# Patient Record
Sex: Female | Born: 1955 | Race: Black or African American | Hispanic: No | Marital: Married | Smoking: Former smoker
Health system: Southern US, Community
[De-identification: ages and names within clinical notes are randomized; demographics above are authoritative.]

## PROBLEM LIST (undated history)

## (undated) DIAGNOSIS — L309 Dermatitis, unspecified: Secondary | ICD-10-CM

## (undated) DIAGNOSIS — I4510 Unspecified right bundle-branch block: Secondary | ICD-10-CM

## (undated) DIAGNOSIS — Z973 Presence of spectacles and contact lenses: Secondary | ICD-10-CM

## (undated) DIAGNOSIS — E119 Type 2 diabetes mellitus without complications: Secondary | ICD-10-CM

## (undated) DIAGNOSIS — N3642 Intrinsic sphincter deficiency (ISD): Secondary | ICD-10-CM

## (undated) DIAGNOSIS — N393 Stress incontinence (female) (male): Secondary | ICD-10-CM

## (undated) DIAGNOSIS — C801 Malignant (primary) neoplasm, unspecified: Secondary | ICD-10-CM

## (undated) DIAGNOSIS — E785 Hyperlipidemia, unspecified: Secondary | ICD-10-CM

## (undated) DIAGNOSIS — Z972 Presence of dental prosthetic device (complete) (partial): Secondary | ICD-10-CM

## (undated) DIAGNOSIS — H269 Unspecified cataract: Secondary | ICD-10-CM

## (undated) DIAGNOSIS — I10 Essential (primary) hypertension: Secondary | ICD-10-CM

## (undated) HISTORY — DX: Hyperlipidemia, unspecified: E78.5

## (undated) HISTORY — PX: TUBAL LIGATION: SHX77

## (undated) HISTORY — PX: COLONOSCOPY: SHX174

## (undated) HISTORY — DX: Type 2 diabetes mellitus without complications: E11.9

## (undated) HISTORY — DX: Unspecified cataract: H26.9

## (undated) HISTORY — DX: Malignant (primary) neoplasm, unspecified: C80.1

## (undated) HISTORY — DX: Essential (primary) hypertension: I10

---

## 1996-11-14 HISTORY — PX: ABDOMINAL HYSTERECTOMY: SHX81

## 1997-11-14 HISTORY — PX: ABDOMINOPLASTY: SUR9

## 1998-08-27 ENCOUNTER — Other Ambulatory Visit: Admission: RE | Admit: 1998-08-27 | Discharge: 1998-08-27 | Payer: Self-pay | Admitting: Obstetrics

## 1998-09-21 ENCOUNTER — Encounter: Payer: Self-pay | Admitting: Obstetrics

## 1998-09-21 ENCOUNTER — Ambulatory Visit (HOSPITAL_COMMUNITY): Admission: RE | Admit: 1998-09-21 | Discharge: 1998-09-21 | Payer: Self-pay | Admitting: Obstetrics

## 1998-10-30 ENCOUNTER — Encounter: Payer: Self-pay | Admitting: Specialist

## 1998-10-30 ENCOUNTER — Ambulatory Visit (HOSPITAL_COMMUNITY): Admission: RE | Admit: 1998-10-30 | Discharge: 1998-10-31 | Payer: Self-pay | Admitting: Specialist

## 1999-10-14 ENCOUNTER — Ambulatory Visit (HOSPITAL_COMMUNITY): Admission: RE | Admit: 1999-10-14 | Discharge: 1999-10-14 | Payer: Self-pay | Admitting: Obstetrics

## 1999-10-14 ENCOUNTER — Encounter: Payer: Self-pay | Admitting: Obstetrics

## 1999-11-03 ENCOUNTER — Other Ambulatory Visit: Admission: RE | Admit: 1999-11-03 | Discharge: 1999-11-03 | Payer: Self-pay | Admitting: Obstetrics

## 1999-12-03 ENCOUNTER — Ambulatory Visit (HOSPITAL_COMMUNITY): Admission: RE | Admit: 1999-12-03 | Discharge: 1999-12-03 | Payer: Self-pay | Admitting: Obstetrics

## 1999-12-03 ENCOUNTER — Encounter: Payer: Self-pay | Admitting: Obstetrics

## 2000-01-05 ENCOUNTER — Ambulatory Visit (HOSPITAL_BASED_OUTPATIENT_CLINIC_OR_DEPARTMENT_OTHER): Admission: RE | Admit: 2000-01-05 | Discharge: 2000-01-05 | Payer: Self-pay | Admitting: Specialist

## 2000-01-05 ENCOUNTER — Encounter (INDEPENDENT_AMBULATORY_CARE_PROVIDER_SITE_OTHER): Payer: Self-pay | Admitting: Specialist

## 2000-01-05 HISTORY — PX: OTHER SURGICAL HISTORY: SHX169

## 2003-08-27 ENCOUNTER — Emergency Department (HOSPITAL_COMMUNITY): Admission: EM | Admit: 2003-08-27 | Discharge: 2003-08-27 | Payer: Self-pay | Admitting: Emergency Medicine

## 2003-12-04 ENCOUNTER — Ambulatory Visit (HOSPITAL_COMMUNITY): Admission: RE | Admit: 2003-12-04 | Discharge: 2003-12-04 | Payer: Self-pay | Admitting: Obstetrics

## 2006-01-14 ENCOUNTER — Emergency Department (HOSPITAL_COMMUNITY): Admission: EM | Admit: 2006-01-14 | Discharge: 2006-01-14 | Payer: Self-pay | Admitting: Emergency Medicine

## 2006-01-15 ENCOUNTER — Emergency Department (HOSPITAL_COMMUNITY): Admission: EM | Admit: 2006-01-15 | Discharge: 2006-01-15 | Payer: Self-pay | Admitting: Emergency Medicine

## 2006-04-09 ENCOUNTER — Inpatient Hospital Stay (HOSPITAL_COMMUNITY): Admission: EM | Admit: 2006-04-09 | Discharge: 2006-04-12 | Payer: Self-pay | Admitting: Emergency Medicine

## 2006-05-18 ENCOUNTER — Encounter: Admission: RE | Admit: 2006-05-18 | Discharge: 2006-08-16 | Payer: Self-pay | Admitting: Cardiology

## 2008-03-27 ENCOUNTER — Observation Stay (HOSPITAL_COMMUNITY): Admission: EM | Admit: 2008-03-27 | Discharge: 2008-03-28 | Payer: Self-pay | Admitting: Emergency Medicine

## 2008-03-27 HISTORY — PX: CARDIOVASCULAR STRESS TEST: SHX262

## 2008-09-11 ENCOUNTER — Ambulatory Visit (HOSPITAL_COMMUNITY): Admission: RE | Admit: 2008-09-11 | Discharge: 2008-09-11 | Payer: Self-pay | Admitting: Obstetrics

## 2008-11-14 DIAGNOSIS — C801 Malignant (primary) neoplasm, unspecified: Secondary | ICD-10-CM

## 2008-11-14 HISTORY — PX: COLON SURGERY: SHX602

## 2008-11-14 HISTORY — DX: Malignant (primary) neoplasm, unspecified: C80.1

## 2008-12-22 ENCOUNTER — Emergency Department (HOSPITAL_COMMUNITY): Admission: EM | Admit: 2008-12-22 | Discharge: 2008-12-22 | Payer: Self-pay | Admitting: Emergency Medicine

## 2008-12-24 ENCOUNTER — Ambulatory Visit (HOSPITAL_BASED_OUTPATIENT_CLINIC_OR_DEPARTMENT_OTHER): Admission: RE | Admit: 2008-12-24 | Discharge: 2008-12-25 | Payer: Self-pay | Admitting: Orthopedic Surgery

## 2008-12-24 HISTORY — PX: OTHER SURGICAL HISTORY: SHX169

## 2009-08-04 ENCOUNTER — Observation Stay (HOSPITAL_COMMUNITY): Admission: EM | Admit: 2009-08-04 | Discharge: 2009-08-06 | Payer: Self-pay | Admitting: Emergency Medicine

## 2009-08-05 ENCOUNTER — Encounter (INDEPENDENT_AMBULATORY_CARE_PROVIDER_SITE_OTHER): Payer: Self-pay | Admitting: Internal Medicine

## 2009-08-05 HISTORY — PX: TRANSTHORACIC ECHOCARDIOGRAM: SHX275

## 2010-03-12 ENCOUNTER — Ambulatory Visit (HOSPITAL_BASED_OUTPATIENT_CLINIC_OR_DEPARTMENT_OTHER): Admission: RE | Admit: 2010-03-12 | Discharge: 2010-03-12 | Payer: Self-pay | Admitting: Orthopedic Surgery

## 2010-03-12 HISTORY — PX: OTHER SURGICAL HISTORY: SHX169

## 2010-10-05 ENCOUNTER — Ambulatory Visit (HOSPITAL_COMMUNITY): Admission: RE | Admit: 2010-10-05 | Discharge: 2010-10-05 | Payer: Self-pay | Admitting: Obstetrics

## 2011-02-01 LAB — BASIC METABOLIC PANEL
BUN: 11 mg/dL (ref 6–23)
Calcium: 9.6 mg/dL (ref 8.4–10.5)
GFR calc non Af Amer: >60 mL/min (ref >60)
Glucose, Bld: 165 mg/dL — ABNORMAL HIGH (ref 70–99)
Potassium: 4.2 mEq/L (ref 3.5–5.1)
Sodium: 137 mEq/L (ref 135–145)

## 2011-02-01 LAB — GLUCOSE, CAPILLARY: Glucose-Capillary: 173 mg/dL — ABNORMAL HIGH (ref 70–99)

## 2011-02-18 LAB — RAPID URINE DRUG SCREEN, HOSP PERFORMED
Barbiturates: NOT DETECTED
Benzodiazepines: NOT DETECTED
Cocaine: NOT DETECTED
Opiates: NOT DETECTED

## 2011-02-18 LAB — POCT I-STAT, CHEM 8
BUN: 11 mg/dL (ref 6–23)
Creatinine, Ser: 0.4 mg/dL (ref 0.4–1.2)
Glucose, Bld: 215 mg/dL — ABNORMAL HIGH (ref 70–99)
Potassium: 4.2 mEq/L (ref 3.5–5.1)
Sodium: 140 mEq/L (ref 135–145)

## 2011-02-18 LAB — CBC
MCHC: 33.4 g/dL (ref 30.0–36.0)
MCV: 90.9 fL (ref 78.0–100.0)
Platelets: 235 10*3/uL (ref 150–400)
WBC: 8.5 10*3/uL (ref 4.0–10.5)

## 2011-02-18 LAB — GLUCOSE, CAPILLARY
Glucose-Capillary: 136 mg/dL — ABNORMAL HIGH (ref 70–99)
Glucose-Capillary: 164 mg/dL — ABNORMAL HIGH (ref 70–99)
Glucose-Capillary: 167 mg/dL — ABNORMAL HIGH (ref 70–99)

## 2011-02-18 LAB — CK TOTAL AND CKMB (NOT AT ARMC)
CK, MB: 1 ng/mL (ref 0.3–4.0)
Relative Index: 0.9 (ref 0.0–2.5)

## 2011-02-18 LAB — BASIC METABOLIC PANEL
CO2: 27 mEq/L (ref 19–32)
GFR calc non Af Amer: >60 mL/min (ref >60)
Glucose, Bld: 152 mg/dL — ABNORMAL HIGH (ref 70–99)
Potassium: 4.4 mEq/L (ref 3.5–5.1)
Sodium: 140 mEq/L (ref 135–145)

## 2011-02-18 LAB — COMPREHENSIVE METABOLIC PANEL
AST: 19 U/L (ref 0–37)
Albumin: 3.6 g/dL (ref 3.5–5.2)
Calcium: 9.3 mg/dL (ref 8.4–10.5)
Creatinine, Ser: 0.76 mg/dL (ref 0.4–1.2)
GFR calc Af Amer: >60 mL/min (ref >60)
Total Protein: 7.4 g/dL (ref 6.0–8.3)

## 2011-02-18 LAB — LIPID PANEL
HDL: 35 mg/dL — ABNORMAL LOW (ref >39)
Triglycerides: 127 mg/dL (ref <150)
VLDL: 25 mg/dL (ref 0–40)

## 2011-02-18 LAB — HEMOGLOBIN A1C
Hgb A1c MFr Bld: 7.6 % — ABNORMAL HIGH (ref 4.6–6.1)
Mean Plasma Glucose: 171 mg/dL

## 2011-02-18 LAB — TSH: TSH: 0.725 u[IU]/mL (ref 0.350–4.500)

## 2011-02-18 LAB — CARDIAC PANEL(CRET KIN+CKTOT+MB+TROPI)
CK, MB: 0.7 ng/mL (ref 0.3–4.0)
Relative Index: 0.8 (ref 0.0–2.5)

## 2011-02-18 LAB — TROPONIN I: Troponin I: 0.04 ng/mL (ref 0.00–0.06)

## 2011-03-01 LAB — POCT I-STAT, CHEM 8
Calcium, Ion: 1.21 mmol/L (ref 1.12–1.32)
Chloride: 102 mEq/L (ref 96–112)
HCT: 39 % (ref 36.0–46.0)
Hemoglobin: 13.3 g/dL (ref 12.0–15.0)

## 2011-03-01 LAB — GLUCOSE, CAPILLARY
Glucose-Capillary: 120 mg/dL — ABNORMAL HIGH (ref 70–99)
Glucose-Capillary: 168 mg/dL — ABNORMAL HIGH (ref 70–99)

## 2011-03-29 NOTE — Discharge Summary (Signed)
Gabrielle [name], [name]              ACCOUNT NO.:  [id number]   MEDICAL RECORD NO.:  [id number]          PATIENT TYPE:  INP   LOCATION:  4741                         FACILITY:  [facility]   PHYSICIAN:  Mohan N. Sharyn Lull, M.D. DATE OF BIRTH:  1956/07/11   DATE OF ADMISSION:  03/27/2008  DATE OF DISCHARGE:  03/28/2008                               DISCHARGE SUMMARY   ADMITTING DIAGNOSES:  1. Chest pain rule out myocardial infarction.  2. Hypertension.  3. Insulin-requiring diabetes mellitus.  4. Hypercholesteremia.  5. History of tobacco abuse.   FINAL DIAGNOSES:  1. Status post chest pain, myocardial infarction ruled out, negative      Persantine Myoview.  2. Rule out gastroesophageal reflux disease.  3. Hypertension.  4. Insulin-requiring diabetes mellitus.  5. Hypercholesteremia.  6. History of tobacco abuse.   DISCHARGE HOME MEDICATIONS:  1. Enteric-coated aspirin 81 mg 1 tablet daily.  2. Cozaar 100 mg 1 tablet daily.  3. Metformin 500 mg 1 tablet twice daily with food.  4. Lantus insulin 15 units at night as before.  5. Simcor 500/20 one tablet twice daily.  6. Nitrostat 0.4 mg sublingual use as directed.  7. Prilosec 20 mg 1 daily.   DIET:  Low salt, low-cholesterol, 1800 calories ADA diet.   ACTIVITY:  As tolerated.   FOLLOWUP:  Follow up With Dr. Shana Chute in 1 week.   CONDITION ON DISCHARGE:  Stable.   BRIEF HISTORY AND HOSPITAL COURSE:  [name] is 55 year old black  female with past medical history significant for hypertension, insulin-  requiring diabetes mellitus, hypercholesteremia, history of tobacco  abuse.  She came to the ER from the primary care complaining of  retrosternal chest pressure grade 9/10 associated with nausea and mild  shortness of breath.  Chest pain was localized while at work, received 3  sublingual nitro at primary care with partial relief.  EKG showed normal  sinus rhythm with right bundle branch block pattern and was transferred  here  for further evaluation.  The patient denies any history of such  pain in the past.  Denies exertional chest pain but complains of  exertional dyspnea.  Denies any palpitation, lightheadedness, or  syncope.  Denies PND, orthopnea, leg swelling.  Denies relation of chest  pain to food, breathing, or movement.   PAST MEDICAL HISTORY:  As above.   PAST SURGICAL HISTORY:  She had hysterectomy in the past, had  liposuction in the past.   ALLERGIES:  NO KNOWN DRUG ALLERGIES.   MEDICATION:  At home, she is on Cozaar 100 mg p.o. daily, Vytorin 10/20  p.o. daily, metformin 500 mg p.o. b.i.d., Lantus insulin 15 units daily  at night.   SOCIAL HISTORY:  She is married, has 1 child.  Smoked 1 pack per day for  30+ years, quit last year.  No history of alcohol or drug abuse.  Works  at [employer], is in Architect.   FAMILY HISTORY:  Father is alive, he is 43; mother is alive, she is  diabetic in her 18s.   PHYSICAL EXAMINATION:  GENERAL:  On  examination, she is alert, awake,  oriented x3 in no acute distress.  VITAL SIGNS:  Blood pressure was 139/79, pulse was 71 regular.  HEENT:  Conjunctivae was pink.  NECK:  Supple.  No JVD.  No bruit.  LUNGS:  Clear to auscultation without rhonchi or rales.  CARDIOVASCULAR:  S1 and S2 was normal.  There was soft systolic murmur.  There was no S3 gallop.  ABDOMEN:  Soft.  Bowel sounds are present, nontender.  EXTREMITIES:  There is no clubbing, cyanosis, or edema.   LABORATORY DATA:  EKG showed normal sinus rhythm with right bundle  branch block pattern and nonspecific ST-T wave changes.  Her 2 sets of  cardiac enzymes were negative.  Hemoglobin was 11.9, hematocrit 35.7,  white count 10.3.  Cholesterol was 136, triglyceride 299, HDL was low at  23, LDL 53, potassium this morning was 3.5, BUN 12, creatinine 0.90,  glucose 135, hemoglobin A1c was 6.8.   BRIEF HOSPITAL COURSE:  The patient was admitted to telemetry  unit.  She  was started on IV heparin and nitrates and aspirin and low-dose beta-  blockers.  The patient's MI was ruled out by serial enzymes and EKG.  The patient subsequently underwent Persantine Myoview, which showed no  evidence of reversible ischemia with normal EF.  The patient has been  ambulating in the hallway without any problems.  Her EKG remained  stable.  The patient will be discharged home on above medications and  will be followed up by Dr. Shana Chute in 1 week.  We will get GI evaluation  if she continues to have recurrent chest pain.      Eduardo Osier. Sharyn Lull, M.D.  Electronically Signed     MNH/MEDQ  D:  03/28/2008  T:  03/29/2008  Job:  [id number]   cc:   Osvaldo Shipper. Spruill, M.D.

## 2011-03-29 NOTE — Op Note (Signed)
NAME:  Gabrielle [name], Gabrielle [name]              ACCOUNT NO.:  [id number]   MEDICAL RECORD NO.:  [id number]          PATIENT TYPE:  AMB   LOCATION:  DSC                          FACILITY:  [facility]   PHYSICIAN:  Harvie Junior, M.D.   DATE OF BIRTH:  1956-09-02   DATE OF PROCEDURE:  12/24/2008  DATE OF DISCHARGE:                               OPERATIVE REPORT   PREOPERATIVE DIAGNOSIS:  Ankle fracture dislocation, right, status post  closed reduction.   POSTOPERATIVE DIAGNOSIS:  Ankle fracture dislocation, right, status post  closed reduction.   PRINCIPAL PROCEDURE:  Open reduction and internal fixation of  bimalleolar ankle fracture with fixation of the lateral malleolus.   SURGEON:  Harvie Junior, M.D.   ASSISTANT:  Marshia Ly, P.A.   ANESTHESIA:  General.   BRIEF HISTORY:  Ms. Line is a 55 -year-old female with history of  having slip and fall.  She suffered a fracture dislocation of the ankle.  She was seen in the [facility] Emergency Room where they did a closed  reduction, which gave near anatomic reduction of the ankle.  The patient  was evaluated in our office and felt to have an obvious fracture  dislocation, unstable ankle, and because of this she was felt to need  open reduction and internal fixation.  She was brought to the operating  room for this procedure.   PROCEDURE:  The patient was brought to the operating room.  After  adequate anesthesia obtained with general anesthetic, the patient was  placed supine on the operating table.  The right leg was then prepped  and draped in usual sterile fashion.  The initial thing we did was  really look at length for medial malleolar issues.  All her injury films  and her postreduction films, we really could not see anything in the  medial malleolus, but we took the fluoro and really spent some time  evaluating the medial malleolus and it did not look to be fractured.  At  that point, we exsanguinated the leg and inflated the blood  pressure  tourniquet to 350 mmHg.  Following this, a lateral incision was made.  Subcutaneous tissue was taken down to the level of the fibula.  The  fibula was identified.  The fracture line was identified, cleansed of  all the initial healing elements.  Anatomic reduction was obtained, held  with the clamp.  We did an interfragmentary screw and then a lateral  five-hole plate and got anatomic fixation of lateral malleolus.  Then  again took fluoro images.  The screw lengths were fine.  The fibula was  reduced nicely.  Posterior malleolus reduced anatomically and again we  looked at length on the medial side and did not see any evidence of  medial malleolar fracture.  At this point, the wound  was copiously and  thoroughly irrigated and suctioned dry.  The skin was closed with 3-0  Vicryl and 4-0 nylon interrupted sutures.  Sterile compressive dressing  was applied as well as a U and a posterior splint.  The patient was  taken to the recovery room  and was noted to be in the satisfactory  condition.  Estimated blood loss throughout the procedure was none.   Of note, Marshia Ly, was present throughout the case as assistance in  manipulation of the ankle and holding of the alignment, so the screws  could be placed was invaluable.  He also closed the case to minimize the  operative time.      Harvie Junior, M.D.  Electronically Signed     JLG/MEDQ  D:  12/24/2008  T:  12/24/2008  Job:  [id number]

## 2011-04-01 NOTE — Op Note (Signed)
[place]  Patient:    Gabrielle [name], Gabrielle [name]                     MRN: [id number] Proc. Date: 01/05/00 Adm. Date:  [id number] Attending:  Gustavus Messing CC:         Yaakov Guthrie. Shon Hough, M.D. x 2                           Operative Report  PREOPERATIVE DIAGNOSIS:  POSTOPERATIVE DIAGNOSIS:  PROCEDURE:  Excision of scar cicatrix of the lower abdomen and then advancement  closure.  SURGEON:  Yaakov Guthrie. Shon Hough, M.D.  ANESTHESIA:  General.  INDICATIONS:  This is a 54 year old lady who is status post abdominoplasty several months ago.  Developed postoperative dehiscence of the lower part of the abdomen with ulceration secondary to cigarette smoking.  The patient went through a long drawn out period of dressing changes and allowed the wound to heal in secondarily. As a result, the wound shows now increased scar cicatrix, indentation, and deformity of the abdomen secondary to the previous secondary healing.  It has also caused increased discomfort, especially when she wears her clothing against the  area and when she sits.  DESCRIPTION OF PROCEDURE:  The patient underwent general anesthesia and intubated orally.  Prep was done to the abdominal area and groin using Betadine soap and solution, and walled off with sterile towels and drapes so as to make a sterile  field.  A marking pen was used to outline the scar cicatrix which measured approximately 6-8 cm in width and more than maybe 12 cm to 14 cm in length.  A ide excision was made down to underlying fascia.  Hemostasis was maintained with the Bovie unit on coagulation.  After proper hemostasis, the anterior flaps and superior flaps were freed up approximately 8 cm and the lower flap was also freed up to allow closure without increased tension with 2-0 Monocryl x 2 layers, and then a running subcuticular  stitch of 3-0 Monocryl.  Half inch Steri-Strips and silicone gel patch  were applied, Xeroform, 4 x 4s, ABDs, Hyperfix tape, and abdominal support.  She withstood the procedures very well and was taken to recovery in good condition. DD:  01/05/00 TD:  01/06/00 Job: [id number]

## 2011-04-01 NOTE — Discharge Summary (Signed)
NAMEDANELY, BAYLISS              ACCOUNT NO.:  [id number]   MEDICAL RECORD NO.:  [id number]          PATIENT TYPE:  INP   LOCATION:  1426                         FACILITY:  [facility]   PHYSICIAN:  Osvaldo Shipper. Spruill, M.D.DATE OF BIRTH:  1955/12/25   DATE OF ADMISSION:  04/09/2006  DATE OF DISCHARGE:  04/12/2006                                 DISCHARGE SUMMARY   AGE:  55.   ADMISSION DIAGNOSES:  1. Diabetic coma type 2 new onset.  2. Dehydration.  3. Hypertension.  4. Tobacco abuse.   DISCHARGE DIAGNOSES:  1. Uncontrolled type 2 diabetes.  2. Dehydration.  3. Hypertension.  4. Tobacco disorder.  5. Obesity.   BRIEF HISTORY:  This 55 year old black woman who has not been seen by me for  several years apparently was at home and she became weak, went to the  bathroom and passed out on the floor. The patient also reported that she had  been drinking water for the past 2 weeks prior to admission. She denied any  chest pain or abdominal pain. She has a past medical history of diabetes  diagnosed some 10-15 years ago but was not treated. She has also a history  of hypertension and a positive history of smoking. She came to the [facility] Emergency Room. She was brought into observation by Dr. Orpah Cobb in  my absence and subsequently admitted to the hospital for treatment new onset  diabetes. It was discovered in the emergency room that the patient's blood  sugar was greater than 710. She will be admitted to the hospital for further  evaluation and treatment.   LABORATORY DATA:  Laboratory studies revealed the following:  On initial  presentation urinalysis color was yellow hazy. Specific gravity of the urine  1.038, pH is 5.5, glucose greater than 1000, moderate amount of hemoglobin.  Ketones was positive, many epithelial cells also noted. The patient had a  urine culture that only revealed 50,000 colonies. In addition to that, lipid  profile obtained on Apr 12, 2006 revealed  a total cholesterol of 226,  triglycerides of 344, the low density lipoprotein cholesterol was 130.  Cardiac enzymes were obtained on May 26 and revealed a CK of 79, CK-MB of  0.9. Troponin was 0.07. The patient had the routine chemistry performed and  revealed an AST of 14, ALT of 17, ALP of 126, total bilirubin of 1.3. White  blood cell count on initial presentation was 10,700, it decreased on Apr 10, 2006 to 8400. Hemoglobin 15.4, hematocrit 12.7 at discharge. Serum sodium on  admission was 134, 135 at discharge. Potassium was 4.7 on admission, 3.4 at  discharge. Chloride 98 on admission, 103 at discharge. Glucose again was 710  on admission, near 298 at discharge. BUN and creatinine 13 and 1.2  respectively. The patient's radiologic studies included an unremarkable  chest x-ray and an EKG also revealed normal sinus rhythm with a right bundle  branch block and there had been no change since the previous EKG of 1999.   HOSPITAL COURSE:  The patient was admitted to the hospital by Dr.  Orpah Cobb in my absence. After admission to the hospital, baseline laboratory  studies were obtained and included markedly elevated blood sugar on initial  presentation. The patient was placed on sliding scale insulin coverage with  moderate insulin coverage. No nighttime coverage was requested. She was  given IV normal saline for hydration purposes. In addition to this, she was  started on Glucotrol XL 10 mg once daily, given Protonix 40 mg daily,  Tekturna 150 mg 1 daily, Glucophage 500 mg p.o. twice a day. Diabetic  teaching was also requested.   The patient had some minor difficulty with insomnia and was prescribed  Rozerem and also started on Lantus insulin 10 units subcu h.s.   Throughout her hospital course, her blood sugars decreased. She became  reacquainted with using insulin and her diet. It was felt the patient had  reached maximum hospital benefit. The patient will be discharged home  and  will be followed back in my office in approximately 2 weeks for control of  her diabetes.   In brief summary, this 55 year old black woman who lives alone who has a  history of diabetes untreated presented to the hospital in a coma with  ketoacidosis. She was treated effectively, put back on insulin and oral  agents and released home in good condition.   DISCHARGE MEDICATIONS:  1. Vytorin 10/20 mg p.o. daily.  2. Lantus insulin 15 units subcu q.p.m.  3. Glucotrol XL 10 mg p.o. q.a.m.  4. Cozaar 100 mg p.o. daily.  5. Glucophage 500 mg p.o. b.i.d.  6. Protonix 40 mg p.o. q.a.m.  7. Cipro 500 mg p.o. b.i.d.   She will be in the office in 2 weeks. She will be also on the moderate  carbohydrate restricted diet. She will have followup with diabetes  outpatient clinic.      Osvaldo Shipper. Spruill, M.D.  Electronically Signed     JOS/MEDQ  D:  07/18/2006  T:  07/19/2006  Job:  [id number]

## 2011-08-10 LAB — CARDIAC PANEL(CRET KIN+CKTOT+MB+TROPI)
CK, MB: 0.9
Relative Index: 0.8

## 2011-08-10 LAB — LIPID PANEL
Cholesterol: 136
HDL: 23 — ABNORMAL LOW
LDL Cholesterol: 53
Triglycerides: 299 — ABNORMAL HIGH

## 2011-08-10 LAB — BASIC METABOLIC PANEL
BUN: 12
CO2: 27
Calcium: 9.5
Creatinine, Ser: 0.9
GFR calc Af Amer: >60
Glucose, Bld: 135 — ABNORMAL HIGH

## 2011-08-10 LAB — CBC
HCT: 35.3 — ABNORMAL LOW
Hemoglobin: 11.9 — ABNORMAL LOW
MCHC: 33.6
MCV: 88.7
RBC: 3.98
WBC: 10.3

## 2011-11-14 ENCOUNTER — Ambulatory Visit (INDEPENDENT_AMBULATORY_CARE_PROVIDER_SITE_OTHER): Payer: BC Managed Care – PPO

## 2011-11-14 DIAGNOSIS — I10 Essential (primary) hypertension: Secondary | ICD-10-CM

## 2011-11-14 DIAGNOSIS — E782 Mixed hyperlipidemia: Secondary | ICD-10-CM

## 2011-11-14 DIAGNOSIS — E119 Type 2 diabetes mellitus without complications: Secondary | ICD-10-CM

## 2012-02-09 ENCOUNTER — Telehealth: Payer: Self-pay

## 2012-02-09 NOTE — Telephone Encounter (Signed)
Pt is requesting glucose strips be called into [facility] - states we sent it there - they have NO record of this at their site Please call patient and advise how to get her strips  [phone]

## 2012-02-14 NOTE — Telephone Encounter (Signed)
Checked with pt to verify what she needs and called in Rxs for BS glucose meter and testing strips of pt's choice - testing strips #100 plus two add'l RFs to [facility]

## 2012-02-14 NOTE — Telephone Encounter (Signed)
THIS IS THE SECOND CALL FROM PT. IS REQUESTING THE GLUCOSE STRIPS ALONG WITH THE MACHINE. HASN'T HEARD FROM ANYONE. PLEASE CALL [phone]   [facility] ON [address]

## 2012-02-28 ENCOUNTER — Ambulatory Visit (INDEPENDENT_AMBULATORY_CARE_PROVIDER_SITE_OTHER): Payer: BC Managed Care – PPO | Admitting: Family Medicine

## 2012-02-28 DIAGNOSIS — J301 Allergic rhinitis due to pollen: Secondary | ICD-10-CM

## 2012-02-28 DIAGNOSIS — E119 Type 2 diabetes mellitus without complications: Secondary | ICD-10-CM | POA: Insufficient documentation

## 2012-02-28 DIAGNOSIS — I10 Essential (primary) hypertension: Secondary | ICD-10-CM | POA: Insufficient documentation

## 2012-02-28 DIAGNOSIS — E7800 Pure hypercholesterolemia, unspecified: Secondary | ICD-10-CM | POA: Insufficient documentation

## 2012-02-28 LAB — POCT GLYCOSYLATED HEMOGLOBIN (HGB A1C): Hemoglobin A1C: 5.9

## 2012-02-28 MED ORDER — OLOPATADINE HCL 0.1 % OP SOLN: 1.0000 [drp] | Freq: Two times a day (BID) | OPHTHALMIC | Status: AC

## 2012-02-28 MED ORDER — FLUTICASONE PROPIONATE 50 MCG/ACT NA SUSP: 2.0000 | Freq: Every day | NASAL | Status: DC

## 2012-02-28 NOTE — Progress Notes (Signed)
  Patient Name: Gabrielle [name] Date of Birth: 05-22-1956 Medical Record Number: [id number] Gender: female Date of Encounter: 02/28/2012  History of Present Illness:  Gabrielle [name] is a 56 y.o. very pleasant female patient who presents with the following:  Here today with allergy symptoms- eyes running, sneezing, coughing.  No fever or aches, mild chills.  Runny and stuffy nose, itchy nose and throat.  No GI symptoms or other sign of illness.    So far she has not tried any medications for allergies.    Patient Active Problem List  Diagnoses  . Diabetes mellitus  . High cholesterol  . Hypertension   Past Medical History  Diagnosis Date  . Lactose intolerance    Past Surgical History  Procedure Date  . Abdominal hysterectomy 1998    fibroids  . Tummy tuck 1999  . Ankle repari 2010   History  Substance Use Topics  . Smoking status: Former Smoker    Quit date: 11/14/2006  . Smokeless tobacco: Never Used  . Alcohol Use: No   Family History  Problem Relation Age of Onset  . Diabetes Mother   . Diabetes Sister   . Diabetes Brother    No Known Allergies  Medication list has been reviewed and updated.  Review of Systems: As per HPI- otherwise negative.   Physical Examination: Filed Vitals:   02/28/12 1136  BP: 119/74  Pulse: 82  Temp: 98.4 F (36.9 C)  TempSrc: Oral  Resp: 18  Weight: 135 lb 9.6 oz (61.508 kg)    There is no height on file to calculate BMI.  GEN: WDWN, NAD, Non-toxic, A & O x 3 HEENT: Atraumatic, Normocephalic. Neck supple. No masses, No LAD.  Swelling around eyes/ allergic shiners.  TM, oropharynx wnl Ears and Nose: No external deformity. CV: RRR, No M/G/R. No JVD. No thrill. No extra heart sounds. PULM: CTA B, no wheezes, crackles, rhonchi. No retractions. No resp. distress. No accessory muscle use. ABD: S, NT, ND, +BS. No rebound. No HSM. EXTR: No c/c/e NEURO Normal gait.  PSYCH: Normally interactive. Conversant. Not depressed or  anxious appearing.  Calm demeanor.   Results for orders placed in visit on 02/28/12  POCT GLYCOSYLATED HEMOGLOBIN (HGB A1C)      Component Value Range   Hemoglobin A1C 5.9       Assessment and Plan: 1. Diabetes mellitus  POCT glycosylated hemoglobin (Hb A1C)  2. High cholesterol    3. Hypertension    4. Allergic rhinitis  olopatadine (PATANOL) 0.1 % ophthalmic solution, fluticasone (FLONASE) 50 MCG/ACT nasal spray   Treat AR as above- DM looks great.  Plan recheck here in about 2 months for fasting labs

## 2012-04-14 ENCOUNTER — Ambulatory Visit (INDEPENDENT_AMBULATORY_CARE_PROVIDER_SITE_OTHER): Payer: BC Managed Care – PPO | Admitting: Family Medicine

## 2012-04-14 VITALS — BP 116/76 | HR 84 | Temp 98.5°F | Resp 16 | Ht <= 58 in | Wt 138.2 lb

## 2012-04-14 DIAGNOSIS — R300 Dysuria: Secondary | ICD-10-CM

## 2012-04-14 DIAGNOSIS — L241 Irritant contact dermatitis due to oils and greases: Secondary | ICD-10-CM

## 2012-04-14 DIAGNOSIS — L309 Dermatitis, unspecified: Secondary | ICD-10-CM

## 2012-04-14 DIAGNOSIS — N390 Urinary tract infection, site not specified: Secondary | ICD-10-CM

## 2012-04-14 LAB — POCT UA - MICROSCOPIC ONLY
Casts, Ur, LPF, POC: NEGATIVE
Crystals, Ur, HPF, POC: NEGATIVE
Yeast, UA: NEGATIVE

## 2012-04-14 LAB — POCT URINALYSIS DIPSTICK
Glucose, UA: NEGATIVE
Nitrite, UA: NEGATIVE
Urobilinogen, UA: 1
pH, UA: 6

## 2012-04-14 MED ORDER — CLOBETASOL PROPIONATE 0.05 % EX OINT: TOPICAL_OINTMENT | Freq: Two times a day (BID) | CUTANEOUS | Status: DC

## 2012-04-14 MED ORDER — FLUCONAZOLE 150 MG PO TABS: 150.0000 mg | ORAL_TABLET | Freq: Once | ORAL | Status: DC

## 2012-04-14 MED ORDER — CIPROFLOXACIN HCL 250 MG PO TABS: 250.0000 mg | ORAL_TABLET | Freq: Two times a day (BID) | ORAL | Status: AC

## 2012-04-14 MED ORDER — CLOBETASOL PROP EMOLLIENT BASE 0.05 % EX CREA: TOPICAL_CREAM | CUTANEOUS | Status: DC

## 2012-04-14 NOTE — Progress Notes (Signed)
Patient Name: Gabrielle [name] Date of Birth: [birth year] Medical Record Number: [id number] Gender: female Date of Encounter: 04/14/2012  History of Present Illness:  Gabrielle [name] is a 56 y.o. very pleasant female patient who presents with the following:  Here today to evaluate a rash and dysuria. She has noted a "skin infection" on her fingers- she has a history of eczema and her dermatologist has retired.  She used clobetazole ointment AND cream in the past which helped. Her rash is currently flared up as she has been working in water a lot  She is also concerned about dysuria, urgency,and frequency.  These symptoms have been going on for a couple of days.  No fever, nausea, vomiting or back pain, no vaginal symptoms.  hysterectomy  Patient Active Problem List  Diagnoses  . Diabetes mellitus  . High cholesterol  . Hypertension   Past Medical History  Diagnosis Date  . Lactose intolerance    Past Surgical History  Procedure Date  . Abdominal hysterectomy 1998    fibroids  . Tummy tuck 1999  . Ankle repari 2010   History  Substance Use Topics  . Smoking status: Former Smoker    Quit date: 11/14/2006  . Smokeless tobacco: Never Used  . Alcohol Use: No   Family History  Problem Relation Age of Onset  . Diabetes Mother   . Diabetes Sister   . Diabetes Brother    No Known Allergies  Medication list has been reviewed and updated.  Prior to Admission medications   Medication Sig Start Date End Date Taking? Authorizing Provider  amLODipine-benazepril (LOTREL) 10-20 MG per capsule Take 1 capsule by mouth daily.   Yes Historical Provider, MD  atorvastatin (LIPITOR) 10 MG tablet Take 10 mg by mouth daily.   Yes Historical Provider, MD  insulin glargine (LANTUS) 100 UNIT/ML injection Inject 15 Units into the skin at bedtime.   Yes Historical Provider, MD  lisinopril (PRINIVIL,ZESTRIL) 10 MG tablet Take 10 mg by mouth daily.   Yes Historical Provider, MD  metFORMIN  (GLUCOPHAGE) 1000 MG tablet Take 1,000 mg by mouth 2 (two) times daily with a meal.   Yes Historical Provider, MD  fluticasone (FLONASE) 50 MCG/ACT nasal spray Place 2 sprays into the nose daily. 02/28/12 02/27/13  Gwenlyn Found Amorita Vanrossum, MD  olopatadine (PATANOL) 0.1 % ophthalmic solution Place 1 drop into both eyes 2 (two) times daily. 02/28/12 02/27/13  Pearline Cables, MD    Review of Systems:  As per HPI- otherwise negative.   Physical Examination: Filed Vitals:   04/14/12 1214  BP: 116/76  Pulse: 84  Temp: 98.5 F (36.9 C)  Resp: 16   Filed Vitals:   04/14/12 1214  Height: 4\' 10"  (1.473 m)  Weight: 138 lb 3.2 oz (62.687 kg)   Body mass index is 28.88 kg/(m^2).  GEN: WDWN, NAD, Non-toxic, A & O x 3 HEENT: Atraumatic, Normocephalic. Neck supple. No masses, No LAD. Ears and Nose: No external deformity. CV: RRR, No M/G/R. No JVD. No thrill. No extra heart sounds. PULM: CTA B, no wheezes, crackles, rhonchi. No retractions. No resp. distress. No accessory muscle use. ABD: S, NT, ND, +BS. No rebound. No HSM.  No CVA tenderness EXTR: No c/c/e.  Hands with patches of severe dryness and splitting typical of eczema.   NEURO Normal gait.  PSYCH: Normally interactive. Conversant. Not depressed or anxious appearing.  Calm demeanor.   Results for orders placed in visit on 04/14/12  POCT URINALYSIS DIPSTICK  Component Value Range   Color, UA yellow     Clarity, UA cloudy     Glucose, UA neg     Bilirubin, UA neg     Ketones, UA trace     Spec Grav, UA 1.025     Blood, UA mod     pH, UA 6.0     Protein, UA trace     Urobilinogen, UA 1.0     Nitrite, UA neg     Leukocytes, UA large (3+)    POCT UA - MICROSCOPIC ONLY      Component Value Range   WBC, Ur, HPF, POC 15-25     RBC, urine, microscopic 10-15     Bacteria, U Microscopic small     Mucus, UA neg     Epithelial cells, urine per micros 3-6     Crystals, Ur, HPF, POC neg     Casts, Ur, LPF, POC neg     Yeast, UA neg      Assessment and Plan: 1. Dysuria  POCT urinalysis dipstick, POCT UA - Microscopic Only, ciprofloxacin (CIPRO) 250 MG tablet, fluconazole (DIFLUCAN) 150 MG tablet, Urine culture  2. Eczema  clobetasol ointment (TEMOVATE) 0.05 %, Clobetasol Prop Emollient Base (CLOBETASOL PROPIONATE E) 0.05 % emollient cream   Treat as above.  Let me know if UTI symptoms not better in a couple of days- Sooner if worse.   Will plan further follow- up pending labs.   Abbe Amsterdam, MD 04/14/2012 12:46 PM

## 2012-04-16 ENCOUNTER — Encounter: Payer: Self-pay | Admitting: Family Medicine

## 2012-04-16 LAB — URINE CULTURE: Colony Count: 75000

## 2012-04-17 ENCOUNTER — Telehealth: Payer: Self-pay

## 2012-04-17 NOTE — Telephone Encounter (Signed)
PT STATES SHE HAD SEEN DR COPLAND AND HAD A CULTURE DONE, WOULD LIKE TO KNOW IF RESULTS ARE BACK PLEASE CALL [phone]

## 2012-04-18 NOTE — Telephone Encounter (Signed)
Called and let her know

## 2012-10-26 ENCOUNTER — Ambulatory Visit (INDEPENDENT_AMBULATORY_CARE_PROVIDER_SITE_OTHER): Payer: BC Managed Care – PPO | Admitting: Family Medicine

## 2012-10-26 VITALS — BP 115/71 | HR 83 | Temp 98.5°F | Resp 16 | Ht <= 58 in | Wt 138.8 lb

## 2012-10-26 DIAGNOSIS — R300 Dysuria: Secondary | ICD-10-CM

## 2012-10-26 DIAGNOSIS — IMO0001 Reserved for inherently not codable concepts without codable children: Secondary | ICD-10-CM

## 2012-10-26 DIAGNOSIS — I10 Essential (primary) hypertension: Secondary | ICD-10-CM

## 2012-10-26 DIAGNOSIS — E119 Type 2 diabetes mellitus without complications: Secondary | ICD-10-CM

## 2012-10-26 DIAGNOSIS — N318 Other neuromuscular dysfunction of bladder: Secondary | ICD-10-CM

## 2012-10-26 DIAGNOSIS — E785 Hyperlipidemia, unspecified: Secondary | ICD-10-CM

## 2012-10-26 DIAGNOSIS — R350 Frequency of micturition: Secondary | ICD-10-CM

## 2012-10-26 DIAGNOSIS — N3281 Overactive bladder: Secondary | ICD-10-CM

## 2012-10-26 LAB — POCT UA - MICROSCOPIC ONLY
Bacteria, U Microscopic: NEGATIVE
Casts, Ur, LPF, POC: NEGATIVE
Crystals, Ur, HPF, POC: NEGATIVE
Mucus, UA: NEGATIVE
Yeast, UA: NEGATIVE

## 2012-10-26 LAB — COMPREHENSIVE METABOLIC PANEL WITH GFR
Albumin: 4.5 g/dL (ref 3.5–5.2)
CO2: 26 meq/L (ref 19–32)
Glucose, Bld: 260 mg/dL — ABNORMAL HIGH (ref 70–99)
Potassium: 4.2 meq/L (ref 3.5–5.3)
Sodium: 137 meq/L (ref 135–145)
Total Protein: 8.4 g/dL — ABNORMAL HIGH (ref 6.0–8.3)

## 2012-10-26 LAB — COMPREHENSIVE METABOLIC PANEL
ALT: 22 U/L (ref 0–35)
AST: 26 U/L (ref 0–37)
Alkaline Phosphatase: 112 U/L (ref 39–117)
BUN: 15 mg/dL (ref 6–23)
Calcium: 10.5 mg/dL (ref 8.4–10.5)
Chloride: 99 mEq/L (ref 96–112)
Creat: 0.73 mg/dL (ref 0.50–1.10)
Total Bilirubin: 0.3 mg/dL (ref 0.3–1.2)

## 2012-10-26 LAB — POCT URINALYSIS DIPSTICK
Bilirubin, UA: NEGATIVE
Glucose, UA: 500
Ketones, UA: NEGATIVE
Leukocytes, UA: NEGATIVE
Nitrite, UA: NEGATIVE
Spec Grav, UA: 1.025
Urobilinogen, UA: 0.2
pH, UA: 6.5

## 2012-10-26 LAB — TSH: TSH: 1.01 u[IU]/mL (ref 0.350–4.500)

## 2012-10-26 LAB — LDL CHOLESTEROL, DIRECT: Direct LDL: 144 mg/dL — ABNORMAL HIGH

## 2012-10-26 LAB — POCT GLYCOSYLATED HEMOGLOBIN (HGB A1C): Hemoglobin A1C: 6.6

## 2012-10-26 MED ORDER — FLUCONAZOLE 150 MG PO TABS: 150.0000 mg | ORAL_TABLET | Freq: Once | ORAL | Status: DC

## 2012-10-26 MED ORDER — ATORVASTATIN CALCIUM 10 MG PO TABS: 10.0000 mg | ORAL_TABLET | Freq: Every day | ORAL | Status: DC

## 2012-10-26 MED ORDER — LISINOPRIL 10 MG PO TABS: 10.0000 mg | ORAL_TABLET | Freq: Every day | ORAL | Status: DC

## 2012-10-26 MED ORDER — AMLODIPINE BESYLATE 10 MG PO TABS: 10.0000 mg | ORAL_TABLET | Freq: Every day | ORAL | Status: DC

## 2012-10-26 MED ORDER — CIPROFLOXACIN HCL 250 MG PO TABS: 250.0000 mg | ORAL_TABLET | Freq: Two times a day (BID) | ORAL | Status: DC

## 2012-10-26 MED ORDER — METFORMIN HCL 1000 MG PO TABS: 1000.0000 mg | ORAL_TABLET | Freq: Two times a day (BID) | ORAL | Status: DC

## 2012-10-26 MED ORDER — INSULIN GLARGINE 100 UNIT/ML ~~LOC~~ SOLN: SUBCUTANEOUS | Status: DC

## 2012-10-26 NOTE — Progress Notes (Addendum)
[facility]:  Office Visit  Chief Complaint:  Chief Complaint  Patient presents with  . Urinary Tract Infection    frequent urination, with pain  . Medication Refill    HPI: Gabrielle [name] is a 56 y.o. female who complains of:  1. Increase frequency and dysuria. Does not empty bladder all the time. She does not have the finances to get Detrol. Deneis fevers, chills, nausea, vomiting, abd pain, pelvic pain. Wears a pantyliner. Has h/o overactive bladder and DM.   2. Diabetes-well controlled last HbA1c was 5.9 in April. She checks sugars at night usually between 90-140. Has had possible hypoglycemia episode, feels jittery when her sugar goes down to 90. Ocassional numbness and tingling. Eyes were checked in 2007.  Checks feets daily.  Has had Diabetes  since 2007.  3. HTN-doing well. No SEs from medications. She does not take her BP at home.   4. Hyperlipidemia-last lipid test we have was from 2010. She is compliant and not having SEs  Past Medical History  Diagnosis Date  . Lactose intolerance   . Diabetes mellitus without complication   . Hypertension   . Cataract   . Hyperlipidemia    Past Surgical History  Procedure Date  . Abdominal hysterectomy 1998    fibroids  . Tummy tuck 1999  . Ankle repari 2010  . Cosmetic surgery    History   Social History  . Marital Status: Married    Spouse Name: N/A    Number of Children: N/A  . Years of Education: N/A   Social History Main Topics  . Smoking status: Former Smoker    Quit date: 11/14/2006  . Smokeless tobacco: Never Used  . Alcohol Use: No  . Drug Use: No  . Sexually Active: None   Other Topics Concern  . None   Social History Narrative  . None   Family History  Problem Relation Age of Onset  . Diabetes Mother   . Diabetes Sister   . Diabetes Brother   . Lupus Daughter    No Known Allergies Prior to Admission medications   Medication Sig Start Date End Date Taking? Authorizing  Provider  amLODipine-benazepril (LOTREL) 10-20 MG per capsule Take 1 capsule by mouth daily.   Yes Historical Provider, MD  atorvastatin (LIPITOR) 10 MG tablet Take 10 mg by mouth daily.   Yes Historical Provider, MD  clobetasol ointment (TEMOVATE) 0.05 % Apply topically 2 (two) times daily. 04/14/12 04/14/13 Yes Gwenlyn Found Copland, MD  Clobetasol Prop Emollient Base (CLOBETASOL PROPIONATE E) 0.05 % emollient cream Apply twice a day as needed 04/14/12  Yes Jessica C Copland, MD  insulin glargine (LANTUS) 100 UNIT/ML injection Inject 15 Units into the skin at bedtime.   Yes Historical Provider, MD  lisinopril (PRINIVIL,ZESTRIL) 10 MG tablet Take 10 mg by mouth daily.   Yes Historical Provider, MD  metFORMIN (GLUCOPHAGE) 1000 MG tablet Take 1,000 mg by mouth 2 (two) times daily with a meal.   Yes Historical Provider, MD  fluticasone (FLONASE) 50 MCG/ACT nasal spray Place 2 sprays into the nose daily. 02/28/12 02/27/13  Gwenlyn Found Copland, MD  olopatadine (PATANOL) 0.1 % ophthalmic solution Place 1 drop into both eyes 2 (two) times daily. 02/28/12 02/27/13  Gwenlyn Found Copland, MD     ROS: The patient denies fevers, chills, night sweats, unintentional weight loss, chest pain, palpitations, wheezing, dyspnea on exertion, nausea, vomiting, abdominal pain,  hematuria, melena,  weakness.   All other  systems have been reviewed and were otherwise negative with the exception of those mentioned in the HPI and as above.    PHYSICAL EXAM: Filed Vitals:   10/26/12 1643  BP: 115/71  Pulse: 83  Temp: 98.5 F (36.9 C)  Resp: 16   Filed Vitals:   10/26/12 1643  Height: 4\' 10"  (1.473 m)  Weight: 138 lb 12.8 oz (62.959 kg)   Body mass index is 29.01 kg/(m^2).  General: Alert, no acute distress HEENT:  Normocephalic, atraumatic, oropharynx patent. EOMI, PERRLA, fundoscopic exam nl. Cardiovascular:  Regular rate and rhythm, no rubs murmurs or gallops.  No Carotid bruits, radial pulse intact. No pedal edema.   Respiratory: Clear to auscultation bilaterally.  No wheezes, rales, or rhonchi.  No cyanosis, no use of accessory musculature GI: No organomegaly, abdomen is soft and non-tender, positive bowel sounds.  No masses. Skin: No rashes. + onychomycosis on feet Neurologic: Facial musculature symmetric. Microfilament exam BL nl.  Psychiatric: Patient is appropriate throughout our interaction. Lymphatic: No cervical lymphadenopathy Musculoskeletal: Gait intact.   LABS: Results for orders placed in visit on 10/26/12  POCT UA - MICROSCOPIC ONLY      Component Value Range   WBC, Ur, HPF, POC 2-6     RBC, urine, microscopic 2-4     Bacteria, U Microscopic neg     Mucus, UA neg     Epithelial cells, urine per micros 0-4     Crystals, Ur, HPF, POC neg     Casts, Ur, LPF, POC neg     Yeast, UA neg    POCT URINALYSIS DIPSTICK      Component Value Range   Color, UA yellow     Clarity, UA clear     Glucose, UA 500     Bilirubin, UA neg     Ketones, UA neg     Spec Grav, UA 1.025     Blood, UA moderate     pH, UA 6.5     Protein, UA ng     Urobilinogen, UA 0.2     Nitrite, UA neg     Leukocytes, UA Negative    POCT GLYCOSYLATED HEMOGLOBIN (HGB A1C)      Component Value Range   Hemoglobin A1C 6.6       EKG/XRAY:   Primary read interpreted by Dr. Conley Rolls at [facility].   ASSESSMENT/PLAN: Encounter Diagnoses  Name Primary?  . Frequency Yes  . Diabetes mellitus   . Hyperlipidemia   . HTN (hypertension)   . Overactive bladder   . Dysuria    1. Will presumptively treat for UTI like sxs with Cipro 250 mg BID x 3 days, urine cx pending. She is diabetic and also has overactive bladder issues so will cover with short course abx 2. HTN-well controlled, compliant 3. T2DM-well controlled,  At goal of < 7. Advise to check feet daily, get annual eye exam, keep BP < 130/80, exercise and follow ADA diet. She does all of it well except her eye exam is overdue.  4. Hyperlipidemia-will check LDL since  already ate 5. Overactive bladder-gave 2 written rx for Detrol and Vesicare, patietn will price out rx and see if she can afford them, she knows to take one or the other not both of the emdication  Follow-up in 3 months or sooner prn Labs pending: LDL, CMP, TSH Patient declined flu vaccine   Chesnee Floren PHUONG, DO 10/27/2012 9:46 AM

## 2012-10-28 LAB — URINE CULTURE: Colony Count: 75000

## 2012-10-29 ENCOUNTER — Telehealth: Payer: Self-pay | Admitting: Radiology

## 2012-10-29 NOTE — Telephone Encounter (Signed)
Message copied by Marinus Maw on Mon Oct 29, 2012 11:29 AM ------      Message from: Lenell Antu      Created: Sat Oct 27, 2012  9:48 AM       Everything looks good kidneys, liver, thyroid. Her LDL ( bad cholesterol) slightly high. Continue with same meds. F/u in 3 months. Remind her to get eyes checked, she is way overdue.      Send copy of results if she wants it.             Thx

## 2012-10-31 NOTE — Telephone Encounter (Signed)
Patient advised.

## 2012-10-31 NOTE — Telephone Encounter (Signed)
Left message for her to call me back. 

## 2012-11-01 ENCOUNTER — Telehealth: Payer: Self-pay | Admitting: Radiology

## 2012-11-01 NOTE — Telephone Encounter (Signed)
Message copied by Caffie Damme on Thu Nov 01, 2012  9:47 AM ------      Message from: LE, [place] P      Created: Wed Oct 31, 2012  2:53 PM       Urine cx was negative.

## 2012-11-01 NOTE — Telephone Encounter (Signed)
Thanks, I called patient to advise, yesterday.

## 2012-11-24 ENCOUNTER — Telehealth: Payer: Self-pay

## 2012-11-24 NOTE — Telephone Encounter (Signed)
PT WANTS A MED REVIEW - PLEASE CALL [phone] OR [phone]

## 2012-11-26 MED ORDER — INSULIN PEN NEEDLE 32G X 4 MM MISC: Status: DC

## 2012-11-26 MED ORDER — GLUCOSE BLOOD VI STRP: ORAL_STRIP | Status: DC

## 2012-11-26 NOTE — Telephone Encounter (Signed)
Called patient. She needed pen needles and test strips, these are sent in for her.

## 2012-12-05 ENCOUNTER — Other Ambulatory Visit: Payer: Self-pay | Admitting: *Deleted

## 2012-12-05 MED ORDER — GLUCOSE BLOOD VI STRP: ORAL_STRIP | Status: DC

## 2013-03-06 ENCOUNTER — Ambulatory Visit (INDEPENDENT_AMBULATORY_CARE_PROVIDER_SITE_OTHER): Payer: BC Managed Care – PPO | Admitting: Family Medicine

## 2013-03-06 VITALS — BP 113/74 | HR 75 | Temp 98.5°F | Resp 18 | Wt 135.0 lb

## 2013-03-06 DIAGNOSIS — E119 Type 2 diabetes mellitus without complications: Secondary | ICD-10-CM

## 2013-03-06 MED ORDER — BUTALBITAL-APAP-CAFFEINE 50-325-40 MG PO TABS: 1.0000 | ORAL_TABLET | Freq: Four times a day (QID) | ORAL | Status: DC | PRN

## 2013-03-06 MED ORDER — FLUTICASONE PROPIONATE 50 MCG/ACT NA SUSP: 2.0000 | Freq: Every day | NASAL | Status: DC

## 2013-03-06 NOTE — Progress Notes (Signed)
[facility] [address], [place] [address] [phone]  Date:  03/06/2013   Name:  Gabrielle [name]   DOB:  06-24-1956   MRN:  [id number]  PCP:  Abbe Amsterdam, MD    Chief Complaint: Headache   History of Present Illness:  Gabrielle [name] is a 57 y.o. very pleasant female patient who presents with the following:  She is here to evaluate headaches. She has noted a HA for about 3 days.  She saw her eye doctor today because she got a new glasses rx about two weeks ago and thought this might be causing her HA. Her opthalmologist was concerned about a possible sinus HA/ sinus infection so he sent her for evaluation.  Her IOP is ok, and there was no eye problem noted  She notes pain in there right side of her face/ head.  She feels pressure and pain.  She has a runny nose and runny eyes.  She does not note any nasal congestion.  She is sneezing. No cough She has not noted a fever, chills or aches No GI sympotms.  Last A1c here in December was 6.6%. She is checking her glucose- this am she was running 99.    She has tried ibuprofen, but this has not helped.  She has mild photophobia, no phonophobia. No visual disturbance.    Patient Active Problem List  Diagnosis  . Diabetes mellitus  . High cholesterol  . Hypertension    Past Medical History  Diagnosis Date  . Lactose intolerance   . Diabetes mellitus without complication   . Hypertension   . Cataract   . Hyperlipidemia     Past Surgical History  Procedure Laterality Date  . Abdominal hysterectomy  1998    fibroids  . Tummy tuck  1999  . Ankle repari  2010  . Cosmetic surgery      History  Substance Use Topics  . Smoking status: Former Smoker    Quit date: 11/14/2006  . Smokeless tobacco: Never Used  . Alcohol Use: No    Family History  Problem Relation Age of Onset  . Diabetes Mother   . Diabetes Sister   . Diabetes Brother   . Lupus Daughter     No Known  Allergies  Medication list has been reviewed and updated.  Current Outpatient Prescriptions on File Prior to Visit  Medication Sig Dispense Refill  . amLODipine (NORVASC) 10 MG tablet Take 1 tablet (10 mg total) by mouth daily.  90 tablet  3  . atorvastatin (LIPITOR) 10 MG tablet Take 1 tablet (10 mg total) by mouth daily.  90 tablet  3  . Clobetasol Prop Emollient Base (CLOBETASOL PROPIONATE E) 0.05 % emollient cream Apply twice a day as needed  30 g  2  . glucose blood (ACCU-CHEK AVIVA) test strip Use to check glucose three times a day. Dx code 250.00  100 each  3  . insulin glargine (LANTUS SOLOSTAR) 100 UNIT/ML injection 15 units Tool nightly. Please dispense 5 pens.  5 pen  5  . Insulin Pen Needle (INSUPEN PEN NEEDLES) 32G X 4 MM MISC To use with lantus  100 each  2  . lisinopril (PRINIVIL,ZESTRIL) 10 MG tablet Take 1 tablet (10 mg total) by mouth daily.  90 tablet  3  . metFORMIN (GLUCOPHAGE) 1000 MG tablet Take 1 tablet (1,000 mg total) by mouth 2 (two) times daily with a meal.  180 tablet  3  No current facility-administered medications on file prior to visit.    Review of Systems:  As per HPI- otherwise negative.   Physical Examination: Filed Vitals:   03/06/13 1202  BP: 113/74  Pulse: 75  Temp: 98.5 F (36.9 C)  Resp: 18   Filed Vitals:   03/06/13 1202  Weight: 135 lb (61.236 kg)   Body mass index is 28.22 kg/(m^2). Ideal Body Weight:    GEN: WDWN, NAD, Non-toxic, A & O x 3 HEENT: Atraumatic, Normocephalic. Neck supple. No masses, No LAD. Ears and Nose: No external deformity. CV: RRR, No M/G/R. No JVD. No thrill. No extra heart sounds. PULM: CTA B, no wheezes, crackles, rhonchi. No retractions. No resp. distress. No accessory muscle use. ABD: S, NT, ND, +BS. No rebound. No HSM. EXTR: No c/c/e NEURO Normal gait.  PSYCH: Normally interactive. Conversant. Not depressed or anxious appearing.  Calm demeanor.   Results for orders placed in visit on 03/06/13  POCT  GLYCOSYLATED HEMOGLOBIN (HGB A1C)      Result Value Range   Hemoglobin A1C 7.1       Assessment and Plan: Sinus headache - Plan: butalbital-acetaminophen-caffeine (FIORICET, ESGIC) 50-325-40 MG per tablet, fluticasone (FLONASE) 50 MCG/ACT nasal spray  Type II or unspecified type diabetes mellitus without mention of complication, not stated as uncontrolled - Plan: POCT glycosylated hemoglobin (Hb A1C)  Gabrielle [name] stated she had a "worst HA of life."  Counseled her that I would recommend a CT or MRI scan to ensure she does not have a bleed or any other serious patholgoy.  She declined imaging for now- "It's not that serious."  She prefers to manage conservatively. will try fioricet and flonase.  Aware of possible sedation with fioricet.  Follow- up if not better in 24 hours   Meds ordered this encounter  Medications  . butalbital-acetaminophen-caffeine (FIORICET, ESGIC) 50-325-40 MG per tablet    Sig: Take 1 tablet by mouth every 6 (six) hours as needed for headache.    Dispense:  30 tablet    Refill:  0  . fluticasone (FLONASE) 50 MCG/ACT nasal spray    Sig: Place 2 sprays into the nose daily.    Dispense:  16 g    Refill:  6      Signed Abbe Amsterdam, MD

## 2013-03-06 NOTE — Patient Instructions (Addendum)
Try to stick with a regualar diet to avoid changes in your blood sugar  Use the fioricet as needed for headache- if you are not better within 24 hours please call or come back in as a CT scan may be needed.  If you get worse overnight go to the ER.

## 2013-05-22 ENCOUNTER — Telehealth: Payer: Self-pay

## 2013-05-22 DIAGNOSIS — L309 Dermatitis, unspecified: Secondary | ICD-10-CM

## 2013-05-22 MED ORDER — CLOBETASOL PROP EMOLLIENT BASE 0.05 % EX CREA: TOPICAL_CREAM | CUTANEOUS | Status: DC

## 2013-05-22 NOTE — Telephone Encounter (Signed)
Sent to pharmacy 

## 2013-05-22 NOTE — Telephone Encounter (Signed)
Pt states that she would like a refill on Clobetasol Prop Emollient Base (CLOBETASOL PROPIONATE E) 0.05 % emollient cream. Best# [phone]

## 2013-05-24 NOTE — Telephone Encounter (Signed)
Pt states that she would also like an ointment called in when asked the name of the ointment she stated Clobetasol ointment. Best# [phone]

## 2013-05-28 MED ORDER — CLOBETASOL PROP EMOLLIENT BASE 0.05 % EX CREA: TOPICAL_CREAM | CUTANEOUS | Status: DC

## 2013-05-28 MED ORDER — CLOBETASOL PROPIONATE 0.05 % EX OINT: TOPICAL_OINTMENT | Freq: Two times a day (BID) | CUTANEOUS | Status: DC

## 2013-05-28 NOTE — Telephone Encounter (Signed)
Pt CB and stated that only the cream was called in and she uses both the cream AND the ointment. She also stated that she had to pay $12 for a little 30 gram tube of the cream and she normally gets 60 grams of both the cream and the ointment. Can we send in a Rx for the ointment and a new Rx for RFs of cream in the 60 gram size? I have pended both.

## 2013-05-28 NOTE — Telephone Encounter (Signed)
Sent!

## 2013-05-28 NOTE — Telephone Encounter (Signed)
Notified pt. 

## 2013-06-10 ENCOUNTER — Telehealth: Payer: Self-pay

## 2013-06-10 ENCOUNTER — Other Ambulatory Visit: Payer: Self-pay | Admitting: Radiology

## 2013-06-10 MED ORDER — GLUCOSE BLOOD VI STRP: ORAL_STRIP | Status: DC

## 2013-06-10 NOTE — Telephone Encounter (Signed)
Sent in

## 2013-06-10 NOTE — Telephone Encounter (Signed)
Pt would like to know if she could have some testing strips called into the [facility] on [address]. She would like accu check nano smartview strips 100ct.  [phone]

## 2013-06-11 ENCOUNTER — Other Ambulatory Visit: Payer: Self-pay

## 2013-06-11 MED ORDER — BLOOD GLUCOSE MONITOR KIT: PACK | Status: DC

## 2013-09-17 ENCOUNTER — Other Ambulatory Visit: Payer: Self-pay | Admitting: Physician Assistant

## 2013-11-13 ENCOUNTER — Ambulatory Visit (INDEPENDENT_AMBULATORY_CARE_PROVIDER_SITE_OTHER): Payer: BC Managed Care – PPO | Admitting: Family Medicine

## 2013-11-13 VITALS — BP 112/62 | HR 96 | Temp 99.3°F | Resp 16 | Ht <= 58 in | Wt 136.0 lb

## 2013-11-13 DIAGNOSIS — E119 Type 2 diabetes mellitus without complications: Secondary | ICD-10-CM

## 2013-11-13 DIAGNOSIS — N318 Other neuromuscular dysfunction of bladder: Secondary | ICD-10-CM

## 2013-11-13 DIAGNOSIS — R32 Unspecified urinary incontinence: Secondary | ICD-10-CM

## 2013-11-13 DIAGNOSIS — I4510 Unspecified right bundle-branch block: Secondary | ICD-10-CM

## 2013-11-13 DIAGNOSIS — N3281 Overactive bladder: Secondary | ICD-10-CM

## 2013-11-13 DIAGNOSIS — R05 Cough: Secondary | ICD-10-CM

## 2013-11-13 DIAGNOSIS — I10 Essential (primary) hypertension: Secondary | ICD-10-CM

## 2013-11-13 DIAGNOSIS — R059 Cough, unspecified: Secondary | ICD-10-CM

## 2013-11-13 DIAGNOSIS — E785 Hyperlipidemia, unspecified: Secondary | ICD-10-CM

## 2013-11-13 LAB — MICROALBUMIN, URINE: Microalb, Ur: 0.79 mg/dL (ref 0.00–1.89)

## 2013-11-13 LAB — POCT CBC
Granulocyte percent: 55.6 %G (ref 37–80)
HCT, POC: 41.2 % (ref 37.7–47.9)
Hemoglobin: 12.4 g/dL (ref 12.2–16.2)
MCH, POC: 28.3 pg (ref 27–31.2)
MCV: 94.1 fL (ref 80–97)
MID (cbc): 0.5 (ref 0–0.9)
Platelet Count, POC: 294 10*3/uL (ref 142–424)
RBC: 4.38 M/uL (ref 4.04–5.48)
WBC: 6.8 10*3/uL (ref 4.6–10.2)

## 2013-11-13 LAB — POCT UA - MICROSCOPIC ONLY
Bacteria, U Microscopic: NEGATIVE
Mucus, UA: NEGATIVE
WBC, Ur, HPF, POC: NEGATIVE

## 2013-11-13 LAB — LIPID PANEL
LDL Cholesterol: 103 mg/dL — ABNORMAL HIGH (ref 0–99)
Triglycerides: 163 mg/dL — ABNORMAL HIGH (ref <150)
VLDL: 33 mg/dL (ref 0–40)

## 2013-11-13 LAB — POCT GLYCOSYLATED HEMOGLOBIN (HGB A1C): Hemoglobin A1C: 6.9

## 2013-11-13 LAB — COMPREHENSIVE METABOLIC PANEL
AST: 28 U/L (ref 0–37)
Albumin: 4.4 g/dL (ref 3.5–5.2)
BUN: 9 mg/dL (ref 6–23)
Calcium: 9.9 mg/dL (ref 8.4–10.5)
Chloride: 99 mEq/L (ref 96–112)
Glucose, Bld: 135 mg/dL — ABNORMAL HIGH (ref 70–99)
Potassium: 4.4 mEq/L (ref 3.5–5.3)
Sodium: 136 mEq/L (ref 135–145)
Total Protein: 8.1 g/dL (ref 6.0–8.3)

## 2013-11-13 LAB — POCT URINALYSIS DIPSTICK
Bilirubin, UA: NEGATIVE
Glucose, UA: NEGATIVE
Leukocytes, UA: NEGATIVE
Nitrite, UA: NEGATIVE
Protein, UA: NEGATIVE
Spec Grav, UA: 1.02

## 2013-11-13 MED ORDER — INSULIN GLARGINE 100 UNIT/ML ~~LOC~~ SOLN: SUBCUTANEOUS | Status: DC

## 2013-11-13 MED ORDER — METFORMIN HCL 1000 MG PO TABS: 1000.0000 mg | ORAL_TABLET | Freq: Two times a day (BID) | ORAL | Status: DC

## 2013-11-13 MED ORDER — LISINOPRIL 10 MG PO TABS: 10.0000 mg | ORAL_TABLET | Freq: Every day | ORAL | Status: DC

## 2013-11-13 MED ORDER — AMLODIPINE BESYLATE 10 MG PO TABS: 10.0000 mg | ORAL_TABLET | Freq: Every day | ORAL | Status: DC

## 2013-11-13 MED ORDER — GUAIFENESIN-CODEINE 100-10 MG/5ML PO SOLN: 5.0000 mL | Freq: Four times a day (QID) | ORAL | Status: DC | PRN

## 2013-11-13 MED ORDER — ATORVASTATIN CALCIUM 10 MG PO TABS: 10.0000 mg | ORAL_TABLET | Freq: Every day | ORAL | Status: DC

## 2013-11-13 NOTE — Progress Notes (Deleted)
   Subjective:    Patient ID: Gabrielle [name], female    DOB: 1956-05-24, 57 y.o.   MRN: [id number]  HPI    Review of Systems     Objective:   Physical Exam        Assessment & Plan:

## 2013-11-13 NOTE — Patient Instructions (Signed)
Continue same regular medications  Scheduled self for mammogram sometime soon  A referral is being made to the urologist. If you do not hear from anyone on that in the next week or 10 days call and check to see what is happening to the referral.  Plan to return in about April for a recheck. At that time we will consider referring you to an ophthalmologist for your annual eye exam and evaluation of the cataract.  Return sooner if problems

## 2013-11-13 NOTE — Progress Notes (Signed)
Physical examination:  History: Patient is here for a physical examination. She has no major acute complaints except for a negative cough which continues to hang on for recent upper respiratory infection. She is out of her codeine cough syrup and relax more for that. She saw an optometrist  earlier this year. She gets sugars in the 160s at home. She's not getting any exercise. She has diabetes, hypertension, hyperlipidemia.  Past medical history: Surgeries: Hysterectomy, tummy tuck Medical illnesses: Hypertension Diabetes Hyperlipidemia Allergies: None Regular medications:  see chart  Family,social history in chart  Review of systems: Constitutional: Unremarkable HEENT: Unremarkable except she does know that she has a cataract on her left eye and was told not to worry about it yet. She does have some problem with nighttime driving Cardiovascular: Unremarkable Respiratory: Unremarkable GI: Unremarkable GU: Has fairly constant urinary leakage Muscular skeletal: Unremarkable Dermatologic unremarkable Neurologic unremarkable Endocrine: Diabetes  Physical examination: Well-developed well-nourished lady in no major distress. TMs normal. Has a left cataract. Fundi appear benign. Throat clear. Neck supple without nodes or thyromegaly. No carotid bruits. Chest is clear to auscultation. Heart regular without murmurs gallops or arrhythmias. Abdomen soft without mass or tenderness. Breasts are symmetrical. No dimpling or nodules. No axillary nodes are noted. No breast masses. Abdomen soft without mass or tenderness. Extremities without edema. Pedal pulses present.  Assessment: Annual physical exam Diabetes Hypertension Hyperlipidemia Left cataract Urinary incontinence History of hysterectomy   Plan: Mammogram EKG Labs Urinalysis Refer to urology  EKG shows right bundle branch block but no acute abnormalities  Results for orders placed in visit on 11/13/13  POCT CBC      Result  Value Range   WBC 6.8  4.6 - 10.2 K/uL   Lymph, poc 2.5  0.6 - 3.4   POC LYMPH PERCENT 36.4  10 - 50 %L   MID (cbc) 0.5  0 - 0.9   POC MID % 8.0  0 - 12 %M   POC Granulocyte 3.8  2 - 6.9   Granulocyte percent 55.6  37 - 80 %G   RBC 4.38  4.04 - 5.48 M/uL   Hemoglobin 12.4  12.2 - 16.2 g/dL   HCT, POC 16.1  09.6 - 47.9 %   MCV 94.1  80 - 97 fL   MCH, POC 28.3  27 - 31.2 pg   MCHC 30.1 (*) 31.8 - 35.4 g/dL   RDW, POC 04.5     Platelet Count, POC 294  142 - 424 K/uL   MPV 8.6  0 - 99.8 fL  POCT GLYCOSYLATED HEMOGLOBIN (HGB A1C)      Result Value Range   Hemoglobin A1C 6.9    POCT UA - MICROSCOPIC ONLY      Result Value Range   WBC, Ur, HPF, POC negative     RBC, urine, microscopic 1-3     Bacteria, U Microscopic negative     Mucus, UA negative     Epithelial cells, urine per micros 0-1     Crystals, Ur, HPF, POC negative     Casts, Ur, LPF, POC negative     Yeast, UA negative    POCT URINALYSIS DIPSTICK      Result Value Range   Color, UA yellow     Clarity, UA clear     Glucose, UA negative     Bilirubin, UA negative     Ketones, UA negative     Spec Grav, UA 1.020     Blood, UA moderate  pH, UA 5.5     Protein, UA negative     Urobilinogen, UA 0.2     Nitrite, UA negative     Leukocytes, UA Negative     Same medications Will give cough syrup for when necessary use  At next visit we'll probably refer her to an ophthalmologist for her annual eye exam. She has a fairly dense cataract developing on the left eye, has difficulty with night vision. Her optometrist told her she did not need anything done yet, but I think she should see an ophthalmologist next year.

## 2013-11-14 ENCOUNTER — Other Ambulatory Visit: Payer: Self-pay | Admitting: Family Medicine

## 2013-11-30 ENCOUNTER — Other Ambulatory Visit: Payer: Self-pay | Admitting: Family Medicine

## 2014-01-02 ENCOUNTER — Other Ambulatory Visit: Payer: Self-pay | Admitting: Urology

## 2014-03-04 ENCOUNTER — Encounter (HOSPITAL_BASED_OUTPATIENT_CLINIC_OR_DEPARTMENT_OTHER): Payer: Self-pay | Admitting: *Deleted

## 2014-03-04 NOTE — Progress Notes (Signed)
NPO AFTER MN. ARRIVE AT 0745. NEEDS ISTAT. CURRENT EKG IN CHART AND EPIC. WILL TAKE NORVASC AND LIPITOR AM DOS W/ SIPS OF WATER.

## 2014-03-05 NOTE — H&P (Signed)
eason For Visit Seen today for a pre-op H&P.   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 Pre-op evaluation (V72.84)   Assessed By: Jimmey Ralph (Urology); Last Assessed: 27 Feb 2014 3. Urge and stress incontinence (788.33)   Assessed By: Jimmey Ralph (Urology); Last Assessed: 27 Feb 2014  History of Present Illness 58 YO female patient of Dr Ralene Muskrat seen today for a pre-op exam.     GU HX:    Feb 2015 urodynamics. She has mixed incontinence and has failed multiple anticholinergics. On exam she has SUI with no hypermobility. The study today showed a 386cc capacity bladder with instability and UUI at that volume. She has a low VLPP of 40cm at 200cc and 42cm at 300cc. She has some cough and valsalva triggered urgency at higher volumes. She empties well but has a low flow and voiding pressure suggesting a weak detrusor.     Interval HX:   Today states she has been doing well. No f/c, n/v, or cough congestion.   Past Medical History Problems  1. History of diabetes mellitus (V12.29) 2. History of esophageal reflux (V12.79) 3. History of hypertension (V12.59)  Surgical History Problems  1. History of Abdominoplasty 2. History of Hysterectomy 3. History of Tubal Ligation  Current Meds 1. AmLODIPine Besylate 10 MG Oral Tablet;  Therapy: (Recorded:19Jan2015) to Recorded 2. Aspirin 81 MG Oral Tablet;  Therapy: (Recorded:19Jan2015) to Recorded 3. Atorvastatin Calcium 10 MG Oral Tablet;  Therapy: (Recorded:19Jan2015) to Recorded 4. Lantus SOLN;  Therapy: (Recorded:19Jan2015) to Recorded 5. Lisinopril 10 MG Oral Tablet;  Therapy: (Recorded:19Jan2015) to Recorded 6. MetFORMIN HCl - 1000 MG Oral Tablet;  Therapy: (Recorded:19Jan2015) to Recorded  Allergies Medication  1. No Known Drug Allergies  Family History Problems  1. Family history of diabetes mellitus (V18.0) :  Mother, Sister, Brother  Social History Problems  1. Denied: History of Alcohol use 2. Former smoker (V15.82) 3. Married 4. Number of children   one daughter 5. Occupation   Astronomer  Review of Systems Genitourinary, constitutional, skin, eye, otolaryngeal, hematologic/lymphatic, cardiovascular, pulmonary, endocrine, musculoskeletal, gastrointestinal, neurological and psychiatric system(s) were reviewed and pertinent findings if present are noted.    Vitals Vital Signs [Data Includes: Last 1 Day]  Recorded: 16Apr2015 09:15AM  Blood Pressure: 128 / 79 Temperature: 98.6 F Heart Rate: 81  Physical Exam Constitutional: Well nourished and well developed . No acute distress. The patient appears well hydrated.  ENT:. The ears and nose are normal in appearance.  Neck: The appearance of the neck is normal.  Pulmonary: No respiratory distress.  Cardiovascular: Heart rate and rhythm are normal.  Abdomen: The abdomen is mildly obese. The abdomen is soft and nontender.  Skin: Normal skin turgor and normal skin color and pigmentation.  Neuro/Psych:. Mood and affect are appropriate.    Results/Data Urine [Data Includes: Last 1 Day]   16Apr2015  COLOR YELLOW   APPEARANCE CLEAR   SPECIFIC GRAVITY 1.020   pH 5.5   GLUCOSE 500 mg/dL  BILIRUBIN NEG   KETONE NEG mg/dL  BLOOD MOD   PROTEIN NEG mg/dL  UROBILINOGEN 0.2 mg/dL  NITRITE NEG   LEUKOCYTE ESTERASE NEG   SQUAMOUS EPITHELIAL/HPF RARE   WBC NONE SEEN WBC/hpf  RBC 3-6 RBC/hpf  BACTERIA NONE SEEN   CRYSTALS NONE SEEN   CASTS NONE SEEN    The following clinical lab reports were reviewed:  UA- glycosuria.  Assessment Assessed  1. Pre-op evaluation (V72.84) 2. 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 Urge and stress incontinence (788.33)  Plan Health Maintenance  1. UA With REFLEX; [Do Not Release]; Status:Resulted - Requires Verification;   Done:  [id number] 09:07AM  Proceed with [facility] sling placement  03/06/14.   Discussion/Summary CC: Dr. Ruben Reason.

## 2014-03-06 ENCOUNTER — Encounter (HOSPITAL_BASED_OUTPATIENT_CLINIC_OR_DEPARTMENT_OTHER)
Admission: RE | Disposition: A | Discharge status: Home / Self Care | Payer: Self-pay | Source: Ambulatory Visit | Attending: Urology

## 2014-03-06 ENCOUNTER — Encounter (HOSPITAL_BASED_OUTPATIENT_CLINIC_OR_DEPARTMENT_OTHER): Payer: Self-pay

## 2014-03-06 ENCOUNTER — Ambulatory Visit (HOSPITAL_BASED_OUTPATIENT_CLINIC_OR_DEPARTMENT_OTHER)
Admission: RE | Admit: 2014-03-06 | Discharge: 2014-03-06 | Disposition: A | Discharge status: Home / Self Care | Payer: BC Managed Care – PPO | Source: Ambulatory Visit | Attending: Urology | Admitting: Urology

## 2014-03-06 ENCOUNTER — Ambulatory Visit (HOSPITAL_BASED_OUTPATIENT_CLINIC_OR_DEPARTMENT_OTHER): Payer: BC Managed Care – PPO | Admitting: Anesthesiology

## 2014-03-06 ENCOUNTER — Encounter (HOSPITAL_BASED_OUTPATIENT_CLINIC_OR_DEPARTMENT_OTHER): Payer: BC Managed Care – PPO | Admitting: Anesthesiology

## 2014-03-06 DIAGNOSIS — Z79899 Other long term (current) drug therapy: Secondary | ICD-10-CM | POA: Insufficient documentation

## 2014-03-06 DIAGNOSIS — N393 Stress incontinence (female) (male): Secondary | ICD-10-CM | POA: Diagnosis present

## 2014-03-06 DIAGNOSIS — K219 Gastro-esophageal reflux disease without esophagitis: Secondary | ICD-10-CM | POA: Insufficient documentation

## 2014-03-06 DIAGNOSIS — Z794 Long term (current) use of insulin: Secondary | ICD-10-CM | POA: Insufficient documentation

## 2014-03-06 DIAGNOSIS — N3946 Mixed incontinence: Secondary | ICD-10-CM | POA: Insufficient documentation

## 2014-03-06 DIAGNOSIS — Z7982 Long term (current) use of aspirin: Secondary | ICD-10-CM | POA: Insufficient documentation

## 2014-03-06 DIAGNOSIS — Z87891 Personal history of nicotine dependence: Secondary | ICD-10-CM | POA: Insufficient documentation

## 2014-03-06 DIAGNOSIS — N3642 Intrinsic sphincter deficiency (ISD): Secondary | ICD-10-CM | POA: Diagnosis present

## 2014-03-06 DIAGNOSIS — Z90710 Acquired absence of both cervix and uterus: Secondary | ICD-10-CM | POA: Insufficient documentation

## 2014-03-06 DIAGNOSIS — E119 Type 2 diabetes mellitus without complications: Secondary | ICD-10-CM | POA: Insufficient documentation

## 2014-03-06 DIAGNOSIS — I10 Essential (primary) hypertension: Secondary | ICD-10-CM | POA: Insufficient documentation

## 2014-03-06 HISTORY — DX: Unspecified right bundle-branch block: I45.10

## 2014-03-06 HISTORY — DX: Intrinsic sphincter deficiency (ISD): N36.42

## 2014-03-06 HISTORY — DX: Type 2 diabetes mellitus without complications: E11.9

## 2014-03-06 HISTORY — DX: Presence of spectacles and contact lenses: Z97.3

## 2014-03-06 HISTORY — DX: Dermatitis, unspecified: L30.9

## 2014-03-06 HISTORY — DX: Stress incontinence (female) (male): N39.3

## 2014-03-06 HISTORY — PX: BLADDER SUSPENSION: SHX72

## 2014-03-06 HISTORY — DX: Presence of dental prosthetic device (complete) (partial): Z97.2

## 2014-03-06 LAB — GLUCOSE, CAPILLARY: GLUCOSE-CAPILLARY: 170 mg/dL — AB (ref 70–99)

## 2014-03-06 LAB — POCT I-STAT 4, (NA,K, GLUC, HGB,HCT)
GLUCOSE: 148 mg/dL — AB (ref 70–99)
HCT: 43 % (ref 36.0–46.0)
Hemoglobin: 14.6 g/dL (ref 12.0–15.0)
Potassium: 4 mEq/L (ref 3.7–5.3)
Sodium: 139 mEq/L (ref 137–147)

## 2014-03-06 SURGERY — CREATION, URETHRAL SLING, RETROPUBIC APPROACH, USING POLYPROPYLENE TAPE
Anesthesia: General | Site: Vagina

## 2014-03-06 MED ORDER — LIDOCAINE-EPINEPHRINE (PF) 1 %-1:200000 IJ SOLN
INTRAMUSCULAR | Status: DC | PRN
Start: 2014-03-06 — End: 2014-03-06
  Administered 2014-03-06: 7 mL

## 2014-03-06 MED ORDER — SODIUM CHLORIDE 0.9 % IJ SOLN
3.0000 mL | Freq: Two times a day (BID) | INTRAMUSCULAR | Status: DC
  Filled 2014-03-06: qty 3

## 2014-03-06 MED ORDER — PROMETHAZINE HCL 50 MG PO TABS: 25.0000 mg | ORAL_TABLET | Freq: Four times a day (QID) | ORAL | Status: DC | PRN

## 2014-03-06 MED ORDER — OXYCODONE HCL 5 MG PO TABS
ORAL_TABLET | ORAL | Status: AC
  Filled 2014-03-06: qty 1

## 2014-03-06 MED ORDER — HYDROCODONE-ACETAMINOPHEN 5-325 MG PO TABS: 1.0000 | ORAL_TABLET | Freq: Four times a day (QID) | ORAL | Status: DC | PRN

## 2014-03-06 MED ORDER — LACTATED RINGERS IV SOLN
INTRAVENOUS | Status: DC
  Administered 2014-03-06: 08:00:00 via INTRAVENOUS
  Filled 2014-03-06: qty 1000

## 2014-03-06 MED ORDER — LIDOCAINE HCL (CARDIAC) 20 MG/ML IV SOLN
INTRAVENOUS | Status: DC | PRN
  Administered 2014-03-06: 50 mg via INTRAVENOUS

## 2014-03-06 MED ORDER — MIDAZOLAM HCL 5 MG/5ML IJ SOLN
INTRAMUSCULAR | Status: DC | PRN
  Administered 2014-03-06: 2 mg via INTRAVENOUS

## 2014-03-06 MED ORDER — OXYCODONE HCL 5 MG PO TABS
5.0000 mg | ORAL_TABLET | ORAL | Status: DC | PRN
  Administered 2014-03-06: 5 mg via ORAL
  Filled 2014-03-06: qty 2

## 2014-03-06 MED ORDER — DOCUSATE SODIUM 100 MG PO CAPS: 100.0000 mg | ORAL_CAPSULE | Freq: Two times a day (BID) | ORAL | Status: DC

## 2014-03-06 MED ORDER — SODIUM CHLORIDE 0.9 % IJ SOLN
3.0000 mL | INTRAMUSCULAR | Status: DC | PRN
  Filled 2014-03-06: qty 3

## 2014-03-06 MED ORDER — FENTANYL CITRATE 0.05 MG/ML IJ SOLN
INTRAMUSCULAR | Status: AC
  Filled 2014-03-06: qty 4

## 2014-03-06 MED ORDER — MIDAZOLAM HCL 2 MG/2ML IJ SOLN
INTRAMUSCULAR | Status: AC
  Filled 2014-03-06: qty 2

## 2014-03-06 MED ORDER — ACETAMINOPHEN 10 MG/ML IV SOLN
INTRAVENOUS | Status: DC | PRN
  Administered 2014-03-06: 1000 mg via INTRAVENOUS

## 2014-03-06 MED ORDER — FENTANYL CITRATE 0.05 MG/ML IJ SOLN
25.0000 ug | INTRAMUSCULAR | Status: DC | PRN
  Filled 2014-03-06: qty 1

## 2014-03-06 MED ORDER — SODIUM CHLORIDE 0.9 % IR SOLN
Status: DC | PRN
  Administered 2014-03-06: 09:00:00

## 2014-03-06 MED ORDER — HYDROMORPHONE HCL PF 1 MG/ML IJ SOLN
0.2500 mg | INTRAMUSCULAR | Status: DC | PRN
Start: 2014-03-06 — End: 2014-03-06
  Administered 2014-03-06 (×3): 0.25 mg via INTRAVENOUS
  Filled 2014-03-06: qty 1

## 2014-03-06 MED ORDER — CIPROFLOXACIN IN D5W 400 MG/200ML IV SOLN
400.0000 mg | INTRAVENOUS | Status: AC
  Administered 2014-03-06: 400 mg via INTRAVENOUS
  Filled 2014-03-06: qty 200

## 2014-03-06 MED ORDER — MEPERIDINE HCL 25 MG/ML IJ SOLN
6.2500 mg | INTRAMUSCULAR | Status: DC | PRN
  Filled 2014-03-06: qty 1

## 2014-03-06 MED ORDER — STERILE WATER FOR IRRIGATION IR SOLN
Status: DC | PRN
  Administered 2014-03-06: 3000 mL

## 2014-03-06 MED ORDER — FENTANYL CITRATE 0.05 MG/ML IJ SOLN
INTRAMUSCULAR | Status: DC | PRN
  Administered 2014-03-06 (×2): 50 ug via INTRAVENOUS

## 2014-03-06 MED ORDER — ACETAMINOPHEN 325 MG PO TABS
650.0000 mg | ORAL_TABLET | ORAL | Status: DC | PRN
  Filled 2014-03-06: qty 2

## 2014-03-06 MED ORDER — HYDROMORPHONE HCL PF 1 MG/ML IJ SOLN
INTRAMUSCULAR | Status: AC
  Filled 2014-03-06: qty 1

## 2014-03-06 MED ORDER — PROMETHAZINE HCL 25 MG/ML IJ SOLN
6.2500 mg | INTRAMUSCULAR | Status: DC | PRN
  Filled 2014-03-06: qty 1

## 2014-03-06 MED ORDER — SODIUM CHLORIDE 0.9 % IV SOLN
250.0000 mL | INTRAVENOUS | Status: DC | PRN
  Filled 2014-03-06: qty 250

## 2014-03-06 MED ORDER — ACETAMINOPHEN 650 MG RE SUPP
650.0000 mg | RECTAL | Status: DC | PRN
  Filled 2014-03-06: qty 1

## 2014-03-06 MED ORDER — PROPOFOL 10 MG/ML IV BOLUS
INTRAVENOUS | Status: DC | PRN
  Administered 2014-03-06: 150 mg via INTRAVENOUS

## 2014-03-06 MED ORDER — OXYCODONE HCL 5 MG/5ML PO SOLN
5.0000 mg | Freq: Once | ORAL | Status: DC | PRN
  Filled 2014-03-06: qty 5

## 2014-03-06 MED ORDER — CIPROFLOXACIN HCL 500 MG PO TABS: 500.0000 mg | ORAL_TABLET | Freq: Two times a day (BID) | ORAL | Status: DC

## 2014-03-06 MED ORDER — OXYCODONE HCL 5 MG PO TABS
5.0000 mg | ORAL_TABLET | Freq: Once | ORAL | Status: DC | PRN
Start: 2014-03-06 — End: 2014-03-06
  Filled 2014-03-06: qty 1

## 2014-03-06 MED ORDER — ONDANSETRON HCL 4 MG/2ML IJ SOLN
INTRAMUSCULAR | Status: DC | PRN
Start: 2014-03-06 — End: 2014-03-06
  Administered 2014-03-06: 4 mg via INTRAVENOUS

## 2014-03-06 SURGICAL SUPPLY — 34 items
BAG URINE DRAINAGE (UROLOGICAL SUPPLIES) ×3 IMPLANT
BLADE SURG 15 STRL LF DISP TIS (BLADE) ×1 IMPLANT
BLADE SURG 15 STRL SS (BLADE) ×2
BLADE SURG ROTATE 9660 (MISCELLANEOUS) ×3 IMPLANT
CANISTER SUCTION 1200CC (MISCELLANEOUS) ×3 IMPLANT
CATH FOLEY 2WAY SLVR  5CC 16FR (CATHETERS) ×2
CATH FOLEY 2WAY SLVR 5CC 16FR (CATHETERS) ×1 IMPLANT
CLEANER CAUTERY TIP 5X5 PAD (MISCELLANEOUS) IMPLANT
CLOTH BEACON ORANGE TIMEOUT ST (SAFETY) ×3 IMPLANT
COVER MAYO STAND STRL (DRAPES) ×3 IMPLANT
DERMABOND ADVANCED (GAUZE/BANDAGES/DRESSINGS) ×2
DERMABOND ADVANCED .7 DNX12 (GAUZE/BANDAGES/DRESSINGS) ×1 IMPLANT
DRAPE LG THREE QUARTER DISP (DRAPES) ×3 IMPLANT
DRAPE UNDERBUTTOCKS STRL (DRAPE) ×3 IMPLANT
ELECT REM PT RETURN 9FT ADLT (ELECTROSURGICAL) ×3
ELECTRODE REM PT RTRN 9FT ADLT (ELECTROSURGICAL) ×1 IMPLANT
GAUZE PACKING IODOFORM 2 (PACKING) ×3 IMPLANT
GLOVE SURG SS PI 8.0 STRL IVOR (GLOVE) ×3 IMPLANT
GOWN PREVENTION PLUS LG XLONG (DISPOSABLE) ×3 IMPLANT
GOWN STRL REIN XL XLG (GOWN DISPOSABLE) ×6 IMPLANT
HOLDER FOLEY CATH W/STRAP (MISCELLANEOUS) ×3 IMPLANT
NEEDLE HYPO 22GX1.5 SAFETY (NEEDLE) ×3 IMPLANT
PACK BASIN DAY SURGERY FS (CUSTOM PROCEDURE TRAY) ×3 IMPLANT
PACK CYSTOSCOPY (CUSTOM PROCEDURE TRAY) ×3 IMPLANT
PAD CLEANER CAUTERY TIP 5X5 (MISCELLANEOUS)
PENCIL BUTTON HOLSTER BLD 10FT (ELECTRODE) ×3 IMPLANT
PLUG CATH AND CAP STER (CATHETERS) ×3 IMPLANT
SLING SYSTEM SPARC (Sling) ×3 IMPLANT
SUT VIC AB 2-0 UR6 27 (SUTURE) ×3 IMPLANT
SYR BULB IRRIGATION 50ML (SYRINGE) ×3 IMPLANT
SYRINGE 10CC LL (SYRINGE) ×3 IMPLANT
TUBE CONNECTING 12'X1/4 (SUCTIONS) ×1
TUBE CONNECTING 12X1/4 (SUCTIONS) ×2 IMPLANT
YANKAUER SUCT BULB TIP NO VENT (SUCTIONS) ×3 IMPLANT

## 2014-03-06 NOTE — Interval H&P Note (Signed)
History and Physical Interval Note:  03/06/2014 8:21 AM  Gabrielle [name]  has presented today for surgery, with the diagnosis of STRESS URINARY INCONTINENCE INTRINSIC SPHINCTER DEFICIENCY   The various methods of treatment have been discussed with the patient and family. After consideration of risks, benefits and other options for treatment, the patient has consented to  Procedure(s): SPARC SLING  (N/A) as a surgical intervention .  The patient's history has been reviewed, patient examined, no change in status, stable for surgery.  I have reviewed the patient's chart and labs.  Questions were answered to the patient's satisfaction.     Irine Seal

## 2014-03-06 NOTE — Transfer of Care (Signed)
Immediate Anesthesia Transfer of Care Note  Patient: Gabrielle [name]  Procedure(s) Performed: Procedure(s): SPARC SLING  (N/A)  Patient Location: PACU  Anesthesia Type:General  Level of Consciousness: sedated and responds to stimulation  Airway & Oxygen Therapy: Patient Spontanous Breathing and Patient connected to nasal cannula oxygen  Post-op Assessment: Report given to PACU RN  Post vital signs: Reviewed and stable  Complications: No apparent anesthesia complications

## 2014-03-06 NOTE — Anesthesia Preprocedure Evaluation (Addendum)
Anesthesia Evaluation  Patient identified by MRN, date of birth, ID band Patient awake    Reviewed: Allergy & Precautions, H&P , NPO status , Patient's Chart, lab work & pertinent test results  Airway Mallampati: II TM Distance: >3 FB  Positive for:  Tracheal deviation   Dental  (+) Dental Advisory Given   Pulmonary neg pulmonary ROS, former smoker,  breath sounds clear to auscultation        Cardiovascular hypertension, Pt. on medications + dysrhythmias Rhythm:Regular Rate:Normal  Echo 07/2009 Left ventricle: The cavity size was normal. There was mild  concentric hypertrophy. Systolic function was normal. The estimated ejection fraction was in the range of 60% to 65%. Wall motion was normal; there were no regional wall motion abnormalities. There was an increased relative contribution of atrial contraction to  ventricular filling.    Neuro/Psych negative neurological ROS  negative psych ROS   GI/Hepatic negative GI ROS, Neg liver ROS,   Endo/Other  negative endocrine ROSdiabetes, Type 2, Oral Hypoglycemic Agents, Insulin Dependent  Renal/GU negative Renal ROS     Musculoskeletal negative musculoskeletal ROS (+)   Abdominal   Peds  Hematology negative hematology ROS (+)   Anesthesia Other Findings   Reproductive/Obstetrics negative OB ROS                          Anesthesia Physical Anesthesia Plan  ASA: II  Anesthesia Plan: General   Post-op Pain Management:    Induction: Intravenous  Airway Management Planned: LMA  Additional Equipment:   Intra-op Plan:   Post-operative Plan: Extubation in OR  Informed Consent: I have reviewed the patients History and Physical, chart, labs and discussed the procedure including the risks, benefits and alternatives for the proposed anesthesia with the patient or authorized representative who has indicated his/her understanding and acceptance.    Dental advisory given  Plan Discussed with: CRNA  Anesthesia Plan Comments:         Anesthesia Quick Evaluation

## 2014-03-06 NOTE — Anesthesia Postprocedure Evaluation (Signed)
Anesthesia Post Note  Patient: Gabrielle [name]  Procedure(s) Performed: Procedure(s) (LRB): [place] SLING  (N/A)  Anesthesia type: General  Patient location: PACU  Post pain: Pain level controlled  Post assessment: Post-op Vital signs reviewed  Last Vitals: BP 99/75  Pulse 73  Temp(Src) 36 C (Oral)  Resp 14  Ht 4\' 9"  (1.448 m)  Wt 138 lb (62.596 kg)  BMI 29.85 kg/m2  SpO2 91%  Post vital signs: Reviewed  Level of consciousness: sedated  Complications: No apparent anesthesia complications

## 2014-03-06 NOTE — Brief Op Note (Signed)
03/06/2014  9:10 AM  PATIENT:  Gabrielle [name]  58 y.o. female  PRE-OPERATIVE DIAGNOSIS:  STRESS URINARY INCONTINENCE INTRINSIC SPHINCTER DEFICIENCY   POST-OPERATIVE DIAGNOSIS:  STRESS URINARY INCONTINENCE INTRINSIC SPHINCTER DEFICIENCY  PROCEDURE:  Procedure(s): SPARC SLING  (N/A)  SURGEON:  Surgeon(s) and Role:    * Irine Seal, MD - Primary  PHYSICIAN ASSISTANT:   ASSISTANTS: none   ANESTHESIA:   general  EBL:  Total I/O In: 200 [I.V.:200] Out: -   BLOOD ADMINISTERED:none  DRAINS: Urinary Catheter (Foley) and vaginal packing   LOCAL MEDICATIONS USED:  LIDOCAINE  and Amount: 10 ml  SPECIMEN:  No Specimen  DISPOSITION OF SPECIMEN:  N/A  COUNTS:  YES  TOURNIQUET:  * No tourniquets in log *  DICTATION: .Other Dictation: Dictation Number [id number]  PLAN OF CARE: Discharge to home after PACU  PATIENT DISPOSITION:  PACU - hemodynamically stable.   Delay start of Pharmacological VTE agent (>24hrs) due to surgical blood loss or risk of bleeding: not applicable

## 2014-03-06 NOTE — Anesthesia Procedure Notes (Signed)
Procedure Name: LMA Insertion Date/Time: 03/06/2014 8:37 AM Performed by: Bethena Roys T Pre-anesthesia Checklist: Patient identified, Emergency Drugs available, Suction available and Patient being monitored Patient Re-evaluated:Patient Re-evaluated prior to inductionOxygen Delivery Method: Circle System Utilized Preoxygenation: Pre-oxygenation with 100% oxygen Intubation Type: IV induction Ventilation: Mask ventilation without difficulty LMA: LMA with gastric port inserted LMA Size: 4.0 Number of attempts: 1 Placement Confirmation: positive ETCO2 Tube secured with: Tape Dental Injury: Teeth and Oropharynx as per pre-operative assessment

## 2014-03-06 NOTE — Discharge Instructions (Addendum)
Tension Free Vaginal Tape System, Care After Please read the instructions below. Refer to these instructions for the next few weeks. These instructions provide you with general information on caring for yourself after your operation. Your caregiver may also give you specific instructions. While your treatment has been planned according to the most current medical practices available, unavoidable problems sometimes occur. Discomfort from the operation area is normal for a couple of weeks. If you have any questions or problems after discharge, please call your caregiver. HOME CARE INSTRUCTIONS   Take your prescribed medications as directed by your caregiver.  You may take over-the-counter medication for minor pain with your caregiver's recommendation.  You may resume your usual diet.  Do not take aspirin. It can cause bleeding.  It is helpful to have a responsible person with you for a few days or a week after the surgery.  Shower or bath as directed.  Do not lift anything over 5 pounds.  Change your dressing as directed.  Do not drive until your caregiver says it is OK.  Do not have sexual intercourse until your caregiver says it is OK.  Do not use tampons.  You may take a laxative with your caregiver's recommendation.  You may take sitz baths 2 to 3 times a day with your caregiver's advice.  Make and keep your postoperative appointments. SEEK MEDICAL CARE IF:   There is increasing pain in the wound area.  There is swelling or redness in the wound area.  You develop abnormal vaginal discharge.  You develop a rash.  You develop nausea, vomiting, constipation or diarrhea.  You think the stitches in the wound are breaking.  You lose urine when you cough.  You have problems with your medications. SEEK IMMEDIATE MEDICAL CARE IF:   You develop a temperature of 102 F (38.9 C) or higher.  You have bleeding from the wound area above the pubic bone or from the  vagina.  You see pus coming from the incisions.  You cannot urinate.  You develop bloody or painful urination.  You pass out.  You develop leg or chest pain.  You develop abdominal pain.  You develop shortness of breath. Document Released: 01/27/2009 Document Revised: 08/21/2013 Document Reviewed: 01/27/2009 [facility] Patient Information 2014 [place], [place].  Post Anesthesia Home Care Instructions  Activity: Get plenty of rest for the remainder of the day. A responsible adult should stay with you for 24 hours following the procedure.  For the next 24 hours, DO NOT: -Drive a car -Paediatric nurse -Drink alcoholic beverages -Take any medication unless instructed by your physician -Make any legal decisions or sign important papers.  Meals: Start with liquid foods such as gelatin or soup. Progress to regular foods as tolerated. Avoid greasy, spicy, heavy foods. If nausea and/or vomiting occur, drink only clear liquids until the nausea and/or vomiting subsides. Call your physician if vomiting continues.  Special Instructions/Symptoms: Your throat may feel dry or sore from the anesthesia or the breathing tube placed in your throat during surgery. If this causes discomfort, gargle with warm salt water. The discomfort should disappear within 24 hours.

## 2014-03-07 ENCOUNTER — Encounter (HOSPITAL_BASED_OUTPATIENT_CLINIC_OR_DEPARTMENT_OTHER): Payer: Self-pay | Admitting: Urology

## 2014-03-07 NOTE — Op Note (Signed)
Gabrielle [name], Gabrielle [name] NO.:  [id number]  MEDICAL RECORD NO.:  [id number]  LOCATION:                                 FACILITY:  PHYSICIAN:  Marshall Cork. Jeffie Pollock, M.D.    DATE OF BIRTH:  01-06-1956  DATE OF PROCEDURE: DATE OF DISCHARGE:                              OPERATIVE REPORT   PROCEDURE:  SPARC sling.  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  SURGEON:  Marshall Cork. Jeffie Pollock, M.D.  ANESTHESIA:  General.  SPECIMEN:  None.  DRAINS:  A 16-French Foley catheter and vaginal pack.  BLOOD LOSS:  50 mL.  COMPLICATIONS:  None.  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  FINDINGS OF PROCEDURE:  She was given Cipro.  She was taken to the operating room where general anesthetic was induced.  She was placed in lithotomy position and fitted with PAS hose.  Her mons was clipped.  Her lower abdomen and genitalia was prepped with Betadine solution and she was draped in the usual sterile fashion.  A 16-French Foley catheter was inserted and the bladder was drained.  A weighted vaginal retractor was then placed.  The anterior vaginal wall over the mid urethra was infiltrated with approximately 10 mL of 1% lidocaine with epinephrine.  Two small incisions were made at the upper border of the pubis, approximately 2 cm on either side of the midline. The fat was spread to the fascia with a hemostat.  A midline anterior vaginal wall incision was made over the mid urethral area.  Strully scissors were then used to pierce the pubourethral fascia sufficiently to allow tip of the finger into the space.  This was done bilaterally.  The [facility] trocars were then passed first on the right.  The  tip of the trocar was brought down to the superior edge of the pubis, walked along to the back of the pubis until a finger in the right side of the vaginal incision could palpate the tip.  The finger was used to protect the urethra.  The trocar was then passed into the vaginal incision.  This was then repeated identically on the left side.  Cystoscopy was then performed with a 22-French scope with a 70 degree lens.  Inspection revealed no trocar injury to the bladder or urethra.  The [facility] mesh which had been soaking in antibiotic solution was then secured to the trocars and pulled into position.  Cystoscopy was then repeated once again without evidence of bladder wall or urethral injury.  At this point, the Gabrielle [name] mesh was appropriately positioned and the sheath was removed after further irrigation with antibiotic solution.  Once the sling had been appropriately tensioned, the vaginal incision was closed using a running locked 2-0 Vicryl suture.  The Foley had been replaced prior to the tensioning procedure and the bladder had been drained.  The abdominal incisions were cleaned, dried, and closed with Dermabond. A 2 inch iodoform vaginal pack was then placed.  The  Foley catheter was placed to straight drainage.  The patient was taken down from lithotomy position.  Her anesthetic was reversed.  She was moved to recovery room in stable condition.  There were no complications.     Marshall Cork. Jeffie Pollock, M.D.     JJW/MEDQ  D:  03/06/2014  T:  03/06/2014  Job:  [id number]

## 2014-10-10 ENCOUNTER — Other Ambulatory Visit: Payer: Self-pay | Admitting: Family Medicine

## 2014-11-05 ENCOUNTER — Ambulatory Visit (INDEPENDENT_AMBULATORY_CARE_PROVIDER_SITE_OTHER): Payer: BC Managed Care – PPO | Admitting: Family Medicine

## 2014-11-05 VITALS — BP 112/64 | HR 77 | Temp 98.3°F | Resp 16 | Ht 58.75 in | Wt 137.6 lb

## 2014-11-05 DIAGNOSIS — E119 Type 2 diabetes mellitus without complications: Secondary | ICD-10-CM

## 2014-11-05 DIAGNOSIS — E785 Hyperlipidemia, unspecified: Secondary | ICD-10-CM

## 2014-11-05 DIAGNOSIS — L309 Dermatitis, unspecified: Secondary | ICD-10-CM

## 2014-11-05 DIAGNOSIS — Z23 Encounter for immunization: Secondary | ICD-10-CM

## 2014-11-05 DIAGNOSIS — I10 Essential (primary) hypertension: Secondary | ICD-10-CM

## 2014-11-05 LAB — COMPREHENSIVE METABOLIC PANEL
ALT: 34 U/L (ref 0–35)
AST: 53 U/L — ABNORMAL HIGH (ref 0–37)
Albumin: 4.6 g/dL (ref 3.5–5.2)
Alkaline Phosphatase: 111 U/L (ref 39–117)
BILIRUBIN TOTAL: 0.5 mg/dL (ref 0.2–1.2)
BUN: 8 mg/dL (ref 6–23)
CO2: 24 meq/L (ref 19–32)
Calcium: 10.1 mg/dL (ref 8.4–10.5)
Chloride: 100 mEq/L (ref 96–112)
Creat: 0.64 mg/dL (ref 0.50–1.10)
Glucose, Bld: 106 mg/dL — ABNORMAL HIGH (ref 70–99)
POTASSIUM: 4.3 meq/L (ref 3.5–5.3)
SODIUM: 137 meq/L (ref 135–145)
TOTAL PROTEIN: 8.5 g/dL — AB (ref 6.0–8.3)

## 2014-11-05 LAB — MICROALBUMIN, URINE: Microalb, Ur: 1.2 mg/dL (ref <2.0)

## 2014-11-05 LAB — LIPID PANEL
Cholesterol: 198 mg/dL (ref 0–200)
HDL: 32 mg/dL — ABNORMAL LOW (ref >39)
LDL Cholesterol: 136 mg/dL — ABNORMAL HIGH (ref 0–99)
Total CHOL/HDL Ratio: 6.2 Ratio
Triglycerides: 151 mg/dL — ABNORMAL HIGH (ref <150)
VLDL: 30 mg/dL (ref 0–40)

## 2014-11-05 LAB — POCT GLYCOSYLATED HEMOGLOBIN (HGB A1C): Hemoglobin A1C: 7.7

## 2014-11-05 MED ORDER — GLUCOSE BLOOD VI STRP: ORAL_STRIP | Status: DC

## 2014-11-05 MED ORDER — CLOBETASOL PROPIONATE 0.05 % EX OINT: 1.0000 "application " | TOPICAL_OINTMENT | CUTANEOUS | Status: DC | PRN

## 2014-11-05 MED ORDER — CLOBETASOL PROPIONATE 0.05 % EX CREA: 1.0000 "application " | TOPICAL_CREAM | CUTANEOUS | Status: DC | PRN

## 2014-11-05 MED ORDER — ATORVASTATIN CALCIUM 10 MG PO TABS: 10.0000 mg | ORAL_TABLET | Freq: Every morning | ORAL | Status: DC

## 2014-11-05 MED ORDER — AMLODIPINE BESYLATE 10 MG PO TABS: 10.0000 mg | ORAL_TABLET | Freq: Every day | ORAL | Status: DC

## 2014-11-05 MED ORDER — INSULIN GLARGINE 100 UNIT/ML ~~LOC~~ SOLN: SUBCUTANEOUS | Status: DC

## 2014-11-05 MED ORDER — LISINOPRIL 10 MG PO TABS: 10.0000 mg | ORAL_TABLET | Freq: Every morning | ORAL | Status: DC

## 2014-11-05 MED ORDER — METFORMIN HCL 1000 MG PO TABS: 1000.0000 mg | ORAL_TABLET | Freq: Two times a day (BID) | ORAL | Status: DC

## 2014-11-05 NOTE — Progress Notes (Signed)
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 She has generally been doing well. She takes her medicines faithfully. She did not come in during the past year since her physical year ago. She says she is not due a full physical yet. She goes to an eye doctor at [facility] on a about a yearly basis. She is gone for the last couple of years she says. She wears glasses. She has not had any peripheral neuropathy symptoms. She checks her sugars before bedtime and it usually runs in the 130s. She gets a lot of exercise walking on campus.  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 She's been using clobetasol ointment on this at bedtime along with some other hand cream. She questioned whether she could get some [place] I explained to her that she would need to discuss this with a dermatologist, that I do not prescribe that. However I did explain that it is a medication that has risks to it, so needs to be carefully considered before taking. I would advise her using her clobetasol twice daily for a while and see if that will help her. She used to have a dermatologist to is no longer in practice.  Objective: Pleasant alert lady in no major distress. Her neck was supple without nodes or thyromegaly. Chest is clear to auscultation. Heart regular without murmurs gallops arrhythmias. Feet have good pulses. Sensory using the filament test is normal.  Assessment: Type 2 diabetes mellitus Hand eczema  Plan: Use the clobetasol twice daily Urged her to get a flu shot. She feels like they will make her sick and she declines despite my urging. Urged her to get pneumonia vaccine She should continue getting annual visual care to rule out any diabetic involvement Continue current medications Dermatology referral

## 2014-11-05 NOTE — Patient Instructions (Signed)
Consider seriously getting the flu shot  Recommend Pneumovax today  Continue current medications  Use the clobetasol twice daily  Referral will be made for a new dermatologist for you since this is a long-term problem  It is strongly recommended that a diabetic be rechecked at least 2-4 times a year. Last year you did not come back in his directed. Please plan to return in about April for a recheck.

## 2014-11-10 ENCOUNTER — Encounter: Payer: Self-pay | Admitting: Family Medicine

## 2015-02-02 ENCOUNTER — Encounter (HOSPITAL_COMMUNITY): Payer: Self-pay | Admitting: Emergency Medicine

## 2015-02-02 ENCOUNTER — Inpatient Hospital Stay (HOSPITAL_COMMUNITY): Payer: BC Managed Care – PPO

## 2015-02-02 ENCOUNTER — Emergency Department (HOSPITAL_COMMUNITY): Payer: BC Managed Care – PPO

## 2015-02-02 ENCOUNTER — Inpatient Hospital Stay (HOSPITAL_COMMUNITY)
Admission: EM | Admit: 2015-02-02 | Discharge: 2015-02-05 | DRG: 872 | Disposition: A | Discharge status: Home / Self Care | Payer: BC Managed Care – PPO | Attending: Internal Medicine | Admitting: Internal Medicine

## 2015-02-02 DIAGNOSIS — I745 Embolism and thrombosis of iliac artery: Secondary | ICD-10-CM | POA: Diagnosis present

## 2015-02-02 DIAGNOSIS — I10 Essential (primary) hypertension: Secondary | ICD-10-CM | POA: Diagnosis present

## 2015-02-02 DIAGNOSIS — A419 Sepsis, unspecified organism: Principal | ICD-10-CM | POA: Diagnosis present

## 2015-02-02 DIAGNOSIS — E119 Type 2 diabetes mellitus without complications: Secondary | ICD-10-CM

## 2015-02-02 DIAGNOSIS — N393 Stress incontinence (female) (male): Secondary | ICD-10-CM | POA: Diagnosis present

## 2015-02-02 DIAGNOSIS — Z90710 Acquired absence of both cervix and uterus: Secondary | ICD-10-CM | POA: Diagnosis not present

## 2015-02-02 DIAGNOSIS — N10 Acute tubulo-interstitial nephritis: Secondary | ICD-10-CM | POA: Diagnosis present

## 2015-02-02 DIAGNOSIS — R109 Unspecified abdominal pain: Secondary | ICD-10-CM | POA: Diagnosis present

## 2015-02-02 DIAGNOSIS — N390 Urinary tract infection, site not specified: Secondary | ICD-10-CM

## 2015-02-02 DIAGNOSIS — R55 Syncope and collapse: Secondary | ICD-10-CM | POA: Diagnosis present

## 2015-02-02 DIAGNOSIS — E7800 Pure hypercholesterolemia, unspecified: Secondary | ICD-10-CM | POA: Diagnosis present

## 2015-02-02 DIAGNOSIS — Z794 Long term (current) use of insulin: Secondary | ICD-10-CM

## 2015-02-02 DIAGNOSIS — E785 Hyperlipidemia, unspecified: Secondary | ICD-10-CM | POA: Diagnosis present

## 2015-02-02 DIAGNOSIS — Z87891 Personal history of nicotine dependence: Secondary | ICD-10-CM | POA: Diagnosis not present

## 2015-02-02 DIAGNOSIS — D72829 Elevated white blood cell count, unspecified: Secondary | ICD-10-CM

## 2015-02-02 DIAGNOSIS — I70219 Atherosclerosis of native arteries of extremities with intermittent claudication, unspecified extremity: Secondary | ICD-10-CM | POA: Diagnosis present

## 2015-02-02 DIAGNOSIS — E739 Lactose intolerance, unspecified: Secondary | ICD-10-CM | POA: Diagnosis present

## 2015-02-02 LAB — CBC WITH DIFFERENTIAL/PLATELET
Basophils Absolute: 0 10*3/uL (ref 0.0–0.1)
Basophils Relative: 0 % (ref 0–1)
EOS ABS: 0.1 10*3/uL (ref 0.0–0.7)
Eosinophils Relative: 0 % (ref 0–5)
HEMATOCRIT: 36.2 % (ref 36.0–46.0)
Hemoglobin: 11.6 g/dL — ABNORMAL LOW (ref 12.0–15.0)
LYMPHS ABS: 2.3 10*3/uL (ref 0.7–4.0)
Lymphocytes Relative: 12 % (ref 12–46)
MCH: 27.9 pg (ref 26.0–34.0)
MCHC: 32 g/dL (ref 30.0–36.0)
MCV: 87 fL (ref 78.0–100.0)
MONOS PCT: 4 % (ref 3–12)
Monocytes Absolute: 0.8 10*3/uL (ref 0.1–1.0)
NEUTROS PCT: 84 % — AB (ref 43–77)
Neutro Abs: 16.3 10*3/uL — ABNORMAL HIGH (ref 1.7–7.7)
Platelets: 361 10*3/uL (ref 150–400)
RBC: 4.16 MIL/uL (ref 3.87–5.11)
RDW: 12.7 % (ref 11.5–15.5)
WBC: 19.4 10*3/uL — AB (ref 4.0–10.5)

## 2015-02-02 LAB — URINE MICROSCOPIC-ADD ON

## 2015-02-02 LAB — COMPREHENSIVE METABOLIC PANEL
ALK PHOS: 123 U/L — AB (ref 39–117)
ALT: 28 U/L (ref 0–35)
ANION GAP: 16 — AB (ref 5–15)
AST: 49 U/L — ABNORMAL HIGH (ref 0–37)
Albumin: 4 g/dL (ref 3.5–5.2)
BILIRUBIN TOTAL: 0.5 mg/dL (ref 0.3–1.2)
BUN: 10 mg/dL (ref 6–23)
CHLORIDE: 102 mmol/L (ref 96–112)
CO2: 20 mmol/L (ref 19–32)
Calcium: 9.6 mg/dL (ref 8.4–10.5)
Creatinine, Ser: 0.83 mg/dL (ref 0.50–1.10)
GFR calc non Af Amer: 76 mL/min — ABNORMAL LOW (ref >90)
GFR, EST AFRICAN AMERICAN: 88 mL/min — AB (ref >90)
GLUCOSE: 169 mg/dL — AB (ref 70–99)
POTASSIUM: 3.8 mmol/L (ref 3.5–5.1)
SODIUM: 138 mmol/L (ref 135–145)
TOTAL PROTEIN: 8.2 g/dL (ref 6.0–8.3)

## 2015-02-02 LAB — WET PREP, GENITAL
Trich, Wet Prep: NONE SEEN
Yeast Wet Prep HPF POC: NONE SEEN

## 2015-02-02 LAB — LIPASE, BLOOD: Lipase: 26 U/L (ref 11–59)

## 2015-02-02 LAB — URINALYSIS, ROUTINE W REFLEX MICROSCOPIC
GLUCOSE, UA: NEGATIVE mg/dL
Ketones, ur: 40 mg/dL — AB
Nitrite: POSITIVE — AB
PH: 5 (ref 5.0–8.0)
Protein, ur: >300 mg/dL — AB
Specific Gravity, Urine: 1.017 (ref 1.005–1.030)
Urobilinogen, UA: 1 mg/dL (ref 0.0–1.0)

## 2015-02-02 LAB — LACTIC ACID, PLASMA: Lactic Acid, Venous: 3 mmol/L (ref 0.5–2.0)

## 2015-02-02 LAB — I-STAT CG4 LACTIC ACID, ED
LACTIC ACID, VENOUS: 2.45 mmol/L — AB (ref 0.5–2.0)
Lactic Acid, Venous: 3.22 mmol/L (ref 0.5–2.0)

## 2015-02-02 LAB — PROCALCITONIN: Procalcitonin: <0.1 ng/mL

## 2015-02-02 LAB — GLUCOSE, CAPILLARY: Glucose-Capillary: 135 mg/dL — ABNORMAL HIGH (ref 70–99)

## 2015-02-02 MED ORDER — IOHEXOL 300 MG/ML  SOLN
100.0000 mL | Freq: Once | INTRAMUSCULAR | Status: AC | PRN
  Administered 2015-02-02: 100 mL via INTRAVENOUS

## 2015-02-02 MED ORDER — KETOROLAC TROMETHAMINE 15 MG/ML IJ SOLN
15.0000 mg | Freq: Once | INTRAMUSCULAR | Status: AC
  Administered 2015-02-02: 15 mg via INTRAVENOUS
  Filled 2015-02-02: qty 1

## 2015-02-02 MED ORDER — SODIUM CHLORIDE 0.9 % IV BOLUS (SEPSIS)
1000.0000 mL | Freq: Once | INTRAVENOUS | Status: AC
  Administered 2015-02-02: 1000 mL via INTRAVENOUS

## 2015-02-02 MED ORDER — HYDROMORPHONE HCL 1 MG/ML IJ SOLN
0.5000 mg | Freq: Once | INTRAMUSCULAR | Status: AC
  Administered 2015-02-02: 0.5 mg via INTRAVENOUS
  Filled 2015-02-02: qty 1

## 2015-02-02 MED ORDER — ACETAMINOPHEN 650 MG RE SUPP: 650.0000 mg | Freq: Four times a day (QID) | RECTAL | Status: DC | PRN

## 2015-02-02 MED ORDER — SODIUM CHLORIDE 0.9 % IV SOLN
INTRAVENOUS | Status: DC
  Administered 2015-02-02 – 2015-02-04 (×4): via INTRAVENOUS

## 2015-02-02 MED ORDER — ENOXAPARIN SODIUM 40 MG/0.4ML ~~LOC~~ SOLN
40.0000 mg | SUBCUTANEOUS | Status: DC
  Administered 2015-02-02 – 2015-02-04 (×3): 40 mg via SUBCUTANEOUS
  Filled 2015-02-02 (×4): qty 0.4

## 2015-02-02 MED ORDER — ONDANSETRON HCL 4 MG/2ML IJ SOLN
4.0000 mg | Freq: Once | INTRAMUSCULAR | Status: AC
  Administered 2015-02-02: 4 mg via INTRAVENOUS
  Filled 2015-02-02: qty 2

## 2015-02-02 MED ORDER — ONDANSETRON HCL 4 MG/2ML IJ SOLN
4.0000 mg | Freq: Four times a day (QID) | INTRAMUSCULAR | Status: DC | PRN
  Administered 2015-02-02 – 2015-02-03 (×3): 4 mg via INTRAVENOUS
  Filled 2015-02-02 (×3): qty 2

## 2015-02-02 MED ORDER — INSULIN GLARGINE 100 UNIT/ML ~~LOC~~ SOLN
15.0000 [IU] | Freq: Every day | SUBCUTANEOUS | Status: DC
  Administered 2015-02-02 – 2015-02-04 (×3): 15 [IU] via SUBCUTANEOUS
  Filled 2015-02-02 (×3): qty 0.15

## 2015-02-02 MED ORDER — HYDROMORPHONE HCL 1 MG/ML IJ SOLN
1.0000 mg | INTRAMUSCULAR | Status: DC | PRN
  Administered 2015-02-02 – 2015-02-03 (×4): 1 mg via INTRAVENOUS
  Filled 2015-02-02 (×4): qty 1

## 2015-02-02 MED ORDER — CEFTRIAXONE SODIUM IN DEXTROSE 20 MG/ML IV SOLN: 1.0000 g | INTRAVENOUS | Status: DC

## 2015-02-02 MED ORDER — IOHEXOL 300 MG/ML  SOLN
50.0000 mL | Freq: Once | INTRAMUSCULAR | Status: AC | PRN
  Administered 2015-02-02: 50 mL via ORAL

## 2015-02-02 MED ORDER — SODIUM CHLORIDE 0.9 % IV BOLUS (SEPSIS)
1000.0000 mL | INTRAVENOUS | Status: AC
  Administered 2015-02-02: 1000 mL via INTRAVENOUS

## 2015-02-02 MED ORDER — LISINOPRIL 10 MG PO TABS
10.0000 mg | ORAL_TABLET | Freq: Every morning | ORAL | Status: DC
  Administered 2015-02-03 – 2015-02-05 (×3): 10 mg via ORAL
  Filled 2015-02-02 (×3): qty 1

## 2015-02-02 MED ORDER — DEXTROSE 5 % IV SOLN
1.0000 g | Freq: Every day | INTRAVENOUS | Status: DC
  Administered 2015-02-02 – 2015-02-04 (×3): 1 g via INTRAVENOUS
  Filled 2015-02-02 (×4): qty 10

## 2015-02-02 MED ORDER — PHENAZOPYRIDINE HCL 200 MG PO TABS
200.0000 mg | ORAL_TABLET | Freq: Three times a day (TID) | ORAL | Status: DC
  Administered 2015-02-02 – 2015-02-05 (×8): 200 mg via ORAL
  Filled 2015-02-02 (×11): qty 1

## 2015-02-02 MED ORDER — ATORVASTATIN CALCIUM 10 MG PO TABS
10.0000 mg | ORAL_TABLET | Freq: Every morning | ORAL | Status: DC
Start: 2015-02-03 — End: 2015-02-05
  Administered 2015-02-03 – 2015-02-05 (×3): 10 mg via ORAL
  Filled 2015-02-02 (×3): qty 1

## 2015-02-02 MED ORDER — INSULIN ASPART 100 UNIT/ML ~~LOC~~ SOLN
0.0000 [IU] | Freq: Three times a day (TID) | SUBCUTANEOUS | Status: DC
  Administered 2015-02-03: 2 [IU] via SUBCUTANEOUS
  Administered 2015-02-03: 3 [IU] via SUBCUTANEOUS
  Administered 2015-02-03: 2 [IU] via SUBCUTANEOUS
  Administered 2015-02-04: 3 [IU] via SUBCUTANEOUS
  Administered 2015-02-05: 2 [IU] via SUBCUTANEOUS

## 2015-02-02 MED ORDER — ACETAMINOPHEN 325 MG PO TABS
650.0000 mg | ORAL_TABLET | Freq: Four times a day (QID) | ORAL | Status: DC | PRN
  Administered 2015-02-05: 650 mg via ORAL
  Filled 2015-02-02: qty 2

## 2015-02-02 MED ORDER — HYDROCODONE-ACETAMINOPHEN 5-325 MG PO TABS
1.0000 | ORAL_TABLET | ORAL | Status: DC | PRN
  Administered 2015-02-04 (×3): 1 via ORAL
  Filled 2015-02-02: qty 1
  Filled 2015-02-02: qty 2
  Filled 2015-02-02: qty 1

## 2015-02-02 MED ORDER — AMLODIPINE BESYLATE 10 MG PO TABS
10.0000 mg | ORAL_TABLET | Freq: Every day | ORAL | Status: DC
Start: 2015-02-03 — End: 2015-02-05
  Administered 2015-02-03 – 2015-02-05 (×3): 10 mg via ORAL
  Filled 2015-02-02 (×3): qty 1

## 2015-02-02 MED ORDER — INSULIN ASPART 100 UNIT/ML ~~LOC~~ SOLN: 0.0000 [IU] | Freq: Every day | SUBCUTANEOUS | Status: DC

## 2015-02-02 MED ORDER — ONDANSETRON HCL 4 MG PO TABS: 4.0000 mg | ORAL_TABLET | Freq: Four times a day (QID) | ORAL | Status: DC | PRN

## 2015-02-02 NOTE — Progress Notes (Signed)
ANTIBIOTIC CONSULT NOTE - INITIAL  Pharmacy Consult for Ceftriaxone Indication: Urosepsis  No Known Allergies  Patient Measurements: Weight: 131 lb (59.421 kg) Height: 4' 10.75"  Vital Signs: Temp: 98.6 F (37 C) (03/21 1543) Temp Source: Oral (03/21 1543) BP: 97/58 mmHg (03/21 1543) Pulse Rate: 87 (03/21 1543) Intake/Output from previous day:   Intake/Output from this shift:    Labs:  Recent Labs  02/02/15 1201  WBC 19.4*  HGB 11.6*  PLT 361  CREATININE 0.83   Estimated Creatinine Clearance: 57.5 mL/min (by C-G formula based on Cr of 0.83). No results for input(s): VANCOTROUGH, VANCOPEAK, VANCORANDOM, GENTTROUGH, GENTPEAK, GENTRANDOM, TOBRATROUGH, TOBRAPEAK, TOBRARND, AMIKACINPEAK, AMIKACINTROU, AMIKACIN in the last 72 hours.   Microbiology: Recent Results (from the past 720 hour(s))  Wet prep, genital     Status: Abnormal   Collection Time: 02/02/15  1:08 PM  Result Value Ref Range Status   Yeast Wet Prep HPF POC NONE SEEN NONE SEEN Final   Trich, Wet Prep NONE SEEN NONE SEEN Final   Clue Cells Wet Prep HPF POC FEW (A) NONE SEEN Final   WBC, Wet Prep HPF POC FEW (A) NONE SEEN Final    Medical History: Past Medical History  Diagnosis Date  . Lactose intolerance   . Hypertension   . Hyperlipidemia   . Type 2 diabetes mellitus   . RBBB   . SUI (stress urinary incontinence, female)   . Intrinsic (urethral) sphincter deficiency (ISD)   . Wears glasses   . Wears dentures     UPPER  . Eczema     Assessment: 65 y/oF with PMH of DM Type II, HTN, stress incontinence, UTI who presents to [facility] ED with acute onset severe suprapubic pain since this AM, increased urinary frequency, and sensation of incomplete bladder emptying. Patient found to have low normal BP, significant leukocytosis, and elevated lactic acid in ED. UA suggestive of UTI. Pharmacy consulted to dose Ceftriaxone for urosepsis.  3/21 >> Ceftriaxone >>  Tmax: 98.35F WBCs: elevated, 19.4K Renal: SCr  0.83, CrCl ~ 58 mL/min CG  3/21 blood x 2: sent 3/21 urine: sent  3/21 wet prep, genital: few clue cells, WBC  Goal of Therapy:  Appropriate antibiotic dosing for renal function and indication Eradication of infection  Plan:   Ceftriaxone 1g IV q24h.  No further dose adjustments needed for renal function. Pharmacy will follow culture results and clinical course peripherally.  Thank you for the consult.   Lindell Spar, PharmD, BCPS Pager: [phone] 02/02/2015 6:42 PM

## 2015-02-02 NOTE — ED Notes (Signed)
Per ems pt from work, co abdominal /pelvic pain, also co urinary frequency, dysuria. No blood no odor in urine. Hx bladder meche 1 year ago. Hx UTI. Diabetica and hx HTN>  Pt alert and oriented.

## 2015-02-02 NOTE — ED Provider Notes (Signed)
CSN: [id number]     Arrival date & time 02/02/15  1116 History   First MD Initiated Contact with Patient 02/02/15 1136     Chief Complaint  Patient presents with  . Abdominal Pain     (Consider location/radiation/quality/duration/timing/severity/associated sxs/prior Treatment) HPI  Gabrielle [name] is a 59 y.o. female  brought in by EMS for severe, acute onset of bilateral lower abdominal pain this a.m. associated with urinary frequency. Pain is 10 out of 10 and exacerbated with urination. Patient denies dysuria (note this contradicts triage note). Denies change in bowel habits, she is passing flatus normally. She denies fever, chills, similar prior episodes, abnormal vaginal discharge. Patient has had perineal mesh placement for treatment of urge incontinence 1 year ago.  Past Medical History  Diagnosis Date  . Lactose intolerance   . Hypertension   . Hyperlipidemia   . Type 2 diabetes mellitus   . RBBB   . SUI (stress urinary incontinence, female)   . Intrinsic (urethral) sphincter deficiency (ISD)   . Wears glasses   . Wears dentures     UPPER  . Eczema    Past Surgical History  Procedure Laterality Date  . Abdominoplasty  1999  . Abdominal hysterectomy  1998    fibroids  . Excision lower abdominal scar post abdominoplasty  01-05-2000  . Orif right ankle fx  12-24-2008  . Right ankle arthroscopy w/ debridement and open removal hardware  03-12-2010  . Tubal ligation    . Cardiovascular stress test  03-27-2008    NORMAL PERFUSION STUDY/  EF 54%  . Transthoracic echocardiogram  08-05-2009    MILD LVH/  EF 60-65%  . Bladder suspension N/A 03/06/2014    Procedure: [facility] SLING ;  Surgeon: Irine Seal, MD;  Location: [facility];  Service: Urology;  Laterality: N/A;   Family History  Problem Relation Age of Onset  . Diabetes Mother   . Diabetes Sister   . Diabetes Brother   . Lupus Daughter    History  Substance Use Topics  . Smoking status: Former Smoker  -- 20 years    Types: Cigarettes    Quit date: 11/14/2006  . Smokeless tobacco: Never Used  . Alcohol Use: No   OB History    No data available     Review of Systems  10 systems reviewed and found to be negative, except as noted in the HPI.   Allergies  Review of patient's allergies indicates no known allergies.  Home Medications   Prior to Admission medications   Medication Sig Start Date End Date Taking? Authorizing Provider  amLODipine (NORVASC) 10 MG tablet Take 1 tablet (10 mg total) by mouth daily. 11/05/14  Yes Posey Boyer, MD  atorvastatin (LIPITOR) 10 MG tablet Take 1 tablet (10 mg total) by mouth every morning. 11/05/14  Yes Posey Boyer, MD  clobetasol cream (TEMOVATE) 8.56 % Apply 1 application topically as needed. 11/05/14  Yes Posey Boyer, MD  clobetasol ointment (TEMOVATE) 3.14 % Apply 1 application topically as needed. 11/05/14  Yes Posey Boyer, MD  glucose blood (ACCU-CHEK SMARTVIEW) test strip PATIENT NEEDS OFFICE VISIT FOR ADDITIONAL REFILLS - very overdue for DM check up. 10/10/14  Yes Gay Filler Copland, MD  glucose blood (ACCU-CHEK SMARTVIEW) test strip USE ONE STRIP TO CHECK GLUCOSE THREE TIMES DAILY 11/05/14  Yes Posey Boyer, MD  insulin glargine (LANTUS) 100 UNIT/ML injection 15 units [place] nightly. Please dispense 5 pens. 11/05/14  Yes Shanon Brow  Burt Ek, MD  lisinopril (PRINIVIL,ZESTRIL) 10 MG tablet Take 1 tablet (10 mg total) by mouth every morning. 11/05/14  Yes Posey Boyer, MD  metFORMIN (GLUCOPHAGE) 1000 MG tablet Take 1 tablet (1,000 mg total) by mouth 2 (two) times daily with a meal. 11/05/14  Yes Posey Boyer, MD  triamcinolone cream (KENALOG) 0.5 % Apply 1 application topically as needed.   Yes Historical Provider, MD  docusate sodium (COLACE) 100 MG capsule Take 1 capsule (100 mg total) by mouth 2 (two) times daily. Patient not taking: Reported on 11/05/2014 03/06/14   Irine Seal, MD  HYDROcodone-acetaminophen [facility]) 5-325 MG per  tablet Take 1 tablet by mouth every 6 (six) hours as needed for moderate pain. Patient not taking: Reported on 11/05/2014 03/06/14   Irine Seal, MD  promethazine (PHENERGAN) 50 MG tablet Take 0.5 tablets (25 mg total) by mouth every 6 (six) hours as needed for nausea or vomiting. Patient not taking: Reported on 11/05/2014 03/06/14   Irine Seal, MD   BP 97/58 mmHg  Pulse 87  Temp(Src) 98.6 F (37 C) (Oral)  Resp 18  SpO2 95% Physical Exam  Constitutional: She is oriented to person, place, and time. She appears well-developed and well-nourished. No distress.  HENT:  Head: Normocephalic and atraumatic.  Mouth/Throat: Oropharynx is clear and moist.  Eyes: Conjunctivae and EOM are normal.  Cardiovascular: Normal rate.   Pulmonary/Chest: Effort normal. No stridor.  Abdominal: Soft. Bowel sounds are normal. She exhibits no mass. There is tenderness. There is guarding. There is no rebound.  Remote surgical scars from abdominoplasty.  Normoactive bowel sounds, patient is diffusely tender to palpation especially in the bilateral lower quadrants and suprapubic area, there is positive voluntary guarding throughout.  Genitourinary:  No CVA tenderness bilaterally.  Pelvic exam is chaperoned by technician: No rashes or lesions, no abnormal vaginal discharge, no cervical motion or adnexal tenderness.  Musculoskeletal: Normal range of motion.  Neurological: She is alert and oriented to person, place, and time.  Psychiatric: She has a normal mood and affect.  Nursing note and vitals reviewed.   ED Course  Procedures (including critical care time) Labs Review Labs Reviewed  WET PREP, GENITAL - Abnormal; Notable for the following:    Clue Cells Wet Prep HPF POC FEW (*)    WBC, Wet Prep HPF POC FEW (*)    All other components within normal limits  CBC WITH DIFFERENTIAL/PLATELET - Abnormal; Notable for the following:    WBC 19.4 (*)    Hemoglobin 11.6 (*)    Neutrophils Relative % 84 (*)     Neutro Abs 16.3 (*)    All other components within normal limits  COMPREHENSIVE METABOLIC PANEL - Abnormal; Notable for the following:    Glucose, Bld 169 (*)    AST 49 (*)    Alkaline Phosphatase 123 (*)    GFR calc non Af Amer 76 (*)    GFR calc Af Amer 88 (*)    Anion gap 16 (*)    All other components within normal limits  URINALYSIS, ROUTINE W REFLEX MICROSCOPIC - Abnormal; Notable for the following:    Color, Urine RED (*)    APPearance TURBID (*)    Hgb urine dipstick LARGE (*)    Bilirubin Urine LARGE (*)    Ketones, ur 40 (*)    Protein, ur >300 (*)    Nitrite POSITIVE (*)    Leukocytes, UA LARGE (*)    All other components within normal limits  URINE MICROSCOPIC-ADD ON - Abnormal; Notable for the following:    Bacteria, UA FEW (*)    All other components within normal limits  I-STAT CG4 LACTIC ACID, ED - Abnormal; Notable for the following:    Lactic Acid, Venous 3.22 (*)    All other components within normal limits  URINE CULTURE  CULTURE, BLOOD (ROUTINE X 2)  CULTURE, BLOOD (ROUTINE X 2)  LIPASE, BLOOD  GC/CHLAMYDIA PROBE AMP ([place])    Imaging Review Ct Abdomen Pelvis W Contrast  02/02/2015   CLINICAL DATA:  Abdominal and pelvic pain. Urinary frequency and dysuria.  EXAM: CT ABDOMEN AND PELVIS WITH CONTRAST  TECHNIQUE: Multidetector CT imaging of the abdomen and pelvis was performed using the standard protocol following bolus administration of intravenous contrast.  CONTRAST:  79mL OMNIPAQUE IOHEXOL 300 MG/ML SOLN, 125mL OMNIPAQUE IOHEXOL 300 MG/ML SOLN  COMPARISON:  CT abdomen pelvis - 12/01/2024  FINDINGS: Normal hepatic contour. There is diffuse decreased attenuation of the hepatic parenchyma on this postcontrast examination. Subcentimeter hypo attenuating lesions within the dome of the right lobe of the liver (images 13 and 15, series 2) are too small to accurately characterize, though unchanged and again favored to represent hepatic cysts. Normal appearance  of the gallbladder given degree distention. No radiopaque gallstones. No intra or extrahepatic biliary duct dilatation. No ascites.  There is symmetric enhancement and excretion of the bilateral kidneys. Bilateral subcentimeter hypo attenuating renal lesions are too small to adequately characterize of favored to represent renal cysts. Scattered vascular calcifications about the bilateral renal hila. No definite renal stones this postcontrast examination. No urinary obstruction or perinephric stranding. Normal appearance of the bilateral adrenal glands, pancreas and spleen. Ingested enteric contrast extends to the level of the mid small bowel. Moderate colonic stool burden without evidence of enteric obstruction. The bowel is normal in course and caliber without discrete area of wall thickening. Normal appearance of the appendix. No pneumoperitoneum, pneumatosis or portal venous gas.  Large amount of eccentric mixed calcified and noncalcified atherosclerotic plaque throughout the abdominal aorta including the regular intraluminal mixed calcified and noncalcified atherosclerotic plaque within the mid aspect of the infrarenal abdominal aorta (representative images 34 through 36, series 2), not definitely resulting in a hemodynamically significant stenosis. There are suspected tandem areas of hemodynamically significant stenosis involving the bilateral common and external iliac arteries. The right superficial femoral artery is occluded throughout its imaged course.  No retroperitoneal, mesenteric, pelvic or inguinal lymphadenopathy.  There is hyperemia involving in the inner surface of the urinary bladder, possibly accentuated due to phase of enhancement though cystitis could have a similar appearance. Post hysterectomy. No discrete adnexal lesion. No free fluid in the pelvic cul-de-sac.  Limited visualization of lower thorax demonstrates minimal dependent subpleural ground-glass atelectasis, right greater than left.  There is minimal subsegmental atelectasis within the imaged caudal aspect the right middle lobe. No discrete focal airspace opacities. No pleural effusion.  Normal heart size.  Coronary artery calcifications.  No acute or aggressive osseous abnormalities.  There is a punctate (approximately 0.8 cm) calcification within the subcutaneous tissues about the right lateral abdominal wall (image 41, series 2), unchanged. Regional soft tissues appear normal.  IMPRESSION: 1. Apparent hyperemia involving the wall of the urinary bladder, potentially accentuated due to underdistention and phase of enhancement though cystitis could have a similar appearance. Coordination with urinalysis is recommended. No evidence of urinary obstruction. 2. Moderate to large amount of age advanced atherosclerotic plaque throughout the normal caliber abdominal aorta. 3.  Suspected tandem areas of hemodynamically significant narrowings involving the bilateral common and external iliac arteries with occlusion of the right superficial femoral artery throughout its imaged course, similar to prior abdominal CT performed 11/2014. Correlation for symptoms of PAD is recommended. Further evaluation could be performed with dedicated nonemergent runoff CTA as clinically indicated. 4. Suspected hepatic steatosis. Correlation with LFTs is recommended.   Electronically Signed   By: Sandi Mariscal M.D.   On: 02/02/2015 14:30     EKG Interpretation None      MDM   Final diagnoses:  UTI (lower urinary tract infection)  Leukocytosis  Sepsis    Filed Vitals:   02/02/15 1122 02/02/15 1543  BP: 114/80 97/58  Pulse: 92 87  Temp: 98.5 F (36.9 C) 98.6 F (37 C)  TempSrc: Oral Oral  Resp: 18 18  SpO2: 100% 95%    Medications  phenazopyridine (PYRIDIUM) tablet 200 mg (200 mg Oral Given 02/02/15 1634)  sodium chloride 0.9 % bolus 1,000 mL (1,000 mLs Intravenous New Bag/Given 02/02/15 1729)  sodium chloride 0.9 % bolus 1,000 mL (not administered)   iohexol (OMNIPAQUE) 300 MG/ML solution 50 mL (50 mLs Oral Contrast Given 02/02/15 1402)  HYDROmorphone (DILAUDID) injection 0.5 mg (0.5 mg Intravenous Given 02/02/15 1312)  ondansetron (ZOFRAN) injection 4 mg (4 mg Intravenous Given 02/02/15 1312)  iohexol (OMNIPAQUE) 300 MG/ML solution 100 mL (100 mLs Intravenous Contrast Given 02/02/15 1402)  HYDROmorphone (DILAUDID) injection 0.5 mg (0.5 mg Intravenous Given 02/02/15 1504)  ketorolac (TORADOL) 15 MG/ML injection 15 mg (15 mg Intravenous Given 02/02/15 1634)    Gabrielle [name] is a pleasant 59 y.o. female presenting with severe lower abdominal pain with acute onset of urinary frequency. Abdominal exam is nonsurgical, however, she does have voluntary guarding, or tenderness seems to be worse in the bilateral lower quadrants. No CVA tenderness palpation, this would be a very atypical presentation of a urinary tract infection based on the severity of her pain in the acute onset.  Urinalysis is consistent with infection, prior urine culture shows Escherichia coli susceptible to Rocephin, urine culture is ordered today and she will be given 1 g Rocephin IV. Patient has a significant leukocytosis of 19.4. There is a left shift.  CT shows hyperemia around the wall of the bladder consistent with cystitis. Radiologist note several areas of atherosclerosis throughout the abdomen and pelvis. Patient has no claudication symptoms. Lactic acid is ordered to evaluate   Vascular consult from Dr. Bridgett Larsson appreciated: We have discussed the findings on the CAT scan. He doubts that the abdominal pain would be related to the findings here. States that the recent delirium would be much more likely which is clinically not consistent with this patient's clinical picture. He does recommend that this patient follow with vascular surgery as an outpatient.  Take that this patient needs admission to the hospital based on the severity of her pain, the multiple doses of Dilaudid that  is needed to control her pain, her white count of 19 and a neutrophilic predominance, and her elevated lactic acid of 3.2.  Pt will be admitted to Dr. Clementeen Graham to a med surg floor. Sepsis protocol with cultures initiated.     Monico Blitz, PA-C 02/02/15 1811  Ernestina Patches, MD 02/05/15 [phone]

## 2015-02-02 NOTE — H&P (Signed)
[facility] History and Physical  Gabrielle [name] ZDG:[id number] DOB: 12/27/1955 DOA: 02/02/2015  Referring physician: ED PCP: Lamar Blinks, MD   Chief Complaint:  Abdominal pain with increased frequency of urination since one day  HPI:  59 year old female with history of type 2 diabetes mellitus, hypertension, stress incontinence who presented to the ED with acute onset of severe suprapubic pain since this morning while she was at work. She also reports associated increased frequency of urination of almost 10 episodes with sensation of incomplete bladder emptying . The pain was excruciating 10/10 in severity, nonradiating and without any aggravating or relieving factors. She denies any hematuria or passing of kidney stones. Has history of UTI in the past but denies symptoms to be remotely similar to this. She denies any fever or chills, nausea or vomiting, chest pain, palpitations, diarrhea . Denies any sick contacts. Denies any change in her weight or appetite.   Course in the ED Patient's blood pressure was low normal of 97/58 mmHg. CBC showed leukocytosis with WBC of 19.4, who returned of 11.6 and normal platelets. Chemistry was normal except for anion gap of 16. Lactic acid was elevated to 3.2. Blood glucose was 169. Patient given IV Toradol and IV Dilaudid with minimal improvement in symptoms. UA was suggestive of UTI. Urine culture was sent and patient given IV Rocephin. Given persistent pain a CT scan of the abdomen and pelvis with contrast was done which showed hyperemia involving all of the urinary bladder suggestive of cystitis. Also showed incidental finding of his advanced atherosclerotic plaque throughout the abdominal aorta and significant narrowing of bilateral  common and external iliac arteries with occlusion of the right superficial femoral artery similar to prior abdominal CT from January 2016. The findings were discussed with vascular surgery Dr. Bridgett Larsson by ED physician  who recommended this is unlikely contributing to her pain symptoms and recommended outpatient follow-up with him.  Given low normal blood pressure, significant leukocytosis and elevated lactic acid patient met criteria for sepsis. Sepsis pathway was initiated and hospitalists admission requested to medical floor.  Review of Systems:  Constitutional: Denies fever, chills, diaphoresis, appetite change and fatigue.  HEENT: Denies visual or hearing symptoms, congestion, difficulty swallowing, neck pain  Respiratory: Denies SOB, DOE, cough, chest tightness,  and wheezing.   Cardiovascular: Denies chest pain, palpitations and leg swelling.  Gastrointestinal: Abdominal pain+++,  Denies nausea, vomiting,diarrhea, constipation, blood in stool and abdominal distention.  Genitourinary:urgency++, frequency ++, incomplete bladder emptying, Denies dysuria, hematuria, flank pain. Endocrine: Denies: hot or cold intolerance, , polyuria, polydipsia. Musculoskeletal: Denies myalgias, back pain, joint a non-or swelling Skin: Denies rash and wound.  Neurological: Denies dizziness,  syncope, weakness, light-headedness, numbness and headaches.  Hematological: Denies adenopathy.  Psychiatric/Behavioral: Denies confusion  Past Medical History  Diagnosis Date  . Lactose intolerance   . Hypertension   . Hyperlipidemia   . Type 2 diabetes mellitus   . RBBB   . SUI (stress urinary incontinence, female)   . Intrinsic (urethral) sphincter deficiency (ISD)   . Wears glasses   . Wears dentures     UPPER  . Eczema    Past Surgical History  Procedure Laterality Date  . Abdominoplasty  1999  . Abdominal hysterectomy  1998    fibroids  . Excision lower abdominal scar post abdominoplasty  01-05-2000  . Orif right ankle fx  12-24-2008  . Right ankle arthroscopy w/ debridement and open removal hardware  03-12-2010  . Tubal ligation    . Cardiovascular stress  test  03-27-2008    NORMAL PERFUSION STUDY/  EF 54%  .  Transthoracic echocardiogram  08-05-2009    MILD LVH/  EF 60-65%  . Bladder suspension N/A 03/06/2014    Procedure: [facility] SLING ;  Surgeon: Irine Seal, MD;  Location: [facility];  Service: Urology;  Laterality: N/A;   Social History:  reports that she quit smoking about 8 years ago. Her smoking use included Cigarettes. She quit after 20 years of use. She has never used smokeless tobacco. She reports that she does not drink alcohol or use illicit drugs.  No Known Allergies  Family History  Problem Relation Age of Onset  . Diabetes Mother   . Diabetes Sister   . Diabetes Brother   . Lupus Daughter     Prior to Admission medications   Medication Sig Start Date End Date Taking? Authorizing Provider  amLODipine (NORVASC) 10 MG tablet Take 1 tablet (10 mg total) by mouth daily. 11/05/14  Yes Posey Boyer, MD  atorvastatin (LIPITOR) 10 MG tablet Take 1 tablet (10 mg total) by mouth every morning. 11/05/14  Yes Posey Boyer, MD  clobetasol cream (TEMOVATE) 8.84 % Apply 1 application topically as needed. 11/05/14  Yes Posey Boyer, MD  clobetasol ointment (TEMOVATE) 1.66 % Apply 1 application topically as needed. 11/05/14  Yes Posey Boyer, MD  glucose blood (ACCU-CHEK SMARTVIEW) test strip PATIENT NEEDS OFFICE VISIT FOR ADDITIONAL REFILLS - very overdue for DM check up. 10/10/14  Yes Gay Filler Copland, MD  glucose blood (ACCU-CHEK SMARTVIEW) test strip USE ONE STRIP TO CHECK GLUCOSE THREE TIMES DAILY 11/05/14  Yes Posey Boyer, MD  insulin glargine (LANTUS) 100 UNIT/ML injection 15 units [place] nightly. Please dispense 5 pens. 11/05/14  Yes Posey Boyer, MD  lisinopril (PRINIVIL,ZESTRIL) 10 MG tablet Take 1 tablet (10 mg total) by mouth every morning. 11/05/14  Yes Posey Boyer, MD  metFORMIN (GLUCOPHAGE) 1000 MG tablet Take 1 tablet (1,000 mg total) by mouth 2 (two) times daily with a meal. 11/05/14  Yes Posey Boyer, MD  triamcinolone cream (KENALOG) 0.5 % Apply 1  application topically as needed.   Yes Historical Provider, MD  docusate sodium (COLACE) 100 MG capsule Take 1 capsule (100 mg total) by mouth 2 (two) times daily. Patient not taking: Reported on 11/05/2014 03/06/14   Irine Seal, MD  HYDROcodone-acetaminophen [facility]) 5-325 MG per tablet Take 1 tablet by mouth every 6 (six) hours as needed for moderate pain. Patient not taking: Reported on 11/05/2014 03/06/14   Irine Seal, MD  promethazine (PHENERGAN) 50 MG tablet Take 0.5 tablets (25 mg total) by mouth every 6 (six) hours as needed for nausea or vomiting. Patient not taking: Reported on 11/05/2014 03/06/14   Irine Seal, MD     Physical Exam:  Filed Vitals:   02/02/15 1122 02/02/15 1543 02/02/15 1819  BP: 114/80 97/58   Pulse: 92 87   Temp: 98.5 F (36.9 C) 98.6 F (37 C)   TempSrc: Oral Oral   Resp: 18 18   Weight:   59.421 kg (131 lb)  SpO2: 100% 95%     Constitutional: Vital signs reviewed. Middle aged female lying in bed in no distress HEENT: no pallor, no icterus, moist oral mucosa, no cervical lymphadenopathy Cardiovascular: RRR, S1 normal, S2 normal, no MRG Chest: CTAB, no wheezes, rales, or rhonchi Abdominal: Soft.  non-distended, bowel sounds are normal, no guarding or rigidity, infraumbilical surgical scar from prior tummy tuck,  tender to palpation over bilateral lower quadrant and suprapubic area, right CVA tenderness  Ext: warm, no edema Neurological: Alert and oriented  Labs on Admission:  Basic Metabolic Panel:  Recent Labs Lab 02/02/15 1201  NA 138  K 3.8  CL 102  CO2 20  GLUCOSE 169*  BUN 10  CREATININE 0.83  CALCIUM 9.6   Liver Function Tests:  Recent Labs Lab 02/02/15 1201  AST 49*  ALT 28  ALKPHOS 123*  BILITOT 0.5  PROT 8.2  ALBUMIN 4.0    Recent Labs Lab 02/02/15 1201  LIPASE 26   No results for input(s): AMMONIA in the last 168 hours. CBC:  Recent Labs Lab 02/02/15 1201  WBC 19.4*  NEUTROABS 16.3*  HGB 11.6*  HCT 36.2  MCV  87.0  PLT 361   Cardiac Enzymes: No results for input(s): CKTOTAL, CKMB, CKMBINDEX, TROPONINI in the last 168 hours. BNP: Invalid input(s): POCBNP CBG: No results for input(s): GLUCAP in the last 168 hours.  Radiological Exams on Admission: Ct Abdomen Pelvis W Contrast  02/02/2015   CLINICAL DATA:  Abdominal and pelvic pain. Urinary frequency and dysuria.  EXAM: CT ABDOMEN AND PELVIS WITH CONTRAST  TECHNIQUE: Multidetector CT imaging of the abdomen and pelvis was performed using the standard protocol following bolus administration of intravenous contrast.  CONTRAST:  84m OMNIPAQUE IOHEXOL 300 MG/ML SOLN, 1036mOMNIPAQUE IOHEXOL 300 MG/ML SOLN  COMPARISON:  CT abdomen pelvis - 12/01/2024  FINDINGS: Normal hepatic contour. There is diffuse decreased attenuation of the hepatic parenchyma on this postcontrast examination. Subcentimeter hypo attenuating lesions within the dome of the right lobe of the liver (images 13 and 15, series 2) are too small to accurately characterize, though unchanged and again favored to represent hepatic cysts. Normal appearance of the gallbladder given degree distention. No radiopaque gallstones. No intra or extrahepatic biliary duct dilatation. No ascites.  There is symmetric enhancement and excretion of the bilateral kidneys. Bilateral subcentimeter hypo attenuating renal lesions are too small to adequately characterize of favored to represent renal cysts. Scattered vascular calcifications about the bilateral renal hila. No definite renal stones this postcontrast examination. No urinary obstruction or perinephric stranding. Normal appearance of the bilateral adrenal glands, pancreas and spleen. Ingested enteric contrast extends to the level of the mid small bowel. Moderate colonic stool burden without evidence of enteric obstruction. The bowel is normal in course and caliber without discrete area of wall thickening. Normal appearance of the appendix. No pneumoperitoneum,  pneumatosis or portal venous gas.  Large amount of eccentric mixed calcified and noncalcified atherosclerotic plaque throughout the abdominal aorta including the regular intraluminal mixed calcified and noncalcified atherosclerotic plaque within the mid aspect of the infrarenal abdominal aorta (representative images 34 through 36, series 2), not definitely resulting in a hemodynamically significant stenosis. There are suspected tandem areas of hemodynamically significant stenosis involving the bilateral common and external iliac arteries. The right superficial femoral artery is occluded throughout its imaged course.  No retroperitoneal, mesenteric, pelvic or inguinal lymphadenopathy.  There is hyperemia involving in the inner surface of the urinary bladder, possibly accentuated due to phase of enhancement though cystitis could have a similar appearance. Post hysterectomy. No discrete adnexal lesion. No free fluid in the pelvic cul-de-sac.  Limited visualization of lower thorax demonstrates minimal dependent subpleural ground-glass atelectasis, right greater than left. There is minimal subsegmental atelectasis within the imaged caudal aspect the right middle lobe. No discrete focal airspace opacities. No pleural effusion.  Normal heart size.  Coronary artery calcifications.  No acute or aggressive osseous abnormalities.  There is a punctate (approximately 0.8 cm) calcification within the subcutaneous tissues about the right lateral abdominal wall (image 41, series 2), unchanged. Regional soft tissues appear normal.  IMPRESSION: 1. Apparent hyperemia involving the wall of the urinary bladder, potentially accentuated due to underdistention and phase of enhancement though cystitis could have a similar appearance. Coordination with urinalysis is recommended. No evidence of urinary obstruction. 2. Moderate to large amount of age advanced atherosclerotic plaque throughout the normal caliber abdominal aorta. 3. 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 Correlation for symptoms of PAD is recommended. Further evaluation could be performed with dedicated nonemergent runoff CTA as clinically indicated. 4. Suspected hepatic steatosis. Correlation with LFTs is recommended.   Electronically Signed   By: Sandi Mariscal M.D.   On: 02/02/2015 14:30      Assessment/Plan  Principal Problem:   Sepsis due to urinary tract infection/ acute pyelonephritis Admit to MedSurg. Sepsis pathway initiated. Order 2 units liters IV normal saline bolus and repeat lactic acid level. CT scan results reviewed. Follow urine culture. Continue empiric IV Rocephin. Supportive care with IV hydration and pain control with when necessary Vicodin and IV Dilaudid for severe symptoms. 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 Bridgett Larsson who recommended that her symptoms are unlikely related to bilateral iliac artery narrowing and recommended outpatient follow-up with him.  Essential hypertension Continue amlodipine  Diabetes mellitus Hold metformin. Continue bedtime Lantus and sliding scale insulin    Diet diabeticprophylaxis: sq lovenox   Code Status: full code Family Communication: discussed with husband and brother at bedside Disposition Plan: Admit to [place]. Home likely in next 48 -72 hours if clinically improved  Katriona Schmierer, [place] [facility] Pager [phone]  Total time spent on admission :60 minutes  If 7PM-7AM, please contact night-coverage [email or web address] Password [facility] 02/02/2015, 6:28 PM

## 2015-02-02 NOTE — ED Notes (Signed)
Bed: VV74 Expected date:  Expected time:  Means of arrival:  Comments: Ems- abd pain

## 2015-02-03 ENCOUNTER — Encounter (HOSPITAL_COMMUNITY): Payer: Self-pay | Admitting: *Deleted

## 2015-02-03 LAB — GC/CHLAMYDIA PROBE AMP (~~LOC~~) NOT AT ARMC
Chlamydia: NEGATIVE
NEISSERIA GONORRHEA: NEGATIVE

## 2015-02-03 LAB — CBC WITH DIFFERENTIAL/PLATELET
Basophils Absolute: 0 10*3/uL (ref 0.0–0.1)
Basophils Relative: 0 % (ref 0–1)
EOS ABS: 0.1 10*3/uL (ref 0.0–0.7)
EOS PCT: 1 % (ref 0–5)
HCT: 33.6 % — ABNORMAL LOW (ref 36.0–46.0)
Hemoglobin: 10.5 g/dL — ABNORMAL LOW (ref 12.0–15.0)
Lymphocytes Relative: 27 % (ref 12–46)
Lymphs Abs: 2.8 10*3/uL (ref 0.7–4.0)
MCH: 27.8 pg (ref 26.0–34.0)
MCHC: 31.3 g/dL (ref 30.0–36.0)
MCV: 88.9 fL (ref 78.0–100.0)
Monocytes Absolute: 0.4 10*3/uL (ref 0.1–1.0)
Monocytes Relative: 3 % (ref 3–12)
NEUTROS PCT: 69 % (ref 43–77)
Neutro Abs: 7.3 10*3/uL (ref 1.7–7.7)
PLATELETS: 312 10*3/uL (ref 150–400)
RBC: 3.78 MIL/uL — ABNORMAL LOW (ref 3.87–5.11)
RDW: 13 % (ref 11.5–15.5)
WBC: 10.5 10*3/uL (ref 4.0–10.5)

## 2015-02-03 LAB — BASIC METABOLIC PANEL
ANION GAP: 9 (ref 5–15)
BUN: 8 mg/dL (ref 6–23)
CHLORIDE: 106 mmol/L (ref 96–112)
CO2: 23 mmol/L (ref 19–32)
Calcium: 8.4 mg/dL (ref 8.4–10.5)
Creatinine, Ser: 0.67 mg/dL (ref 0.50–1.10)
GLUCOSE: 123 mg/dL — AB (ref 70–99)
POTASSIUM: 3.6 mmol/L (ref 3.5–5.1)
Sodium: 138 mmol/L (ref 135–145)

## 2015-02-03 LAB — GLUCOSE, CAPILLARY
Glucose-Capillary: 125 mg/dL — ABNORMAL HIGH (ref 70–99)
Glucose-Capillary: 126 mg/dL — ABNORMAL HIGH (ref 70–99)
Glucose-Capillary: 168 mg/dL — ABNORMAL HIGH (ref 70–99)
Glucose-Capillary: 136 mg/dL — ABNORMAL HIGH (ref 70–99)

## 2015-02-03 LAB — CBC
HEMATOCRIT: 33.2 % — AB (ref 36.0–46.0)
Hemoglobin: 10.3 g/dL — ABNORMAL LOW (ref 12.0–15.0)
MCH: 27.2 pg (ref 26.0–34.0)
MCHC: 31 g/dL (ref 30.0–36.0)
MCV: 87.8 fL (ref 78.0–100.0)
PLATELETS: 343 10*3/uL (ref 150–400)
RBC: 3.78 MIL/uL — ABNORMAL LOW (ref 3.87–5.11)
RDW: 12.9 % (ref 11.5–15.5)
WBC: 8.1 10*3/uL (ref 4.0–10.5)

## 2015-02-03 LAB — PROCALCITONIN

## 2015-02-03 NOTE — Progress Notes (Signed)
Gabrielle [name] SWH:[id number] DOB: 06-24-1956 DOA: 02/02/2015 PCP: Lamar Blinks, MD  Brief narrative:  59 y/o ? DM ty 2-insulin dependant, Htn, Urinary Incontinence s/p [facility] sling 03/06/14, neg Stress Myoview, Tobacco abuse, prior syncope 08/18/09, prior DKA  admit 02/02/15 c Abd pain + dysuria  Past medical history-As per Problem list Chart reviewed as below-   Consultants:    Procedures:    Antibiotics:  Ceftriaxone 3/21   Subjective   Feels about a third the way better close to her normal self. Tolerating some diet today and feels a little brighter and better No nausea no vomiting No chest pain   Objective    Interim History: None  Telemetry: Telemetry benign-discontinued 3/22   Objective: Filed Vitals:   02/02/15 1819 02/02/15 1856 02/02/15 1937 02/03/15 0521  BP:  107/58 113/56 124/56  Pulse:  81 73 71  Temp:  98.7 F (37.1 C) 98.1 F (36.7 C) 97.6 F (36.4 C)  TempSrc:  Oral Oral Oral  Resp:  18 20 20   Weight: 59.421 kg (131 lb)     SpO2:  95% 98% 94%    Intake/Output Summary (Last 24 hours) at 02/03/15 1116 Last data filed at 02/03/15 1029  Gross per 24 hour  Intake 4148.33 ml  Output      3 ml  Net 4145.33 ml    Exam:  General: EOMI NCAT Cardiovascular: S1-S2 no murmur rub or gallop Respiratory: Clinically clear Abdomen: Soft nontender nondistended mild CVA tenderness bilaterally Skin no lower extremity edema Neuro intact  Data Reviewed: Basic Metabolic Panel:  Recent Labs Lab 02/02/15 1201 02/03/15 0527  NA 138 138  K 3.8 3.6  CL 102 106  CO2 20 23  GLUCOSE 169* 123*  BUN 10 8  CREATININE 0.83 0.67  CALCIUM 9.6 8.4   Liver Function Tests:  Recent Labs Lab 02/02/15 1201  AST 49*  ALT 28  ALKPHOS 123*  BILITOT 0.5  PROT 8.2  ALBUMIN 4.0    Recent Labs Lab 02/02/15 1201  LIPASE 26   No results for input(s): AMMONIA in the last 168 hours. CBC:  Recent Labs Lab 02/02/15 1201 02/03/15 0527  WBC  19.4* 10.5  NEUTROABS 16.3* 7.3  HGB 11.6* 10.5*  HCT 36.2 33.6*  MCV 87.0 88.9  PLT 361 312   Cardiac Enzymes: No results for input(s): CKTOTAL, CKMB, CKMBINDEX, TROPONINI in the last 168 hours. BNP: Invalid input(s): POCBNP CBG:  Recent Labs Lab 02/02/15 2124 02/03/15 0731  GLUCAP 135* 125*    Recent Results (from the past 240 hour(s))  Wet prep, genital     Status: Abnormal   Collection Time: 02/02/15  1:08 PM  Result Value Ref Range Status   Yeast Wet Prep HPF POC NONE SEEN NONE SEEN Final   Trich, Wet Prep NONE SEEN NONE SEEN Final   Clue Cells Wet Prep HPF POC FEW (A) NONE SEEN Final   WBC, Wet Prep HPF POC FEW (A) NONE SEEN Final     Studies:              All Imaging reviewed and is as per above notation   Scheduled Meds: . amLODipine  10 mg Oral Daily  . atorvastatin  10 mg Oral q morning - 10a  . cefTRIAXone (ROCEPHIN)  IV  1 g Intravenous Daily  . enoxaparin (LOVENOX) injection  40 mg Subcutaneous Q24H  . insulin aspart  0-15 Units Subcutaneous TID WC  . insulin aspart  0-5 Units Subcutaneous QHS  .  insulin glargine  15 Units Subcutaneous QHS  . lisinopril  10 mg Oral q morning - 10a  . phenazopyridine  200 mg Oral TID WC   Continuous Infusions: . sodium chloride 100 mL/hr at 02/03/15 0000     Assessment/Plan:  1. Moderate sepsis secondary to pyelonephritis-keep on IV saline 100 cc per hour, trend lactic acid-continue empiric Rocephin. Follow cultures, repeat CBC plus differential a.m. 3/23-Pro calcitonin less than 0.10 therefore may be able to narrow antibiotics as early as in am 2. Bilateral iliac artery narrowing-outpatient follow-up with vascular surgeon Dr. Bridgett Larsson; 3. HTN-initially was slightly hypotensive currently blood pressures are a little better, reasonable to continue amlodipine 10 daily 4. Diabetes mellitus type 2, insulin-dependent-blood sugars 120-168. Well controlled. 5. Urinary incontinence status post [facility] sling 02/2014 outpatient  follow-up with urology 6. Prior syncope, negative stress Myoview in the past-outpatient follow-up 7. Prior tobacco abuse  Code Status: Full Family Communication: Discussed with husband at bedside Disposition Plan: Inpatient   Verneita Griffes, MD  [facility] Pager [phone] 02/03/2015, 11:16 AM    LOS: 1 day

## 2015-02-03 NOTE — Progress Notes (Signed)
UR complete 

## 2015-02-04 LAB — GLUCOSE, CAPILLARY
GLUCOSE-CAPILLARY: 156 mg/dL — AB (ref 70–99)
GLUCOSE-CAPILLARY: 97 mg/dL (ref 70–99)
Glucose-Capillary: 118 mg/dL — ABNORMAL HIGH (ref 70–99)
Glucose-Capillary: 182 mg/dL — ABNORMAL HIGH (ref 70–99)

## 2015-02-04 NOTE — Progress Notes (Signed)
PROGRESS NOTE  Gabrielle [name] IOM:[id number] DOB: 1955/12/28 DOA: 02/02/2015 PCP: Lamar Blinks, MD  HPI: 59 yo F admitted with sepsis due to pyelonephritis.  Subjective / 24 H Interval events - less abdominal pain today, but still with burning  Assessment/Plan: Principal Problem:   Sepsis due to urinary tract infection Active Problems:   Diabetes mellitus   High cholesterol   Hypertension   Stress incontinence in female   Essential hypertension   Bilateral iliac artery occlusion   Sepsis   Moderate sepsis secondary to pyelonephritis - keep on IV saline 100 cc per hour, trend lactic acid - continue empiric Rocephin.  - microbiology with E coli, final cultures pending  Bilateral iliac artery narrowing - outpatient follow-up with vascular surgeon Dr. Bridgett Larsson  HTN - initially was slightly hypotensive currently blood pressures are a little better, reasonable to continue amlodipine 10 daily  Diabetes mellitus type 2, insulin-dependent - blood sugars controlled.  Urinary incontinence status post [facility] sling 02/2014  - outpatient follow-up with urology  Prior syncope, negative stress Myoview in the past - outpatient follow-up  Prior tobacco abuse   Diet: Diet Carb Modified Fluids: NS 50 cc/h DVT Prophylaxis: Lovenox  Code Status: Full Code Family Communication: none bedside  Disposition Plan: home when ready   Consultants:  None   Procedures:  None    Antibiotics Ceftriaxone 3/21 >>   Studies  Ct Abdomen Pelvis W Contrast  02/02/2015   CLINICAL DATA:  Abdominal and pelvic pain. Urinary frequency and dysuria.  EXAM: CT ABDOMEN AND PELVIS WITH CONTRAST  TECHNIQUE: Multidetector CT imaging of the abdomen and pelvis was performed using the standard protocol following bolus administration of intravenous contrast.  CONTRAST:  55mL OMNIPAQUE IOHEXOL 300 MG/ML SOLN, 143mL OMNIPAQUE IOHEXOL 300 MG/ML SOLN  COMPARISON:  CT abdomen pelvis - 12/01/2024  FINDINGS:  Normal hepatic contour. There is diffuse decreased attenuation of the hepatic parenchyma on this postcontrast examination. Subcentimeter hypo attenuating lesions within the dome of the right lobe of the liver (images 13 and 15, series 2) are too small to accurately characterize, though unchanged and again favored to represent hepatic cysts. Normal appearance of the gallbladder given degree distention. No radiopaque gallstones. No intra or extrahepatic biliary duct dilatation. No ascites.  There is symmetric enhancement and excretion of the bilateral kidneys. Bilateral subcentimeter hypo attenuating renal lesions are too small to adequately characterize of favored to represent renal cysts. Scattered vascular calcifications about the bilateral renal hila. No definite renal stones this postcontrast examination. No urinary obstruction or perinephric stranding. Normal appearance of the bilateral adrenal glands, pancreas and spleen. Ingested enteric contrast extends to the level of the mid small bowel. Moderate colonic stool burden without evidence of enteric obstruction. The bowel is normal in course and caliber without discrete area of wall thickening. Normal appearance of the appendix. No pneumoperitoneum, pneumatosis or portal venous gas.  Large amount of eccentric mixed calcified and noncalcified atherosclerotic plaque throughout the abdominal aorta including the regular intraluminal mixed calcified and noncalcified atherosclerotic plaque within the mid aspect of the infrarenal abdominal aorta (representative images 34 through 36, series 2), not definitely resulting in a hemodynamically significant stenosis. There are suspected tandem areas of hemodynamically significant stenosis involving the bilateral common and external iliac arteries. The right superficial femoral artery is occluded throughout its imaged course.  No retroperitoneal, mesenteric, pelvic or inguinal lymphadenopathy.  There is hyperemia involving in  the inner surface of the urinary bladder, possibly accentuated due to phase of enhancement  though cystitis could have a similar appearance. Post hysterectomy. No discrete adnexal lesion. No free fluid in the pelvic cul-de-sac.  Limited visualization of lower thorax demonstrates minimal dependent subpleural ground-glass atelectasis, right greater than left. There is minimal subsegmental atelectasis within the imaged caudal aspect the right middle lobe. No discrete focal airspace opacities. No pleural effusion.  Normal heart size.  Coronary artery calcifications.  No acute or aggressive osseous abnormalities.  There is a punctate (approximately 0.8 cm) calcification within the subcutaneous tissues about the right lateral abdominal wall (image 41, series 2), unchanged. Regional soft tissues appear normal.  IMPRESSION: 1. Apparent hyperemia involving the wall of the urinary bladder, potentially accentuated due to underdistention and phase of enhancement though cystitis could have a similar appearance. Coordination with urinalysis is recommended. No evidence of urinary obstruction. 2. Moderate to large amount of age advanced atherosclerotic plaque throughout the normal caliber abdominal aorta. 3. Suspected tandem areas of hemodynamically significant narrowings involving the bilateral common and external iliac arteries with occlusion of the right superficial femoral artery throughout its imaged course, similar to prior abdominal CT performed 11/2014. Correlation for symptoms of PAD is recommended. Further evaluation could be performed with dedicated nonemergent runoff CTA as clinically indicated. 4. Suspected hepatic steatosis. Correlation with LFTs is recommended.   Electronically Signed   By: Sandi Mariscal M.D.   On: 02/02/2015 14:30   Dg Chest Port 1 View  02/02/2015   CLINICAL DATA:  Abdominal pain an urinary frequency.  EXAM: PORTABLE CHEST - 1 VIEW  COMPARISON:  03/27/2008  FINDINGS: The heart size and mediastinal  contours are within normal limits. Both lungs are clear. The visualized skeletal structures are unremarkable.  IMPRESSION: No acute cardiopulmonary findings.   Electronically Signed   By: Marijo Sanes M.D.   On: 02/02/2015 19:04   Objective  Filed Vitals:   02/03/15 0521 02/03/15 1308 02/03/15 2148 02/04/15 0557  BP: 124/56 124/68 106/64 112/79  Pulse: 71 72 72 74  Temp: 97.6 F (36.4 C) 98 F (36.7 C) 98.6 F (37 C) 97.8 F (36.6 C)  TempSrc: Oral Oral Oral Oral  Resp: 20 18 20 18   Weight:      SpO2: 94% 97% 98% 94%    Intake/Output Summary (Last 24 hours) at 02/04/15 1234 Last data filed at 02/04/15 0600  Gross per 24 hour  Intake   2740 ml  Output      1 ml  Net   2739 ml   Filed Weights   02/02/15 1819  Weight: 59.421 kg (131 lb)    Exam:  General:  NAD  HEENT: no scleral icterus, PERRL  Cardiovascular: RRR without MRG, 2+ peripheral pulses, no edema  Respiratory: CTA biL, good air movement, no wheezing, no crackles, no rales  Abdomen: soft, mild suprapubic tenderness  MSK/Extremities: no clubbing/cyanosis, no joint swelling  Skin: no rashes  Neuro: non focal, strength 5/5 in all 4   Data Reviewed: Basic Metabolic Panel:  Recent Labs Lab 02/02/15 1201 02/03/15 0527  NA 138 138  K 3.8 3.6  CL 102 106  CO2 20 23  GLUCOSE 169* 123*  BUN 10 8  CREATININE 0.83 0.67  CALCIUM 9.6 8.4   Liver Function Tests:  Recent Labs Lab 02/02/15 1201  AST 49*  ALT 28  ALKPHOS 123*  BILITOT 0.5  PROT 8.2  ALBUMIN 4.0    Recent Labs Lab 02/02/15 1201  LIPASE 26   CBC:  Recent Labs Lab 02/02/15 1201 02/03/15 0527 02/03/15  1805  WBC 19.4* 10.5 8.1  NEUTROABS 16.3* 7.3  --   HGB 11.6* 10.5* 10.3*  HCT 36.2 33.6* 33.2*  MCV 87.0 88.9 87.8  PLT 361 312 343   CBG:  Recent Labs Lab 02/03/15 1136 02/03/15 1618 02/03/15 2151 02/04/15 0724 02/04/15 1132  GLUCAP 126* 168* 136* 118* 156*    Recent Results (from the past 240 hour(s))    Wet prep, genital     Status: Abnormal   Collection Time: 02/02/15  1:08 PM  Result Value Ref Range Status   Yeast Wet Prep HPF POC NONE SEEN NONE SEEN Final   Trich, Wet Prep NONE SEEN NONE SEEN Final   Clue Cells Wet Prep HPF POC FEW (A) NONE SEEN Final   WBC, Wet Prep HPF POC FEW (A) NONE SEEN Final  Urine culture     Status: None (Preliminary result)   Collection Time: 02/02/15  1:50 PM  Result Value Ref Range Status   Specimen Description URINE, CATHETERIZED  Final   Special Requests NONE  Final   Colony Count   Final    >=100,000 COLONIES/ML Performed at [facility]    Culture   Final    ESCHERICHIA COLI Performed at [facility]    Report Status PENDING  Incomplete  Blood Culture (routine x 2)     Status: None (Preliminary result)   Collection Time: 02/02/15  6:19 PM  Result Value Ref Range Status   Specimen Description BLOOD R HAND  Final   Special Requests BOTTLES DRAWN AEROBIC AND ANAEROBIC 4ML  Final   Culture   Final           BLOOD CULTURE RECEIVED NO GROWTH TO DATE CULTURE WILL BE HELD FOR 5 DAYS BEFORE ISSUING A FINAL NEGATIVE REPORT Performed at [facility]    Report Status PENDING  Incomplete  Blood Culture (routine x 2)     Status: None (Preliminary result)   Collection Time: 02/02/15  6:19 PM  Result Value Ref Range Status   Specimen Description BLOOD L HAND  Final   Special Requests BOTTLES DRAWN AEROBIC AND ANAEROBIC 4ML  Final   Culture   Final           BLOOD CULTURE RECEIVED NO GROWTH TO DATE CULTURE WILL BE HELD FOR 5 DAYS BEFORE ISSUING A FINAL NEGATIVE REPORT Performed at [facility]    Report Status PENDING  Incomplete     Scheduled Meds: . amLODipine  10 mg Oral Daily  . atorvastatin  10 mg Oral q morning - 10a  . cefTRIAXone (ROCEPHIN)  IV  1 g Intravenous Daily  . enoxaparin (LOVENOX) injection  40 mg Subcutaneous Q24H  . insulin aspart  0-15 Units Subcutaneous TID WC  . insulin aspart  0-5 Units  Subcutaneous QHS  . insulin glargine  15 Units Subcutaneous QHS  . lisinopril  10 mg Oral q morning - 10a  . phenazopyridine  200 mg Oral TID WC   Continuous Infusions: . sodium chloride 100 mL/hr at 02/04/15 4944    Marzetta Board, MD [facility] Pager [phone]. If 7 PM - 7 AM, please contact night-coverage at [email or web address], password [facility] 02/04/2015, 12:34 PM  LOS: 2 days

## 2015-02-05 LAB — URINE CULTURE

## 2015-02-05 LAB — PROCALCITONIN: Procalcitonin: <0.1 ng/mL

## 2015-02-05 LAB — GLUCOSE, CAPILLARY: Glucose-Capillary: 140 mg/dL — ABNORMAL HIGH (ref 70–99)

## 2015-02-05 MED ORDER — METRONIDAZOLE 0.75 % VA GEL: 1.0000 | Freq: Two times a day (BID) | VAGINAL | Status: DC

## 2015-02-05 MED ORDER — CIPROFLOXACIN HCL 500 MG PO TABS: 500.0000 mg | ORAL_TABLET | Freq: Two times a day (BID) | ORAL | Status: DC

## 2015-02-05 NOTE — Progress Notes (Signed)
Patient discharged home with instructions given on medications,and follow up visits,patient verbalized understanding.Prescriptions sent with patient. No c/o pain or discomfort noted. Accompanied by staff to an awaiting vehicle.

## 2015-02-05 NOTE — Discharge Summary (Signed)
Physician Discharge Summary  Gabrielle [name] LXB:[id number] DOB: 09-25-1956 DOA: 02/02/2015  PCP: Lamar Blinks, MD  Admit date: 02/02/2015 Discharge date: 02/05/2015  Time spent: 35 minutes  Recommendations for Outpatient Follow-up:  1. Follow up with Dr. Lorelei Pont in 2-4 weeks 2. Follow up with Dr. Bridgett Larsson in 1 month 3. Continue Ciprofloxacin for 7 additional days    Discharge Diagnoses:  Principal Problem:   Sepsis due to urinary tract infection Active Problems:   Diabetes mellitus   High cholesterol   Hypertension   Stress incontinence in female   Essential hypertension   Bilateral iliac artery occlusion   Sepsis  Discharge Condition: stable  Diet recommendation: regular  Filed Weights   02/02/15 1819  Weight: 59.421 kg (131 lb)   History of present illness:  59 year old female with history of type 2 diabetes mellitus, hypertension, stress incontinence who presented to the ED with acute onset of severe suprapubic pain since this morning while she was at work. She also reports associated increased frequency of urination of almost 10 episodes with sensation of incomplete bladder emptying . The pain was excruciating 10/10 in severity, nonradiating and without any aggravating or relieving factors. She denies any hematuria or passing of kidney stones. Has history of UTI in the past but denies symptoms to be remotely similar to this. She denies any fever or chills, nausea or vomiting, chest pain, palpitations, diarrhea . Denies any sick contacts. Denies any change in her weight or appetite.   Hospital Course:  Sepsis secondary to pyelonephritis - patient was started on IV Ceftriaxone with improvement in her clinical condition, her leukocytosis improved and her abdominal pain as well. Urine cultures were sent and speciated E coli sensitive to Ciprofloxacin, she was discharged home in stable condition with 7 additional days of Cipro to complete a 10 day course.  Bilateral iliac  artery narrowing - outpatient follow-up with vascular surgeon Dr. Bridgett Larsson scheduled in a month HTN - outpatient follow up Diabetes mellitus type 2, insulin-dependent - blood sugars controlled. Urinary incontinence status post [facility] sling 02/2014 - outpatient follow-up with urology Prior syncope, negative stress Myoview in the past - outpatient follow-up Prior tobacco abuse  Procedures:  None    Consultations:  None   Discharge Exam: Filed Vitals:   02/04/15 2127 02/05/15 0535 02/05/15 0754 02/05/15 0953  BP: 117/56 124/57 136/61 120/60  Pulse: 70 88 107 86  Temp: 97.6 F (36.4 C) 99.5 F (37.5 C) 99.2 F (37.3 C) 99.9 F (37.7 C)  TempSrc: Oral Oral Oral Oral  Resp: 20 16 16 16   Height:      Weight:      SpO2: 94% 92% 95% 97%    General: NAD Cardiovascular: RRR Respiratory: CTA biL  Discharge Instructions     Medication List    TAKE these medications        amLODipine 10 MG tablet  Commonly known as:  NORVASC  Take 1 tablet (10 mg total) by mouth daily.     atorvastatin 10 MG tablet  Commonly known as:  LIPITOR  Take 1 tablet (10 mg total) by mouth every morning.     ciprofloxacin 500 MG tablet  Commonly known as:  CIPRO  Take 1 tablet (500 mg total) by mouth 2 (two) times daily.     clobetasol ointment 0.05 %  Commonly known as:  TEMOVATE  Apply 1 application topically as needed.     clobetasol cream 0.05 %  Commonly known as:  TEMOVATE  Apply 1  application topically as needed.     docusate sodium 100 MG capsule  Commonly known as:  COLACE  Take 1 capsule (100 mg total) by mouth 2 (two) times daily.     glucose blood test strip  Commonly known as:  ACCU-CHEK SMARTVIEW  PATIENT NEEDS OFFICE VISIT FOR ADDITIONAL REFILLS - very overdue for DM check up.     glucose blood test strip  Commonly known as:  ACCU-CHEK SMARTVIEW  USE ONE STRIP TO CHECK GLUCOSE THREE TIMES DAILY     HYDROcodone-acetaminophen 5-325 MG per tablet  Commonly known as:  NORCO   Take 1 tablet by mouth every 6 (six) hours as needed for moderate pain.     insulin glargine 100 UNIT/ML injection  Commonly known as:  LANTUS  15 units [place] nightly. Please dispense 5 pens.     lisinopril 10 MG tablet  Commonly known as:  PRINIVIL,ZESTRIL  Take 1 tablet (10 mg total) by mouth every morning.     metFORMIN 1000 MG tablet  Commonly known as:  GLUCOPHAGE  Take 1 tablet (1,000 mg total) by mouth 2 (two) times daily with a meal.     metroNIDAZOLE 0.75 % vaginal gel  Commonly known as:  METROGEL  Place 1 Applicatorful vaginally 2 (two) times daily. For 5 days     promethazine 50 MG tablet  Commonly known as:  PHENERGAN  Take 0.5 tablets (25 mg total) by mouth every 6 (six) hours as needed for nausea or vomiting.     triamcinolone cream 0.5 %  Commonly known as:  KENALOG  Apply 1 application topically as needed.           Follow-up Information    Follow up with Hinda Lenis, MD. Schedule an appointment as soon as possible for a visit on 03/06/2015.   Specialty:  Vascular Surgery   Why:  Please follow up with Dr. Bridgett Larsson on April 22nd at [facility] information:   [place] [address] [phone]       Follow up with Lamar Blinks, MD. Schedule an appointment as soon as possible for a visit on 02/16/2015.   Specialty:  Family Medicine   Why:  Please follow up with Dr. Lorelei Pont on April 4th at [facility] information:   [place] [id number] [phone]       The results of significant diagnostics from this hospitalization (including imaging, microbiology, ancillary and laboratory) are listed below for reference.    Significant Diagnostic Studies: Ct Abdomen Pelvis W Contrast  02/02/2015   CLINICAL DATA:  Abdominal and pelvic pain. Urinary frequency and dysuria.  EXAM: CT ABDOMEN AND PELVIS WITH CONTRAST  TECHNIQUE: Multidetector CT imaging of the abdomen and pelvis was performed using the standard protocol  following bolus administration of intravenous contrast.  CONTRAST:  71mL OMNIPAQUE IOHEXOL 300 MG/ML SOLN, 173mL OMNIPAQUE IOHEXOL 300 MG/ML SOLN  COMPARISON:  CT abdomen pelvis - 12/01/2024  FINDINGS: Normal hepatic contour. There is diffuse decreased attenuation of the hepatic parenchyma on this postcontrast examination. Subcentimeter hypo attenuating lesions within the dome of the right lobe of the liver (images 13 and 15, series 2) are too small to accurately characterize, though unchanged and again favored to represent hepatic cysts. Normal appearance of the gallbladder given degree distention. No radiopaque gallstones. No intra or extrahepatic biliary duct dilatation. No ascites.  There is symmetric enhancement and excretion of the bilateral kidneys. Bilateral subcentimeter hypo attenuating renal lesions are too small  to adequately characterize of favored to represent renal cysts. Scattered vascular calcifications about the bilateral renal hila. No definite renal stones this postcontrast examination. No urinary obstruction or perinephric stranding. Normal appearance of the bilateral adrenal glands, pancreas and spleen. Ingested enteric contrast extends to the level of the mid small bowel. Moderate colonic stool burden without evidence of enteric obstruction. The bowel is normal in course and caliber without discrete area of wall thickening. Normal appearance of the appendix. No pneumoperitoneum, pneumatosis or portal venous gas.  Large amount of eccentric mixed calcified and noncalcified atherosclerotic plaque throughout the abdominal aorta including the regular intraluminal mixed calcified and noncalcified atherosclerotic plaque within the mid aspect of the infrarenal abdominal aorta (representative images 34 through 36, series 2), not definitely resulting in a hemodynamically significant stenosis. There are suspected tandem areas of hemodynamically significant stenosis involving the bilateral common and  external iliac arteries. The right superficial femoral artery is occluded throughout its imaged course.  No retroperitoneal, mesenteric, pelvic or inguinal lymphadenopathy.  There is hyperemia involving in the inner surface of the urinary bladder, possibly accentuated due to phase of enhancement though cystitis could have a similar appearance. Post hysterectomy. No discrete adnexal lesion. No free fluid in the pelvic cul-de-sac.  Limited visualization of lower thorax demonstrates minimal dependent subpleural ground-glass atelectasis, right greater than left. There is minimal subsegmental atelectasis within the imaged caudal aspect the right middle lobe. No discrete focal airspace opacities. No pleural effusion.  Normal heart size.  Coronary artery calcifications.  No acute or aggressive osseous abnormalities.  There is a punctate (approximately 0.8 cm) calcification within the subcutaneous tissues about the right lateral abdominal wall (image 41, series 2), unchanged. Regional soft tissues appear normal.  IMPRESSION: 1. Apparent hyperemia involving the wall of the urinary bladder, potentially accentuated due to underdistention and phase of enhancement though cystitis could have a similar appearance. Coordination with urinalysis is recommended. No evidence of urinary obstruction. 2. Moderate to large amount of age advanced atherosclerotic plaque throughout the normal caliber abdominal aorta. 3. Suspected tandem areas of hemodynamically significant narrowings involving the bilateral common and external iliac arteries with occlusion of the right superficial femoral artery throughout its imaged course, similar to prior abdominal CT performed 11/2014. Correlation for symptoms of PAD is recommended. Further evaluation could be performed with dedicated nonemergent runoff CTA as clinically indicated. 4. Suspected hepatic steatosis. Correlation with LFTs is recommended.   Electronically Signed   By: Sandi Mariscal M.D.   On:  02/02/2015 14:30   Dg Chest Port 1 View  02/02/2015   CLINICAL DATA:  Abdominal pain an urinary frequency.  EXAM: PORTABLE CHEST - 1 VIEW  COMPARISON:  03/27/2008  FINDINGS: The heart size and mediastinal contours are within normal limits. Both lungs are clear. The visualized skeletal structures are unremarkable.  IMPRESSION: No acute cardiopulmonary findings.   Electronically Signed   By: Marijo Sanes M.D.   On: 02/02/2015 19:04    Microbiology: Recent Results (from the past 240 hour(s))  Wet prep, genital     Status: Abnormal   Collection Time: 02/02/15  1:08 PM  Result Value Ref Range Status   Yeast Wet Prep HPF POC NONE SEEN NONE SEEN Final   Trich, Wet Prep NONE SEEN NONE SEEN Final   Clue Cells Wet Prep HPF POC FEW (A) NONE SEEN Final   WBC, Wet Prep HPF POC FEW (A) NONE SEEN Final  Urine culture     Status: None   Collection Time: 02/02/15  1:50 PM  Result Value Ref Range Status   Specimen Description URINE, CATHETERIZED  Final   Special Requests NONE  Final   Colony Count   Final    >=100,000 COLONIES/ML Performed at [facility]    Culture   Final    ESCHERICHIA COLI Performed at [facility]    Report Status 02/05/2015 FINAL  Final   Organism ID, Bacteria ESCHERICHIA COLI  Final      Susceptibility   Escherichia coli - MIC*    AMPICILLIN >=32 RESISTANT Resistant     CEFAZOLIN <=4 SENSITIVE Sensitive     CEFTRIAXONE <=1 SENSITIVE Sensitive     CIPROFLOXACIN <=0.25 SENSITIVE Sensitive     GENTAMICIN <=1 SENSITIVE Sensitive     LEVOFLOXACIN <=0.12 SENSITIVE Sensitive     NITROFURANTOIN <=16 SENSITIVE Sensitive     TOBRAMYCIN <=1 SENSITIVE Sensitive     TRIMETH/SULFA <=20 SENSITIVE Sensitive     PIP/TAZO <=4 SENSITIVE Sensitive     * ESCHERICHIA COLI  Blood Culture (routine x 2)     Status: None (Preliminary result)   Collection Time: 02/02/15  6:19 PM  Result Value Ref Range Status   Specimen Description BLOOD R HAND  Final   Special Requests  BOTTLES DRAWN AEROBIC AND ANAEROBIC 4ML  Final   Culture   Final           BLOOD CULTURE RECEIVED NO GROWTH TO DATE CULTURE WILL BE HELD FOR 5 DAYS BEFORE ISSUING A FINAL NEGATIVE REPORT Performed at [facility]    Report Status PENDING  Incomplete  Blood Culture (routine x 2)     Status: None (Preliminary result)   Collection Time: 02/02/15  6:19 PM  Result Value Ref Range Status   Specimen Description BLOOD L HAND  Final   Special Requests BOTTLES DRAWN AEROBIC AND ANAEROBIC 4ML  Final   Culture   Final           BLOOD CULTURE RECEIVED NO GROWTH TO DATE CULTURE WILL BE HELD FOR 5 DAYS BEFORE ISSUING A FINAL NEGATIVE REPORT Performed at [facility]    Report Status PENDING  Incomplete     Labs: Basic Metabolic Panel:  Recent Labs Lab 02/02/15 1201 02/03/15 0527  NA 138 138  K 3.8 3.6  CL 102 106  CO2 20 23  GLUCOSE 169* 123*  BUN 10 8  CREATININE 0.83 0.67  CALCIUM 9.6 8.4   Liver Function Tests:  Recent Labs Lab 02/02/15 1201  AST 49*  ALT 28  ALKPHOS 123*  BILITOT 0.5  PROT 8.2  ALBUMIN 4.0    Recent Labs Lab 02/02/15 1201  LIPASE 26   CBC:  Recent Labs Lab 02/02/15 1201 02/03/15 0527 02/03/15 1805  WBC 19.4* 10.5 8.1  NEUTROABS 16.3* 7.3  --   HGB 11.6* 10.5* 10.3*  HCT 36.2 33.6* 33.2*  MCV 87.0 88.9 87.8  PLT 361 312 343   CBG:  Recent Labs Lab 02/04/15 0724 02/04/15 1132 02/04/15 1624 02/04/15 2130 02/05/15 0736  GLUCAP 118* 156* 97 182* 140*       Signed:  Nakeia Calvi  [facility] 02/05/2015, 11:59 AM

## 2015-02-06 LAB — HM DIABETES EYE EXAM

## 2015-02-09 LAB — CULTURE, BLOOD (ROUTINE X 2)
CULTURE: NO GROWTH
Culture: NO GROWTH

## 2015-02-16 ENCOUNTER — Encounter: Payer: Self-pay | Admitting: Family Medicine

## 2015-02-16 ENCOUNTER — Ambulatory Visit (INDEPENDENT_AMBULATORY_CARE_PROVIDER_SITE_OTHER): Payer: BC Managed Care – PPO | Admitting: Family Medicine

## 2015-02-16 VITALS — BP 105/68 | HR 91 | Temp 98.5°F | Resp 16 | Ht <= 58 in | Wt 132.6 lb

## 2015-02-16 DIAGNOSIS — IMO0001 Reserved for inherently not codable concepts without codable children: Secondary | ICD-10-CM

## 2015-02-16 DIAGNOSIS — N3000 Acute cystitis without hematuria: Secondary | ICD-10-CM

## 2015-02-16 DIAGNOSIS — Z794 Long term (current) use of insulin: Secondary | ICD-10-CM

## 2015-02-16 DIAGNOSIS — E119 Type 2 diabetes mellitus without complications: Secondary | ICD-10-CM | POA: Diagnosis not present

## 2015-02-16 DIAGNOSIS — Z87440 Personal history of urinary (tract) infections: Secondary | ICD-10-CM

## 2015-02-16 LAB — CBC
HCT: 37.4 % (ref 36.0–46.0)
Hemoglobin: 12.1 g/dL (ref 12.0–15.0)
MCH: 27.3 pg (ref 26.0–34.0)
MCHC: 32.4 g/dL (ref 30.0–36.0)
MCV: 84.4 fL (ref 78.0–100.0)
MPV: 10 fL (ref 8.6–12.4)
PLATELETS: 458 10*3/uL — AB (ref 150–400)
RBC: 4.43 MIL/uL (ref 3.87–5.11)
RDW: 14 % (ref 11.5–15.5)
WBC: 7.2 10*3/uL (ref 4.0–10.5)

## 2015-02-16 LAB — HEMOGLOBIN A1C
HEMOGLOBIN A1C: 7.6 % — AB (ref <5.7)
Mean Plasma Glucose: 171 mg/dL — ABNORMAL HIGH (ref <117)

## 2015-02-16 NOTE — Patient Instructions (Signed)
I will be in touch with your labs- I am so glad that you are feeling better!  Assuming that your A1c looks good we can plan to meet again in 4-6 months Please do reschedule your colonoscopy asap

## 2015-02-16 NOTE — Progress Notes (Addendum)
[facility] [address] [place] [address] [phone]  Date:  02/16/2015   Name:  Gabrielle [name]   DOB:  1956/02/19   MRN:  [id number]  PCP:  Lamar Blinks, MD    Chief Complaint: Follow-up and Urinary Tract Infection   History of Present Illness:  Gabrielle [name] is a 59 y.o. very pleasant female patient who presents with the following:  She was recently admitted from 3/21 to 3/24 with UTI sepsis.  She finished her cipro po. She is feeling overall better, but her appetite is not as good as usual. Her glucose is running 130- 140 in the evening.  She is not checking fasting BG No pain with urination, her back pain is resolved She is not eating as much as she usually does- she does not have much appetite.   She had a bladder sling a few years back by Dr. Jeffie Pollock- she will see him in December of this year, she follows up with him yearly.   Lab Results  Component Value Date   HGBA1C 7.7 11/05/2014   She had to cancer her colonoscopy which was schedueld when she was in the hospital- she plans to do this soon.  She is using 25 units of lantus.  She is now back on her metformin which had to be stopped while she was sign  Patient Active Problem List   Diagnosis Date Noted  . Sepsis due to urinary tract infection 02/02/2015  . Essential hypertension 02/02/2015  . Bilateral iliac artery occlusion 02/02/2015  . Sepsis 02/02/2015  . Stress incontinence in female 03/06/2014  . 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 Diabetes mellitus 02/28/2012  . High cholesterol 02/28/2012  . Hypertension 02/28/2012    Past Medical History  Diagnosis Date  . Lactose intolerance   . Hypertension   . Hyperlipidemia   . Type 2 diabetes mellitus   . RBBB   . SUI (stress urinary incontinence, female)   . Intrinsic (urethral) sphincter deficiency (ISD)   . Wears glasses   . Wears dentures     UPPER  . Eczema     Past Surgical History  Procedure Laterality  Date  . Abdominoplasty  1999  . Abdominal hysterectomy  1998    fibroids  . Excision lower abdominal scar post abdominoplasty  01-05-2000  . Orif right ankle fx  12-24-2008  . Right ankle arthroscopy w/ debridement and open removal hardware  03-12-2010  . Tubal ligation    . Cardiovascular stress test  03-27-2008    NORMAL PERFUSION STUDY/  EF 54%  . Transthoracic echocardiogram  08-05-2009    MILD LVH/  EF 60-65%  . Bladder suspension N/A 03/06/2014    Procedure: [facility] SLING ;  Surgeon: Irine Seal, MD;  Location: [facility];  Service: Urology;  Laterality: N/A;    History  Substance Use Topics  . Smoking status: Former Smoker -- 20 years    Types: Cigarettes    Quit date: 11/14/2006  . Smokeless tobacco: Never Used  . Alcohol Use: No    Family History  Problem Relation Age of Onset  . Diabetes Mother   . Diabetes Sister   . Diabetes Brother   . Lupus Daughter     No Known Allergies  Medication list has been reviewed and updated.  Current Outpatient Prescriptions on File Prior to Visit  Medication Sig Dispense Refill  . amLODipine (NORVASC) 10 MG tablet Take 1 tablet (10 mg total) by  mouth daily. 90 tablet 3  . atorvastatin (LIPITOR) 10 MG tablet Take 1 tablet (10 mg total) by mouth every morning. 90 tablet 3  . clobetasol cream (TEMOVATE) 3.79 % Apply 1 application topically as needed. 30 g 5  . clobetasol ointment (TEMOVATE) 0.24 % Apply 1 application topically as needed. 60 g 5  . glucose blood (ACCU-CHEK SMARTVIEW) test strip PATIENT NEEDS OFFICE VISIT FOR ADDITIONAL REFILLS - very overdue for DM check up. 100 each 0  . glucose blood (ACCU-CHEK SMARTVIEW) test strip USE ONE STRIP TO CHECK GLUCOSE THREE TIMES DAILY 100 each 3  . insulin glargine (LANTUS) 100 UNIT/ML injection 15 units [place] nightly. Please dispense 5 pens. 5 vial 3  . lisinopril (PRINIVIL,ZESTRIL) 10 MG tablet Take 1 tablet (10 mg total) by mouth every morning. 90 tablet 3  . metFORMIN  (GLUCOPHAGE) 1000 MG tablet Take 1 tablet (1,000 mg total) by mouth 2 (two) times daily with a meal. 180 tablet 3  . triamcinolone cream (KENALOG) 0.5 % Apply 1 application topically as needed.    . docusate sodium (COLACE) 100 MG capsule Take 1 capsule (100 mg total) by mouth 2 (two) times daily. (Patient not taking: Reported on 11/05/2014) 60 capsule 2  . HYDROcodone-acetaminophen (NORCO) 5-325 MG per tablet Take 1 tablet by mouth every 6 (six) hours as needed for moderate pain. (Patient not taking: Reported on 11/05/2014) 30 tablet 0  . metroNIDAZOLE (METROGEL) 0.75 % vaginal gel Place 1 Applicatorful vaginally 2 (two) times daily. For 5 days (Patient not taking: Reported on 02/16/2015) 70 g 0  . promethazine (PHENERGAN) 50 MG tablet Take 0.5 tablets (25 mg total) by mouth every 6 (six) hours as needed for nausea or vomiting. (Patient not taking: Reported on 11/05/2014) 20 tablet 0   No current facility-administered medications on file prior to visit.    Review of Systems:  As per HPI- otherwise negative.   Physical Examination: Filed Vitals:   02/16/15 1120  BP: 105/68  Pulse: 91  Temp: 98.5 F (36.9 C)  Resp: 16   Filed Vitals:   02/16/15 1120  Height: 4' 8.75" (1.441 m)  Weight: 132 lb 9.6 oz (60.147 kg)   Body mass index is 28.97 kg/(m^2). Ideal Body Weight: Weight in (lb) to have BMI = 25: 114.3  GEN: WDWN, NAD, Non-toxic, A & O x 3, looks well, overweight HEENT: Atraumatic, Normocephalic. Neck supple. No masses, No LAD. Ears and Nose: No external deformity. CV: RRR, No M/G/R. No JVD. No thrill. No extra heart sounds. PULM: CTA B, no wheezes, crackles, rhonchi. No retractions. No resp. distress. No accessory muscle use. ABD:  No CVA tenderness EXTR: No c/c/e NEURO Normal gait.  PSYCH: Normally interactive. Conversant. Not depressed or anxious appearing.  Calm demeanor.    Assessment and Plan: Type 2 diabetes mellitus without complication - Plan: HM Diabetes Foot  Exam  IDDM (insulin dependent diabetes mellitus) - Plan: CBC, Hemoglobin A1c  History of UTI - Plan: Urine culture  Recent UTI- will check urine culture to ensure all cleared  Check her A1c but she generally is well controlled Otherwise her DM is UTD Will plan further follow- up pending labs. Remind re mammo with labs   Signed Lamar Blinks, MD  Called 4/6 with prelim culture- positive for e coli again.  Will retreat with cipro for one week Await final  Meds ordered this encounter  Medications  . ciprofloxacin (CIPRO) 500 MG tablet    Sig: Take 1 tablet (500 mg  total) by mouth 2 (two) times daily.    Dispense:  14 tablet    Refill:  0   4/7:  Called her again- her bacteria is resistant to cipro.  Will DC cipro and start keflex 500 TID for one week.  She will let me know if not better in a day or two- Sooner if worse.

## 2015-02-18 ENCOUNTER — Other Ambulatory Visit: Payer: Self-pay

## 2015-02-18 MED ORDER — CIPROFLOXACIN HCL 500 MG PO TABS: 500.0000 mg | ORAL_TABLET | Freq: Two times a day (BID) | ORAL | Status: DC

## 2015-02-18 NOTE — Addendum Note (Signed)
Addended by: Lamar Blinks C on: 02/18/2015 03:28 PM   Modules accepted: Orders

## 2015-02-19 ENCOUNTER — Telehealth: Payer: Self-pay

## 2015-02-19 ENCOUNTER — Encounter: Payer: Self-pay | Admitting: Family Medicine

## 2015-02-19 LAB — URINE CULTURE: Colony Count: >=100000

## 2015-02-19 MED ORDER — CEPHALEXIN 500 MG PO CAPS
500.0000 mg | ORAL_CAPSULE | Freq: Three times a day (TID) | ORAL | Status: DC
Start: 2015-02-19 — End: 2015-02-24

## 2015-02-19 NOTE — Telephone Encounter (Signed)
Dr copland had already called pt and answered her questions.

## 2015-02-19 NOTE — Telephone Encounter (Signed)
Patient called and asked to speak to Dr. Lorelei Pont. Patient states that she was a prescribed medication and wants to know if she could get azo to take with it. Please call! [phone]

## 2015-02-19 NOTE — Addendum Note (Signed)
Addended by: Lamar Blinks C on: 02/19/2015 02:52 PM   Modules accepted: Orders, Medications

## 2015-02-20 ENCOUNTER — Encounter (HOSPITAL_COMMUNITY): Payer: Self-pay | Admitting: Emergency Medicine

## 2015-02-20 ENCOUNTER — Emergency Department (HOSPITAL_COMMUNITY): Payer: BC Managed Care – PPO

## 2015-02-20 ENCOUNTER — Emergency Department (HOSPITAL_COMMUNITY)
Admission: EM | Admit: 2015-02-20 | Discharge: 2015-02-20 | Disposition: A | Discharge status: Home / Self Care | Payer: BC Managed Care – PPO | Attending: Emergency Medicine | Admitting: Emergency Medicine

## 2015-02-20 DIAGNOSIS — Z87891 Personal history of nicotine dependence: Secondary | ICD-10-CM | POA: Insufficient documentation

## 2015-02-20 DIAGNOSIS — Z872 Personal history of diseases of the skin and subcutaneous tissue: Secondary | ICD-10-CM | POA: Insufficient documentation

## 2015-02-20 DIAGNOSIS — I10 Essential (primary) hypertension: Secondary | ICD-10-CM | POA: Diagnosis not present

## 2015-02-20 DIAGNOSIS — Z79899 Other long term (current) drug therapy: Secondary | ICD-10-CM | POA: Diagnosis not present

## 2015-02-20 DIAGNOSIS — Y92410 Unspecified street and highway as the place of occurrence of the external cause: Secondary | ICD-10-CM | POA: Insufficient documentation

## 2015-02-20 DIAGNOSIS — S29012A Strain of muscle and tendon of back wall of thorax, initial encounter: Secondary | ICD-10-CM | POA: Diagnosis not present

## 2015-02-20 DIAGNOSIS — T148XXA Other injury of unspecified body region, initial encounter: Secondary | ICD-10-CM

## 2015-02-20 DIAGNOSIS — Z8742 Personal history of other diseases of the female genital tract: Secondary | ICD-10-CM | POA: Insufficient documentation

## 2015-02-20 DIAGNOSIS — M546 Pain in thoracic spine: Secondary | ICD-10-CM

## 2015-02-20 DIAGNOSIS — Y9389 Activity, other specified: Secondary | ICD-10-CM | POA: Diagnosis not present

## 2015-02-20 DIAGNOSIS — Z794 Long term (current) use of insulin: Secondary | ICD-10-CM | POA: Diagnosis not present

## 2015-02-20 DIAGNOSIS — Y998 Other external cause status: Secondary | ICD-10-CM | POA: Insufficient documentation

## 2015-02-20 DIAGNOSIS — E119 Type 2 diabetes mellitus without complications: Secondary | ICD-10-CM | POA: Diagnosis not present

## 2015-02-20 DIAGNOSIS — S299XXA Unspecified injury of thorax, initial encounter: Secondary | ICD-10-CM | POA: Diagnosis present

## 2015-02-20 MED ORDER — METHOCARBAMOL 500 MG PO TABS: 500.0000 mg | ORAL_TABLET | Freq: Two times a day (BID) | ORAL | Status: DC

## 2015-02-20 MED ORDER — NAPROXEN 500 MG PO TABS
500.0000 mg | ORAL_TABLET | Freq: Once | ORAL | Status: AC
  Administered 2015-02-20: 500 mg via ORAL
  Filled 2015-02-20: qty 1

## 2015-02-20 MED ORDER — TRAMADOL HCL 50 MG PO TABS: 50.0000 mg | ORAL_TABLET | Freq: Four times a day (QID) | ORAL | Status: DC | PRN

## 2015-02-20 MED ORDER — METHOCARBAMOL 500 MG PO TABS
500.0000 mg | ORAL_TABLET | Freq: Once | ORAL | Status: AC
  Administered 2015-02-20: 500 mg via ORAL
  Filled 2015-02-20: qty 1

## 2015-02-20 MED ORDER — NAPROXEN 500 MG PO TABS: 500.0000 mg | ORAL_TABLET | Freq: Two times a day (BID) | ORAL | Status: DC
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
## 2015-02-24 ENCOUNTER — Encounter: Payer: Self-pay | Admitting: Family Medicine

## 2015-02-24 ENCOUNTER — Ambulatory Visit (INDEPENDENT_AMBULATORY_CARE_PROVIDER_SITE_OTHER): Payer: BC Managed Care – PPO

## 2015-02-24 ENCOUNTER — Ambulatory Visit (INDEPENDENT_AMBULATORY_CARE_PROVIDER_SITE_OTHER): Payer: BC Managed Care – PPO | Admitting: Family Medicine

## 2015-02-24 DIAGNOSIS — M542 Cervicalgia: Secondary | ICD-10-CM | POA: Diagnosis not present

## 2015-02-24 DIAGNOSIS — M545 Low back pain, unspecified: Secondary | ICD-10-CM

## 2015-02-24 MED ORDER — HYDROCODONE-ACETAMINOPHEN 5-325 MG PO TABS: 1.0000 | ORAL_TABLET | Freq: Three times a day (TID) | ORAL | Status: DC | PRN

## 2015-02-24 NOTE — Patient Instructions (Signed)
You have a neck strain Use the neck collar as needed for support You can use the hydrocodone as needed for more severe pain, but remember this will make you sleepy Do not take this at the same time as the tramadol, and I would avoid mixing it with methocarbamol unless absolutely necessary  Let us know if you do not feel better soon!
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
## 2015-03-05 ENCOUNTER — Encounter: Payer: Self-pay | Admitting: Vascular Surgery

## 2015-03-06 ENCOUNTER — Ambulatory Visit (INDEPENDENT_AMBULATORY_CARE_PROVIDER_SITE_OTHER): Payer: BC Managed Care – PPO | Admitting: Vascular Surgery

## 2015-03-06 ENCOUNTER — Encounter: Payer: Self-pay | Admitting: Vascular Surgery

## 2015-03-06 VITALS — BP 119/78 | HR 88 | Ht <= 58 in | Wt 133.9 lb

## 2015-03-06 DIAGNOSIS — I70219 Atherosclerosis of native arteries of extremities with intermittent claudication, unspecified extremity: Secondary | ICD-10-CM
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
## 2015-03-09 NOTE — Addendum Note (Signed)
Addended by: Mena Goes on: 03/09/2015 11:02 AM   Modules accepted: Orders

## 2015-03-26 ENCOUNTER — Other Ambulatory Visit: Payer: Self-pay | Admitting: Vascular Surgery

## 2015-03-26 LAB — BUN: BUN: 9 mg/dL (ref 6–23)

## 2015-03-26 LAB — CREATININE, SERUM: Creat: 0.7 mg/dL (ref 0.50–1.10)

## 2015-03-31 ENCOUNTER — Ambulatory Visit
Admission: RE | Admit: 2015-03-31 | Discharge: 2015-03-31 | Disposition: A | Discharge status: Home / Self Care | Payer: BC Managed Care – PPO | Source: Ambulatory Visit | Attending: Vascular Surgery | Admitting: Vascular Surgery

## 2015-03-31 DIAGNOSIS — I70219 Atherosclerosis of native arteries of extremities with intermittent claudication, unspecified extremity: Secondary | ICD-10-CM

## 2015-03-31 MED ORDER — IOPAMIDOL (ISOVUE-370) INJECTION 76%
165.0000 mL | Freq: Once | INTRAVENOUS | Status: AC | PRN
  Administered 2015-03-31: 165 mL via INTRAVENOUS

## 2015-04-08 ENCOUNTER — Encounter: Payer: Self-pay | Admitting: Vascular Surgery

## 2015-04-09 ENCOUNTER — Other Ambulatory Visit: Payer: Self-pay | Admitting: *Deleted

## 2015-04-09 DIAGNOSIS — I70219 Atherosclerosis of native arteries of extremities with intermittent claudication, unspecified extremity: Secondary | ICD-10-CM

## 2015-04-10 ENCOUNTER — Encounter: Payer: Self-pay | Admitting: Vascular Surgery

## 2015-04-10 ENCOUNTER — Ambulatory Visit (INDEPENDENT_AMBULATORY_CARE_PROVIDER_SITE_OTHER): Payer: BC Managed Care – PPO | Admitting: Vascular Surgery

## 2015-04-10 ENCOUNTER — Ambulatory Visit (HOSPITAL_COMMUNITY)
Admission: RE | Admit: 2015-04-10 | Discharge: 2015-04-10 | Disposition: A | Discharge status: Home / Self Care | Payer: BC Managed Care – PPO | Source: Ambulatory Visit | Attending: Vascular Surgery | Admitting: Vascular Surgery

## 2015-04-10 VITALS — BP 116/78 | HR 85 | Ht <= 58 in | Wt 138.7 lb

## 2015-04-10 DIAGNOSIS — I70219 Atherosclerosis of native arteries of extremities with intermittent claudication, unspecified extremity: Secondary | ICD-10-CM | POA: Diagnosis not present

## 2015-04-10 NOTE — Progress Notes (Addendum)
Established Intermittent Claudication  History of Present Illness  Gabrielle [name] is a 59 y.o. (April 18, 1956) female who presents with chief complaint: intermittent claudication.  The patient's symptoms have not progressed.  The patient's symptoms are: intermittent claudication with ~100 yard distance.  The patient's treatment regimen currently included: maximal medical management and stationary bike.  The patient returns today with CTA results and BLE ABI.  On her prior CT abd/pelvis there was concern for possible aortic dissection.  The patient's PMH, PSH, SH, FamHx, Med, and Allergies are unchanged from 03/06/15.  On ROS today: intermittent claudication, no rest pain  Physical Examination  Filed Vitals:   04/10/15 0858  BP: 116/78  Pulse: 85  Height: 4\' 10"  (1.473 m)  Weight: 138 lb 11.2 oz (62.914 kg)  SpO2: 96%   Body mass index is 29 kg/(m^2).  General: A&O x 3, WDWN  Pulmonary: Sym exp, good air movt, CTAB, no rales, rhonchi, & wheezing  Cardiac: RRR, Nl S1, S2, no Murmurs, rubs or gallops  Vascular: Vessel Right Left  Radial Palpable Palpable  Brachial Palpable Palpable  Carotid Palpable, without bruit Palpable, without bruit  Aorta Not palpable N/A  Femoral Palpable Palpable  Popliteal Not palpable Not palpable  PT Faintly Palpable Palpable  DP Not Palpable Faintly Palpable   Gastrointestinal: soft, NTND, -G/R, - HSM, - masses, - CVAT B  Musculoskeletal: M/S 5/5 throughout , Extremities without ischemic changes   Neurologic: Pain and light touch intact in extremities , Motor exam as listed above  Non-Invasive Vascular Imaging ABI (Date: 04/10/2015)  R: 0.83, DP: mono, PT: biphasic, TBI: nd  L: 0.94, DP: bi, PT: bi, TBI: nd  CTA Abd/pelvis (03/31/15) There is irregular plaque in the infrarenal aorta causing estimated 60% true luminal narrowing. Pressure gradient measurements are recommended for correlation.  There is 50% narrowing at the origin of  the right common iliac artery.  There is occlusion of the superficial femoral artery from its origin. The right popliteal artery reconstitutes.  Left superficial femoral artery is diffusely diseased but without significant focal stenosis. Three vessel runoff to the left ankle.  Based on my review of this CTA, patient has diffuse infrarenal aortic atherosclerosis.  There is segment with irregular >50% stenosis, but this may also be artifact given its short segment appearance.  There does not appear to be frank thrombus present.  The right iliac system is diseased but patent with likely >50% stenosis in mid external iliac artery segment.  I don't see a R SFA occlusion rather possibly a L SFA occlusion.  Both SFA appear patent but diseased (L>R).  The previous aortic dissection is not present in this image c/w with my suspicion that it was computer reconstruction error.   Medical Decision Making  Gabrielle [name] is a 59 y.o. female who presents with:  bilateral leg intermittent claudication without evidence of critical limb ischemia.   At this point, maximal medical mgmt is the most I would offer.  The patient does not have lifestyle limiting claudication which affect her ADL.   The magnitude of the waveforms suggest she well compensated for her disease burden.   The emphasis should be on risk factor modification for now.  We discussed what to look for in case of development of CLI.  Based on the patient's vascular studies and examination, I have offered the patient: q6 ABI.  I discussed in depth with the patient the nature of atherosclerosis, and emphasized the importance of maximal medical management  including strict control of blood pressure, blood glucose, and lipid levels, antiplatelet agents, obtaining regular exercise, and cessation of smoking.    The patient is aware that without maximal medical management the underlying atherosclerotic disease process will progress, limiting the  benefit of any interventions. The patient is currently on a statin: Lipitor. The patient is currently on an anti-platelet: ASA.  Thank you for allowing [place] to participate in this patient's care.  Adele Barthel, MD [facility] [place] Office: [phone] Pager: [phone]  04/10/2015, 9:26 AM

## 2015-04-21 ENCOUNTER — Encounter: Payer: Self-pay | Admitting: *Deleted

## 2015-06-05 ENCOUNTER — Telehealth: Payer: Self-pay

## 2015-06-05 NOTE — Telephone Encounter (Signed)
Gabrielle [name] called in to Disabilities voicemail on Wednesday 06/03/15 at 10:14am inquiring about medical records for this patient, he stated that he had sent payment in on 05/15/15 and still had not received any faxes from [place], I checked the release for this patient and seen that it has already been processed so I reopened it and sent them again, I will call Gabrielle [name] back and see if he received these files.  His call back number is [phone].

## 2015-08-18 ENCOUNTER — Telehealth: Payer: Self-pay | Admitting: Family Medicine

## 2015-08-18 DIAGNOSIS — Z1231 Encounter for screening mammogram for malignant neoplasm of breast: Secondary | ICD-10-CM

## 2015-08-18 NOTE — Telephone Encounter (Signed)
Spoke with patient about mammogram and she is going to call [facility] and schedule the mammogram and then have them fax [place] a copy of the report.

## 2015-11-11 ENCOUNTER — Encounter: Payer: Self-pay | Admitting: Physician Assistant

## 2015-11-11 ENCOUNTER — Ambulatory Visit (INDEPENDENT_AMBULATORY_CARE_PROVIDER_SITE_OTHER): Payer: BC Managed Care – PPO | Admitting: Physician Assistant

## 2015-11-11 VITALS — BP 118/66 | HR 88 | Temp 98.8°F | Resp 16 | Ht <= 58 in | Wt 133.4 lb

## 2015-11-11 DIAGNOSIS — Z0000 Encounter for general adult medical examination without abnormal findings: Secondary | ICD-10-CM

## 2015-11-11 DIAGNOSIS — E785 Hyperlipidemia, unspecified: Secondary | ICD-10-CM

## 2015-11-11 DIAGNOSIS — IMO0001 Reserved for inherently not codable concepts without codable children: Secondary | ICD-10-CM

## 2015-11-11 DIAGNOSIS — Z1239 Encounter for other screening for malignant neoplasm of breast: Secondary | ICD-10-CM

## 2015-11-11 DIAGNOSIS — I10 Essential (primary) hypertension: Secondary | ICD-10-CM | POA: Diagnosis not present

## 2015-11-11 DIAGNOSIS — Z1329 Encounter for screening for other suspected endocrine disorder: Secondary | ICD-10-CM | POA: Diagnosis not present

## 2015-11-11 DIAGNOSIS — Z130 Encounter for screening for diseases of the blood and blood-forming organs and certain disorders involving the immune mechanism: Secondary | ICD-10-CM | POA: Diagnosis not present

## 2015-11-11 DIAGNOSIS — Z794 Long term (current) use of insulin: Secondary | ICD-10-CM

## 2015-11-11 DIAGNOSIS — L309 Dermatitis, unspecified: Secondary | ICD-10-CM | POA: Diagnosis not present

## 2015-11-11 DIAGNOSIS — E119 Type 2 diabetes mellitus without complications: Secondary | ICD-10-CM

## 2015-11-11 LAB — POCT GLYCOSYLATED HEMOGLOBIN (HGB A1C): HEMOGLOBIN A1C: 7.8

## 2015-11-11 MED ORDER — GLUCOSE BLOOD VI STRP: ORAL_STRIP | Status: DC

## 2015-11-11 MED ORDER — INSULIN DEGLUDEC 100 UNIT/ML ~~LOC~~ SOPN: 15.0000 [IU] | PEN_INJECTOR | Freq: Every day | SUBCUTANEOUS | Status: DC

## 2015-11-11 MED ORDER — AMLODIPINE BESYLATE 10 MG PO TABS: 10.0000 mg | ORAL_TABLET | Freq: Every day | ORAL | Status: DC

## 2015-11-11 MED ORDER — CLOBETASOL PROPIONATE 0.05 % EX OINT: 1.0000 "application " | TOPICAL_OINTMENT | CUTANEOUS | Status: DC | PRN

## 2015-11-11 MED ORDER — ATORVASTATIN CALCIUM 10 MG PO TABS: 10.0000 mg | ORAL_TABLET | Freq: Every morning | ORAL | Status: DC

## 2015-11-11 MED ORDER — LISINOPRIL 10 MG PO TABS: 10.0000 mg | ORAL_TABLET | Freq: Every morning | ORAL | Status: DC

## 2015-11-11 MED ORDER — METFORMIN HCL 1000 MG PO TABS: 1000.0000 mg | ORAL_TABLET | Freq: Two times a day (BID) | ORAL | Status: DC

## 2015-11-11 NOTE — Progress Notes (Signed)
[facility] [address], [place] [address] [phone]  Date:  11/11/2015   Name:  Gabrielle [name]   DOB:  1956-06-15   MRN:  [id number]  PCP:  Lamar Blinks, MD    History of Present Illness:  Gabrielle [name] is a 59 y.o. female patient who presents to [facility] for annual physical exam, refill of medications, and to fill out form.   Diet: small portions.  Attempts to avoid desserts.  Drinks ice tea--sweet.  Water intake is only with thirst.  BM: she has bowel movements daily.  No blood in stool.  No diarrhea, constipation.  Urination: Normal, bladder sling this year which helps with incontinence.  No hematuria.  No frequency.  She has not followed by urologist last year.    Sleep: sleep is fine.  Lantus makes her sleepy.    Social activity: she does not do much. Married--goes to park.   She exercises "every now and then" EtOH: none Tobacco use: quit 2008, high school 1 pk per day Illicit drug use: None  Opthalmologist 2 years ago.  Patient reports she is taking the Lantus 15 units at night.  Insurance is not covering and advised to switch to treshiba.  She states glucose is between 80-130 at night.  She is not taking morning glucose.  She has no tremulousness, nausea, diarrhea, polydipsia, or polyuria.    Patient Active Problem List   Diagnosis Date Noted  . Sepsis due to urinary tract infection ([place]) 02/02/2015  . Essential hypertension 02/02/2015  . Atherosclerosis of native arteries of extremity with intermittent claudication ([place]) 02/02/2015  . Sepsis ([place]) 02/02/2015  . Stress incontinence in female 03/06/2014  . 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 Diabetes mellitus ([place]) 02/28/2012  . High cholesterol 02/28/2012  . Hypertension 02/28/2012    Past Medical History  Diagnosis Date  . Lactose intolerance   . Hypertension   . Hyperlipidemia   . Type 2 diabetes mellitus ([place])   . RBBB   . SUI (stress urinary incontinence,  female)   . Intrinsic (urethral) sphincter deficiency (ISD)   . Wears glasses   . Wears dentures     UPPER  . Eczema   . Cataract     Past Surgical History  Procedure Laterality Date  . Abdominoplasty  1999  . Abdominal hysterectomy  1998    fibroids  . Excision lower abdominal scar post abdominoplasty  01-05-2000  . Orif right ankle fx  12-24-2008  . Right ankle arthroscopy w/ debridement and open removal hardware  03-12-2010  . Tubal ligation    . Cardiovascular stress test  03-27-2008    NORMAL PERFUSION STUDY/  EF 54%  . Transthoracic echocardiogram  08-05-2009    MILD LVH/  EF 60-65%  . Bladder suspension N/A 03/06/2014    Procedure: [facility] SLING ;  Surgeon: Irine Seal, MD;  Location: [facility];  Service: Urology;  Laterality: N/A;  . Cosmetic surgery      Social History  Substance Use Topics  . Smoking status: Former Smoker -- 20 years    Types: Cigarettes    Quit date: 11/14/2006  . Smokeless tobacco: Never Used  . Alcohol Use: No    Family History  Problem Relation Age of Onset  . Diabetes Mother   . Hypertension Mother   . Diabetes Sister   . Hypertension Sister   . Diabetes Brother   . Hypertension Brother   . Lupus Daughter  No Known Allergies  Medication list has been reviewed and updated.  Current Outpatient Prescriptions on File Prior to Visit  Medication Sig Dispense Refill  . amLODipine (NORVASC) 10 MG tablet Take 1 tablet (10 mg total) by mouth daily. 90 tablet 3  . aspirin 81 MG tablet Take 81 mg by mouth daily.    Marland Kitchen atorvastatin (LIPITOR) 10 MG tablet Take 1 tablet (10 mg total) by mouth every morning. 90 tablet 3  . betamethasone dipropionate (DIPROLENE) 0.05 % cream Apply topically 2 (two) times daily.    . clobetasol cream (TEMOVATE) [id number] % Apply 1 application topically as needed. 30 g 5  . insulin glargine (LANTUS) 100 UNIT/ML injection 15 units [facility] nightly. Please dispense 5 pens. 5 vial 3  . lisinopril  (PRINIVIL,ZESTRIL) 10 MG tablet Take 1 tablet (10 mg total) by mouth every morning. 90 tablet 3  . metFORMIN (GLUCOPHAGE) 1000 MG tablet Take 1 tablet (1,000 mg total) by mouth 2 (two) times daily with a meal. (Patient taking differently: Take 1,000 mg by mouth 3 (three) times daily. ) 180 tablet 3  . clobetasol ointment (TEMOVATE) [id number] % Apply 1 application topically as needed. (Patient not taking: Reported on 03/06/2015) 60 g 5  . glucose blood (ACCU-CHEK SMARTVIEW) test strip PATIENT NEEDS OFFICE VISIT FOR ADDITIONAL REFILLS - very overdue for DM check up. (Patient not taking: Reported on 03/06/2015) 100 each 0  . HYDROcodone-acetaminophen (NORCO/VICODIN) 5-325 MG per tablet Take 1 tablet by mouth every 8 (eight) hours as needed. (Patient not taking: Reported on 03/06/2015) 10 tablet 0  . methocarbamol (ROBAXIN) 500 MG tablet Take 1 tablet (500 mg total) by mouth 2 (two) times daily. (Patient not taking: Reported on 02/24/2015) 20 tablet 0  . naproxen (NAPROSYN) 500 MG tablet Take 1 tablet (500 mg total) by mouth 2 (two) times daily. (Patient not taking: Reported on 02/24/2015) 30 tablet 0  . traMADol (ULTRAM) 50 MG tablet Take 1 tablet (50 mg total) by mouth every 6 (six) hours as needed for severe pain. (Patient not taking: Reported on 03/06/2015) 15 tablet 0   No current facility-administered medications on file prior to visit.    Review of Systems  Constitutional: Negative for fever and chills.  HENT: Negative for ear discharge, ear pain and sore throat.   Eyes: Negative for blurred vision and double vision.  Respiratory: Negative for cough, shortness of breath and wheezing.   Cardiovascular: Negative for chest pain, palpitations and leg swelling.  Gastrointestinal: Negative for nausea, vomiting and diarrhea.  Genitourinary: Negative for dysuria, frequency and hematuria.  Skin: Negative for itching and rash.  Neurological: Negative for dizziness and headaches.     Physical Examination: BP  118/66 mmHg  Pulse 88  Temp(Src) 98.8 F (37.1 C) (Oral)  Resp 16  Ht 4' 9.5" (1.461 m)  Wt 133 lb 6.4 oz (60.51 kg)  BMI 28.35 kg/m2  SpO2 98% Ideal Body Weight: Weight in (lb) to have BMI = 25: 117.3  Physical Exam  Constitutional: She is oriented to person, place, and time. She appears well-developed and well-nourished. No distress.  HENT:  Head: Normocephalic and atraumatic.  Right Ear: Tympanic membrane, external ear and ear canal normal.  Left Ear: Tympanic membrane, external ear and ear canal normal.  Nose: Right sinus exhibits no maxillary sinus tenderness and no frontal sinus tenderness. Left sinus exhibits no maxillary sinus tenderness and no frontal sinus tenderness.  Mouth/Throat: Oropharynx is clear and moist. No uvula swelling. No oropharyngeal exudate, posterior oropharyngeal  edema or posterior oropharyngeal erythema.  Eyes: Conjunctivae and EOM are normal. Pupils are equal, round, and reactive to light.  Neck: Normal range of motion. Neck supple. No thyromegaly present.  Cardiovascular: Normal rate, regular rhythm, normal heart sounds and intact distal pulses.  Exam reveals no gallop, no distant heart sounds and no friction rub.   No murmur heard. Pulses:      Radial pulses are 2+ on the right side, and 2+ on the left side.       Dorsalis pedis pulses are 1+ on the right side, and 1+ on the left side.  Pulmonary/Chest: Effort normal and breath sounds normal. No respiratory distress. She has no decreased breath sounds. She has no wheezes. She has no rhonchi.  Abdominal: Soft. Bowel sounds are normal. She exhibits no distension and no mass. There is no tenderness.  Genitourinary: Vagina normal. Pelvic exam was performed with patient supine. There is no rash on the right labia. There is no rash on the left labia.  Musculoskeletal: Normal range of motion. She exhibits no edema or tenderness.  Feet:  Right Foot:  Protective Sensation: 5 sites tested.5 sites sensed. Skin  Integrity: Negative for blister, skin breakdown or warmth.  Left Foot:  Protective Sensation: 5 sites tested. 5 sites sensed. Skin Integrity: Negative for blister, skin breakdown or warmth.  Lymphadenopathy:       Head (right side): No submandibular, no tonsillar, no preauricular and no posterior auricular adenopathy present.       Head (left side): No submandibular, no tonsillar, no preauricular and no posterior auricular adenopathy present.    She has no cervical adenopathy.  Neurological: She is alert and oriented to person, place, and time. No cranial nerve deficit. She exhibits normal muscle tone. Coordination normal.  Skin: Skin is warm and dry. She is not diaphoretic.  Rust color palms and on dorsum of foot.  Psychiatric: She has a normal mood and affect. Her behavior is normal.     Assessment and Plan: Gabrielle [name] is a 59 y.o. female who is here today for annual physical exam, medication refill, and handicap placard. -declines tdap today -mammogram ordered today -patient declines colonoscopy referral today, states that she will schedule herself. -patient will follow up with Opthalmologist. Refill of medication given today.  Advised to discontinue use of steroid cream and only use for 2 weeks.  General moisturizing advised.  -advised better nutrition.  She does not wish to follow up with endocrinologist at this time.   Given food instruction on handout and advised to hydrate more with water.  Discontinue sweet tea beverage. IDDM (insulin dependent diabetes mellitus) ([place]) - Plan: POCT glycosylated hemoglobin (Hb A1C), metFORMIN (GLUCOPHAGE) 1000 MG tablet, glucose blood (ACCU-CHEK SMARTVIEW) test strip, Insulin Degludec (TRESIBA FLEXTOUCH) 100 UNIT/ML SOPN, COMPLETE METABOLIC PANEL WITH GFR  Essential hypertension - Plan: amLODipine (NORVASC) 10 MG tablet, lisinopril (PRINIVIL,ZESTRIL) 10 MG tablet  Acute eczema of hand - Plan: clobetasol ointment (TEMOVATE) 0.05  %  Hyperlipemia - Plan: atorvastatin (LIPITOR) 10 MG tablet, Lipid panel  Annual physical exam - Plan: COMPLETE METABOLIC PANEL WITH GFR, Lipid panel, TSH, CBC  Screening for thyroid disorder - Plan: TSH  Screening for deficiency anemia - Plan: CBC    Ivar Drape, PA-C [facility] 11/11/2015 2:46 PM

## 2015-11-11 NOTE — Patient Instructions (Signed)
Please schedule visit with ophthalmologist and colonoscopy. I will have your results within the next 10 days.   Diabetes Mellitus and Food It is important for you to manage your blood sugar (glucose) level. Your blood glucose level can be greatly affected by what you eat. Eating healthier foods in the appropriate amounts throughout the day at about the same time each day will help you control your blood glucose level. It can also help slow or prevent worsening of your diabetes mellitus. Healthy eating may even help you improve the level of your blood pressure and reach or maintain a healthy weight.  General recommendations for healthful eating and cooking habits include:  Eating meals and snacks regularly. Avoid going long periods of time without eating to lose weight.  Eating a diet that consists mainly of plant-based foods, such as fruits, vegetables, nuts, legumes, and whole grains.  Using low-heat cooking methods, such as baking, instead of high-heat cooking methods, such as deep frying. Work with your dietitian to make sure you understand how to use the Nutrition Facts information on food labels. HOW CAN FOOD AFFECT ME? Carbohydrates Carbohydrates affect your blood glucose level more than any other type of food. Your dietitian will help you determine how many carbohydrates to eat at each meal and teach you how to count carbohydrates. Counting carbohydrates is important to keep your blood glucose at a healthy level, especially if you are using insulin or taking certain medicines for diabetes mellitus. Alcohol Alcohol can cause sudden decreases in blood glucose (hypoglycemia), especially if you use insulin or take certain medicines for diabetes mellitus. Hypoglycemia can be a life-threatening condition. Symptoms of hypoglycemia (sleepiness, dizziness, and disorientation) are similar to symptoms of having too much alcohol.  If your health care provider has given you approval to drink alcohol, do  so in moderation and use the following guidelines:  Women should not have more than one drink per day, and men should not have more than two drinks per day. One drink is equal to:  12 oz of beer.  5 oz of wine.  1 oz of hard liquor.  Do not drink on an empty stomach.  Keep yourself hydrated. Have water, diet soda, or unsweetened iced tea.  Regular soda, juice, and other mixers might contain a lot of carbohydrates and should be counted. WHAT FOODS ARE NOT RECOMMENDED? As you make food choices, it is important to remember that all foods are not the same. Some foods have fewer nutrients per serving than other foods, even though they might have the same number of calories or carbohydrates. It is difficult to get your body what it needs when you eat foods with fewer nutrients. Examples of foods that you should avoid that are high in calories and carbohydrates but low in nutrients include:  Trans fats (most processed foods list trans fats on the Nutrition Facts label).  Regular soda.  Juice.  Candy.  Sweets, such as cake, pie, doughnuts, and cookies.  Fried foods. WHAT FOODS CAN I EAT? Eat nutrient-rich foods, which will nourish your body and keep you healthy. The food you should eat also will depend on several factors, including:  The calories you need.  The medicines you take.  Your weight.  Your blood glucose level.  Your blood pressure level.  Your cholesterol level. You should eat a variety of foods, including:  Protein.  Lean cuts of meat.  Proteins low in saturated fats, such as fish, egg whites, and beans. Avoid processed meats.  Fruits and vegetables.  Fruits and vegetables that may help control blood glucose levels, such as apples, mangoes, and yams.  Dairy products.  Choose fat-free or low-fat dairy products, such as milk, yogurt, and cheese.  Grains, bread, pasta, and rice.  Choose whole grain products, such as multigrain bread, whole oats, and  brown rice. These foods may help control blood pressure.  Fats.  Foods containing healthful fats, such as nuts, avocado, olive oil, canola oil, and fish. DOES EVERYONE WITH DIABETES MELLITUS HAVE THE SAME MEAL PLAN? Because every person with diabetes mellitus is different, there is not one meal plan that works for everyone. It is very important that you meet with a dietitian who will help you create a meal plan that is just right for you.   This information is not intended to replace advice given to you by your health care provider. Make sure you discuss any questions you have with your health care provider.   Document Released: 07/28/2005 Document Revised: 11/21/2014 Document Reviewed: 09/27/2013 Elsevier Interactive Patient Education [employer].

## 2015-11-12 LAB — CBC
HCT: 39.4 % (ref 36.0–46.0)
Hemoglobin: 13 g/dL (ref 12.0–15.0)
MCH: 28.6 pg (ref 26.0–34.0)
MCHC: 33 g/dL (ref 30.0–36.0)
MCV: 86.8 fL (ref 78.0–100.0)
MPV: 10.1 fL (ref 8.6–12.4)
PLATELETS: 316 10*3/uL (ref 150–400)
RBC: 4.54 MIL/uL (ref 3.87–5.11)
RDW: 14.2 % (ref 11.5–15.5)
WBC: 6.3 10*3/uL (ref 4.0–10.5)

## 2015-11-12 LAB — COMPLETE METABOLIC PANEL WITH GFR
ALT: 18 U/L (ref 6–29)
AST: 24 U/L (ref 10–35)
Albumin: 4.3 g/dL (ref 3.6–5.1)
Alkaline Phosphatase: 103 U/L (ref 33–130)
BUN: 9 mg/dL (ref 7–25)
CHLORIDE: 101 mmol/L (ref 98–110)
CO2: 25 mmol/L (ref 20–31)
Calcium: 9.9 mg/dL (ref 8.6–10.4)
Creat: 0.74 mg/dL (ref 0.50–1.05)
Glucose, Bld: 117 mg/dL — ABNORMAL HIGH (ref 65–99)
Potassium: 4.2 mmol/L (ref 3.5–5.3)
SODIUM: 137 mmol/L (ref 135–146)
TOTAL PROTEIN: 7.9 g/dL (ref 6.1–8.1)
Total Bilirubin: 0.4 mg/dL (ref 0.2–1.2)

## 2015-11-12 LAB — LIPID PANEL
CHOL/HDL RATIO: 5.3 ratio — AB (ref <=5.0)
Cholesterol: 195 mg/dL (ref 125–200)
HDL: 37 mg/dL — AB (ref >=46)
LDL CALC: 133 mg/dL — AB (ref <130)
TRIGLYCERIDES: 125 mg/dL (ref <150)
VLDL: 25 mg/dL (ref <30)

## 2015-11-12 LAB — TSH: TSH: 1.121 u[IU]/mL (ref 0.350–4.500)

## 2015-11-13 ENCOUNTER — Encounter (HOSPITAL_COMMUNITY): Payer: BC Managed Care – PPO

## 2015-11-13 ENCOUNTER — Ambulatory Visit: Payer: BC Managed Care – PPO | Admitting: Family

## 2015-12-04 ENCOUNTER — Encounter: Payer: Self-pay | Admitting: Family Medicine

## 2015-12-09 ENCOUNTER — Encounter: Payer: Self-pay | Admitting: Family Medicine

## 2016-05-16 IMAGING — DX DG CHEST 1V PORT
1 series · 1 of 1 positions shown · non-contrast
Comparison: 03/27/2008

CLINICAL DATA: Abdominal pain an urinary frequency.

EXAM:
PORTABLE CHEST - 1 VIEW

[chest ap]
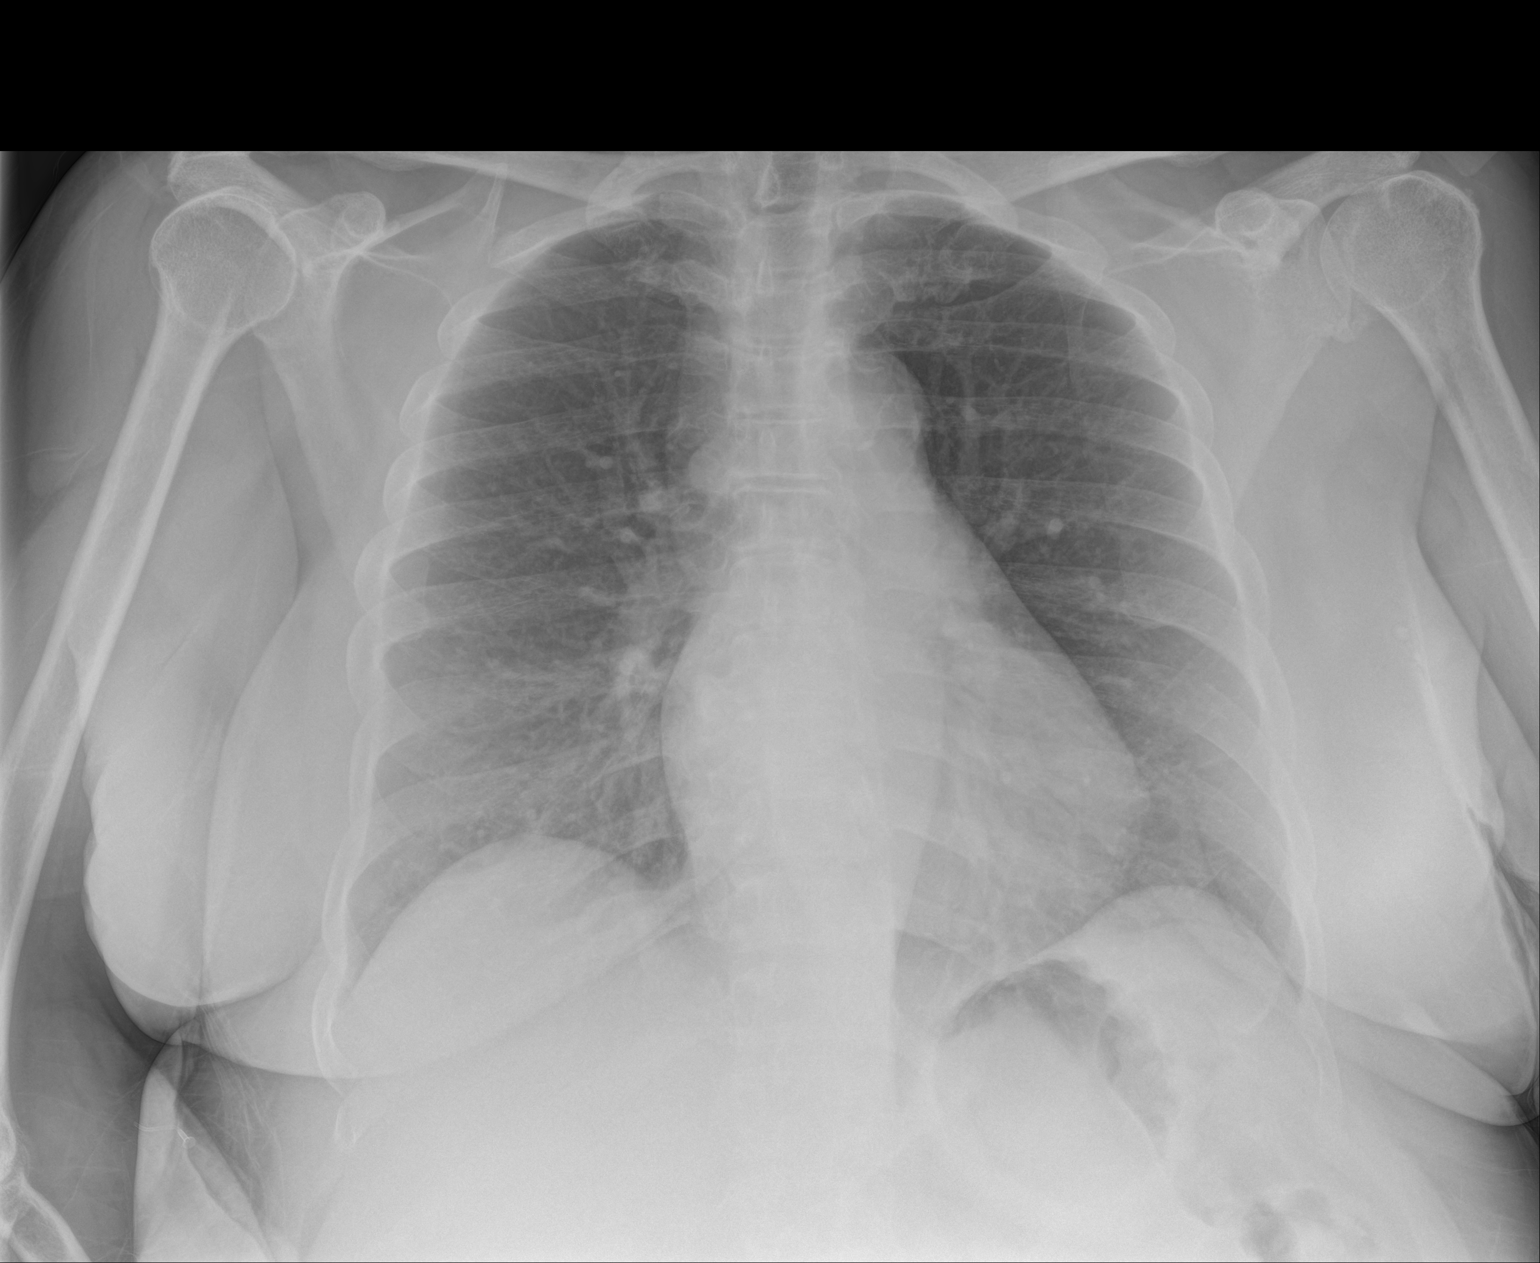

[1 of 1 positions shown; findings below may reference images not displayed]

FINDINGS: The heart size and mediastinal contours are within normal limits.
Both lungs are clear. The visualized skeletal structures are
unremarkable.
IMPRESSION: No acute cardiopulmonary findings.

## 2016-07-12 IMAGING — CT CT ANGIO AOBIFEM WO/W CM
1 of 9 series · 10 of 33 positions shown · IV contrast ([ID] [id number] [id number])
Comparison: 02/02/2015

CLINICAL DATA: Claudication

EXAM:
CT ANGIOGRAPHY OF ABDOMINAL AORTA WITH ILIOFEMORAL RUNOFF
TECHNIQUE: Multidetector CT imaging of the abdomen, pelvis and lower
extremities was performed using the standard protocol during bolus
administration of intravenous contrast. Multiplanar CT image
reconstructions and MIPs were obtained to evaluate the vascular
anatomy.
CONTRAST:  165 cc Isovue 370

[Series 5: runoff · axial · 0.76mm/px · z∈[-1018,-75]mm · 10 of 422 slices shown]
[im 39/422  soft-tissue]
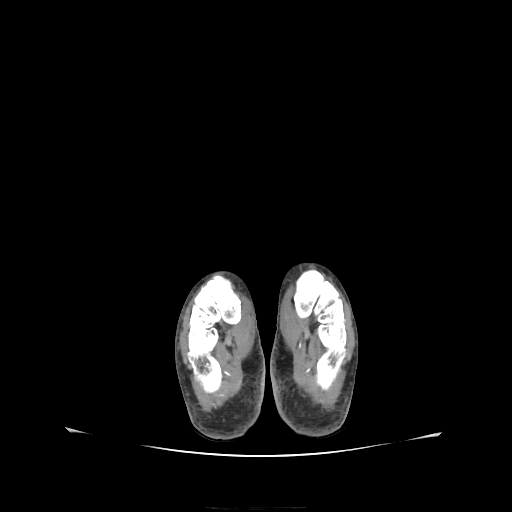
[im 77/422  bone]
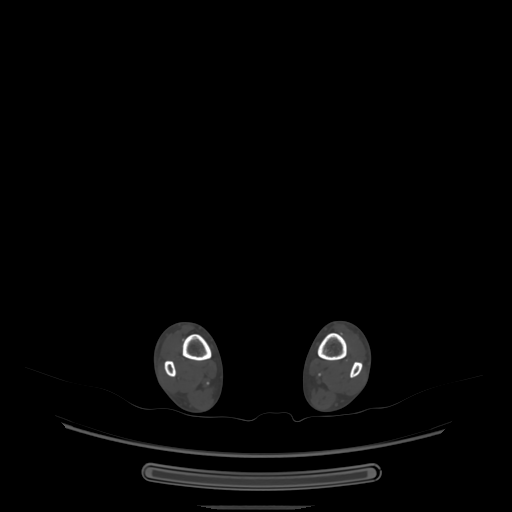
[im 115/422  soft-tissue]
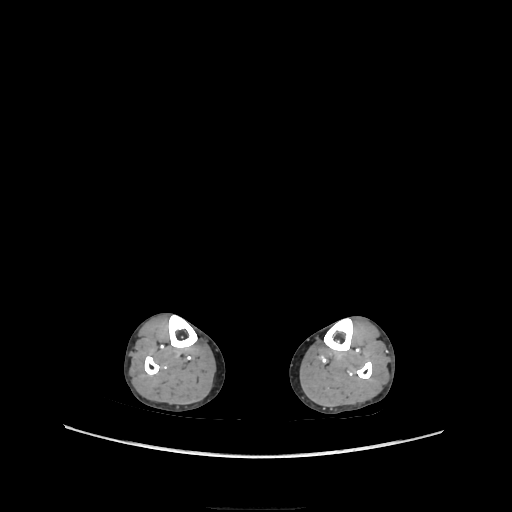
[im 154/422  bone]
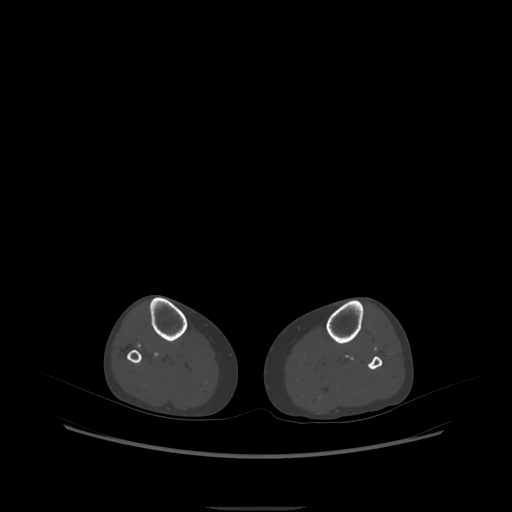
[im 192/422  soft-tissue]
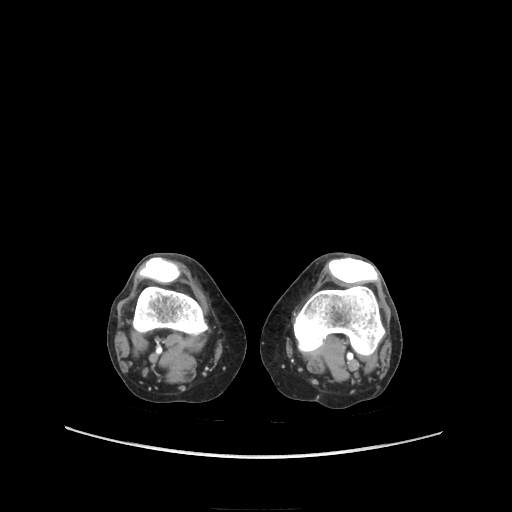
[im 230/422  bone]
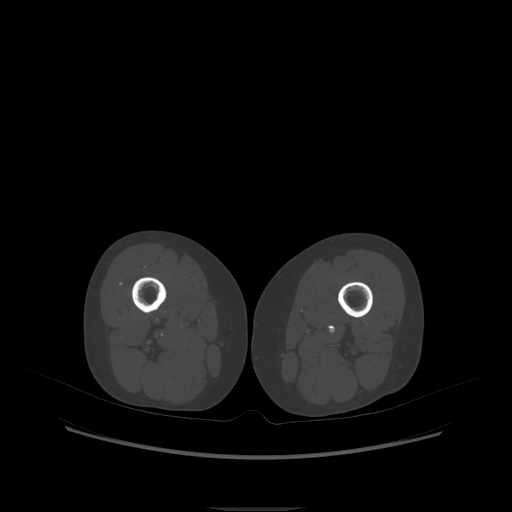
[im 268/422  soft-tissue]
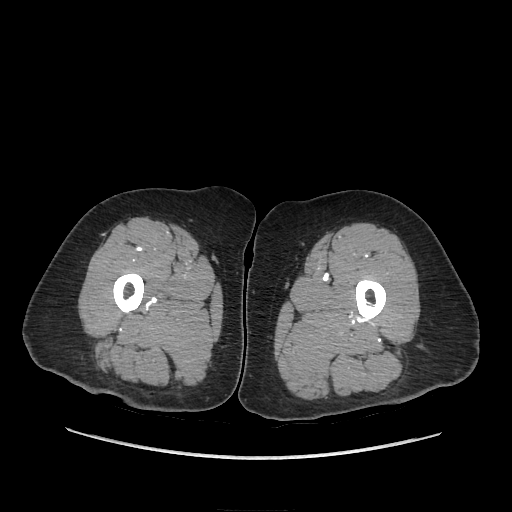
[im 307/422  bone]
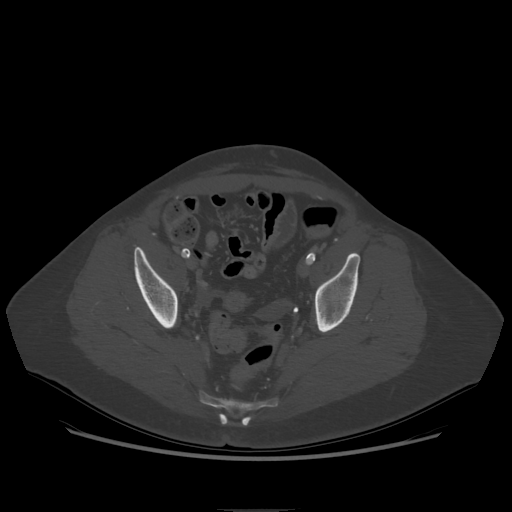
[im 345/422  soft-tissue]
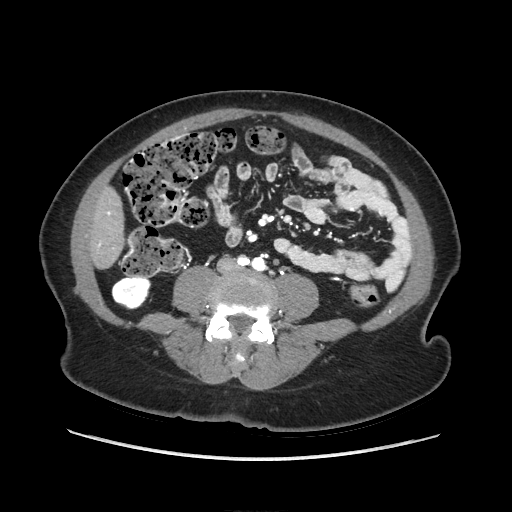
[im 383/422  bone]
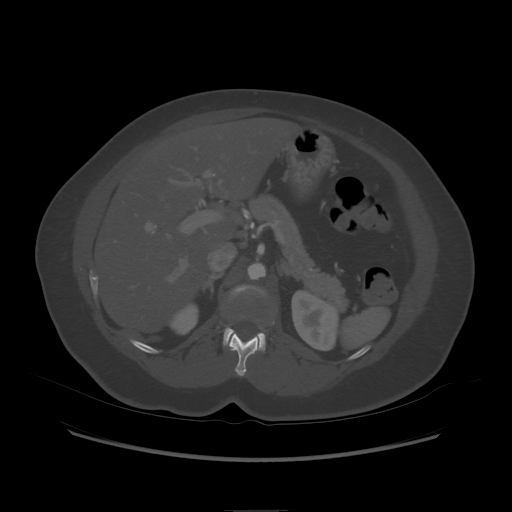

[10 of 33 positions shown; findings below may reference images not displayed]

FINDINGS: The aorta is non aneurysmal. There is circumferential irregular
plaque at the renal arteries. There is also significant plaque
between the renal arteries and IMA takeoff. Some of this plaque is
linear with calcification. It does result in luminal narrowing
estimated at 60%. The irregular plaque mimics dissection.

Mild atherosclerotic change at the origin of the celiac axis. Branch
vessels are patent.

Mild narrowing at the origin of the SMA. There is plaque just beyond
its origin causing non significant narrowing. Branch vessels are
grossly patent.

IMA is diminutive and patent. Branch vessels are patent. The left
renal artery is patent with atherosclerotic plaque. There is
significant disease at the origin of the right renal artery. Luminal
narrowing is difficult to precisely estimate.

50% narrowing at the origin of the right common iliac artery. There
is diffuse plaque involving the right internal and external iliac
arteries without significant focal narrowing.

Mild diffuse plaque involving the left common iliac artery. There is
a focal penetrating atherosclerotic ulcer on image 79 of series 5.
It is not flow limiting. Left external iliac artery is patent with
mild disease. The left internal iliac artery is severely diseased.

Moderate diffuse disease of the right common femoral artery. The
right superficial femoral artery is occluded at its origin. The
popliteal artery reconstitutes above the knee joint. Three vessel
runoff to the right ankle.

There is moderate plaque involving the left common femoral artery.
The left superficial femoral artery is diffusely diminutive and
moderately diseased without significant focal narrowing. Popliteal
artery is patent. Three vessel runoff to the left ankle.

Diffuse hepatic steatosis.

Small hiatal hernia.

Gallbladder, spleen, pancreas, adrenal glands are within normal
limits. Tiny hypodensities in the kidneys are too small to
characterize.

Prominent stool burden in the ascending and transverse colon. No
transition point. No focal lesion.

Uterus is absent.  Bladder and adnexa are within normal limits.

No free-fluid.  No abnormal retroperitoneal adenopathy.

Review of the MIP images confirms the above findings.
IMPRESSION: There is irregular plaque in the infrarenal aorta causing estimated
60% true luminal narrowing. Pressure gradient measurements are
recommended for correlation.

There is 50% narrowing at the origin of the right common iliac
artery.

There is occlusion of the superficial femoral artery from its
origin. The right popliteal artery reconstitutes.

Left superficial femoral artery is diffusely diseased but without
significant focal stenosis. Three vessel runoff to the left ankle.

## 2016-10-23 ENCOUNTER — Encounter: Payer: Self-pay | Admitting: Family Medicine

## 2016-11-11 ENCOUNTER — Ambulatory Visit (INDEPENDENT_AMBULATORY_CARE_PROVIDER_SITE_OTHER): Payer: BC Managed Care – PPO | Admitting: Physician Assistant

## 2016-11-11 VITALS — BP 120/60 | HR 85 | Temp 98.5°F | Resp 16 | Ht 59.5 in | Wt 130.0 lb

## 2016-11-11 DIAGNOSIS — E119 Type 2 diabetes mellitus without complications: Secondary | ICD-10-CM | POA: Diagnosis not present

## 2016-11-11 DIAGNOSIS — Z1211 Encounter for screening for malignant neoplasm of colon: Secondary | ICD-10-CM | POA: Diagnosis not present

## 2016-11-11 DIAGNOSIS — Z0000 Encounter for general adult medical examination without abnormal findings: Secondary | ICD-10-CM

## 2016-11-11 DIAGNOSIS — E7800 Pure hypercholesterolemia, unspecified: Secondary | ICD-10-CM | POA: Diagnosis not present

## 2016-11-11 DIAGNOSIS — Z794 Long term (current) use of insulin: Secondary | ICD-10-CM | POA: Diagnosis not present

## 2016-11-11 DIAGNOSIS — IMO0001 Reserved for inherently not codable concepts without codable children: Secondary | ICD-10-CM

## 2016-11-11 DIAGNOSIS — Z1231 Encounter for screening mammogram for malignant neoplasm of breast: Secondary | ICD-10-CM

## 2016-11-11 DIAGNOSIS — I10 Essential (primary) hypertension: Secondary | ICD-10-CM | POA: Diagnosis not present

## 2016-11-11 DIAGNOSIS — Z13228 Encounter for screening for other metabolic disorders: Secondary | ICD-10-CM

## 2016-11-11 DIAGNOSIS — Z130 Encounter for screening for diseases of the blood and blood-forming organs and certain disorders involving the immune mechanism: Secondary | ICD-10-CM | POA: Diagnosis not present

## 2016-11-11 DIAGNOSIS — Z1329 Encounter for screening for other suspected endocrine disorder: Secondary | ICD-10-CM | POA: Diagnosis not present

## 2016-11-11 DIAGNOSIS — Z1239 Encounter for other screening for malignant neoplasm of breast: Secondary | ICD-10-CM

## 2016-11-11 MED ORDER — METFORMIN HCL 1000 MG PO TABS: 1000.0000 mg | ORAL_TABLET | Freq: Two times a day (BID) | ORAL | 3 refills | Status: DC

## 2016-11-11 MED ORDER — ATORVASTATIN CALCIUM 10 MG PO TABS: 10.0000 mg | ORAL_TABLET | Freq: Every morning | ORAL | 3 refills | Status: DC

## 2016-11-11 MED ORDER — INSULIN DEGLUDEC 100 UNIT/ML ~~LOC~~ SOPN: 15.0000 [IU] | PEN_INJECTOR | Freq: Every day | SUBCUTANEOUS | 5 refills | Status: DC

## 2016-11-11 MED ORDER — LISINOPRIL 10 MG PO TABS: 10.0000 mg | ORAL_TABLET | Freq: Every morning | ORAL | 3 refills | Status: DC

## 2016-11-11 MED ORDER — AMLODIPINE BESYLATE 10 MG PO TABS: 10.0000 mg | ORAL_TABLET | Freq: Every day | ORAL | 3 refills | Status: DC

## 2016-11-11 NOTE — Progress Notes (Signed)
[facility] [address] [place] [address] [phone]  Date:  11/11/2016   Name:  Gabrielle [name]   DOB:  1956-10-02   MRN:  [id number]  PCP:  Lamar Blinks, MD    History of Present Illness:  Gabrielle [name] is a 61 y.o. female patient who presents to [facility] for annual physical exam.    Diet:  Water intake is 2 bottles.  Small portions.  Unsweetened tea  BM: normal.  No blood.  No constipation or diarrhea  Urination: normal.  No hematuria or frequency.    Sleep:  Sleep is fine  Social Activity:  Exercising: "every now and then" Recently moved to another house.   EtOH: none Illicit drug use: none Tobacco or vaping: none opthalmologist 3 years ago.  No change in vision that she knows of.   Refill of medication: No excessive thirst, nausea No chest pains, palpitiatons, or sob.  Leg swelling.   Patient Active Problem List   Diagnosis Date Noted  . Sepsis due to urinary tract infection ([place]) 02/02/2015  . Essential hypertension 02/02/2015  . Atherosclerosis of native arteries of extremity with intermittent claudication ([place]) 02/02/2015  . Sepsis ([place]) 02/02/2015  . Stress incontinence in female 03/06/2014  . 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 Diabetes mellitus ([place]) 02/28/2012  . High cholesterol 02/28/2012  . Hypertension 02/28/2012    Past Medical History:  Diagnosis Date  . Cataract   . Eczema   . Hyperlipidemia   . Hypertension   . Intrinsic (urethral) sphincter deficiency (ISD)   . Lactose intolerance   . RBBB   . SUI (stress urinary incontinence, female)   . Type 2 diabetes mellitus ([place])   . Wears dentures    UPPER  . Wears glasses     Past Surgical History:  Procedure Laterality Date  . ABDOMINAL HYSTERECTOMY  1998   fibroids  . ABDOMINOPLASTY  1999  . BLADDER SUSPENSION N/A 03/06/2014   Procedure: [facility] ;  Surgeon: Irine Seal, MD;  Location[facility];  Service: Urology;  Laterality:  N/A;  . CARDIOVASCULAR STRESS TEST  03-27-2008   NORMAL PERFUSION STUDY/  EF 54%  . COSMETIC SURGERY    . EXCISION LOWER ABDOMINAL SCAR POST ABDOMINOPLASTY  01-05-2000  . ORIF RIGHT ANKLE FX  12-24-2008  . RIGHT ANKLE ARTHROSCOPY W/ DEBRIDEMENT AND OPEN REMOVAL HARDWARE  03-12-2010  . TRANSTHORACIC ECHOCARDIOGRAM  08-05-2009   MILD LVH/  EF 60-65%  . TUBAL LIGATION      Social History  Substance Use Topics  . Smoking status: Former Smoker    Years: 20.00    Types: Cigarettes    Quit date: 11/14/2006  . Smokeless tobacco: Never Used  . Alcohol use No    Family History  Problem Relation Age of Onset  . Diabetes Mother   . Hypertension Mother   . Diabetes Sister   . Hypertension Sister   . Diabetes Brother   . Hypertension Brother   . Lupus Daughter     No Known Allergies  Medication list has been reviewed and updated.  Current Outpatient Prescriptions on File Prior to Visit  Medication Sig Dispense Refill  . amLODipine (NORVASC) 10 MG tablet Take 1 tablet (10 mg total) by mouth daily. 90 tablet 3  . aspirin 81 MG tablet Take 81 mg by mouth daily.    Marland Kitchen atorvastatin (LIPITOR) 10 MG tablet Take 1 tablet (10 mg total) by mouth every morning. [place]  tablet 3  . betamethasone dipropionate (DIPROLENE) 0.05 % cream Apply topically 2 (two) times daily.    . clobetasol cream (TEMOVATE) 0.25 % Apply 1 application topically as needed. 30 g 5  . glucose blood (ACCU-CHEK SMARTVIEW) test strip PATIENT NEEDS OFFICE VISIT FOR ADDITIONAL REFILLS - very overdue for DM check up. 100 each 0  . HYDROcodone-acetaminophen (NORCO/VICODIN) 5-325 MG per tablet Take 1 tablet by mouth every 8 (eight) hours as needed. 10 tablet 0  . Insulin Degludec (TRESIBA FLEXTOUCH) 100 UNIT/ML SOPN Inject 15 Units into the skin at bedtime. 3 mL 5  . lisinopril (PRINIVIL,ZESTRIL) 10 MG tablet Take 1 tablet (10 mg total) by mouth every morning. 90 tablet 3  . metFORMIN (GLUCOPHAGE) 1000 MG tablet Take 1 tablet (1,000 mg  total) by mouth 2 (two) times daily with a meal. 180 tablet 3  . clobetasol ointment (TEMOVATE) 4.27 % Apply 1 application topically as needed. 60 g 5  . insulin glargine (LANTUS) 100 UNIT/ML injection 15 units [place] nightly. Please dispense 5 pens. (Patient not taking: Reported on 11/11/2016) 5 vial 3  . methocarbamol (ROBAXIN) 500 MG tablet Take 1 tablet (500 mg total) by mouth 2 (two) times daily. (Patient not taking: Reported on 11/11/2016) 20 tablet 0  . naproxen (NAPROSYN) 500 MG tablet Take 1 tablet (500 mg total) by mouth 2 (two) times daily. (Patient not taking: Reported on 11/11/2016) 30 tablet 0  . traMADol (ULTRAM) 50 MG tablet Take 1 tablet (50 mg total) by mouth every 6 (six) hours as needed for severe pain. (Patient not taking: Reported on 11/11/2016) 15 tablet 0   No current facility-administered medications on file prior to visit.     Review of Systems  Constitutional: Negative for chills and fever.  HENT: Negative for ear discharge, ear pain and sore throat.   Eyes: Negative for blurred vision and double vision.  Respiratory: Negative for cough, shortness of breath and wheezing.   Cardiovascular: Negative for chest pain, palpitations and leg swelling.  Gastrointestinal: Negative for diarrhea, nausea and vomiting.  Genitourinary: Negative for dysuria, frequency and hematuria.  Skin: Negative for itching and rash.  Neurological: Negative for dizziness and headaches.     Physical Examination: BP 120/60 (BP Location: Left Arm, Patient Position: Sitting, Cuff Size: Normal)   Pulse 85   Temp 98.5 F (36.9 C) (Oral)   Resp 16   Ht 4' 11.5" (1.511 m)   Wt 130 lb (59 kg)   SpO2 97%   BMI 25.82 kg/m  Ideal Body Weight: Weight in (lb) to have BMI = 25: 125.6  Physical Exam  Constitutional: She is oriented to person, place, and time. She appears well-developed and well-nourished. No distress.  HENT:  Head: Normocephalic and atraumatic.  Right Ear: Tympanic membrane,  external ear and ear canal normal.  Left Ear: Tympanic membrane, external ear and ear canal normal.  Nose: Right sinus exhibits no maxillary sinus tenderness and no frontal sinus tenderness. Left sinus exhibits no maxillary sinus tenderness and no frontal sinus tenderness.  Mouth/Throat: Oropharynx is clear and moist. No uvula swelling. No oropharyngeal exudate, posterior oropharyngeal edema or posterior oropharyngeal erythema.  Eyes: Conjunctivae and EOM are normal. Pupils are equal, round, and reactive to light.  Neck: Normal range of motion. Neck supple. No thyromegaly present.  Cardiovascular: Normal rate, regular rhythm, normal heart sounds and intact distal pulses.  Exam reveals no gallop, no distant heart sounds and no friction rub.   No murmur heard. Pulmonary/Chest: Effort normal  and breath sounds normal. No respiratory distress. She has no decreased breath sounds. She has no wheezes. She has no rhonchi.  Abdominal: Soft. Bowel sounds are normal. She exhibits no distension and no mass. There is no tenderness.  Musculoskeletal: Normal range of motion. She exhibits no edema or tenderness.  Lymphadenopathy:       Head (right side): No submandibular, no tonsillar, no preauricular and no posterior auricular adenopathy present.       Head (left side): No submandibular, no tonsillar, no preauricular and no posterior auricular adenopathy present.    She has no cervical adenopathy.  Neurological: She is alert and oriented to person, place, and time. No cranial nerve deficit. She exhibits normal muscle tone. Coordination normal.  Skin: Skin is warm and dry. She is not diaphoretic.  Psychiatric: She has a normal mood and affect. Her behavior is normal.     Assessment and Plan: Gabrielle [name] is a 60 y.o. female who is here today for annual physical exam.  Annual physical exam - Plan: insulin degludec (TRESIBA FLEXTOUCH) 100 UNIT/ML SOPN FlexTouch Pen, lisinopril (PRINIVIL,ZESTRIL) 10 MG  tablet, metFORMIN (GLUCOPHAGE) 1000 MG tablet, amLODipine (NORVASC) 10 MG tablet, atorvastatin (LIPITOR) 10 MG tablet, Ambulatory referral to Gastroenterology, MM DIGITAL SCREENING BILATERAL  IDDM (insulin dependent diabetes mellitus) ([place]) - Plan: insulin degludec (TRESIBA FLEXTOUCH) 100 UNIT/ML SOPN FlexTouch Pen, metFORMIN (GLUCOPHAGE) 1000 MG tablet, Hemoglobin A1c  Essential hypertension - Plan: lisinopril (PRINIVIL,ZESTRIL) 10 MG tablet, amLODipine (NORVASC) 10 MG tablet  Pure hypercholesterolemia - Plan: atorvastatin (LIPITOR) 10 MG tablet, Lipid panel  Screening for metabolic disorder - Plan: CMP14+EGFR, Lipid panel  Screening for thyroid disorder - Plan: TSH  Screening for deficiency anemia - Plan: CBC  Special screening for malignant neoplasms, colon - Plan: Ambulatory referral to Gastroenterology  Screening for breast cancer - Plan: MM DIGITAL SCREENING BILATERAL   Ivar Drape, PA-C [facility] 11/11/2016 8:50 AM

## 2016-11-11 NOTE — Patient Instructions (Addendum)
Start exercising 4 times per week of heart pumping activity.   Please schedule an eye doctor.   Keeping You Healthy  Get These Tests  Blood Pressure- Have your blood pressure checked by your healthcare provider at least once a year.  Normal blood pressure is 120/80.  Weight- Have your body mass index (BMI) calculated to screen for obesity.  BMI is a measure of body fat based on height and weight.  You can calculate your own BMI at [employer].it  Cholesterol- Have your cholesterol checked every year.  Diabetes- Have your blood sugar checked every year if you have high blood pressure, high cholesterol, a family history of diabetes or if you are overweight.  Pap Test - Have a pap test every 1 to 5 years if you have been sexually active.  If you are older than 65 and recent pap tests have been normal you may not need additional pap tests.  In addition, if you have had a hysterectomy  for benign disease additional pap tests are not necessary.  Mammogram-Yearly mammograms are essential for early detection of breast cancer  Screening for Colon Cancer- Colonoscopy starting at age 45. Screening may begin sooner depending on your family history and other health conditions.  Follow up colonoscopy as directed by your Gastroenterologist.  Screening for Osteoporosis- Screening begins at age 67 with bone density scanning, sooner if you are at higher risk for developing Osteoporosis.  Get these medicines  Calcium with Vitamin D- Your body requires 1200-1500 mg of Calcium a day and [phone] IU of Vitamin D a day.  You can only absorb 500 mg of Calcium at a time therefore Calcium must be taken in 2 or 3 separate doses throughout the day.  Hormones- Hormone therapy has been associated with increased risk for certain cancers and heart disease.  Talk to your healthcare provider about if you need relief from menopausal symptoms.  Aspirin- Ask your healthcare provider about taking Aspirin to  prevent Heart Disease and Stroke.  Get these Immuniztions  Flu shot- Every fall  Pneumonia shot- Once after the age of 57; if you are younger ask your healthcare provider if you need a pneumonia shot.  Tetanus- Every ten years.  Zostavax- Once after the age of 50 to prevent shingles.  Take these steps  Don't smoke- Your healthcare provider can help you quit. For tips on how to quit, ask your healthcare provider or go to www.smokefree.gov or call 1-800 QUIT-NOW.  Be physically active- Exercise 5 days a week for a minimum of 30 minutes.  If you are not already physically active, start slow and gradually work up to 30 minutes of moderate physical activity.  Try walking, dancing, bike riding, swimming, etc.  Eat a healthy diet- Eat a variety of healthy foods such as fruits, vegetables, whole grains, low fat milk, low fat cheeses, yogurt, lean meats, chicken, fish, eggs, dried beans, tofu, etc.  For more information go to www.thenutritionsource.org  Dental visit- Brush and floss teeth twice daily; visit your dentist twice a year.  Eye exam- Visit your Optometrist or Ophthalmologist yearly.  Drink alcohol in moderation- Limit alcohol intake to one drink or less a day.  Never drink and drive.  Depression- Your emotional health is as important as your physical health.  If you're feeling down or losing interest in things you normally enjoy, please talk to your healthcare provider.  Seat Belts- can save your life; always wear one  Smoke/Carbon Monoxide detectors- These detectors need to be  installed on the appropriate level of your home.  Replace batteries at least once a year.  Violence- If anyone is threatening or hurting you, please tell your healthcare provider.  Living Will/ Health care power of attorney- Discuss with your healthcare provider and family.    IF you received an x-ray today, you will receive an invoice from [facility]. Please contact [facility] at  [phone] with questions or concerns regarding your invoice.   IF you received labwork today, you will receive an invoice from [facility]. Please contact [facility] at [phone] with questions or concerns regarding your invoice.   Our billing staff will not be able to assist you with questions regarding bills from these companies.  You will be contacted with the lab results as soon as they are available. The fastest way to get your results is to activate your My Chart account. Instructions are located on the last page of this paperwork. If you have not heard from [place] regarding the results in 2 weeks, please contact this office.

## 2016-11-12 LAB — CMP14+EGFR
ALBUMIN: 4.4 g/dL (ref 3.5–5.5)
ALT: 19 IU/L (ref 0–32)
AST: 25 IU/L (ref 0–40)
Albumin/Globulin Ratio: 1.1 — ABNORMAL LOW (ref 1.2–2.2)
Alkaline Phosphatase: 129 IU/L — ABNORMAL HIGH (ref 39–117)
BUN / CREAT RATIO: 13 (ref 9–23)
BUN: 9 mg/dL (ref 6–24)
Bilirubin Total: 0.2 mg/dL (ref 0.0–1.2)
CALCIUM: 10.2 mg/dL (ref 8.7–10.2)
CO2: 24 mmol/L (ref 18–29)
CREATININE: 0.69 mg/dL (ref 0.57–1.00)
Chloride: 98 mmol/L (ref 96–106)
GFR, EST AFRICAN AMERICAN: 110 mL/min/{1.73_m2} (ref >59)
GFR, EST NON AFRICAN AMERICAN: 96 mL/min/{1.73_m2} (ref >59)
GLOBULIN, TOTAL: 4.1 g/dL (ref 1.5–4.5)
Glucose: 127 mg/dL — ABNORMAL HIGH (ref 65–99)
Potassium: 4.3 mmol/L (ref 3.5–5.2)
SODIUM: 139 mmol/L (ref 134–144)
TOTAL PROTEIN: 8.5 g/dL (ref 6.0–8.5)

## 2016-11-12 LAB — CBC
HEMATOCRIT: 41.5 % (ref 34.0–46.6)
HEMOGLOBIN: 13.9 g/dL (ref 11.1–15.9)
MCH: 29.6 pg (ref 26.6–33.0)
MCHC: 33.5 g/dL (ref 31.5–35.7)
MCV: 88 fL (ref 79–97)
Platelets: 364 10*3/uL (ref 150–379)
RBC: 4.7 x10E6/uL (ref 3.77–5.28)
RDW: 13.6 % (ref 12.3–15.4)
WBC: 6.8 10*3/uL (ref 3.4–10.8)

## 2016-11-12 LAB — LIPID PANEL
CHOL/HDL RATIO: 5 ratio — AB (ref 0.0–4.4)
Cholesterol, Total: 196 mg/dL (ref 100–199)
HDL: 39 mg/dL — ABNORMAL LOW (ref >39)
LDL CALC: 129 mg/dL — AB (ref 0–99)
Triglycerides: 142 mg/dL (ref 0–149)
VLDL Cholesterol Cal: 28 mg/dL (ref 5–40)

## 2016-11-12 LAB — TSH: TSH: 1.36 u[IU]/mL (ref 0.450–4.500)

## 2016-11-12 LAB — HEMOGLOBIN A1C
ESTIMATED AVERAGE GLUCOSE: 209 mg/dL
Hgb A1c MFr Bld: 8.9 % — ABNORMAL HIGH (ref 4.8–5.6)

## 2016-11-15 ENCOUNTER — Other Ambulatory Visit: Payer: Self-pay | Admitting: Physician Assistant

## 2016-11-15 DIAGNOSIS — Z1231 Encounter for screening mammogram for malignant neoplasm of breast: Secondary | ICD-10-CM

## 2016-11-17 ENCOUNTER — Ambulatory Visit
Admission: RE | Admit: 2016-11-17 | Discharge: 2016-11-17 | Disposition: A | Discharge status: Home / Self Care | Payer: BC Managed Care – PPO | Source: Ambulatory Visit | Attending: Physician Assistant | Admitting: Physician Assistant

## 2016-11-17 ENCOUNTER — Telehealth: Payer: Self-pay | Admitting: Gastroenterology

## 2016-11-17 DIAGNOSIS — Z1231 Encounter for screening mammogram for malignant neoplasm of breast: Secondary | ICD-10-CM

## 2016-11-21 ENCOUNTER — Encounter: Payer: Self-pay | Admitting: Physician Assistant

## 2016-11-22 ENCOUNTER — Encounter: Payer: Self-pay | Admitting: Gastroenterology

## 2016-11-22 NOTE — Telephone Encounter (Signed)
LM ON VMAIL TO CB TO SCHEDULE COLONOSCOPY RECORDS REVIEWED

## 2017-01-16 ENCOUNTER — Ambulatory Visit (AMBULATORY_SURGERY_CENTER): Payer: Self-pay | Admitting: *Deleted

## 2017-01-16 VITALS — Ht <= 58 in | Wt 134.6 lb

## 2017-01-16 DIAGNOSIS — Z85040 Personal history of malignant carcinoid tumor of rectum: Secondary | ICD-10-CM

## 2017-01-16 MED ORDER — NA SULFATE-K SULFATE-MG SULF 17.5-3.13-1.6 GM/177ML PO SOLN: ORAL | 0 refills | Status: DC

## 2017-01-16 NOTE — Progress Notes (Signed)
No egg or soy allergy  No anesthesia or intubation problems per pt  No diet medications taken  No hx of sleep apnea or oxygen use

## 2017-01-17 ENCOUNTER — Encounter: Payer: Self-pay | Admitting: Gastroenterology

## 2017-01-30 ENCOUNTER — Ambulatory Visit (AMBULATORY_SURGERY_CENTER): Payer: BC Managed Care – PPO | Admitting: Gastroenterology

## 2017-01-30 ENCOUNTER — Encounter: Payer: Self-pay | Admitting: Gastroenterology

## 2017-01-30 VITALS — BP 130/77 | HR 77 | Temp 96.9°F | Resp 12 | Ht <= 58 in | Wt 134.0 lb

## 2017-01-30 DIAGNOSIS — D129 Benign neoplasm of anus and anal canal: Secondary | ICD-10-CM

## 2017-01-30 DIAGNOSIS — C20 Malignant neoplasm of rectum: Secondary | ICD-10-CM | POA: Diagnosis not present

## 2017-01-30 DIAGNOSIS — D128 Benign neoplasm of rectum: Secondary | ICD-10-CM

## 2017-01-30 DIAGNOSIS — D3A026 Benign carcinoid tumor of the rectum: Secondary | ICD-10-CM

## 2017-01-30 DIAGNOSIS — D123 Benign neoplasm of transverse colon: Secondary | ICD-10-CM

## 2017-01-30 DIAGNOSIS — Z86012 Personal history of benign carcinoid tumor: Secondary | ICD-10-CM

## 2017-01-30 MED ORDER — SODIUM CHLORIDE 0.9 % IV SOLN: 500.0000 mL | INTRAVENOUS | Status: DC

## 2017-01-30 NOTE — Op Note (Signed)
[facility] Patient Name: Gabrielle [name] Procedure Date: 01/30/2017 8:27 AM MRN: [id number] Endoscopist: Remo Lipps P. Cerissa Zeiger MD, MD Age: 61 Referring MD:  Date of Birth: [birth year] Gender: Female Account #: [id number] Procedure:                Colonoscopy Indications:              High risk colon cancer surveillance: Personal                            history of colonic polyps (benign rectal carcinoid,                            removed several years ago) Medicines:                Monitored Anesthesia Care Procedure:                Pre-Anesthesia Assessment:                           - Prior to the procedure, a History and Physical                            was performed, and patient medications and                            allergies were reviewed. The patient's tolerance of                            previous anesthesia was also reviewed. The risks                            and benefits of the procedure and the sedation                            options and risks were discussed with the patient.                            All questions were answered, and informed consent                            was obtained. Prior Anticoagulants: The patient has                            taken aspirin, last dose was 1 day prior to                            procedure. ASA Grade Assessment: III - A patient                            with severe systemic disease. After reviewing the                            risks and benefits, the patient was deemed in  satisfactory condition to undergo the procedure.                           After obtaining informed consent, the colonoscope                            was passed under direct vision. Throughout the                            procedure, the patient's blood pressure, pulse, and                            oxygen saturations were monitored continuously. The                            Colonoscope was  introduced through the anus and                            advanced to the the cecum, identified by                            appendiceal orifice and ileocecal valve. The                            quality of the bowel preparation was good. The                            ileocecal valve, appendiceal orifice, and rectum                            were photographed. Scope In: 8:35:22 AM Scope Out: 8:53:30 AM Scope Withdrawal Time: 0 hours 15 minutes 18 seconds  Total Procedure Duration: 0 hours 18 minutes 8 seconds  Findings:                 The perianal and digital rectal examinations were                            normal.                           A 4 mm polyp was found in the transverse colon. The                            polyp was sessile. The polyp was removed with a                            cold snare. Resection and retrieval were complete.                           A 4-5 mm polyp was found in the rectum. The polyp                            was sessile. The polyp was removed with a cold  snare. Resection and retrieval were complete.                           A medium post polypectomy scar was found in the                            rectum. The scar tissue was healthy in appearance.                           The exam was otherwise without abnormality. Complications:            No immediate complications. Estimated blood loss:                            Minimal. Estimated Blood Loss:     Estimated blood loss was minimal. Impression:               - One 4 mm polyp in the transverse colon, removed                            with a cold snare. Resected and retrieved.                           - One 4-5 mm polyp in the rectum, removed with a                            cold snare. Resected and retrieved.                           - Post-polypectomy scar in the rectum.                           - The examination was otherwise normal. Recommendation:            - Patient has a contact number available for                            emergencies. The signs and symptoms of potential                            delayed complications were discussed with the                            patient. Return to normal activities tomorrow.                            Written discharge instructions were provided to the                            patient.                           - Resume previous diet.                           - Continue present medications.                           -  No ibuprofen, naproxen, or other non-steroidal                            anti-inflammatory drugs for 2 weeks after polyp                            removal.                           - Await pathology results.                           - Repeat colonoscopy is recommended for                            surveillance. The colonoscopy date will be                            determined after pathology results from today's                            exam become available for review. Remo Lipps P. Darriona Dehaas MD, MD 01/30/2017 8:57:29 AM This report has been signed electronically.

## 2017-01-30 NOTE — Progress Notes (Signed)
Called to room to assist during endoscopic procedure.  Patient ID and intended procedure confirmed with present staff. Received instructions for my participation in the procedure from the performing physician.  

## 2017-01-30 NOTE — Progress Notes (Signed)
Report to PACU, RN, vss, BBS= Clear.  

## 2017-01-30 NOTE — Patient Instructions (Signed)
YOU HAD AN ENDOSCOPIC PROCEDURE TODAY AT [facility]:   Refer to the procedure report that was given to you for any specific questions about what was found during the examination.  If the procedure report does not answer your questions, please call your gastroenterologist to clarify.  If you requested that your care partner not be given the details of your procedure findings, then the procedure report has been included in a sealed envelope for you to review at your convenience later.  YOU SHOULD EXPECT: Some feelings of bloating in the abdomen. Passage of more gas than usual.  Walking can help get rid of the air that was put into your GI tract during the procedure and reduce the bloating. If you had a lower endoscopy (such as a colonoscopy or flexible sigmoidoscopy) you may notice spotting of blood in your stool or on the toilet paper. If you underwent a bowel prep for your procedure, you may not have a normal bowel movement for a few days.  Please Note:  You might notice some irritation and congestion in your nose or some drainage.  This is from the oxygen used during your procedure.  There is no need for concern and it should clear up in a day or so.  SYMPTOMS TO REPORT IMMEDIATELY:   Following lower endoscopy (colonoscopy or flexible sigmoidoscopy):  Excessive amounts of blood in the stool  Significant tenderness or worsening of abdominal pains  Swelling of the abdomen that is new, acute  Fever of 100F or higher    For urgent or emergent issues, a gastroenterologist can be reached at any hour by calling [phone].   DIET:  We do recommend a small meal at first, but then you may proceed to your regular diet.  Drink plenty of fluids but you should avoid alcoholic beverages for 24 hours.  ACTIVITY:  You should plan to take it easy for the rest of today and you should NOT DRIVE or use heavy machinery until tomorrow (because of the sedation medicines used during the test).     FOLLOW UP: Our staff will call the number listed on your records the next business day following your procedure to check on you and address any questions or concerns that you may have regarding the information given to you following your procedure. If we do not reach you, we will leave a message.  However, if you are feeling well and you are not experiencing any problems, there is no need to return our call.  We will assume that you have returned to your regular daily activities without incident.  If any biopsies were taken you will be contacted by phone or by letter within the next 1-3 weeks.  Please call us at [phone] if you have not heard about the biopsies in 3 weeks.   Await for biopsy results to determined next repeat colonoscopy screening. Polyps (handout given) No Ibuprofen, Naproxen, or other non-steroidal anti-inflammatory drugs for 2 weeks after polyps removal.   SIGNATURES/CONFIDENTIALITY: You and/or your care partner have signed paperwork which will be entered into your electronic medical record.  These signatures attest to the fact that that the information above on your After Visit Summary has been reviewed and is understood.  Full responsibility of the confidentiality of this discharge information lies with you and/or your care-partner.

## 2017-01-31 ENCOUNTER — Telehealth: Payer: Self-pay | Admitting: *Deleted

## 2017-01-31 NOTE — Telephone Encounter (Signed)
  Follow up Call-  Call back number 01/30/2017  Post procedure Call Back phone  # [phone] number  Permission to leave phone message No  Some recent data might be hidden     Patient questions:  Do you have a fever, pain , or abdominal swelling? No. Pain Score  0 *  Have you tolerated food without any problems? Yes.    Have you been able to return to your normal activities? Yes.    Do you have any questions about your discharge instructions: Diet   No. Medications  No. Follow up visit  No.  Do you have questions or concerns about your Care? No.  Actions: * If pain score is 4 or above: No action needed, pain <4.

## 2017-02-03 ENCOUNTER — Telehealth: Payer: Self-pay | Admitting: Gastroenterology

## 2017-02-03 ENCOUNTER — Telehealth: Payer: Self-pay

## 2017-02-03 NOTE — Telephone Encounter (Signed)
Called patient back, had to lvm that path results are not finished yet and that Dr. Havery Moros is out of the office today. Hopefully we will have those results for her by early next week and as soon as we do will call her.

## 2017-02-03 NOTE — Telephone Encounter (Signed)
Dr. 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 He will be signing those results by the end of the day. He was aware that you are not in the office today.

## 2017-02-06 NOTE — Telephone Encounter (Signed)
I will contact patient with results and recommendations. See path results for details

## 2017-02-07 ENCOUNTER — Other Ambulatory Visit: Payer: Self-pay

## 2017-02-07 DIAGNOSIS — D3A026 Benign carcinoid tumor of the rectum: Secondary | ICD-10-CM

## 2017-02-24 ENCOUNTER — Telehealth: Payer: Self-pay | Admitting: Gastroenterology

## 2017-02-24 NOTE — Telephone Encounter (Signed)
Pt questions have been answered regarding the lower EUS.  She will call with any further concerns

## 2017-02-24 NOTE — Telephone Encounter (Signed)
Left message on machine to call back  

## 2017-03-01 ENCOUNTER — Encounter (HOSPITAL_COMMUNITY): Payer: Self-pay | Admitting: *Deleted

## 2017-03-02 ENCOUNTER — Encounter (HOSPITAL_COMMUNITY)
Admission: RE | Disposition: A | Discharge status: Home / Self Care | Payer: Self-pay | Source: Ambulatory Visit | Attending: Gastroenterology

## 2017-03-02 ENCOUNTER — Ambulatory Visit (HOSPITAL_COMMUNITY)
Admission: RE | Admit: 2017-03-02 | Discharge: 2017-03-02 | Disposition: A | Discharge status: Home / Self Care | Payer: BC Managed Care – PPO | Source: Ambulatory Visit | Attending: Gastroenterology | Admitting: Gastroenterology

## 2017-03-02 ENCOUNTER — Encounter (HOSPITAL_COMMUNITY): Payer: Self-pay | Admitting: Gastroenterology

## 2017-03-02 ENCOUNTER — Telehealth: Payer: Self-pay

## 2017-03-02 ENCOUNTER — Ambulatory Visit (HOSPITAL_COMMUNITY): Payer: BC Managed Care – PPO | Admitting: Registered Nurse

## 2017-03-02 DIAGNOSIS — Z85040 Personal history of malignant carcinoid tumor of rectum: Secondary | ICD-10-CM | POA: Insufficient documentation

## 2017-03-02 DIAGNOSIS — Z08 Encounter for follow-up examination after completed treatment for malignant neoplasm: Secondary | ICD-10-CM | POA: Diagnosis present

## 2017-03-02 DIAGNOSIS — Z794 Long term (current) use of insulin: Secondary | ICD-10-CM | POA: Insufficient documentation

## 2017-03-02 DIAGNOSIS — Z79899 Other long term (current) drug therapy: Secondary | ICD-10-CM | POA: Insufficient documentation

## 2017-03-02 DIAGNOSIS — E785 Hyperlipidemia, unspecified: Secondary | ICD-10-CM | POA: Insufficient documentation

## 2017-03-02 DIAGNOSIS — Z87891 Personal history of nicotine dependence: Secondary | ICD-10-CM | POA: Diagnosis not present

## 2017-03-02 DIAGNOSIS — E119 Type 2 diabetes mellitus without complications: Secondary | ICD-10-CM | POA: Insufficient documentation

## 2017-03-02 DIAGNOSIS — D3A026 Benign carcinoid tumor of the rectum: Secondary | ICD-10-CM

## 2017-03-02 DIAGNOSIS — Z7982 Long term (current) use of aspirin: Secondary | ICD-10-CM | POA: Diagnosis not present

## 2017-03-02 DIAGNOSIS — I10 Essential (primary) hypertension: Secondary | ICD-10-CM | POA: Insufficient documentation

## 2017-03-02 HISTORY — PX: EUS: SHX5427

## 2017-03-02 LAB — GLUCOSE, CAPILLARY: Glucose-Capillary: 204 mg/dL — ABNORMAL HIGH (ref 65–99)

## 2017-03-02 SURGERY — ULTRASOUND, LOWER GI TRACT, ENDOSCOPIC
Anesthesia: Monitor Anesthesia Care

## 2017-03-02 MED ORDER — LACTATED RINGERS IV SOLN
INTRAVENOUS | Status: DC
  Administered 2017-03-02: 1000 mL via INTRAVENOUS
  Administered 2017-03-02: 09:00:00 via INTRAVENOUS

## 2017-03-02 MED ORDER — SODIUM CHLORIDE 0.9 % IV SOLN: INTRAVENOUS | Status: DC

## 2017-03-02 MED ORDER — LIDOCAINE 2% (20 MG/ML) 5 ML SYRINGE
INTRAMUSCULAR | Status: AC
  Filled 2017-03-02: qty 5

## 2017-03-02 MED ORDER — LIDOCAINE HCL (CARDIAC) 20 MG/ML IV SOLN
INTRAVENOUS | Status: DC | PRN
  Administered 2017-03-02: 40 mg via INTRAVENOUS

## 2017-03-02 MED ORDER — PROPOFOL 10 MG/ML IV BOLUS
INTRAVENOUS | Status: AC
  Filled 2017-03-02: qty 40

## 2017-03-02 MED ORDER — PROPOFOL 10 MG/ML IV BOLUS
INTRAVENOUS | Status: DC | PRN
  Administered 2017-03-02 (×3): 40 mg via INTRAVENOUS

## 2017-03-02 MED ORDER — GLYCOPYRROLATE 0.2 MG/ML IV SOSY
PREFILLED_SYRINGE | INTRAVENOUS | Status: AC
  Filled 2017-03-02: qty 5

## 2017-03-02 MED ORDER — ONDANSETRON HCL 4 MG/2ML IJ SOLN
INTRAMUSCULAR | Status: DC | PRN
  Administered 2017-03-02: 4 mg via INTRAVENOUS

## 2017-03-02 NOTE — H&P (Signed)
  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 Benson Norway small rectal carcinoid 2009 flex sig Dr. Benson Norway; biopsies of previous site neg for carcinoid 2011 flex sig Dr. Benson Norway; on HP polyps in rectum 2018 colonoscopy Dr. Havery Moros; small rectal carcinoid  ROS: complete GI ROS as described in HPI.  Constitutional:  No unintentional weight loss   Past Medical History:  Diagnosis Date  . Cancer ([place])    rectal CA  . Cataract   . Eczema   . Hyperlipidemia   . Hypertension   . Intrinsic (urethral) sphincter deficiency (ISD)   . RBBB   . SUI (stress urinary incontinence, female)    hx of  . Type 2 diabetes mellitus ([place])   . Wears dentures    UPPER  . Wears glasses     Past Surgical History:  Procedure Laterality Date  . ABDOMINAL HYSTERECTOMY  1998   fibroids, partial  . ABDOMINOPLASTY  1999  . BLADDER SUSPENSION N/A 03/06/2014   Procedure: [facility] SLING ;  Surgeon: Irine Seal, MD;  Location: [facility];  Service: Urology;  Laterality: N/A;  . CARDIOVASCULAR STRESS TEST  03-27-2008   NORMAL PERFUSION STUDY/  EF 54%  . COLON SURGERY  2010   for rectal ca  . COLONOSCOPY    . EXCISION LOWER ABDOMINAL SCAR POST ABDOMINOPLASTY  01-05-2000  . ORIF RIGHT ANKLE FX  12-24-2008  . RIGHT ANKLE ARTHROSCOPY W/ DEBRIDEMENT AND OPEN REMOVAL HARDWARE  03-12-2010  . TRANSTHORACIC ECHOCARDIOGRAM  08-05-2009   MILD LVH/  EF 60-65%  . TUBAL LIGATION      No current facility-administered medications for this encounter.     Allergies as of 02/07/2017  . (No Known Allergies)    Family History  Problem Relation Age of Onset  . Diabetes Mother   . Hypertension Mother   . Diabetes Sister   . Hypertension Sister   . Diabetes Brother   . Hypertension Brother   . Lupus Daughter   . Colon cancer Neg Hx   . Esophageal cancer Neg Hx   . Rectal cancer Neg Hx   . Stomach cancer Neg Hx     Social History   Social History  .  Marital status: Married    Spouse name: N/A  . Number of children: N/A  . Years of education: N/A   Occupational History  . Not on file.   Social History Main Topics  . Smoking status: Former Smoker    Years: 20.00    Types: Cigarettes    Quit date: 11/14/2006  . Smokeless tobacco: Never Used  . Alcohol use No  . Drug use: No  . Sexual activity: Not on file   Other Topics Concern  . Not on file   Social History Narrative  . No narrative on file     Physical Exam: There were no vitals taken for this visit. Constitutional: generally well-appearing Psychiatric: alert and oriented x3 Abdomen: soft, nontender, nondistended, no obvious ascites, no peritoneal signs, normal bowel sounds No peripheral edema noted in lower extremities  Assessment and plan: 61 y.o. female with recurrent rectal carcinoid  For lower EUS today  Please see the "Patient Instructions" section for addition details about the plan.  Owens Loffler, MD [facility] 03/02/2017, 7:36 AM

## 2017-03-02 NOTE — Op Note (Signed)
[facility] Patient Name: Gabrielle [name] Procedure Date: 03/02/2017 MRN: [id number] Attending MD: Milus Banister , MD Date of Birth: Oct 09, 1956 CSN: [id number] Age: 61 Admit Type: Outpatient Procedure:                Lower EUS Indications:              recurrent rectal carcinoid: 2009 colonoscopy Dr.                            Benson Norway small rectal carcinoid, 2009 flex sig Dr.                            Benson Norway; biopsies of previous site neg for carcinoid,                            2011 flex sig Dr. Benson Norway; on HP polyps in rectum,                            2018 colonoscopy Dr. Havery Moros; small rectal                            carcinoid Providers:                Milus Banister, MD, Hilma Favors, RN, Cletis Athens, Technician, Tinnie Gens, Technician Referring MD:             Jolly Mango, MD Medicines:                Monitored Anesthesia Care Complications:            No immediate complications. Estimated blood loss:                            None. Estimated Blood Loss:     Estimated blood loss: none. Procedure:                Pre-Anesthesia Assessment:                           - Prior to the procedure, a History and Physical                            was performed, and patient medications and                            allergies were reviewed. The patient's tolerance of                            previous anesthesia was also reviewed. The risks                            and benefits of the procedure and the sedation  options and risks were discussed with the patient.                            All questions were answered, and informed consent                            was obtained. Prior Anticoagulants: The patient has                            taken no previous anticoagulant or antiplatelet                            agents. ASA Grade Assessment: II - A patient with                            mild systemic  disease. After reviewing the risks                            and benefits, the patient was deemed in                            satisfactory condition to undergo the procedure.                           After obtaining informed consent, the endoscope was                            passed under direct vision. Throughout the                            procedure, the patient's blood pressure, pulse, and                            oxygen saturations were monitored continuously. The                            [id number] ([id number]) scope was introduced through                            the anus and advanced to the the sigmoid colon. The                            lower EUS was accomplished without difficulty. The                            patient tolerated the procedure well. Scope In: Scope Out: Findings:      Sigmoidoscopic evaluation.      The entire examined colon (rectum, sigmoid) appeared normal. The recent       cold snare polypectomy site was not visible on this exam.      Endosonographic Finding :      The rectal wall echolayers were normal. No submucosal tumor Impression:               - The rectal wall  echolayering was normal (no                            submucosal tumor). Moderate Sedation:      N/A- Per Anesthesia Care Recommendation:           - Discharge patient to home (ambulatory).                           - Repeat flexible sigmoidoscopy in 1 year for                            surveillance. Procedure Code(s):        --- Professional ---                           [phone], Sigmoidoscopy, flexible; with endoscopic                            ultrasound examination Diagnosis Code(s):        --- Professional ---                           Z08, Encounter for follow-up examination after                            completed treatment for malignant neoplasm                           Z85.048, Personal history of other malignant                            neoplasm of rectum,  rectosigmoid junction, and anus CPT copyright 2016 American Medical Association. All rights reserved. The codes documented in this report are preliminary and upon coder review may  be revised to meet current compliance requirements. Milus Banister, MD 03/02/2017 9:31:13 AM This report has been signed electronically. Number of Addenda: 0

## 2017-03-02 NOTE — Anesthesia Preprocedure Evaluation (Addendum)
Anesthesia Evaluation  Patient identified by MRN, date of birth, ID band Patient awake    Reviewed: Allergy & Precautions, NPO status , Patient's Chart, lab work & pertinent test results  Airway Mallampati: II       Dental no notable dental hx. (+) Edentulous Upper, Dental Advisory Given   Pulmonary former smoker,    breath sounds clear to auscultation       Cardiovascular hypertension, + dysrhythmias  Rhythm:Regular Rate:Normal     Neuro/Psych    GI/Hepatic   Endo/Other  diabetes  Renal/GU      Musculoskeletal   Abdominal   Peds  Hematology   Anesthesia Other Findings   Reproductive/Obstetrics                            Anesthesia Physical Anesthesia Plan  ASA: III  Anesthesia Plan: MAC   Post-op Pain Management:    Induction: Intravenous  Airway Management Planned: Natural Airway and Nasal Cannula  Additional Equipment:   Intra-op Plan:   Post-operative Plan:   Informed Consent: I have reviewed the patients History and Physical, chart, labs and discussed the procedure including the risks, benefits and alternatives for the proposed anesthesia with the patient or authorized representative who has indicated his/her understanding and acceptance.     Plan Discussed with: CRNA  Anesthesia Plan Comments:         Anesthesia Quick Evaluation

## 2017-03-02 NOTE — Telephone Encounter (Signed)
-----   Message from Manus Gunning, MD sent at 03/02/2017 11:53 AM EDT ----- Thanks Linna Hoff, I appreciate your help and the follow up.  Almyra Free can you please place a recall for flex sig for this patient in one year? Thanks  Richardson Landry  ----- Message ----- From: Milus Banister, MD Sent: 03/02/2017   9:32 AM To: Manus Gunning, MD  Richardson Landry Just completed EUS.  Rectal wall is normal; no submucosal tumors.  I could not identify the site of cold snare polypectomy last month.  Would repeat flex sig in 1 year.  I reassured her.  Thanks  dj

## 2017-03-02 NOTE — Discharge Instructions (Signed)
YOU HAD AN ENDOSCOPIC PROCEDURE TODAY: Refer to the procedure report that was given to you for any specific questions about what was found during the examination.  If the procedure report does not answer your questions, please call your gastroenterologist to clarify.  YOU SHOULD EXPECT: Some feelings of bloating in the abdomen. Passage of more gas than usual.  Walking can help get rid of the air that was put into your GI tract during the procedure and reduce the bloating. If you had a lower endoscopy (such as a colonoscopy or flexible sigmoidoscopy) you may notice spotting of blood in your stool or on the toilet paper.   DIET: Your first meal following the procedure should be a light meal and then it is ok to progress to your normal diet.  A half-sandwich or bowl of soup is an example of a good first meal.  Heavy or fried foods are harder to digest and may make you feel nasueas or bloated.  Drink plenty of fluids but you should avoid alcoholic beverages for 24 hours.  ACTIVITY: Your care partner should take you home directly after the procedure.  You should plan to take it easy, moving slowly for the rest of the day.  You can resume normal activity the day after the procedure however you should NOT DRIVE or use heavy machinery for 24 hours (because of the sedation medicines used during the test).    SYMPTOMS TO REPORT IMMEDIATELY  A gastroenterologist can be reached at any hour.  Please call your doctor's office for any of the following symptoms:   Following lower endoscopy (colonoscopy, flexible sigmoidoscopy)  Excessive amounts of blood in the stool  Significant tenderness, worsening of abdominal pains  Swelling of the abdomen that is new, acute  Fever of 100 or higher  Black, tarry-looking stools  FOLLOW UP: If any biopsies were taken you will be contacted by phone or by letter within the next 1-3 weeks.  Call your gastroenterologist if you have not heard about the biopsies in 3 weeks.    Please also call your gastroenterologist's office with any specific questions about appointments or follow up tests.

## 2017-03-02 NOTE — Anesthesia Postprocedure Evaluation (Addendum)
Anesthesia Post Note  Patient: Gabrielle [name]  Procedure(s) Performed: Procedure(s) (LRB): LOWER ENDOSCOPIC ULTRASOUND (EUS) (N/A)  Patient location during evaluation: Endoscopy Anesthesia Type: MAC Level of consciousness: awake and alert Pain management: pain level controlled Vital Signs Assessment: post-procedure vital signs reviewed and stable Respiratory status: spontaneous breathing, nonlabored ventilation, respiratory function stable and patient connected to nasal cannula oxygen Cardiovascular status: stable and blood pressure returned to baseline Anesthetic complications: no       Last Vitals:  Vitals:   03/02/17 0940 03/02/17 0950  BP: 123/65 (!) 128/54  Pulse: 75 76  Resp: (!) 26 17  Temp:      Last Pain:  Vitals:   03/02/17 0931  TempSrc: Oral                 Rommie Dunn,JAMES TERRILL

## 2017-03-02 NOTE — Telephone Encounter (Signed)
Recall placed for 1 year repeat flex sig.

## 2017-03-02 NOTE — Transfer of Care (Signed)
Immediate Anesthesia Transfer of Care Note  Patient: Gabrielle [name]  Procedure(s) Performed: Procedure(s): LOWER ENDOSCOPIC ULTRASOUND (EUS) (N/A)  Patient Location: PACU endoscopy  Anesthesia Type:MAC  Level of Consciousness: sedated  Airway & Oxygen Therapy: Patient Spontanous Breathing and Patient connected to face mask oxygen  Post-op Assessment: Report given to RN and Post -op Vital signs reviewed and stable  Post vital signs: Reviewed and stable  Last Vitals:  Vitals:   03/02/17 0816  BP: (!) 142/70  Pulse: 79  Resp: 16  Temp: 37.2 C    Last Pain:  Vitals:   03/02/17 0816  TempSrc: Oral         Complications: No apparent anesthesia complications

## 2017-03-03 ENCOUNTER — Encounter (HOSPITAL_COMMUNITY): Payer: Self-pay | Admitting: Gastroenterology

## 2017-04-09 ENCOUNTER — Encounter (HOSPITAL_COMMUNITY): Payer: Self-pay | Admitting: Emergency Medicine

## 2017-04-09 ENCOUNTER — Ambulatory Visit (HOSPITAL_COMMUNITY)
Admission: EM | Admit: 2017-04-09 | Discharge: 2017-04-09 | Disposition: A | Discharge status: Home / Self Care | Payer: BC Managed Care – PPO | Attending: Family Medicine | Admitting: Family Medicine

## 2017-04-09 DIAGNOSIS — N3001 Acute cystitis with hematuria: Secondary | ICD-10-CM | POA: Diagnosis not present

## 2017-04-09 LAB — POCT URINALYSIS DIP (DEVICE)
GLUCOSE, UA: NEGATIVE mg/dL
Nitrite: POSITIVE — AB
PH: 5 (ref 5.0–8.0)
Protein, ur: 30 mg/dL — AB
Urobilinogen, UA: 0.2 mg/dL (ref 0.0–1.0)

## 2017-04-09 MED ORDER — ALIGN 4 MG PO CAPS: 1.0000 | ORAL_CAPSULE | Freq: Every day | ORAL | 2 refills | Status: DC

## 2017-04-09 MED ORDER — MICONAZOLE NITRATE 1200 & 2 MG & % VA KIT: 1.0000 | PACK | Freq: Once | VAGINAL | 0 refills | Status: AC

## 2017-04-09 MED ORDER — FLUCONAZOLE 200 MG PO TABS: ORAL_TABLET | ORAL | 0 refills | Status: DC

## 2017-04-09 MED ORDER — CEPHALEXIN 500 MG PO CAPS: 500.0000 mg | ORAL_CAPSULE | Freq: Three times a day (TID) | ORAL | 0 refills | Status: DC

## 2017-04-09 NOTE — ED Provider Notes (Signed)
CSN: [id number]     Arrival date & time 04/09/17  1205 History   None    Chief Complaint  Patient presents with  . Urinary Tract Infection   (Consider location/radiation/quality/duration/timing/severity/associated sxs/prior Treatment)  HPI   Patient is a 61-year-old female visiting today with complaints of a urinary tract infection 3-4 days. Patient states last time she took an antibiotic for UTI was a proximally 2 years ago. Patient reports burning frequency and foul odor. In addition the patient states she recently took an antibiotic for an eye infection and developed a yeast infection for which the provider refused to give her medication. Patient has history of hypertension and diabetes for which she states she is taking her medications as directed. In addition, the patient has been taking AZO over-the-counter 2 days.  Past Medical History:  Diagnosis Date  . Cancer ([facility])    rectal CA  . Cataract   . Eczema   . Hyperlipidemia   . Hypertension   . Intrinsic (urethral) sphincter deficiency (ISD)   . RBBB   . SUI (stress urinary incontinence, female)    hx of  . Type 2 diabetes mellitus ([facility])   . Wears dentures    UPPER  . Wears glasses    Past Surgical History:  Procedure Laterality Date  . ABDOMINAL HYSTERECTOMY  1998   fibroids, partial  . ABDOMINOPLASTY  1999  . BLADDER SUSPENSION N/A 03/06/2014   Procedure: [facility] SLING ;  Surgeon: John Wrenn, MD;  Location: [facility];  Service: Urology;  Laterality: N/A;  . CARDIOVASCULAR STRESS TEST  03-27-2008   NORMAL PERFUSION STUDY/  EF 54%  . COLON SURGERY  2010   for rectal ca  . COLONOSCOPY    . EUS N/A 03/02/2017   Procedure: LOWER ENDOSCOPIC ULTRASOUND (EUS);  Surgeon: Daniel P Jacobs, MD;  Location: [facility];  Service: Endoscopy;  Laterality: N/A;  . EXCISION LOWER ABDOMINAL SCAR POST ABDOMINOPLASTY  01-05-2000  . ORIF RIGHT ANKLE FX  12-24-2008  . RIGHT ANKLE ARTHROSCOPY W/ DEBRIDEMENT AND OPEN  REMOVAL HARDWARE  03-12-2010  . TRANSTHORACIC ECHOCARDIOGRAM  08-05-2009   MILD LVH/  EF 60-65%  . TUBAL LIGATION     Family History  Problem Relation Age of Onset  . Diabetes Mother   . Hypertension Mother   . Diabetes Sister   . Hypertension Sister   . Diabetes Brother   . Hypertension Brother   . Lupus Daughter   . Colon cancer Neg Hx   . Esophageal cancer Neg Hx   . Rectal cancer Neg Hx   . Stomach cancer Neg Hx    Social History  Substance Use Topics  . Smoking status: Former Smoker    Years: 20.00    Types: Cigarettes    Quit date: 11/14/2006  . Smokeless tobacco: Never Used  . Alcohol use No   OB History    No data available     Review of Systems  Constitutional: Negative.  Negative for fatigue and fever.  HENT: Negative.   Eyes: Negative.  Negative for visual disturbance.  Respiratory: Negative.  Negative for cough and shortness of breath.   Cardiovascular: Negative.  Negative for chest pain and leg swelling.  Gastrointestinal: Negative.  Negative for abdominal distention, abdominal pain, nausea and vomiting.  Endocrine: Negative.   Genitourinary: Positive for frequency, hematuria and urgency. Negative for vaginal discharge.       Some vaginal itching.   Musculoskeletal: Negative.  Negative for gait problem and   neck stiffness.  Skin: Negative.   Allergic/Immunologic: Negative.   Neurological: Negative.  Negative for dizziness and headaches.  Hematological: Negative.   Psychiatric/Behavioral: Negative.     Allergies  Patient has no known allergies.  Home Medications   Prior to Admission medications   Medication Sig Start Date End Date Taking? Authorizing Provider  amLODipine (NORVASC) 10 MG tablet Take 1 tablet (10 mg total) by mouth daily. 11/11/16  Yes Ivar Drape D, PA  aspirin 81 MG tablet Take 81 mg by mouth daily.   Yes [provider]  atorvastatin (LIPITOR) 10 MG tablet Take 1 tablet (10 mg total) by mouth every morning. 11/11/16   Yes English, Colletta Maryland D, PA  glucose blood (ACCU-CHEK SMARTVIEW) test strip PATIENT NEEDS OFFICE VISIT FOR ADDITIONAL REFILLS - very overdue for DM check up. 11/11/15  Yes English, Colletta Maryland D, PA  insulin degludec (TRESIBA FLEXTOUCH) 100 UNIT/ML SOPN FlexTouch Pen Inject 0.15 mLs (15 Units total) into the skin at bedtime. 11/11/16  Yes English, Colletta Maryland D, PA  lisinopril (PRINIVIL,ZESTRIL) 10 MG tablet Take 1 tablet (10 mg total) by mouth every morning. 11/11/16  Yes Ivar Drape D, PA  metFORMIN (GLUCOPHAGE) 1000 MG tablet Take 1 tablet (1,000 mg total) by mouth 2 (two) times daily with a meal. 11/11/16  Yes English, Stephanie D, PA  cephALEXin (KEFLEX) 500 MG capsule Take 1 capsule (500 mg total) by mouth 3 (three) times daily. 04/09/17   Nehemiah Settle, NP  fluconazole (DIFLUCAN) 200 MG tablet Take one 200 mg tablet on day one and the second 200 mg tablet on day 3. 04/09/17   Nehemiah Settle, NP  miconazole (MONISTAT 1 COMBO PACK) kit Place 1 each vaginally once. 04/09/17 04/09/17  Nehemiah Settle, NP  Probiotic Product (ALIGN) 4 MG CAPS Take 1 capsule (4 mg total) by mouth daily. 04/09/17   Nehemiah Settle, NP   Meds Ordered and Administered this Visit  Medications - No data to display  BP 122/71 (BP Location: Left Arm)   Pulse 82   Temp 98.2 F (36.8 C) (Oral)   Resp 16   SpO2 98%  No data found.   Physical Exam  Constitutional: She appears well-developed and well-nourished. No distress.  Cardiovascular: Normal rate, regular rhythm, normal heart sounds and intact distal pulses.  Exam reveals no gallop and no friction rub.   No murmur heard. Pulmonary/Chest: Effort normal and breath sounds normal. No respiratory distress. She has no wheezes. She has no rales. She exhibits no tenderness.  Abdominal: Soft. Bowel sounds are normal. She exhibits no distension and no mass. There is no hepatosplenomegaly. There is tenderness in the right lower quadrant and periumbilical  area. There is tenderness at McBurney's point. There is no rigidity, no rebound, no guarding and no CVA tenderness.  Patient is missing her umbilicus and has a transverse surgical scar related to a tummy tuck.  Skin: Skin is warm and dry. No rash noted. She is not diaphoretic.  Nursing note and vitals reviewed.   Urgent Care Course     Procedures (including critical care time)  Labs Review Labs Reviewed  POCT URINALYSIS DIP (DEVICE) - Abnormal; Notable for the following:       Result Value   Bilirubin Urine SMALL (*)    Ketones, ur TRACE (*)    Hgb urine dipstick MODERATE (*)    Protein, ur 30 (*)    Nitrite POSITIVE (*)    Leukocytes, UA LARGE (*)    All  other components within normal limits   Results for orders placed or performed during the hospital encounter of 04/09/17  POCT urinalysis dip (device)  Result Value Ref Range   Glucose, UA NEGATIVE NEGATIVE mg/dL   Bilirubin Urine SMALL (A) NEGATIVE   Ketones, ur TRACE (A) NEGATIVE mg/dL   Specific Gravity, Urine >=1.030 1.005 - 1.030   Hgb urine dipstick MODERATE (A) NEGATIVE   pH 5.0 5.0 - 8.0   Protein, ur 30 (A) NEGATIVE mg/dL   Urobilinogen, UA 0.2 0.0 - 1.0 mg/dL   Nitrite POSITIVE (A) NEGATIVE   Leukocytes, UA LARGE (A) NEGATIVE     Imaging Review No results found.      MDM   1. Acute cystitis with hematuria    Meds ordered this encounter  Medications  . fluconazole (DIFLUCAN) 200 MG tablet    Sig: Take one 200 mg tablet on day one and the second 200 mg tablet on day 3.    Dispense:  2 tablet    Refill:  0  . miconazole (MONISTAT 1 COMBO PACK) kit    Sig: Place 1 each vaginally once.    Dispense:  1 each    Refill:  0  . Probiotic Product (ALIGN) 4 MG CAPS    Sig: Take 1 capsule (4 mg total) by mouth daily.    Dispense:  30 capsule    Refill:  2  . cephALEXin (KEFLEX) 500 MG capsule    Sig: Take 1 capsule (500 mg total) by mouth 3 (three) times daily.    Dispense:  30 capsule    Refill:  0    Discussed pulse therapy with Diflucan. In addition, discussed the importance of probiotics following antibiotics for the prevention of further yeast infections. The usual and customary discharge instructions and warnings were given.  The patient verbalizes understanding and agrees to plan of care.       ,  H, NP 04/09/17 1342  

## 2017-04-09 NOTE — Discharge Instructions (Signed)
Eat yogurt and take a probiotic daily to help prevent any worsening of yeast infection.

## 2017-04-09 NOTE — ED Triage Notes (Signed)
Pt here for UTI sx onset 3 days associated w/dysuria, urinary urgency, vag itching, abd pain, nausea  Reports she just finished antibiotics and believes she may have developed a yeast  Denies fevers, chills  Taking OTC Azo w/no relief.   A&O x4... NAD... Ambulatory

## 2017-04-14 NOTE — Addendum Note (Signed)
Addendum  created 04/14/17 1400 by Rica Koyanagi, MD   Sign clinical note

## 2017-09-06 ENCOUNTER — Telehealth: Payer: Self-pay | Admitting: Family Medicine

## 2017-09-08 NOTE — Telephone Encounter (Signed)
Advised pt that she needs OV.

## 2017-11-06 ENCOUNTER — Encounter: Payer: BC Managed Care – PPO | Admitting: Physician Assistant

## 2017-11-13 ENCOUNTER — Ambulatory Visit: Payer: BC Managed Care – PPO | Admitting: Physician Assistant

## 2017-11-13 ENCOUNTER — Other Ambulatory Visit: Payer: Self-pay

## 2017-11-13 ENCOUNTER — Encounter: Payer: Self-pay | Admitting: Physician Assistant

## 2017-11-13 VITALS — BP 130/68 | HR 87 | Temp 97.6°F | Resp 16 | Ht <= 58 in | Wt 130.0 lb

## 2017-11-13 DIAGNOSIS — Z0000 Encounter for general adult medical examination without abnormal findings: Secondary | ICD-10-CM

## 2017-11-13 DIAGNOSIS — Z1329 Encounter for screening for other suspected endocrine disorder: Secondary | ICD-10-CM

## 2017-11-13 DIAGNOSIS — R21 Rash and other nonspecific skin eruption: Secondary | ICD-10-CM

## 2017-11-13 DIAGNOSIS — Z794 Long term (current) use of insulin: Secondary | ICD-10-CM

## 2017-11-13 DIAGNOSIS — E119 Type 2 diabetes mellitus without complications: Secondary | ICD-10-CM

## 2017-11-13 DIAGNOSIS — Z13228 Encounter for screening for other metabolic disorders: Secondary | ICD-10-CM | POA: Diagnosis not present

## 2017-11-13 DIAGNOSIS — I10 Essential (primary) hypertension: Secondary | ICD-10-CM

## 2017-11-13 DIAGNOSIS — Z13220 Encounter for screening for lipoid disorders: Secondary | ICD-10-CM

## 2017-11-13 DIAGNOSIS — E7800 Pure hypercholesterolemia, unspecified: Secondary | ICD-10-CM

## 2017-11-13 DIAGNOSIS — IMO0001 Reserved for inherently not codable concepts without codable children: Secondary | ICD-10-CM

## 2017-11-13 MED ORDER — AMLODIPINE BESYLATE 10 MG PO TABS
10.0000 mg | ORAL_TABLET | Freq: Every day | ORAL | 3 refills | Status: DC
Start: 2017-11-13 — End: 2018-11-02

## 2017-11-13 MED ORDER — LISINOPRIL 10 MG PO TABS
10.0000 mg | ORAL_TABLET | Freq: Every morning | ORAL | 3 refills | Status: DC
Start: 2017-11-13 — End: 2018-11-02

## 2017-11-13 MED ORDER — TRIAMCINOLONE ACETONIDE 0.025 % EX CREA: 1.0000 "application " | TOPICAL_CREAM | Freq: Two times a day (BID) | CUTANEOUS | 1 refills | Status: DC

## 2017-11-13 MED ORDER — INSULIN DEGLUDEC 100 UNIT/ML ~~LOC~~ SOPN: 15.0000 [IU] | PEN_INJECTOR | Freq: Every day | SUBCUTANEOUS | 1 refills | Status: DC

## 2017-11-13 MED ORDER — ATORVASTATIN CALCIUM 10 MG PO TABS: 10.0000 mg | ORAL_TABLET | Freq: Every morning | ORAL | 3 refills | Status: DC

## 2017-11-13 MED ORDER — BETAMETHASONE DIPROPIONATE 0.05 % EX CREA: TOPICAL_CREAM | Freq: Two times a day (BID) | CUTANEOUS | 1 refills | Status: DC

## 2017-11-13 MED ORDER — METFORMIN HCL 1000 MG PO TABS: 1000.0000 mg | ORAL_TABLET | Freq: Two times a day (BID) | ORAL | 3 refills | Status: DC

## 2017-11-13 NOTE — Patient Instructions (Signed)
     IF you received an x-ray today, you will receive an invoice from [facility]. Please contact [facility] at [phone] with questions or concerns regarding your invoice.   IF you received labwork today, you will receive an invoice from [facility]. Please contact [facility] at [phone] with questions or concerns regarding your invoice.   Our billing staff will not be able to assist you with questions regarding bills from these companies.  You will be contacted with the lab results as soon as they are available. The fastest way to get your results is to activate your My Chart account. Instructions are located on the last page of this paperwork. If you have not heard from us regarding the results in 2 weeks, please contact this office.     

## 2017-11-13 NOTE — Progress Notes (Signed)
PRIMARY CARE AT [place], [place] [address] [phone]  Date:  11/13/2017   Name:  Gabrielle [name]   DOB:  August 31, 1956   MRN:  [id number]  PCP:  Joretta Bachelor, PA    History of Present Illness:  Gabrielle [name] is a 61 y.o. female patient who presents to PCP with  Chief Complaint  Patient presents with  . Annual Exam  . Medication Refill    all meds      Gabrielle [name] is a 61 y.o. female patient who presents to [facility] for annual physical exam.     Diet:  Water intake is 2 bottles.  Small portions.  Unsweetened tea.  She generally has grits and sausage patty.  Eats vegetable beef soup.  Dinner varies from chicken (grilled), roast beef.  She has 2 vegetables (green), and one starch.    BM: normal.  No blood.  No constipation or diarrhea  Urination: normal, but does frequency. No hematuria or dysuria.    Sleep:  Sleep is fine  Social Activity:  Exercising: none.  She started after leaving for the last annual but did not continue.   She had moved, and loves her home.   EtOH: none Illicit drug use: none Tobacco or vaping: none opthalmologist 3 years ago.  No change in vision that she knows of.  Refill of medication: No excessive thirst, nausea No chest pains, palpitiatons, or sob.  Leg swelling.  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  Sedentary position.  She likes it.   Her blood sugar is running around 180.  She is using 15 units of the tresiba.  She is also compliant with the metformin.  She has noted polyuria.  No dizziness or vision changes.  No diarrhea.  No paresthesia of the extremities.  She checks her blood sugar at nighttime just prior to bed.  She does not do morning fast.  Her checks are about 4 hours after dinner meal.  She does not check her blood pressure at home. No chest pains, palpitations, or sob.      Patient Active Problem List   Diagnosis Date Noted  . Rectal carcinoid tumor   . Sepsis  due to urinary tract infection ([place]) 02/02/2015  . Essential hypertension 02/02/2015  . Atherosclerosis of native arteries of extremity with intermittent claudication ([place]) 02/02/2015  . Sepsis ([place]) 02/02/2015  . Stress incontinence in female 03/06/2014  . 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 Diabetes mellitus ([place]) 02/28/2012  . High cholesterol 02/28/2012  . Hypertension 02/28/2012    Past Medical History:  Diagnosis Date  . Cancer ([place])    rectal CA  . Cataract   . Eczema   . Hyperlipidemia   . Hypertension   . Intrinsic (urethral) sphincter deficiency (ISD)   . RBBB   . SUI (stress urinary incontinence, female)    hx of  . Type 2 diabetes mellitus ([place])   . Wears dentures    UPPER  . Wears glasses     Past Surgical History:  Procedure Laterality Date  . ABDOMINAL HYSTERECTOMY  1998   fibroids, partial  . ABDOMINOPLASTY  1999  . BLADDER SUSPENSION N/A 03/06/2014   Procedure: [facility] ;  Surgeon: Irine Seal, MD;  Location: [facility];  Service: Urology;  Laterality: N/A;  . CARDIOVASCULAR STRESS TEST  03-27-2008   NORMAL PERFUSION STUDY/  EF 54%  . COLON SURGERY  2010  for rectal ca  . COLONOSCOPY    . EUS N/A 03/02/2017   Procedure: LOWER ENDOSCOPIC ULTRASOUND (EUS);  Surgeon: Milus Banister, MD;  Location: [facility];  Service: Endoscopy;  Laterality: N/A;  . EXCISION LOWER ABDOMINAL SCAR POST ABDOMINOPLASTY  01-05-2000  . ORIF RIGHT ANKLE FX  12-24-2008  . RIGHT ANKLE ARTHROSCOPY W/ DEBRIDEMENT AND OPEN REMOVAL HARDWARE  03-12-2010  . TRANSTHORACIC ECHOCARDIOGRAM  08-05-2009   MILD LVH/  EF 60-65%  . TUBAL LIGATION      Social History   Tobacco Use  . Smoking status: Former Smoker    Years: 20.00    Types: Cigarettes    Last attempt to quit: 11/14/2006    Years since quitting: 11.0  . Smokeless tobacco: Never Used  Substance Use Topics  . Alcohol use: No  . Drug use: No    Family History  Problem Relation Age of Onset   . Diabetes Mother   . Hypertension Mother   . Diabetes Sister   . Hypertension Sister   . Diabetes Brother   . Hypertension Brother   . Lupus Daughter   . Colon cancer Neg Hx   . Esophageal cancer Neg Hx   . Rectal cancer Neg Hx   . Stomach cancer Neg Hx     No Known Allergies  Medication list has been reviewed and updated.  Current Outpatient Medications on File Prior to Visit  Medication Sig Dispense Refill  . amLODipine (NORVASC) 10 MG tablet Take 1 tablet (10 mg total) by mouth daily. 90 tablet 3  . aspirin 81 MG tablet Take 81 mg by mouth daily.    Marland Kitchen atorvastatin (LIPITOR) 10 MG tablet Take 1 tablet (10 mg total) by mouth every morning. 90 tablet 3  . glucose blood (ACCU-CHEK SMARTVIEW) test strip PATIENT NEEDS OFFICE VISIT FOR ADDITIONAL REFILLS - very overdue for DM check up. 100 each 0  . insulin degludec (TRESIBA FLEXTOUCH) 100 UNIT/ML SOPN FlexTouch Pen Inject 0.15 mLs (15 Units total) into the skin at bedtime. 3 mL 5  . lisinopril (PRINIVIL,ZESTRIL) 10 MG tablet Take 1 tablet (10 mg total) by mouth every morning. 90 tablet 3  . metFORMIN (GLUCOPHAGE) 1000 MG tablet Take 1 tablet (1,000 mg total) by mouth 2 (two) times daily with a meal. 180 tablet 3  . Probiotic Product (ALIGN) 4 MG CAPS Take 1 capsule (4 mg total) by mouth daily. 30 capsule 2  . cephALEXin (KEFLEX) 500 MG capsule Take 1 capsule (500 mg total) by mouth 3 (three) times daily. (Patient not taking: Reported on 11/13/2017) 30 capsule 0  . fluconazole (DIFLUCAN) 200 MG tablet Take one 200 mg tablet on day one and the second 200 mg tablet on day 3. (Patient not taking: Reported on 11/13/2017) 2 tablet 0   Current Facility-Administered Medications on File Prior to Visit  Medication Dose Route Frequency Provider Last Rate Last Dose  . 0.9 %  sodium chloride infusion  500 mL Intravenous Continuous Armbruster, Carlota Raspberry, MD        ROS ROS otherwise unremarkable unless listed above.  Physical Examination: BP  130/68   Pulse 87   Temp 97.6 F (36.4 C) (Oral)   Resp 16   Ht 4' 8"  (1.422 m)   Wt 130 lb (59 kg)   SpO2 95%   BMI 29.15 kg/m  Ideal Body Weight: Weight in (lb) to have BMI = 25: 111.3  Physical Exam  Constitutional: She is oriented to person, place, and time.  She appears well-developed and well-nourished. No distress.  HENT:  Head: Normocephalic and atraumatic.  Right Ear: Tympanic membrane, external ear and ear canal normal.  Left Ear: Tympanic membrane, external ear and ear canal normal.  Nose: Right sinus exhibits no maxillary sinus tenderness and no frontal sinus tenderness. Left sinus exhibits no maxillary sinus tenderness and no frontal sinus tenderness.  Mouth/Throat: Oropharynx is clear and moist. No uvula swelling. No oropharyngeal exudate, posterior oropharyngeal edema or posterior oropharyngeal erythema.  Eyes: Conjunctivae and EOM are normal. Pupils are equal, round, and reactive to light.  Neck: Normal range of motion. Neck supple. No thyromegaly present.  Cardiovascular: Normal rate, regular rhythm, normal heart sounds and intact distal pulses. Exam reveals no gallop, no distant heart sounds and no friction rub.  No murmur heard. Pulmonary/Chest: Effort normal and breath sounds normal. No respiratory distress. She has no decreased breath sounds. She has no wheezes. She has no rhonchi.  Abdominal: Soft. Bowel sounds are normal. She exhibits no distension and no mass. There is no tenderness.  Musculoskeletal: Normal range of motion. She exhibits no edema or tenderness.  Lymphadenopathy:       Head (right side): No submandibular, no tonsillar, no preauricular and no posterior auricular adenopathy present.       Head (left side): No submandibular, no tonsillar, no preauricular and no posterior auricular adenopathy present.    She has no cervical adenopathy.  Neurological: She is alert and oriented to person, place, and time. No cranial nerve deficit. She exhibits normal  muscle tone. Coordination normal.  Skin: Skin is warm and dry. Rash (dry hyperpigmented thickened skin along extremity in areas, posterior cervical) noted. She is not diaphoretic.  Psychiatric: She has a normal mood and affect. Her behavior is normal.     Assessment and Plan: ARLINE KETTER is a 61 y.o. female who is here today for cc of  Chief Complaint  Patient presents with  . Annual Exam  . Medication Refill    all meds   Reviewed history, rectal carcinoid found in 2 occurrences. Last follow up 03/02/2017, with recommendation of reflex sig in 1 year. Will await that contact for potential appt 02/2018.   Refilled metformin for 1 year tresiba refilled for 6 months.  Advised to return for 3 month follow up.   Annual physical exam - Plan: lisinopril (PRINIVIL,ZESTRIL) 10 MG tablet, amLODipine (NORVASC) 10 MG tablet, metFORMIN (GLUCOPHAGE) 1000 MG tablet, insulin degludec (TRESIBA FLEXTOUCH) 100 UNIT/ML SOPN FlexTouch Pen, atorvastatin (LIPITOR) 10 MG tablet  Screening for metabolic disorder - Plan: CMP14+EGFR  Screening for thyroid disorder - Plan: TSH  Screening for lipid disorders - Plan: Lipid panel  Type 2 diabetes mellitus without complication, with long-term current use of insulin (HCC) - Plan: Hemoglobin A1c, Microalbumin, urine  Essential hypertension - Plan: lisinopril (PRINIVIL,ZESTRIL) 10 MG tablet, amLODipine (NORVASC) 10 MG tablet  IDDM (insulin dependent diabetes mellitus) ([place]) - Plan: metFORMIN (GLUCOPHAGE) 1000 MG tablet, insulin degludec (TRESIBA FLEXTOUCH) 100 UNIT/ML SOPN FlexTouch Pen  Pure hypercholesterolemia - Plan: atorvastatin (LIPITOR) 10 MG tablet  Rash and nonspecific skin eruption - Plan: triamcinolone (KENALOG) 0.025 % cream, betamethasone dipropionate (DIPROLENE) 0.05 % cream  Ivar Drape, PA-C [facility] 12/31/20182:35 PM

## 2017-11-14 LAB — CMP14+EGFR
ALBUMIN: 4.6 g/dL (ref 3.6–4.8)
ALT: 16 IU/L (ref 0–32)
AST: 23 IU/L (ref 0–40)
Albumin/Globulin Ratio: 1.4 (ref 1.2–2.2)
Alkaline Phosphatase: 110 IU/L (ref 39–117)
BILIRUBIN TOTAL: 0.5 mg/dL (ref 0.0–1.2)
BUN / CREAT RATIO: 14 (ref 12–28)
BUN: 10 mg/dL (ref 8–27)
CHLORIDE: 103 mmol/L (ref 96–106)
CO2: 18 mmol/L — ABNORMAL LOW (ref 20–29)
Calcium: 10 mg/dL (ref 8.7–10.3)
Creatinine, Ser: 0.72 mg/dL (ref 0.57–1.00)
GFR calc non Af Amer: 91 mL/min/{1.73_m2} (ref >59)
GFR, EST AFRICAN AMERICAN: 105 mL/min/{1.73_m2} (ref >59)
GLUCOSE: 167 mg/dL — AB (ref 65–99)
Globulin, Total: 3.2 g/dL (ref 1.5–4.5)
Potassium: 4.7 mmol/L (ref 3.5–5.2)
Sodium: 141 mmol/L (ref 134–144)
TOTAL PROTEIN: 7.8 g/dL (ref 6.0–8.5)

## 2017-11-14 LAB — LIPID PANEL
CHOL/HDL RATIO: 5.6 ratio — AB (ref 0.0–4.4)
Cholesterol, Total: 192 mg/dL (ref 100–199)
HDL: 34 mg/dL — AB (ref >39)
LDL CALC: 132 mg/dL — AB (ref 0–99)
Triglycerides: 128 mg/dL (ref 0–149)
VLDL CHOLESTEROL CAL: 26 mg/dL (ref 5–40)

## 2017-11-14 LAB — HEMOGLOBIN A1C
Est. average glucose Bld gHb Est-mCnc: 263 mg/dL
Hgb A1c MFr Bld: 10.8 % — ABNORMAL HIGH (ref 4.8–5.6)

## 2017-11-14 LAB — TSH: TSH: 1.02 u[IU]/mL (ref 0.450–4.500)

## 2017-11-14 LAB — MICROALBUMIN, URINE: MICROALBUM., U, RANDOM: 39.6 ug/mL

## 2017-11-15 ENCOUNTER — Other Ambulatory Visit: Payer: Self-pay | Admitting: Physician Assistant

## 2017-11-15 DIAGNOSIS — Z794 Long term (current) use of insulin: Principal | ICD-10-CM

## 2017-11-15 DIAGNOSIS — E119 Type 2 diabetes mellitus without complications: Secondary | ICD-10-CM

## 2017-11-15 NOTE — Telephone Encounter (Signed)
Copied from [place]. Topic: Quick Communication - See Telephone Encounter >> Nov 15, 2017  9:14 AM Ether Griffins B wrote: CRM for notification. See Telephone encounter for:  Gabrielle [name] is out of pts needles and needs the script transferred to [facility] on [address]  11/15/17.

## 2017-11-16 NOTE — Telephone Encounter (Signed)
Copied from [place]. Topic: Quick Communication - See Telephone Encounter >> Nov 16, 2017 11:46 AM Percell Belt A wrote: Pt called in and does not need the tresiba flextouch,, she just needs her needles that she screws onto the pens.

## 2017-11-16 NOTE — Telephone Encounter (Signed)
They were ordered by Almyra Free.  Please contact the pharmacy and confirm these were ordered.  Also please advise patient if this was the case.  I never requested the medication.  Only the needles.

## 2017-11-16 NOTE — Telephone Encounter (Signed)
Order pended for provider signature.

## 2017-11-16 NOTE — Telephone Encounter (Signed)
Spoke with patient tresiba flextouch is being filled at [address].  Will be ready for pickup at after 2;00

## 2017-11-17 NOTE — Telephone Encounter (Signed)
I called the pharmacy, [facility], and was told the script that was sent over, the patient's insurance does not cover that brand. They will need a new prescription stating:  BD Brand Ultrafine Pen Needles Size 51mm 32 gauge.  The insurance covers this brand. Thanks.

## 2017-11-20 NOTE — Telephone Encounter (Signed)
Patient's insurance does not cover Novofine Plus needles, but will pay for BD brand ultra fine needle 6 mm 32 g. New Rx is needed for the brand, please advise. thanks

## 2017-11-21 MED ORDER — INSULIN PEN NEEDLE 32G X 6 MM MISC: 1.0000 | Freq: Every day | 3 refills | Status: DC

## 2017-11-21 NOTE — Telephone Encounter (Signed)
Please have these ordered with pharmacy.  Whatever her insurance will cover which is this.  It will not pull up, so I suppose it would need a phone call order.

## 2017-11-21 NOTE — Telephone Encounter (Signed)
Prescription for insulin pen needles sent to [facility] on [address] per conversation with Colletta Maryland English.   Phone call to patient. She states she has already received pen needles for [place].   Phone call to [place]. Prescription cancelled. Closing note.

## 2018-01-02 ENCOUNTER — Ambulatory Visit (HOSPITAL_COMMUNITY)
Admission: EM | Admit: 2018-01-02 | Discharge: 2018-01-02 | Disposition: A | Discharge status: Home / Self Care | Payer: BC Managed Care – PPO | Attending: Internal Medicine | Admitting: Internal Medicine

## 2018-01-02 ENCOUNTER — Encounter (HOSPITAL_COMMUNITY): Payer: Self-pay | Admitting: Family Medicine

## 2018-01-02 DIAGNOSIS — R110 Nausea: Secondary | ICD-10-CM

## 2018-01-02 DIAGNOSIS — R51 Headache: Secondary | ICD-10-CM

## 2018-01-02 DIAGNOSIS — R519 Headache, unspecified: Secondary | ICD-10-CM

## 2018-01-02 MED ORDER — KETOROLAC TROMETHAMINE 30 MG/ML IJ SOLN
30.0000 mg | Freq: Once | INTRAMUSCULAR | Status: AC
  Administered 2018-01-02: 30 mg via INTRAMUSCULAR

## 2018-01-02 MED ORDER — ONDANSETRON HCL 4 MG PO TABS: 4.0000 mg | ORAL_TABLET | Freq: Three times a day (TID) | ORAL | 0 refills | Status: DC | PRN

## 2018-01-02 MED ORDER — KETOROLAC TROMETHAMINE 30 MG/ML IJ SOLN
INTRAMUSCULAR | Status: AC
  Filled 2018-01-02: qty 1

## 2018-01-02 MED ORDER — ONDANSETRON 4 MG PO TBDP
4.0000 mg | ORAL_TABLET | Freq: Once | ORAL | Status: AC
  Administered 2018-01-02: 4 mg via ORAL

## 2018-01-02 MED ORDER — ONDANSETRON 4 MG PO TBDP
ORAL_TABLET | ORAL | Status: AC
  Filled 2018-01-02: qty 1

## 2018-01-02 NOTE — ED Triage Notes (Signed)
Pt here for nausea that started Sunday and today she began to have the aches and the chills.

## 2018-01-02 NOTE — Discharge Instructions (Signed)
Increase fluid intake to ensure adequate hydration even if low appetite for food. [place] diet as tolerated. Tylenol and/or ibuprofen as needed for pain or fevers.   Rest. Zofran as needed for nausea. If symptoms worsen or do not improve in the next week to return to be seen or to follow up with your PCP.

## 2018-01-02 NOTE — ED Provider Notes (Signed)
Gabrielle [name]    CSN: [id number] Arrival date & time: 01/02/18  1610     History   Chief Complaint Chief Complaint  Patient presents with  . Nausea    HPI Gabrielle [name] is a 62 y.o. female.   [place] presents with complaints of headache, body aches and nausea which started two days ago. She states she was around her mother who was also ill. Occasional cough. Without fevers, runny nose, congestion, ear pain. She has been drinking ginger ale and vitamin c. Denies abdominal pain or diarrhea. Has not vomited. Normal BM's. Denies any previous similar. Without skin rash. History of dm, htn, rectal cancer    ROS per HPI.       Past Medical History:  Diagnosis Date  . Cancer ([place])    rectal CA  . Cataract   . Eczema   . Hyperlipidemia   . Hypertension   . Intrinsic (urethral) sphincter deficiency (ISD)   . RBBB   . SUI (stress urinary incontinence, female)    hx of  . Type 2 diabetes mellitus ([place])   . Wears dentures    UPPER  . Wears glasses     Patient Active Problem List   Diagnosis Date Noted  . Rectal carcinoid tumor   . Sepsis due to urinary tract infection ([place]) 02/02/2015  . Essential hypertension 02/02/2015  . Atherosclerosis of native arteries of extremity with intermittent claudication ([place]) 02/02/2015  . Sepsis ([place]) 02/02/2015  . Stress incontinence in female 03/06/2014  . 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 Diabetes mellitus ([place]) 02/28/2012  . High cholesterol 02/28/2012  . Hypertension 02/28/2012    Past Surgical History:  Procedure Laterality Date  . ABDOMINAL HYSTERECTOMY  1998   fibroids, partial  . ABDOMINOPLASTY  1999  . BLADDER SUSPENSION N/A 03/06/2014   Procedure: [facility] ;  Surgeon: Irine Seal, MD;  Location: [facility];  Service: Urology;  Laterality: N/A;  . CARDIOVASCULAR STRESS TEST  03-27-2008   NORMAL PERFUSION STUDY/  EF 54%  . COLON SURGERY  2010   for rectal ca  . COLONOSCOPY      . EUS N/A 03/02/2017   Procedure: LOWER ENDOSCOPIC ULTRASOUND (EUS);  Surgeon: Milus Banister, MD;  Location: [facility];  Service: Endoscopy;  Laterality: N/A;  . EXCISION LOWER ABDOMINAL SCAR POST ABDOMINOPLASTY  01-05-2000  . ORIF RIGHT ANKLE FX  12-24-2008  . RIGHT ANKLE ARTHROSCOPY W/ DEBRIDEMENT AND OPEN REMOVAL HARDWARE  03-12-2010  . TRANSTHORACIC ECHOCARDIOGRAM  08-05-2009   MILD LVH/  EF 60-65%  . TUBAL LIGATION      OB History    No data available       Home Medications    Prior to Admission medications   Medication Sig Start Date End Date Taking? Authorizing Provider  amLODipine (NORVASC) 10 MG tablet Take 1 tablet (10 mg total) by mouth daily. 11/13/17   Ivar Drape D, PA  aspirin 81 MG tablet Take 81 mg by mouth daily.    [provider]  atorvastatin (LIPITOR) 10 MG tablet Take 1 tablet (10 mg total) by mouth every morning. 11/13/17   Ivar Drape D, PA  betamethasone dipropionate (DIPROLENE) 0.05 % cream Apply topically 2 (two) times daily. 11/13/17   Ivar Drape D, PA  cephALEXin (KEFLEX) 500 MG capsule Take 1 capsule (500 mg total) by mouth 3 (three) times daily. Patient not taking: Reported on 11/13/2017 04/09/17   Nehemiah Settle, NP  fluconazole (DIFLUCAN) 200  MG tablet Take one 200 mg tablet on day one and the second 200 mg tablet on day 3. Patient not taking: Reported on 11/13/2017 04/09/17   Nehemiah Settle, NP  glucose blood (ACCU-CHEK SMARTVIEW) test strip PATIENT NEEDS OFFICE VISIT FOR ADDITIONAL REFILLS - very overdue for DM check up. 11/11/15   Ivar Drape D, PA  insulin degludec (TRESIBA FLEXTOUCH) 100 UNIT/ML SOPN FlexTouch Pen Inject 0.15 mLs (15 Units total) into the skin at bedtime. 11/13/17   Ivar Drape D, PA  lisinopril (PRINIVIL,ZESTRIL) 10 MG tablet Take 1 tablet (10 mg total) by mouth every morning. 11/13/17   Ivar Drape D, PA  metFORMIN (GLUCOPHAGE) 1000 MG tablet Take 1 tablet (1,000 mg  total) by mouth 2 (two) times daily with a meal. 11/13/17   English, Colletta Maryland D, PA  ondansetron (ZOFRAN) 4 MG tablet Take 1 tablet (4 mg total) by mouth every 8 (eight) hours as needed for nausea or vomiting. 01/02/18   Zigmund Gottron, NP  Probiotic Product (ALIGN) 4 MG CAPS Take 1 capsule (4 mg total) by mouth daily. 04/09/17   Nehemiah Settle, NP  triamcinolone (KENALOG) 0.025 % cream Apply 1 application topically 2 (two) times daily. 11/13/17   Joretta Bachelor, PA    Family History Family History  Problem Relation Age of Onset  . Diabetes Mother   . Hypertension Mother   . Diabetes Sister   . Hypertension Sister   . Diabetes Brother   . Hypertension Brother   . Lupus Daughter   . Colon cancer Neg Hx   . Esophageal cancer Neg Hx   . Rectal cancer Neg Hx   . Stomach cancer Neg Hx     Social History Social History   Tobacco Use  . Smoking status: Former Smoker    Years: 20.00    Types: Cigarettes    Last attempt to quit: 11/14/2006    Years since quitting: 11.1  . Smokeless tobacco: Never Used  Substance Use Topics  . Alcohol use: No  . Drug use: No     Allergies   Patient has no known allergies.   Review of Systems Review of Systems   Physical Exam Triage Vital Signs ED Triage Vitals  Enc Vitals Group     BP 01/02/18 1723 (!) 154/71     Pulse Rate 01/02/18 1723 79     Resp 01/02/18 1723 18     Temp 01/02/18 1723 98.3 F (36.8 C)     Temp src --      SpO2 01/02/18 1723 99 %     Weight --      Height --      Head Circumference --      Peak Flow --      Pain Score 01/02/18 1722 8     Pain Loc --      Pain Edu? --      Excl. in [place]? --    No data found.  Updated Vital Signs BP (!) 154/71   Pulse 79   Temp 98.3 F (36.8 C)   Resp 18   SpO2 99%   Visual Acuity Right Eye Distance:   Left Eye Distance:   Bilateral Distance:    Right Eye Near:   Left Eye Near:    Bilateral Near:     Physical Exam  Constitutional: She is oriented to  person, place, and time. She appears well-developed and well-nourished. No distress.  HENT:  Head: Normocephalic and atraumatic.  Right Ear: Tympanic membrane, external ear and ear canal normal.  Left Ear: Tympanic membrane, external ear and ear canal normal.  Nose: Nose normal.  Mouth/Throat: Uvula is midline, oropharynx is clear and moist and mucous membranes are normal. No tonsillar exudate.  Eyes: Conjunctivae and EOM are normal. Pupils are equal, round, and reactive to light.  Cardiovascular: Normal rate, regular rhythm and normal heart sounds.  Pulmonary/Chest: Effort normal and breath sounds normal.  Abdominal: Soft. She exhibits no distension and no mass. There is no tenderness. There is no guarding.  Neurological: She is alert and oriented to person, place, and time. No cranial nerve deficit.  Skin: Skin is warm and dry.  Vitals reviewed.    UC Treatments / Results  Labs (all labs ordered are listed, but only abnormal results are displayed) Labs Reviewed - No data to display  EKG  EKG Interpretation None       Radiology No results found.  Procedures Procedures (including critical care time)  Medications Ordered in UC Medications  ondansetron (ZOFRAN-ODT) disintegrating tablet 4 mg (not administered)  ketorolac (TORADOL) 30 MG/ML injection 30 mg (not administered)     Initial Impression / Assessment and Plan / UC Course  I have reviewed the triage vital signs and the nursing notes.  Pertinent labs & imaging results that were available during my care of the patient were reviewed by me and considered in my medical decision making (see chart for details).     Non toxic in appearance, without acute abdominal findings. Symptoms not consistent with influenza at this time, tamiflu deferred. Without fever. Supportive cares for likely viral illness with nausea and headache. zofran as needed. toradol provided in clinic, may continue with tylenol or ibuprofen as needed.  Return precautions provided. If symptoms worsen or do not improve in the next week to return to be seen or to follow up with PCP.  Patient verbalized understanding and agreeable to plan.    Final Clinical Impressions(s) / UC Diagnoses   Final diagnoses:  Nausea  Acute nonintractable headache, unspecified headache type    ED Discharge Orders        Ordered    ondansetron (ZOFRAN) 4 MG tablet  Every 8 hours PRN     01/02/18 1753       Controlled Substance Prescriptions [place] Controlled Substance Registry consulted? Not Applicable   Zigmund Gottron, NP 01/02/18 1800

## 2018-02-05 ENCOUNTER — Ambulatory Visit: Payer: BC Managed Care – PPO | Admitting: Physician Assistant

## 2018-02-05 ENCOUNTER — Other Ambulatory Visit: Payer: Self-pay

## 2018-02-05 ENCOUNTER — Encounter: Payer: Self-pay | Admitting: Physician Assistant

## 2018-02-05 VITALS — BP 150/72 | HR 90 | Temp 98.6°F | Resp 16 | Ht <= 58 in | Wt 131.6 lb

## 2018-02-05 DIAGNOSIS — E119 Type 2 diabetes mellitus without complications: Secondary | ICD-10-CM

## 2018-02-05 DIAGNOSIS — Z794 Long term (current) use of insulin: Secondary | ICD-10-CM | POA: Diagnosis not present

## 2018-02-05 DIAGNOSIS — E1165 Type 2 diabetes mellitus with hyperglycemia: Secondary | ICD-10-CM | POA: Diagnosis not present

## 2018-02-05 DIAGNOSIS — IMO0001 Reserved for inherently not codable concepts without codable children: Secondary | ICD-10-CM

## 2018-02-05 LAB — POCT GLYCOSYLATED HEMOGLOBIN (HGB A1C): Hemoglobin A1C: 11

## 2018-02-05 NOTE — Patient Instructions (Addendum)
I would like you to avoid foods such as... Potatoes Breads Pastas Rices  I would like you eat fruits that end in berries  Such as strawberries and blueberries.   Please do not eat oranges, bananas, and grapes.    I am referring you to a diabetic dietician.  Please await this contact.   I would like you to increase your insulin to 20 units at bedtime.  Please check your blood sugar twice per day.  Once in the morning for breakfast.  We can figure out maybe a less expensive glucose strips.    Diabetes Mellitus and Nutrition When you have diabetes (diabetes mellitus), it is very important to have healthy eating habits because your blood sugar (glucose) levels are greatly affected by what you eat and drink. Eating healthy foods in the appropriate amounts, at about the same times every day, can help you:  Control your blood glucose.  Lower your risk of heart disease.  Improve your blood pressure.  Reach or maintain a healthy weight.  Every person with diabetes is different, and each person has different needs for a meal plan. Your health care provider may recommend that you work with a diet and nutrition specialist (dietitian) to make a meal plan that is best for you. Your meal plan may vary depending on factors such as:  The calories you need.  The medicines you take.  Your weight.  Your blood glucose, blood pressure, and cholesterol levels.  Your activity level.  Other health conditions you have, such as heart or kidney disease.  How do carbohydrates affect me? Carbohydrates affect your blood glucose level more than any other type of food. Eating carbohydrates naturally increases the amount of glucose in your blood. Carbohydrate counting is a method for keeping track of how many carbohydrates you eat. Counting carbohydrates is important to keep your blood glucose at a healthy level, especially if you use insulin or take certain oral diabetes medicines. It is important to know  how many carbohydrates you can safely have in each meal. This is different for every person. Your dietitian can help you calculate how many carbohydrates you should have at each meal and for snack. Foods that contain carbohydrates include:  Bread, cereal, rice, pasta, and crackers.  Potatoes and corn.  Peas, beans, and lentils.  Milk and yogurt.  Fruit and juice.  Desserts, such as cakes, cookies, ice cream, and candy.  How does alcohol affect me? Alcohol can cause a sudden decrease in blood glucose (hypoglycemia), especially if you use insulin or take certain oral diabetes medicines. Hypoglycemia can be a life-threatening condition. Symptoms of hypoglycemia (sleepiness, dizziness, and confusion) are similar to symptoms of having too much alcohol. If your health care provider says that alcohol is safe for you, follow these guidelines:  Limit alcohol intake to no more than 1 drink per day for nonpregnant women and 2 drinks per day for men. One drink equals 12 oz of beer, 5 oz of wine, or 1 oz of hard liquor.  Do not drink on an empty stomach.  Keep yourself hydrated with water, diet soda, or unsweetened iced tea.  Keep in mind that regular soda, juice, and other mixers may contain a lot of sugar and must be counted as carbohydrates.  What are tips for following this plan? Reading food labels  Start by checking the serving size on the label. The amount of calories, carbohydrates, fats, and other nutrients listed on the label are based on one serving of  the food. Many foods contain more than one serving per package.  Check the total grams (g) of carbohydrates in one serving. You can calculate the number of servings of carbohydrates in one serving by dividing the total carbohydrates by 15. For example, if a food has 30 g of total carbohydrates, it would be equal to 2 servings of carbohydrates.  Check the number of grams (g) of saturated and trans fats in one serving. Choose foods that  have low or no amount of these fats.  Check the number of milligrams (mg) of sodium in one serving. Most people should limit total sodium intake to less than 2,300 mg per day.  Always check the nutrition information of foods labeled as "low-fat" or "nonfat". These foods may be higher in added sugar or refined carbohydrates and should be avoided.  Talk to your dietitian to identify your daily goals for nutrients listed on the label. Shopping  Avoid buying canned, premade, or processed foods. These foods tend to be high in fat, sodium, and added sugar.  Shop around the outside edge of the grocery store. This includes fresh fruits and vegetables, bulk grains, fresh meats, and fresh dairy. Cooking  Use low-heat cooking methods, such as baking, instead of high-heat cooking methods like deep frying.  Cook using healthy oils, such as olive, canola, or sunflower oil.  Avoid cooking with butter, cream, or high-fat meats. Meal planning  Eat meals and snacks regularly, preferably at the same times every day. Avoid going long periods of time without eating.  Eat foods high in fiber, such as fresh fruits, vegetables, beans, and whole grains. Talk to your dietitian about how many servings of carbohydrates you can eat at each meal.  Eat 4-6 ounces of lean protein each day, such as lean meat, chicken, fish, eggs, or tofu. 1 ounce is equal to 1 ounce of meat, chicken, or fish, 1 egg, or 1/4 cup of tofu.  Eat some foods each day that contain healthy fats, such as avocado, nuts, seeds, and fish. Lifestyle   Check your blood glucose regularly.  Exercise at least 30 minutes 5 or more days each week, or as told by your health care provider.  Take medicines as told by your health care provider.  Do not use any products that contain nicotine or tobacco, such as cigarettes and e-cigarettes. If you need help quitting, ask your health care provider.  Work with a Social worker or diabetes educator to identify  strategies to manage stress and any emotional and social challenges. What are some questions to ask my health care provider?  Do I need to meet with a diabetes educator?  Do I need to meet with a dietitian?  What number can I call if I have questions?  When are the best times to check my blood glucose? Where to find more information:  American Diabetes Association: diabetes.org/food-and-fitness/food  Academy of Nutrition and Dietetics: PokerClues.dk  [employer] of Diabetes and Digestive and Kidney Diseases (NIH): ContactWire.be Summary  A healthy meal plan will help you control your blood glucose and maintain a healthy lifestyle.  Working with a diet and nutrition specialist (dietitian) can help you make a meal plan that is best for you.  Keep in mind that carbohydrates and alcohol have immediate effects on your blood glucose levels. It is important to count carbohydrates and to use alcohol carefully. This information is not intended to replace advice given to you by your health care provider. Make sure you discuss any questions  you have with your health care provider. Document Released: 07/28/2005 Document Revised: 12/05/2016 Document Reviewed: 12/05/2016 Elsevier Interactive Patient Education  2018 [employer].    IF you received an x-ray today, you will receive an invoice from [facility]. Please contact [facility] at [phone] with questions or concerns regarding your invoice.   IF you received labwork today, you will receive an invoice from [facility]. Please contact [facility] at [phone] with questions or concerns regarding your invoice.   Our billing staff will not be able to assist you with questions regarding bills from these companies.  You will be contacted with the lab results as soon as they are available. The  fastest way to get your results is to activate your My Chart account. Instructions are located on the last page of this paperwork. If you have not heard from [place] regarding the results in 2 weeks, please contact this office.

## 2018-02-05 NOTE — Progress Notes (Signed)
PRIMARY CARE AT [place], [place] [address] [phone]  Date:  02/05/2018   Name:  DEEKSHA COTRELL   DOB:  02/19/1956   MRN:  [id number]  PCP:  Joretta Bachelor, PA    History of Present Illness:  SHARIA AVERITT is a 62 y.o. female patient who presents to PCP with  Chief Complaint  Patient presents with  . Diabetes    follow up     She is eating bananas, meatloaf, mashed potatoes, and green peas, and rolls.   She is taking her metformin twice per day.   She is taking the insulin at night with 15 units.   She is not checking her blood sugar regularly.  When she does, it is before bed.  The range between 180-200.   She is eating snacks such as oranges or grapes, or bananas.  No desserts.  No sodas.   She is drinking orange juice once per day.  She drinks unsweetened teas with splendas.    Wt Readings from Last 3 Encounters:  02/05/18 131 lb 9.6 oz (59.7 kg)  11/13/17 130 lb (59 kg)  03/02/17 134 lb (60.8 kg)     Patient Active Problem List   Diagnosis Date Noted  . Rectal carcinoid tumor   . Sepsis due to urinary tract infection ([place]) 02/02/2015  . Essential hypertension 02/02/2015  . Atherosclerosis of native arteries of extremity with intermittent claudication ([place]) 02/02/2015  . Sepsis ([place]) 02/02/2015  . Stress incontinence in female 03/06/2014  . 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 Diabetes mellitus ([place]) 02/28/2012  . High cholesterol 02/28/2012  . Hypertension 02/28/2012    Past Medical History:  Diagnosis Date  . Cancer ([place])    rectal CA  . Cataract   . Eczema   . Hyperlipidemia   . Hypertension   . Intrinsic (urethral) sphincter deficiency (ISD)   . RBBB   . SUI (stress urinary incontinence, female)    hx of  . Type 2 diabetes mellitus ([place])   . Wears dentures    UPPER  . Wears glasses     Past Surgical History:  Procedure Laterality Date  . ABDOMINAL HYSTERECTOMY  1998   fibroids, partial  . ABDOMINOPLASTY   1999  . BLADDER SUSPENSION N/A 03/06/2014   Procedure: [facility] SLING ;  Surgeon: Irine Seal, MD;  Location[facility];  Service: Urology;  Laterality: N/A;  . CARDIOVASCULAR STRESS TEST  03-27-2008   NORMAL PERFUSION STUDY/  EF 54%  . COLON SURGERY  2010   for rectal ca  . COLONOSCOPY    . EUS N/A 03/02/2017   Procedure: LOWER ENDOSCOPIC ULTRASOUND (EUS);  Surgeon: Milus Banister, MD;  Location: [facility];  Service: Endoscopy;  Laterality: N/A;  . EXCISION LOWER ABDOMINAL SCAR POST ABDOMINOPLASTY  01-05-2000  . ORIF RIGHT ANKLE FX  12-24-2008  . RIGHT ANKLE ARTHROSCOPY W/ DEBRIDEMENT AND OPEN REMOVAL HARDWARE  03-12-2010  . TRANSTHORACIC ECHOCARDIOGRAM  08-05-2009   MILD LVH/  EF 60-65%  . TUBAL LIGATION      Social History   Tobacco Use  . Smoking status: Former Smoker    Years: 20.00    Types: Cigarettes    Last attempt to quit: 11/14/2006    Years since quitting: 11.2  . Smokeless tobacco: Never Used  Substance Use Topics  . Alcohol use: No  . Drug use: No    Family History  Problem Relation Age of Onset  . Diabetes  Mother   . Hypertension Mother   . Diabetes Sister   . Hypertension Sister   . Diabetes Brother   . Hypertension Brother   . Lupus Daughter   . Colon cancer Neg Hx   . Esophageal cancer Neg Hx   . Rectal cancer Neg Hx   . Stomach cancer Neg Hx     No Known Allergies  Medication list has been reviewed and updated.  Current Outpatient Medications on File Prior to Visit  Medication Sig Dispense Refill  . amLODipine (NORVASC) 10 MG tablet Take 1 tablet (10 mg total) by mouth daily. 90 tablet 3  . aspirin 81 MG tablet Take 81 mg by mouth daily.    Marland Kitchen atorvastatin (LIPITOR) 10 MG tablet Take 1 tablet (10 mg total) by mouth every morning. 90 tablet 3  . betamethasone dipropionate (DIPROLENE) 0.05 % cream Apply topically 2 (two) times daily. 30 g 1  . fluconazole (DIFLUCAN) 200 MG tablet Take one 200 mg tablet on day one and the second 200  mg tablet on day 3. 2 tablet 0  . glucose blood (ACCU-CHEK SMARTVIEW) test strip PATIENT NEEDS OFFICE VISIT FOR ADDITIONAL REFILLS - very overdue for DM check up. 100 each 0  . insulin degludec (TRESIBA FLEXTOUCH) 100 UNIT/ML SOPN FlexTouch Pen Inject 0.15 mLs (15 Units total) into the skin at bedtime. 3 mL 1  . lisinopril (PRINIVIL,ZESTRIL) 10 MG tablet Take 1 tablet (10 mg total) by mouth every morning. 90 tablet 3  . metFORMIN (GLUCOPHAGE) 1000 MG tablet Take 1 tablet (1,000 mg total) by mouth 2 (two) times daily with a meal. 180 tablet 3  . triamcinolone (KENALOG) 0.025 % cream Apply 1 application topically 2 (two) times daily. 30 g 1   Current Facility-Administered Medications on File Prior to Visit  Medication Dose Route Frequency Provider Last Rate Last Dose  . 0.9 %  sodium chloride infusion  500 mL Intravenous Continuous Armbruster, Carlota Raspberry, MD        Review of Systems  Cardiovascular: Negative for chest pain and palpitations.  Gastrointestinal: Negative for diarrhea.  Genitourinary: Negative for frequency.  Neurological: Negative for tingling.   ROS otherwise unremarkable unless listed above.  Physical Examination: BP (!) 150/72 (BP Location: Right Arm, Patient Position: Sitting, Cuff Size: Normal)   Pulse 90   Temp 98.6 F (37 C) (Oral)   Resp 16   Ht 4\' 8"  (1.422 m)   Wt 131 lb 9.6 oz (59.7 kg)   SpO2 96%   BMI 29.50 kg/m  Ideal Body Weight: Weight in (lb) to have BMI = 25: 111.3  Physical Exam  Constitutional: She is oriented to person, place, and time. She appears well-developed and well-nourished. No distress.  HENT:  Head: Normocephalic and atraumatic.  Right Ear: External ear normal.  Left Ear: External ear normal.  Eyes: Pupils are equal, round, and reactive to light. Conjunctivae and EOM are normal.  Cardiovascular: Normal rate, regular rhythm, normal heart sounds and intact distal pulses. Exam reveals no friction rub.  No murmur heard. Pulmonary/Chest:  Effort normal. No respiratory distress.  Neurological: She is alert and oriented to person, place, and time.  Skin: She is not diaphoretic.  Psychiatric: She has a normal mood and affect. Her behavior is normal.     Assessment and Plan: MATEYA TORTI is a 62 y.o. female who is here today for cc of  Chief Complaint  Patient presents with  . Diabetes    follow up  we  discussed diabetic diet at length.  Advised to eliminate the grapes, oranges, orange juice.  Also discussed avoiding pastas, rices, and breads.  She will increase her insulin to 20 units.  Advised to increase her glucose testing to twice per day.   Offered diabetic nutritionist and she would like to participate with this. Type 2 diabetes mellitus with hyperglycemia, with long-term current use of insulin (HCC) - Plan: POCT glycosylated hemoglobin (Hb Y3O), Basic metabolic panel, Referral to Nutrition and Diabetes Services  Type 2 diabetes mellitus without complication, with long-term current use of insulin (HCC)  IDDM (insulin dependent diabetes mellitus) ([place])  Ivar Drape, PA-C [facility] 3/26/20197:57 PM

## 2018-02-06 LAB — BASIC METABOLIC PANEL
BUN/Creatinine Ratio: 15 (ref 12–28)
BUN: 11 mg/dL (ref 8–27)
CHLORIDE: 96 mmol/L (ref 96–106)
CO2: 17 mmol/L — AB (ref 20–29)
Calcium: 10.1 mg/dL (ref 8.7–10.3)
Creatinine, Ser: 0.71 mg/dL (ref 0.57–1.00)
GFR, EST AFRICAN AMERICAN: 106 mL/min/{1.73_m2} (ref >59)
GFR, EST NON AFRICAN AMERICAN: 92 mL/min/{1.73_m2} (ref >59)
Glucose: 202 mg/dL — ABNORMAL HIGH (ref 65–99)
POTASSIUM: 4.5 mmol/L (ref 3.5–5.2)
SODIUM: 136 mmol/L (ref 134–144)

## 2018-02-21 ENCOUNTER — Encounter: Payer: Self-pay | Admitting: Physician Assistant

## 2018-02-26 ENCOUNTER — Encounter: Payer: Self-pay | Admitting: Gastroenterology

## 2018-03-08 ENCOUNTER — Encounter: Payer: BC Managed Care – PPO | Attending: Physician Assistant | Admitting: Registered"

## 2018-03-08 DIAGNOSIS — Z713 Dietary counseling and surveillance: Secondary | ICD-10-CM | POA: Diagnosis present

## 2018-03-08 DIAGNOSIS — Z6831 Body mass index (BMI) 31.0-31.9, adult: Secondary | ICD-10-CM | POA: Insufficient documentation

## 2018-03-08 DIAGNOSIS — Z794 Long term (current) use of insulin: Secondary | ICD-10-CM | POA: Insufficient documentation

## 2018-03-08 DIAGNOSIS — E1165 Type 2 diabetes mellitus with hyperglycemia: Secondary | ICD-10-CM | POA: Diagnosis not present

## 2018-03-09 ENCOUNTER — Encounter: Payer: Self-pay | Admitting: Registered"

## 2018-03-09 DIAGNOSIS — E1165 Type 2 diabetes mellitus with hyperglycemia: Secondary | ICD-10-CM | POA: Insufficient documentation

## 2018-03-09 NOTE — Progress Notes (Signed)
Patient was seen on 03/08/2018 for the first of a series of three diabetes self-management courses at [facility].  Patient Education Plan per assessed needs and concerns is to attend three course education program for Diabetes Self Management Education.  The following learning objectives were met by the patient during this class: ? Describe diabetes ? State some common risk factors for diabetes ? Defines the role of glucose and insulin ? Identifies type of diabetes and pathophysiology ? Describe the relationship between diabetes and cardiovascular risk ? State the members of the Healthcare Team ? States the rationale for glucose monitoring ? State when to test glucose ? State their individual Target Range ? State the importance of logging glucose readings ? Describe how to interpret glucose readings ? Identifies A1C target ? Explain the correlation between A1c and eAG values ? State symptoms and treatment of high blood glucose ? State symptoms and treatment of low blood glucose ? Explain proper technique for glucose testing ? Identifies proper sharps disposal  Handouts given during class include: ? Live Life Well with Diabetes ? Carb Counting and Meal Planning book ? Meal Plan Card ? Meal planning worksheet ? Low Sodium Flavoring Tips ? Types of Fats ? The diabetes portion plate ? A1c to eAG Conversion Chart ? Diabetes Recommended Care Schedule ? Support Group ? Diabetes Success Plan ? Core Class Satisfaction Survey   Follow-Up Plan: ? Attend core 2           

## 2018-03-15 ENCOUNTER — Encounter: Payer: BC Managed Care – PPO | Attending: Physician Assistant | Admitting: Dietician

## 2018-03-15 DIAGNOSIS — Z713 Dietary counseling and surveillance: Secondary | ICD-10-CM | POA: Diagnosis not present

## 2018-03-15 DIAGNOSIS — E1165 Type 2 diabetes mellitus with hyperglycemia: Secondary | ICD-10-CM | POA: Insufficient documentation

## 2018-03-15 DIAGNOSIS — Z794 Long term (current) use of insulin: Secondary | ICD-10-CM | POA: Diagnosis not present

## 2018-03-15 DIAGNOSIS — Z6831 Body mass index (BMI) 31.0-31.9, adult: Secondary | ICD-10-CM | POA: Diagnosis not present

## 2018-03-15 DIAGNOSIS — E119 Type 2 diabetes mellitus without complications: Secondary | ICD-10-CM

## 2018-03-16 NOTE — Progress Notes (Signed)
Patient was seen on 03/15/18 for the second of a series of three diabetes self-management courses at the [facility]. The following learning objectives were met by the patient during this class:   Describe the role of different macronutrients on glucose  Explain how carbohydrates affect blood glucose  State what foods contain the most carbohydrates  Demonstrate carbohydrate counting  Demonstrate how to read Nutrition Facts food label  Describe effects of various fats on heart health  Describe the importance of good nutrition for health and healthy eating strategies  Describe techniques for managing your shopping, cooking and meal planning  List strategies to follow meal plan when dining out  Describe the effects of alcohol on glucose and how to use it safely  Goals:  Follow Diabetes Meal Plan as instructed  Aim to spread carbs evenly throughout the day  Aim for 3 meals per day and snacks as needed Include lean protein foods to meals/snacks  Monitor glucose levels as instructed by your doctor   Follow-Up Plan:  Attend Core 3  Work towards following your personal food plan.

## 2018-04-12 ENCOUNTER — Other Ambulatory Visit: Payer: Self-pay

## 2018-04-12 ENCOUNTER — Encounter: Payer: Self-pay | Admitting: Family Medicine

## 2018-04-12 ENCOUNTER — Ambulatory Visit: Payer: BC Managed Care – PPO

## 2018-04-12 ENCOUNTER — Ambulatory Visit: Payer: BC Managed Care – PPO | Admitting: Family Medicine

## 2018-04-12 VITALS — BP 120/62 | HR 95 | Temp 99.2°F | Ht 58.5 in | Wt 129.8 lb

## 2018-04-12 DIAGNOSIS — R059 Cough, unspecified: Secondary | ICD-10-CM

## 2018-04-12 DIAGNOSIS — J029 Acute pharyngitis, unspecified: Secondary | ICD-10-CM | POA: Diagnosis not present

## 2018-04-12 DIAGNOSIS — R05 Cough: Secondary | ICD-10-CM | POA: Diagnosis not present

## 2018-04-12 DIAGNOSIS — R6889 Other general symptoms and signs: Secondary | ICD-10-CM | POA: Diagnosis not present

## 2018-04-12 DIAGNOSIS — J069 Acute upper respiratory infection, unspecified: Secondary | ICD-10-CM | POA: Diagnosis not present

## 2018-04-12 LAB — POCT INFLUENZA A/B
INFLUENZA A, POC: NEGATIVE
Influenza B, POC: NEGATIVE

## 2018-04-12 LAB — POCT RAPID STREP A (OFFICE): Rapid Strep A Screen: NEGATIVE

## 2018-04-12 MED ORDER — HYDROCODONE-HOMATROPINE 5-1.5 MG/5ML PO SYRP: ORAL_SOLUTION | ORAL | 0 refills | Status: DC

## 2018-04-12 MED ORDER — BENZONATATE 100 MG PO CAPS: 100.0000 mg | ORAL_CAPSULE | Freq: Three times a day (TID) | ORAL | 0 refills | Status: DC | PRN

## 2018-04-12 NOTE — Patient Instructions (Addendum)
Strep testing and flu testing was negative or normal.  I suspect you have an upper respiratory infection caused by a virus.  Mucinex over-the-counter is okay if needed, but I also prescribed Tessalon to use 3 times per day as needed for your cough.  Cepacol cough drops may also help sore throat.  Hydrocodone cough syrup at night if needed.  Tylenol over-the-counter for muscle aches but soreness should improve as coughing improves. Return to the clinic or go to the nearest emergency room if any of your symptoms worsen or new symptoms occur.   Thank you for coming in today. Upper Respiratory Infection, Adult Most upper respiratory infections (URIs) are caused by a virus. A URI affects the nose, throat, and upper air passages. The most common type of URI is often called "the common cold." Follow these instructions at home:  Take medicines only as told by your doctor.  Gargle warm saltwater or take cough drops to comfort your throat as told by your doctor.  Use a warm mist humidifier or inhale steam from a shower to increase air moisture. This may make it easier to breathe.  Drink enough fluid to keep your pee (urine) clear or pale yellow.  Eat soups and other clear broths.  Have a healthy diet.  Rest as needed.  Go back to work when your fever is gone or your doctor says it is okay. ? You may need to stay home longer to avoid giving your URI to others. ? You can also wear a face mask and wash your hands often to prevent spread of the virus.  Use your inhaler more if you have asthma.  Do not use any tobacco products, including cigarettes, chewing tobacco, or electronic cigarettes. If you need help quitting, ask your doctor. Contact a doctor if:  You are getting worse, not better.  Your symptoms are not helped by medicine.  You have chills.  You are getting more short of breath.  You have brown or red mucus.  You have yellow or brown discharge from your nose.  You have pain in  your face, especially when you bend forward.  You have a fever.  You have puffy (swollen) neck glands.  You have pain while swallowing.  You have white areas in the back of your throat. Get help right away if:  You have very bad or constant: ? Headache. ? Ear pain. ? Pain in your forehead, behind your eyes, and over your cheekbones (sinus pain). ? Chest pain.  You have long-lasting (chronic) lung disease and any of the following: ? Wheezing. ? Long-lasting cough. ? Coughing up blood. ? A change in your usual mucus.  You have a stiff neck.  You have changes in your: ? Vision. ? Hearing. ? Thinking. ? Mood. This information is not intended to replace advice given to you by your health care provider. Make sure you discuss any questions you have with your health care provider. Document Released: 04/18/2008 Document Revised: 07/03/2016 Document Reviewed: 02/05/2014 Elsevier Interactive Patient Education  2018 [employer].    IF you received an x-ray today, you will receive an invoice from [facility]iology. Please contact [facility] Radiology at [phone] with questions or concerns regarding your invoice.   IF you received labwork today, you will receive an invoice from [facility]. Please contact [facility] at [phone] with questions or concerns regarding your invoice.   Our billing staff will not be able to assist you with questions regarding bills from these companies.  You  will be contacted with the lab results as soon as they are available. The fastest way to get your results is to activate your My Chart account. Instructions are located on the last page of this paperwork. If you have not heard from [place] regarding the results in 2 weeks, please contact this office.

## 2018-04-12 NOTE — Progress Notes (Signed)
Subjective:  By signing my name below, I, Gabrielle [name], attest that this documentation has been prepared under the direction and in the presence of [name], MD Electronically Signed: Ladene Artist, ED Scribe 04/12/2018 at 2:40 PM.   Patient ID: Gabrielle [name], female    DOB: 07/28/1956, 62 y.o.   MRN: [id number]  Chief Complaint  Patient presents with  . Sore thoat    cough, runny nose and snezing. Cough is now hurting chest. Going on since Monday night   HPI Gabrielle [name] is a 62 y.o. female who presents to Primary Care at [facility] complaining of sore throat onset 3 days ago, worse with swallowing. Pt reports associated symptoms of cough, chest soreness only with coughing, rhinorrhea, sneezing, HA, mild diarrhea. She has tried gargling salt water, DayQuil and NyQuil with minimal relief. Denies fever. Pt works at [employer] as a Research scientist (physical sciences) but no known sick contacts.  DM Pt reports blood glucose readings of 120 over the past wk.  Patient Active Problem List   Diagnosis Date Noted  . Uncontrolled type 2 diabetes mellitus with hyperglycemia ([place]) 03/09/2018  . Rectal carcinoid tumor   . Sepsis due to urinary tract infection ([place]) 02/02/2015  . Essential hypertension 02/02/2015  . Atherosclerosis of native arteries of extremity with intermittent claudication ([place]) 02/02/2015  . Sepsis ([place]) 02/02/2015  . Stress incontinence in female 03/06/2014  . 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 Diabetes mellitus ([place]) 02/28/2012  . High cholesterol 02/28/2012  . Hypertension 02/28/2012   Past Medical History:  Diagnosis Date  . Cancer ([place])    rectal CA  . Cataract   . Eczema   . Hyperlipidemia   . Hypertension   . Intrinsic (urethral) sphincter deficiency (ISD)   . RBBB   . SUI (stress urinary incontinence, female)    hx of  . Type 2 diabetes mellitus ([place])   . Wears dentures    UPPER  . Wears glasses    Past Surgical History:  Procedure Laterality  Date  . ABDOMINAL HYSTERECTOMY  1998   fibroids, partial  . ABDOMINOPLASTY  1999  . BLADDER SUSPENSION N/A 03/06/2014   Procedure: [facility] SLING ;  Surgeon: Irine Seal, MD;  Location: [facility];  Service: Urology;  Laterality: N/A;  . CARDIOVASCULAR STRESS TEST  03-27-2008   NORMAL PERFUSION STUDY/  EF 54%  . COLON SURGERY  2010   for rectal ca  . COLONOSCOPY    . EUS N/A 03/02/2017   Procedure: LOWER ENDOSCOPIC ULTRASOUND (EUS);  Surgeon: Milus Banister, MD;  Location: [facility];  Service: Endoscopy;  Laterality: N/A;  . EXCISION LOWER ABDOMINAL SCAR POST ABDOMINOPLASTY  01-05-2000  . ORIF RIGHT ANKLE FX  12-24-2008  . RIGHT ANKLE ARTHROSCOPY W/ DEBRIDEMENT AND OPEN REMOVAL HARDWARE  03-12-2010  . TRANSTHORACIC ECHOCARDIOGRAM  08-05-2009   MILD LVH/  EF 60-65%  . TUBAL LIGATION     No Known Allergies Prior to Admission medications   Medication Sig Start Date End Date Taking? Authorizing Provider  amLODipine (NORVASC) 10 MG tablet Take 1 tablet (10 mg total) by mouth daily. 11/13/17   Ivar Drape D, PA  aspirin 81 MG tablet Take 81 mg by mouth daily.    [provider]  atorvastatin (LIPITOR) 10 MG tablet Take 1 tablet (10 mg total) by mouth every morning. 11/13/17   Ivar Drape D, PA  betamethasone dipropionate (DIPROLENE) 0.05 % cream Apply topically 2 (two) times daily. 11/13/17  Ivar Drape D, PA  fluconazole (DIFLUCAN) 200 MG tablet Take one 200 mg tablet on day one and the second 200 mg tablet on day 3. 04/09/17   Nehemiah Settle, NP  glucose blood (ACCU-CHEK SMARTVIEW) test strip PATIENT NEEDS OFFICE VISIT FOR ADDITIONAL REFILLS - very overdue for DM check up. 11/11/15   Ivar Drape D, PA  insulin degludec (TRESIBA FLEXTOUCH) 100 UNIT/ML SOPN FlexTouch Pen Inject 0.15 mLs (15 Units total) into the skin at bedtime. 11/13/17   Ivar Drape D, PA  lisinopril (PRINIVIL,ZESTRIL) 10 MG tablet Take 1 tablet (10 mg total) by  mouth every morning. 11/13/17   Ivar Drape D, PA  metFORMIN (GLUCOPHAGE) 1000 MG tablet Take 1 tablet (1,000 mg total) by mouth 2 (two) times daily with a meal. 11/13/17   English, Colletta [place] D, PA  triamcinolone (KENALOG) 0.025 % cream Apply 1 application topically 2 (two) times daily. 11/13/17   Joretta Bachelor, PA   Social History   Socioeconomic History  . Marital status: Married    Spouse name: Not on file  . Number of children: Not on file  . Years of education: Not on file  . Highest education level: Not on file  Occupational History  . Not on file  Social Needs  . Financial resource strain: Not on file  . Food insecurity:    Worry: Not on file    Inability: Not on file  . Transportation needs:    Medical: Not on file    Non-medical: Not on file  Tobacco Use  . Smoking status: Former Smoker    Years: 20.00    Types: Cigarettes    Last attempt to quit: 11/14/2006    Years since quitting: 11.4  . Smokeless tobacco: Never Used  Substance and Sexual Activity  . Alcohol use: No  . Drug use: No  . Sexual activity: Not on file  Lifestyle  . Physical activity:    Days per week: Not on file    Minutes per session: Not on file  . Stress: Not on file  Relationships  . Social connections:    Talks on phone: Not on file    Gets together: Not on file    Attends religious service: Not on file    Active member of club or organization: Not on file    Attends meetings of clubs or organizations: Not on file    Relationship status: Not on file  . Intimate partner violence:    Fear of current or ex partner: Not on file    Emotionally abused: Not on file    Physically abused: Not on file    Forced sexual activity: Not on file  Other Topics Concern  . Not on file  Social History Narrative  . Not on file   Review of Systems  Constitutional: Negative for fever.  HENT: Positive for rhinorrhea, sneezing and sore throat.   Respiratory: Positive for cough.     Gastrointestinal: Positive for diarrhea (mild).  Neurological: Positive for headaches.      Objective:   Physical Exam  Constitutional: She is oriented to person, place, and time. She appears well-developed and well-nourished. No distress.  HENT:  Head: Normocephalic and atraumatic.  Right Ear: Hearing, tympanic membrane, external ear and ear canal normal.  Left Ear: Hearing, tympanic membrane, external ear and ear canal normal.  Nose: Nose normal.  Mouth/Throat: Posterior oropharyngeal erythema (minimal) present. No oropharyngeal exudate.  No tonsillar hypertrophy.  Eyes: Pupils are equal, round,  and reactive to light. Conjunctivae and EOM are normal.  Cardiovascular: Normal rate, regular rhythm, normal heart sounds and intact distal pulses.  No murmur heard. Pulmonary/Chest: Effort normal and breath sounds normal. No respiratory distress. She has no wheezes. She has no rhonchi.  Lymphadenopathy:    She has no cervical adenopathy.  Neurological: She is alert and oriented to person, place, and time.  Skin: Skin is warm and dry. No rash noted.  Psychiatric: She has a normal mood and affect. Her behavior is normal.  Vitals reviewed.  Vitals:   04/12/18 1421  BP: 120/62  Pulse: 95  Temp: 99.2 F (37.3 C)  TempSrc: Oral  SpO2: 94%  Weight: 129 lb 12.8 oz (58.9 kg)  Height: 4' 10.5" (1.486 m)   Results for orders placed or performed in visit on 04/12/18  POCT Influenza A/B  Result Value Ref Range   Influenza A, POC Negative Negative   Influenza B, POC Negative Negative  POCT rapid strep A  Result Value Ref Range   Rapid Strep A Screen Negative Negative      Assessment & Plan:   JASIYA MARKIE is a 62 y.o. female Flu-like symptoms - Plan: POCT Influenza A/B  Sore throat - Plan: POCT rapid strep A  Acute upper respiratory infection - Plan: benzonatate (TESSALON) 100 MG capsule, HYDROcodone-homatropine (HYCODAN) 5-1.5 MG/5ML syrup  Cough - Plan: benzonatate  (TESSALON) 100 MG capsule, HYDROcodone-homatropine (HYCODAN) 5-1.5 MG/5ML syrup  Suspected viral upper respiratory infection.  Reassuring exam and testing as above.  Symptomatic care discussed, coupon for Cepacol cough drops and Mucinex.  Tessalon prescribed during the day or Mucinex, Hycodan cough syrup at night.  Side effects discussed.  Note provided for work and return to clinic precautions given.  Meds ordered this encounter  Medications  . benzonatate (TESSALON) 100 MG capsule    Sig: Take 1 capsule (100 mg total) by mouth 3 (three) times daily as needed for cough.    Dispense:  20 capsule    Refill:  0  . HYDROcodone-homatropine (HYCODAN) 5-1.5 MG/5ML syrup    Sig: 3m by mouth a bedtime as needed for cough.    Dispense:  120 mL    Refill:  0   Patient Instructions    Strep testing and flu testing was negative or normal.  I suspect you have an upper respiratory infection caused by a virus.  Mucinex over-the-counter is okay if needed, but I also prescribed Tessalon to use 3 times per day as needed for your cough.  Cepacol cough drops may also help sore throat.  Hydrocodone cough syrup at night if needed.  Tylenol over-the-counter for muscle aches but soreness should improve as coughing improves. Return to the clinic or go to the nearest emergency room if any of your symptoms worsen or new symptoms occur.   Thank you for coming in today. Upper Respiratory Infection, Adult Most upper respiratory infections (URIs) are caused by a virus. A URI affects the nose, throat, and upper air passages. The most common type of URI is often called "the common cold." Follow these instructions at home:  Take medicines only as told by your doctor.  Gargle warm saltwater or take cough drops to comfort your throat as told by your doctor.  Use a warm mist humidifier or inhale steam from a shower to increase air moisture. This may make it easier to breathe.  Drink enough fluid to keep your pee (urine)  clear or pale yellow.  Eat soups and  other clear broths.  Have a healthy diet.  Rest as needed.  Go back to work when your fever is gone or your doctor says it is okay. ? You may need to stay home longer to avoid giving your URI to others. ? You can also wear a face mask and wash your hands often to prevent spread of the virus.  Use your inhaler more if you have asthma.  Do not use any tobacco products, including cigarettes, chewing tobacco, or electronic cigarettes. If you need help quitting, ask your doctor. Contact a doctor if:  You are getting worse, not better.  Your symptoms are not helped by medicine.  You have chills.  You are getting more short of breath.  You have brown or red mucus.  You have yellow or brown discharge from your nose.  You have pain in your face, especially when you bend forward.  You have a fever.  You have puffy (swollen) neck glands.  You have pain while swallowing.  You have white areas in the back of your throat. Get help right away if:  You have very bad or constant: ? Headache. ? Ear pain. ? Pain in your forehead, behind your eyes, and over your cheekbones (sinus pain). ? Chest pain.  You have long-lasting (chronic) lung disease and any of the following: ? Wheezing. ? Long-lasting cough. ? Coughing up blood. ? A change in your usual mucus.  You have a stiff neck.  You have changes in your: ? Vision. ? Hearing. ? Thinking. ? Mood. This information is not intended to replace advice given to you by your health care provider. Make sure you discuss any questions you have with your health care provider. Document Released: 04/18/2008 Document Revised: 07/03/2016 Document Reviewed: 02/05/2014 Elsevier Interactive Patient Education  2018 [employer].    IF you received an x-ray today, you will receive an invoice from [facility]. Please contact [facility] at [phone] with questions or concerns regarding  your invoice.   IF you received labwork today, you will receive an invoice from [facility]. Please contact [facility] at [phone] with questions or concerns regarding your invoice.   Our billing staff will not be able to assist you with questions regarding bills from these companies.  You will be contacted with the lab results as soon as they are available. The fastest way to get your results is to activate your My Chart account. Instructions are located on the last page of this paperwork. If you have not heard from [place] regarding the results in 2 weeks, please contact this office.       I personally performed the services described in this documentation, which was scribed in my presence. The recorded information has been reviewed and considered for accuracy and completeness, addended by me as needed, and agree with information above.  Signed,   Merri Ray, MD Primary [facility] at [facility].  04/12/18 2:57 PM

## 2018-05-03 ENCOUNTER — Encounter: Payer: Self-pay | Admitting: Dietician

## 2018-05-03 ENCOUNTER — Encounter: Payer: BC Managed Care – PPO | Attending: Physician Assistant | Admitting: Dietician

## 2018-05-03 DIAGNOSIS — E1165 Type 2 diabetes mellitus with hyperglycemia: Secondary | ICD-10-CM | POA: Diagnosis not present

## 2018-05-03 DIAGNOSIS — Z6831 Body mass index (BMI) 31.0-31.9, adult: Secondary | ICD-10-CM | POA: Insufficient documentation

## 2018-05-03 DIAGNOSIS — Z713 Dietary counseling and surveillance: Secondary | ICD-10-CM | POA: Insufficient documentation

## 2018-05-03 DIAGNOSIS — Z794 Long term (current) use of insulin: Secondary | ICD-10-CM | POA: Diagnosis not present

## 2018-05-03 DIAGNOSIS — E119 Type 2 diabetes mellitus without complications: Secondary | ICD-10-CM

## 2018-05-03 NOTE — Progress Notes (Signed)
Patient was seen on 05/03/18 for the third of a series of three diabetes self-management courses at [facility].   Gabrielle [name] the amount of activity recommended for healthy living . Describe activities suitable for individual needs . Identify ways to regularly incorporate activity into daily life . Identify barriers to activity and ways to over come these barriers  Identify diabetes medications being personally used and their primary action for lowering glucose and possible side effects . Describe role of stress on blood glucose and develop strategies to address psychosocial issues . Identify diabetes complications and ways to prevent them  Explain how to manage diabetes during illness . Evaluate success in meeting personal goal . Establish 2-3 goals that they will plan to diligently work on  Goals:   I will count my carb choices at most meals and snacks  I will be active 10 minutes or more 3 times a week  I will take my diabetes medications as scheduled  I will eat less unhealthy fats  I will test my glucose at least 2 times a day, 7 days a week   Your patient has identified these potential barriers to change:  Stress  Your patient has identified their diabetes self-care support plan as  Family Support   Plan:  Attend Support Group as desired

## 2018-05-09 ENCOUNTER — Other Ambulatory Visit: Payer: Self-pay

## 2018-05-09 ENCOUNTER — Ambulatory Visit: Payer: BC Managed Care – PPO | Admitting: Physician Assistant

## 2018-05-09 VITALS — HR 89 | Temp 98.8°F | Ht <= 58 in | Wt 130.6 lb

## 2018-05-09 DIAGNOSIS — Z794 Long term (current) use of insulin: Secondary | ICD-10-CM | POA: Diagnosis not present

## 2018-05-09 DIAGNOSIS — IMO0001 Reserved for inherently not codable concepts without codable children: Secondary | ICD-10-CM

## 2018-05-09 DIAGNOSIS — E1165 Type 2 diabetes mellitus with hyperglycemia: Secondary | ICD-10-CM

## 2018-05-09 DIAGNOSIS — E119 Type 2 diabetes mellitus without complications: Secondary | ICD-10-CM | POA: Diagnosis not present

## 2018-05-09 LAB — POCT GLYCOSYLATED HEMOGLOBIN (HGB A1C): Hemoglobin A1C: 7.8 % — AB (ref 4.0–5.6)

## 2018-05-09 LAB — POCT URINALYSIS DIP (MANUAL ENTRY)
Bilirubin, UA: NEGATIVE
Glucose, UA: >=1000 mg/dL — AB
Leukocytes, UA: NEGATIVE
Nitrite, UA: NEGATIVE
Protein Ur, POC: NEGATIVE mg/dL
Spec Grav, UA: 1.02 (ref 1.010–1.025)
Urobilinogen, UA: 0.2 E.U./dL
pH, UA: 7 (ref 5.0–8.0)

## 2018-05-09 MED ORDER — INSULIN DEGLUDEC 100 UNIT/ML ~~LOC~~ SOPN: 20.0000 [IU] | PEN_INJECTOR | Freq: Every day | SUBCUTANEOUS | 1 refills | Status: DC

## 2018-05-09 NOTE — Patient Instructions (Addendum)
Keep up the great work! Your A1C is 7.8 today.  Do not drink fluids after 7pm.  Come back and see me in 3 months for recheck.    Results for orders placed or performed in visit on 05/09/18  POCT urinalysis dipstick  Result Value Ref Range   Color, UA yellow yellow   Clarity, UA clear clear   Glucose, UA >=1,000 (A) negative mg/dL   Bilirubin, UA negative negative   Ketones, POC UA trace (5) (A) negative mg/dL   Spec Grav, UA 1.020 1.010 - 1.025   Blood, UA trace-intact (A) negative   pH, UA 7.0 5.0 - 8.0   Protein Ur, POC negative negative mg/dL   Urobilinogen, UA 0.2 0.2 or 1.0 E.U./dL   Nitrite, UA Negative Negative   Leukocytes, UA Negative Negative  POCT glycosylated hemoglobin (Hb A1C)  Result Value Ref Range   Hemoglobin A1C 7.8 (A) 4.0 - 5.6 %   HbA1c POC (<> result, manual entry)  4.0 - 5.6 %   HbA1c, POC (prediabetic range)  5.7 - 6.4 %   HbA1c, POC (controlled diabetic range)  0.0 - 7.0 %      Diabetes Mellitus and Exercise Exercising regularly is important for your overall health, especially when you have diabetes (diabetes mellitus). Exercising is not only about losing weight. It has many health benefits, such as increasing muscle strength and bone density and reducing body fat and stress. This leads to improved fitness, flexibility, and endurance, all of which result in better overall health. Exercise has additional benefits for people with diabetes, including:  Reducing appetite.  Helping to lower and control blood glucose.  Lowering blood pressure.  Helping to control amounts of fatty substances (lipids) in the blood, such as cholesterol and triglycerides.  Helping the body to respond better to insulin (improving insulin sensitivity).  Reducing how much insulin the body needs.  Decreasing the risk for heart disease by: ? Lowering cholesterol and triglyceride levels. ? Increasing the levels of good cholesterol. ? Lowering blood glucose levels.  What is  my activity plan? Your health care provider or certified diabetes educator can help you make a plan for the type and frequency of exercise (activity plan) that works for you. Make sure that you:  Do at least 150 minutes of moderate-intensity or vigorous-intensity exercise each week. This could be brisk walking, biking, or water aerobics. ? Do stretching and strength exercises, such as yoga or weightlifting, at least 2 times a week. ? Spread out your activity over at least 3 days of the week.  Get some form of physical activity every day. ? Do not go more than 2 days in a row without some kind of physical activity. ? Avoid being inactive for more than 90 minutes at a time. Take frequent breaks to walk or stretch.  Choose a type of exercise or activity that you enjoy, and set realistic goals.  Start slowly, and gradually increase the intensity of your exercise over time.  What do I need to know about managing my diabetes?  Check your blood glucose before and after exercising. ? If your blood glucose is higher than 240 mg/dL (13.3 mmol/L) before you exercise, check your urine for ketones. If you have ketones in your urine, do not exercise until your blood glucose returns to normal.  Know the symptoms of low blood glucose (hypoglycemia) and how to treat it. Your risk for hypoglycemia increases during and after exercise. Common symptoms of hypoglycemia can include: ? Hunger. ?  Anxiety. ? Sweating and feeling clammy. ? Confusion. ? Dizziness or feeling light-headed. ? Increased heart rate or palpitations. ? Blurry vision. ? Tingling or numbness around the mouth, lips, or tongue. ? Tremors or shakes. ? Irritability.  Keep a rapid-acting carbohydrate snack available before, during, and after exercise to help prevent or treat hypoglycemia.  Avoid injecting insulin into areas of the body that are going to be exercised. For example, avoid injecting insulin into: ? The arms, when playing  tennis. ? The legs, when jogging.  Keep records of your exercise habits. Doing this can help you and your health care provider adjust your diabetes management plan as needed. Write down: ? Food that you eat before and after you exercise. ? Blood glucose levels before and after you exercise. ? The type and amount of exercise you have done. ? When your insulin is expected to peak, if you use insulin. Avoid exercising at times when your insulin is peaking.  When you start a new exercise or activity, work with your health care provider to make sure the activity is safe for you, and to adjust your insulin, medicines, or food intake as needed.  Drink plenty of water while you exercise to prevent dehydration or heat stroke. Drink enough fluid to keep your urine clear or pale yellow. This information is not intended to replace advice given to you by your health care provider. Make sure you discuss any questions you have with your health care provider. Document Released: 01/21/2004 Document Revised: 05/20/2016 Document Reviewed: 04/11/2016 Elsevier Interactive Patient Education  2018 [employer].  IF you received an x-ray today, you will receive an invoice from [facility]. Please contact [facility] at [phone] with questions or concerns regarding your invoice.   IF you received labwork today, you will receive an invoice from [facility]. Please contact [facility] at [phone] with questions or concerns regarding your invoice.   Our billing staff will not be able to assist you with questions regarding bills from these companies.  You will be contacted with the lab results as soon as they are available. The fastest way to get your results is to activate your My Chart account. Instructions are located on the last page of this paperwork. If you have not heard from [place] regarding the results in 2 weeks, please contact this office.

## 2018-05-09 NOTE — Progress Notes (Signed)
Gabrielle [name]  MRN: [id number] DOB: February 14, 1956  PCP: Joretta Bachelor, PA  Subjective:  Pt is a 62 year old female who presents to clinic for DM f/u.   She is taking her metformin 1,000mg  twice per day.   She is taking the insulin at night with 20 units. She increased this dose from 15 units at her last OV.  She is not checking her blood sugar at home.   She is trying to work on her diet at home. Drinks 1 glass of sweet tea with dinner.   Review of Systems  Constitutional: Negative for appetite change, diaphoresis, fatigue and unexpected weight change.  Gastrointestinal: Negative for abdominal pain, diarrhea, nausea and vomiting.  Endocrine: Negative for polydipsia, polyphagia and polyuria.    Patient Active Problem List   Diagnosis Date Noted  . Uncontrolled type 2 diabetes mellitus with hyperglycemia ([place]) 03/09/2018  . Rectal carcinoid tumor   . Sepsis due to urinary tract infection ([place]) 02/02/2015  . Essential hypertension 02/02/2015  . Atherosclerosis of native arteries of extremity with intermittent claudication ([place]) 02/02/2015  . Sepsis ([place]) 02/02/2015  . Stress incontinence in female 03/06/2014  . 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 Diabetes mellitus ([place]) 02/28/2012  . High cholesterol 02/28/2012  . Hypertension 02/28/2012    Current Outpatient Medications on File Prior to Visit  Medication Sig Dispense Refill  . amLODipine (NORVASC) 10 MG tablet Take 1 tablet (10 mg total) by mouth daily. 90 tablet 3  . aspirin 81 MG tablet Take 81 mg by mouth daily.    Marland Kitchen atorvastatin (LIPITOR) 10 MG tablet Take 1 tablet (10 mg total) by mouth every morning. 90 tablet 3  . benzonatate (TESSALON) 100 MG capsule Take 1 capsule (100 mg total) by mouth 3 (three) times daily as needed for cough. 20 capsule 0  . betamethasone dipropionate (DIPROLENE) 0.05 % cream Apply topically 2 (two) times daily. 30 g 1  . glucose blood (ACCU-CHEK SMARTVIEW) test strip PATIENT  NEEDS OFFICE VISIT FOR ADDITIONAL REFILLS - very overdue for DM check up. 100 each 0  . HYDROcodone-homatropine (HYCODAN) 5-1.5 MG/5ML syrup 43m by mouth a bedtime as needed for cough. 120 mL 0  . insulin degludec (TRESIBA FLEXTOUCH) 100 UNIT/ML SOPN FlexTouch Pen Inject 0.15 mLs (15 Units total) into the skin at bedtime. 3 mL 1  . lisinopril (PRINIVIL,ZESTRIL) 10 MG tablet Take 1 tablet (10 mg total) by mouth every morning. 90 tablet 3  . metFORMIN (GLUCOPHAGE) 1000 MG tablet Take 1 tablet (1,000 mg total) by mouth 2 (two) times daily with a meal. 180 tablet 3  . triamcinolone (KENALOG) 0.025 % cream Apply 1 application topically 2 (two) times daily. 30 g 1   Current Facility-Administered Medications on File Prior to Visit  Medication Dose Route Frequency Provider Last Rate Last Dose  . 0.9 %  sodium chloride infusion  500 mL Intravenous Continuous Armbruster, Carlota Raspberry, MD        No Known Allergies   Objective:  Pulse 89   Temp 98.8 F (37.1 C) (Oral)   Ht 4\' 10"  (1.473 m)   Wt 130 lb 9.6 oz (59.2 kg)   SpO2 96%   BMI 27.30 kg/m  Diabetic Foot Exam - Simple   Simple Foot Form Diabetic Foot exam was performed with the following findings:  Yes 05/09/2018  4:37 PM  Visual Inspection No deformities, no ulcerations, no other skin breakdown bilaterally:  Yes Sensation Testing Intact to touch and monofilament testing  bilaterally:  Yes Pulse Check Comments Could not feel pedal pulses on foot exam     Physical Exam  Constitutional: She is oriented to person, place, and time. No distress.  Cardiovascular: Normal rate, regular rhythm and normal heart sounds.  Neurological: She is alert and oriented to person, place, and time.  Skin: Skin is warm and dry.  Psychiatric: Judgment normal.  Vitals reviewed.   Lab Results  Component Value Date   HGBA1C 11.0 02/05/2018   Results for orders placed or performed in visit on 05/09/18  POCT urinalysis dipstick  Result Value Ref Range    Color, UA yellow yellow   Clarity, UA clear clear   Glucose, UA >=1,000 (A) negative mg/dL   Bilirubin, UA negative negative   Ketones, POC UA trace (5) (A) negative mg/dL   Spec Grav, UA 1.020 1.010 - 1.025   Blood, UA trace-intact (A) negative   pH, UA 7.0 5.0 - 8.0   Protein Ur, POC negative negative mg/dL   Urobilinogen, UA 0.2 0.2 or 1.0 E.U./dL   Nitrite, UA Negative Negative   Leukocytes, UA Negative Negative  POCT glycosylated hemoglobin (Hb A1C)  Result Value Ref Range   Hemoglobin A1C 7.8 (A) 4.0 - 5.6 %   HbA1c POC (<> result, manual entry)  4.0 - 5.6 %   HbA1c, POC (prediabetic range)  5.7 - 6.4 %   HbA1c, POC (controlled diabetic range)  0.0 - 7.0 %    Assessment and Plan :  1. Type 2 diabetes mellitus with hyperglycemia, with long-term current use of insulin ([place]) 2. IDDM (insulin dependent diabetes mellitus) ([place]) - Pt presents for f/u DM. Last OV increased insulin to 20 units daily. A1C is 7.8, this is down from 11 three months ago. Encouraged con't improvement of diet.  RTC in 3 months to recheck.  - HM Diabetes Foot Exam - POCT urinalysis dipstick - POCT glycosylated hemoglobin (Hb P7T) - Basic Metabolic Panel - insulin degludec (TRESIBA FLEXTOUCH) 100 UNIT/ML SOPN FlexTouch Pen; Inject 0.2 mLs (20 Units total) into the skin at bedtime.  Dispense: 3 mL; Refill: 1   Whitney Arav Bannister, PA-C  [facility] at [place] [facility] [place] 05/09/2018 5:16 PM

## 2018-05-10 ENCOUNTER — Telehealth: Payer: Self-pay | Admitting: General Practice

## 2018-05-10 LAB — BASIC METABOLIC PANEL
CO2: 23 mmol/L (ref 20–29)
Calcium: 10.1 mg/dL (ref 8.7–10.3)
Creatinine, Ser: 0.81 mg/dL (ref 0.57–1.00)
GFR calc non Af Amer: 79 mL/min/{1.73_m2} (ref >59)

## 2018-05-10 LAB — BASIC METABOLIC PANEL WITH GFR
BUN/Creatinine Ratio: 15 (ref 12–28)
BUN: 12 mg/dL (ref 8–27)
Chloride: 99 mmol/L (ref 96–106)
GFR calc Af Amer: 91 mL/min/{1.73_m2} (ref >59)
Glucose: 207 mg/dL — ABNORMAL HIGH (ref 65–99)
Potassium: 4.6 mmol/L (ref 3.5–5.2)
Sodium: 138 mmol/L (ref 134–144)

## 2018-05-10 NOTE — Telephone Encounter (Signed)
Copied from [place] [id number]. Topic: Quick Communication - See Telephone Encounter >> May 10, 2018  3:06 PM Mylinda Latina, NT wrote: CRM for notification. See Telephone encounter for: 05/10/18. Kim calling from [employer] is needing clarification on the medication insulin degludec (TRESIBA FLEXTOUCH) 100 UNIT/ML SOPN FlexTouch Pen. She states the RX was wrote for 1ml which is one pen not for a box . Please call CB# [phone].

## 2018-05-12 NOTE — Telephone Encounter (Signed)
Message sent to [facility] for clarification

## 2018-05-14 NOTE — Telephone Encounter (Signed)
Pt should receive a box of 55mL insulin pens -- same as her previous Rx. Please inform pharmacy. Thank you!

## 2018-05-14 NOTE — Telephone Encounter (Signed)
Pharmacy was informed.

## 2018-08-08 ENCOUNTER — Other Ambulatory Visit: Payer: Self-pay

## 2018-08-08 ENCOUNTER — Encounter: Payer: Self-pay | Admitting: Physician Assistant

## 2018-08-08 ENCOUNTER — Ambulatory Visit: Payer: BC Managed Care – PPO | Admitting: Physician Assistant

## 2018-08-08 VITALS — BP 133/73 | HR 81 | Temp 98.3°F | Resp 18 | Ht <= 58 in | Wt 132.6 lb

## 2018-08-08 DIAGNOSIS — Z1211 Encounter for screening for malignant neoplasm of colon: Secondary | ICD-10-CM

## 2018-08-08 DIAGNOSIS — E1165 Type 2 diabetes mellitus with hyperglycemia: Secondary | ICD-10-CM | POA: Diagnosis not present

## 2018-08-08 LAB — POCT GLYCOSYLATED HEMOGLOBIN (HGB A1C): Hemoglobin A1C: 8.5 % — AB (ref 4.0–5.6)

## 2018-08-08 NOTE — Progress Notes (Signed)
Gabrielle [name]  MRN: [id number] DOB: 07/31/1956  PCP: Dorise Hiss, PA-C  Subjective:  Pt is a 62 year old female who presents to clinic for DM f/u.  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   She is taking her metformin 1,000mg  twice per day.  She is taking the insulin at night with 20 units. She is not checking her blood sugar at home. She cannot afford test strips.  She is trying to work on her diet at home. Drinks 1 glass of sweet tea with dinner.  She is not exercising.  Admits to eating non-DM friendly diet. Grits and sausage from [place] for breakfast, [employer] for lunch, Meat and small portion of potatoes.   She has not yet made appt for DM eye exam.  Would like cologard, not colonoscopy.    Lab Results  Component Value Date   HGBA1C 7.8 (A) 05/09/2018   Wt Readings from Last 3 Encounters:  08/08/18 132 lb 9.6 oz (60.1 kg)  05/09/18 130 lb 9.6 oz (59.2 kg)  04/12/18 129 lb 12.8 oz (58.9 kg)    Review of Systems  Gastrointestinal: Negative for abdominal pain, nausea and vomiting.  Endocrine: Negative for polydipsia, polyphagia and polyuria.  Neurological: Negative for dizziness, light-headedness and headaches.    Patient Active Problem List   Diagnosis Date Noted  . Uncontrolled type 2 diabetes mellitus with hyperglycemia ([place]) 03/09/2018  . Rectal carcinoid tumor   . Sepsis due to urinary tract infection ([place]) 02/02/2015  . Essential hypertension 02/02/2015  . Atherosclerosis of native arteries of extremity with intermittent claudication ([place]) 02/02/2015  . Sepsis ([place]) 02/02/2015  . Stress incontinence in female 03/06/2014  . 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 Diabetes mellitus ([place]) 02/28/2012  . High cholesterol 02/28/2012  . Hypertension 02/28/2012    Current Outpatient Medications on File Prior to Visit  Medication Sig Dispense Refill  . amLODipine (NORVASC) 10 MG tablet Take 1  tablet (10 mg total) by mouth daily. 90 tablet 3  . aspirin 81 MG tablet Take 81 mg by mouth daily.    Marland Kitchen atorvastatin (LIPITOR) 10 MG tablet Take 1 tablet (10 mg total) by mouth every morning. 90 tablet 3  . betamethasone dipropionate (DIPROLENE) 0.05 % cream Apply topically 2 (two) times daily. 30 g 1  . glucose blood (ACCU-CHEK SMARTVIEW) test strip PATIENT NEEDS OFFICE VISIT FOR ADDITIONAL REFILLS - very overdue for DM check up. 100 each 0  . insulin degludec (TRESIBA FLEXTOUCH) 100 UNIT/ML SOPN FlexTouch Pen Inject 0.2 mLs (20 Units total) into the skin at bedtime. 3 mL 1  . lisinopril (PRINIVIL,ZESTRIL) 10 MG tablet Take 1 tablet (10 mg total) by mouth every morning. 90 tablet 3  . metFORMIN (GLUCOPHAGE) 1000 MG tablet Take 1 tablet (1,000 mg total) by mouth 2 (two) times daily with a meal. 180 tablet 3  . triamcinolone (KENALOG) 0.025 % cream Apply 1 application topically 2 (two) times daily. 30 g 1  . benzonatate (TESSALON) 100 MG capsule Take 1 capsule (100 mg total) by mouth 3 (three) times daily as needed for cough. (Patient not taking: Reported on 08/08/2018) 20 capsule 0  . HYDROcodone-homatropine (HYCODAN) 5-1.5 MG/5ML syrup 37m by mouth a bedtime as needed for cough. (Patient not taking: Reported on 08/08/2018) 120 mL 0   Current Facility-Administered Medications on File Prior to Visit  Medication Dose Route Frequency Provider Last Rate Last Dose  . 0.9 %  sodium  chloride infusion  500 mL Intravenous Continuous Armbruster, Carlota Raspberry, MD        No Known Allergies   Objective:  BP 133/73   Pulse 81   Temp 98.3 F (36.8 C) (Oral)   Resp 18   Ht 4\' 10"  (1.473 m)   Wt 132 lb 9.6 oz (60.1 kg)   SpO2 96%   BMI 27.71 kg/m   Physical Exam  Constitutional: She is oriented to person, place, and time. No distress.  Cardiovascular: Normal rate, regular rhythm and normal heart sounds.  Neurological: She is alert and oriented to person, place, and time.  Skin: Skin is warm and dry.    Psychiatric: Judgment normal.  Vitals reviewed.   Results for orders placed or performed in visit on 08/08/18  POCT glycosylated hemoglobin (Hb A1C)  Result Value Ref Range   Hemoglobin A1C 8.5 (A) 4.0 - 5.6 %   HbA1c POC (<> result, manual entry)     HbA1c, POC (prediabetic range)     HbA1c, POC (controlled diabetic range)      Assessment and Plan :  1. Uncontrolled type 2 diabetes mellitus with hyperglycemia ([facility]) - Pt presents f/u DM. In the past 6 months, A1C went from 11 to 7.8 and is now 8.5. Discussed starting walking several times/week. Follow DM-friendly diet. Make appt for DM ophthalmologist. RTC in 3 months for annual exam and A1C check.  - POCT glycosylated hemoglobin (Hb A1C)  2. Screen for colon cancer - Cologuard   Mercer Pod, PA-C  Primary Care at [place] 08/08/2018 4:52 PM  Please note: Portions of this report may have been transcribed using dragon voice recognition software. Every effort was made to ensure accuracy; however, inadvertent computerized transcription errors may be present.

## 2018-08-08 NOTE — Patient Instructions (Addendum)
Come back in December for your annual exam.    Diabetes Mellitus and Nutrition When you have diabetes (diabetes mellitus), it is very important to have healthy eating habits because your blood sugar (glucose) levels are greatly affected by what you eat and drink. Eating healthy foods in the appropriate amounts, at about the same times every day, can help you:  Control your blood glucose.  Lower your risk of heart disease.  Improve your blood pressure.  Reach or maintain a healthy weight.  Every person with diabetes is different, and each person has different needs for a meal plan. Your health care provider may recommend that you work with a diet and nutrition specialist (dietitian) to make a meal plan that is best for you. Your meal plan may vary depending on factors such as:  The calories you need.  The medicines you take.  Your weight.  Your blood glucose, blood pressure, and cholesterol levels.  Your activity level.  Other health conditions you have, such as heart or kidney disease.  How do carbohydrates affect me? Carbohydrates affect your blood glucose level more than any other type of food. Eating carbohydrates naturally increases the amount of glucose in your blood. Carbohydrate counting is a method for keeping track of how many carbohydrates you eat. Counting carbohydrates is important to keep your blood glucose at a healthy level, especially if you use insulin or take certain oral diabetes medicines. It is important to know how many carbohydrates you can safely have in each meal. This is different for every person. Your dietitian can help you calculate how many carbohydrates you should have at each meal and for snack. Foods that contain carbohydrates include:  Bread, cereal, rice, pasta, and crackers.  Potatoes and corn.  Peas, beans, and lentils.  Milk and yogurt.  Fruit and juice.  Desserts, such as cakes, cookies, ice cream, and candy.  How does alcohol affect  me? Alcohol can cause a sudden decrease in blood glucose (hypoglycemia), especially if you use insulin or take certain oral diabetes medicines. Hypoglycemia can be a life-threatening condition. Symptoms of hypoglycemia (sleepiness, dizziness, and confusion) are similar to symptoms of having too much alcohol. If your health care provider says that alcohol is safe for you, follow these guidelines:  Limit alcohol intake to no more than 1 drink per day for nonpregnant women and 2 drinks per day for men. One drink equals 12 oz of beer, 5 oz of wine, or 1 oz of hard liquor.  Do not drink on an empty stomach.  Keep yourself hydrated with water, diet soda, or unsweetened iced tea.  Keep in mind that regular soda, juice, and other mixers may contain a lot of sugar and must be counted as carbohydrates.  What are tips for following this plan? Reading food labels  Start by checking the serving size on the label. The amount of calories, carbohydrates, fats, and other nutrients listed on the label are based on one serving of the food. Many foods contain more than one serving per package.  Check the total grams (g) of carbohydrates in one serving. You can calculate the number of servings of carbohydrates in one serving by dividing the total carbohydrates by 15. For example, if a food has 30 g of total carbohydrates, it would be equal to 2 servings of carbohydrates.  Check the number of grams (g) of saturated and trans fats in one serving. Choose foods that have low or no amount of these fats.  Check the  number of milligrams (mg) of sodium in one serving. Most people should limit total sodium intake to less than 2,300 mg per day.  Always check the nutrition information of foods labeled as "low-fat" or "nonfat". These foods may be higher in added sugar or refined carbohydrates and should be avoided.  Talk to your dietitian to identify your daily goals for nutrients listed on the label. Shopping  Avoid  buying canned, premade, or processed foods. These foods tend to be high in fat, sodium, and added sugar.  Shop around the outside edge of the grocery store. This includes fresh fruits and vegetables, bulk grains, fresh meats, and fresh dairy. Cooking  Use low-heat cooking methods, such as baking, instead of high-heat cooking methods like deep frying.  Cook using healthy oils, such as olive, canola, or sunflower oil.  Avoid cooking with butter, cream, or high-fat meats. Meal planning  Eat meals and snacks regularly, preferably at the same times every day. Avoid going long periods of time without eating.  Eat foods high in fiber, such as fresh fruits, vegetables, beans, and whole grains. Talk to your dietitian about how many servings of carbohydrates you can eat at each meal.  Eat 4-6 ounces of lean protein each day, such as lean meat, chicken, fish, eggs, or tofu. 1 ounce is equal to 1 ounce of meat, chicken, or fish, 1 egg, or 1/4 cup of tofu.  Eat some foods each day that contain healthy fats, such as avocado, nuts, seeds, and fish. Lifestyle   Check your blood glucose regularly.  Exercise at least 30 minutes 5 or more days each week, or as told by your health care provider.  Take medicines as told by your health care provider.  Do not use any products that contain nicotine or tobacco, such as cigarettes and e-cigarettes. If you need help quitting, ask your health care provider.  Work with a Social worker or diabetes educator to identify strategies to manage stress and any emotional and social challenges. What are some questions to ask my health care provider?  Do I need to meet with a diabetes educator?  Do I need to meet with a dietitian?  What number can I call if I have questions?  When are the best times to check my blood glucose? Where to find more information:  American Diabetes Association: diabetes.org/food-and-fitness/food  Academy of Nutrition and Dietetics:  PokerClues.dk  [employer] of Diabetes and Digestive and Kidney Diseases (NIH): ContactWire.be Summary  A healthy meal plan will help you control your blood glucose and maintain a healthy lifestyle.  Working with a diet and nutrition specialist (dietitian) can help you make a meal plan that is best for you.  Keep in mind that carbohydrates and alcohol have immediate effects on your blood glucose levels. It is important to count carbohydrates and to use alcohol carefully. This information is not intended to replace advice given to you by your health care provider. Make sure you discuss any questions you have with your health care provider. Document Released: 07/28/2005 Document Revised: 12/05/2016 Document Reviewed: 12/05/2016 Elsevier Interactive Patient Education  2018 [employer].   IF you received an x-ray today, you will receive an invoice from [facility]. Please contact [facility] at [phone] with questions or concerns regarding your invoice.   IF you received labwork today, you will receive an invoice from [facility]. Please contact [facility] at [phone] with questions or concerns regarding your invoice.   Our billing staff will not be able to assist  you with questions regarding bills from these companies.  You will be contacted with the lab results as soon as they are available. The fastest way to get your results is to activate your My Chart account. Instructions are located on the last page of this paperwork. If you have not heard from [place] regarding the results in 2 weeks, please contact this office.

## 2018-09-04 LAB — HM MAMMOGRAPHY

## 2018-09-13 LAB — COLOGUARD: Cologuard: NEGATIVE

## 2018-09-17 NOTE — Progress Notes (Signed)
Negative cologuard. Results released to mychart. Next testing in 3 years.

## 2018-10-18 ENCOUNTER — Other Ambulatory Visit: Payer: Self-pay | Admitting: Physician Assistant

## 2018-10-18 DIAGNOSIS — IMO0001 Reserved for inherently not codable concepts without codable children: Secondary | ICD-10-CM

## 2018-10-18 DIAGNOSIS — E119 Type 2 diabetes mellitus without complications: Principal | ICD-10-CM

## 2018-10-18 DIAGNOSIS — Z794 Long term (current) use of insulin: Principal | ICD-10-CM

## 2018-11-02 ENCOUNTER — Ambulatory Visit (INDEPENDENT_AMBULATORY_CARE_PROVIDER_SITE_OTHER): Payer: BC Managed Care – PPO | Admitting: Family Medicine

## 2018-11-02 ENCOUNTER — Encounter: Payer: Self-pay | Admitting: Family Medicine

## 2018-11-02 VITALS — BP 137/66 | HR 86 | Temp 98.3°F | Resp 16 | Ht <= 58 in | Wt 130.4 lb

## 2018-11-02 DIAGNOSIS — Z90711 Acquired absence of uterus with remaining cervical stump: Secondary | ICD-10-CM

## 2018-11-02 DIAGNOSIS — E1165 Type 2 diabetes mellitus with hyperglycemia: Secondary | ICD-10-CM | POA: Diagnosis not present

## 2018-11-02 DIAGNOSIS — Z0001 Encounter for general adult medical examination with abnormal findings: Secondary | ICD-10-CM | POA: Diagnosis not present

## 2018-11-02 DIAGNOSIS — Z130 Encounter for screening for diseases of the blood and blood-forming organs and certain disorders involving the immune mechanism: Secondary | ICD-10-CM | POA: Diagnosis not present

## 2018-11-02 DIAGNOSIS — I10 Essential (primary) hypertension: Secondary | ICD-10-CM | POA: Diagnosis not present

## 2018-11-02 DIAGNOSIS — E7800 Pure hypercholesterolemia, unspecified: Secondary | ICD-10-CM | POA: Diagnosis not present

## 2018-11-02 DIAGNOSIS — Z0000 Encounter for general adult medical examination without abnormal findings: Secondary | ICD-10-CM

## 2018-11-02 DIAGNOSIS — Z794 Long term (current) use of insulin: Secondary | ICD-10-CM

## 2018-11-02 MED ORDER — LISINOPRIL 10 MG PO TABS: 10.0000 mg | ORAL_TABLET | Freq: Every morning | ORAL | 3 refills | Status: DC

## 2018-11-02 MED ORDER — ATORVASTATIN CALCIUM 10 MG PO TABS: 10.0000 mg | ORAL_TABLET | Freq: Every morning | ORAL | 3 refills | Status: DC

## 2018-11-02 MED ORDER — INSULIN DEGLUDEC 100 UNIT/ML ~~LOC~~ SOPN: PEN_INJECTOR | SUBCUTANEOUS | 1 refills | Status: DC

## 2018-11-02 MED ORDER — METFORMIN HCL 1000 MG PO TABS: 1000.0000 mg | ORAL_TABLET | Freq: Two times a day (BID) | ORAL | 3 refills | Status: DC

## 2018-11-02 MED ORDER — AMLODIPINE BESYLATE 10 MG PO TABS: 10.0000 mg | ORAL_TABLET | Freq: Every day | ORAL | 3 refills | Status: DC

## 2018-11-02 NOTE — Progress Notes (Signed)
12/20/20194:04 PM  Gabrielle [name] 11-Jul-1956, 62 y.o. female [id number]  Chief Complaint  Patient presents with  . Annual Exam    HPI:   Patient is a 62 y.o. female with past medical history significant for HTN, DM2, HLP who presents today for CPE  Last CPE 11/13/17 Cervical Cancer Screening: n/a, reports partial hysterectomy, ovaries remain Breast Cancer Screening: 02/2017 Colorectal Cancer Screening: 01/2017, h/o rectal carcinoid cancer Bone Density Testing: at age 62 Seasonal Influenza Vaccination: declines Td/Tdap Vaccination: declines Pneumococcal Vaccination: 2015 Zoster Vaccination: declienes Frequency of Dental evaluation: wears denture Frequency of Eye evaluation: has not seen, wears eyeglasses  tresiba 20 units once a day Metformin 1095m twice a day Has not been checking cbgs Denies any low cbgs  Lab Results  Component Value Date   HGBA1C 8.5 (A) 08/08/2018   HGBA1C 7.8 (A) 05/09/2018   HGBA1C 11.0 02/05/2018   Lab Results  Component Value Date   MICROALBUR 1.2 11/05/2014   LDLCALC 132 (H) 11/13/2017   CREATININE 0.81 05/09/2018    Fall Risk  11/02/2018 04/12/2018 03/09/2018 02/05/2018 11/13/2017  Falls in the past year? 0 No No No No     [facility] 11/02/2018 04/12/2018 03/09/2018  Decreased Interest 0 0 0  Down, Depressed, Hopeless 0 0 0  PHQ - 2 Score 0 0 0    No Known Allergies  Prior to Admission medications   Medication Sig Start Date End Date Taking? Authorizing Provider  amLODipine (NORVASC) 10 MG tablet Take 1 tablet (10 mg total) by mouth daily. 11/13/17  Yes EIvar DrapeD, PA  aspirin 81 MG tablet Take 81 mg by mouth daily.   Yes [provider]  atorvastatin (LIPITOR) 10 MG tablet Take 1 tablet (10 mg total) by mouth every morning. 11/13/17  Yes English, SColletta MarylandD, PA  betamethasone dipropionate (DIPROLENE) 0.05 % cream Apply topically 2 (two) times daily. 11/13/17  Yes English, SColletta MarylandD, PA    glucose blood (ACCU-CHEK SMARTVIEW) test strip PATIENT NEEDS OFFICE VISIT FOR ADDITIONAL REFILLS - very overdue for DM check up. 11/11/15  Yes English, SColletta MarylandD, PA  lisinopril (PRINIVIL,ZESTRIL) 10 MG tablet Take 1 tablet (10 mg total) by mouth every morning. 11/13/17  Yes EIvar DrapeD, PA  metFORMIN (GLUCOPHAGE) 1000 MG tablet Take 1 tablet (1,000 mg total) by mouth 2 (two) times daily with a meal. 11/13/17  Yes English, Stephanie D, PA  TRESIBA FLEXTOUCH 100 UNIT/ML SOPN FlexTouch Pen  INJECT 0.2 MLS (20 UNITS TOTAL) INTO THE SKIN AT BEDTIME 10/18/18  Yes McVey, EGelene Mink PA-C  triamcinolone (KENALOG) 0.025 % cream Apply 1 application topically 2 (two) times daily. 11/13/17  Yes EJoretta Bachelor PA    Past Medical History:  Diagnosis Date  . [place]    rectal CA  . Cataract   . Eczema   . Hyperlipidemia   . Hypertension   . Intrinsic (urethral) sphincter deficiency (ISD)   . RBBB   . SUI (stress urinary incontinence, female)    hx of  . [place] 2 diabetes mellitus [place]   . Wears dentures    UPPER  . Wears glasses     Past Surgical History:  Procedure Laterality Date  . ABDOMINAL HYSTERECTOMY  1998   fibroids, partial  . ABDOMINOPLASTY  1999  . BLADDER SUSPENSION N/A 03/06/2014   [facility] ;  Surgeon: JIrine Seal MD;  [facility]  Service: Urology;  Laterality: N/A;  . CARDIOVASCULAR  STRESS TEST  03-27-2008   NORMAL PERFUSION STUDY/  EF 54%  . COLON SURGERY  2010   for rectal ca  . COLONOSCOPY    . EUS N/A 03/02/2017   Procedure: LOWER ENDOSCOPIC ULTRASOUND (EUS);  Surgeon: Milus Banister, MD;  Location: [facility];  Service: Endoscopy;  Laterality: N/A;  . EXCISION LOWER ABDOMINAL SCAR POST ABDOMINOPLASTY  01-05-2000  . ORIF RIGHT ANKLE FX  12-24-2008  . RIGHT ANKLE ARTHROSCOPY W/ DEBRIDEMENT AND OPEN REMOVAL HARDWARE  03-12-2010  . TRANSTHORACIC ECHOCARDIOGRAM  08-05-2009   MILD LVH/  EF 60-65%  .  TUBAL LIGATION      Social History   Tobacco Use  . Smoking status: Former Smoker    Years: 20.00    Types: Cigarettes    Last attempt to quit: 11/14/2006    Years since quitting: 11.9  . Smokeless tobacco: Never Used  Substance Use Topics  . Alcohol use: No    Family History  Problem Relation Age of Onset  . Diabetes Mother   . Hypertension Mother   . Diabetes Sister   . Hypertension Sister   . Diabetes Brother   . Hypertension Brother   . Lupus Daughter   . Colon cancer Neg Hx   . Esophageal cancer Neg Hx   . Rectal cancer Neg Hx   . Stomach cancer Neg Hx     Review of Systems  Constitutional: Negative for chills and fever.  Respiratory: Negative for cough and shortness of breath.   Cardiovascular: Negative for chest pain, palpitations and leg swelling.  Gastrointestinal: Negative for abdominal pain, nausea and vomiting.  All other systems reviewed and are negative.    OBJECTIVE:  Blood pressure 137/66, pulse 86, temperature 98.3 F (36.8 C), temperature source Oral, resp. rate 16, height 4' 10"  (1.473 m), weight 130 lb 6.4 oz (59.1 kg), SpO2 96 %. Body mass index is 27.25 kg/m.   Physical Exam Vitals signs and nursing note reviewed.  Constitutional:      Appearance: She is well-developed.  HENT:     Head: Normocephalic and atraumatic.     Right Ear: Hearing, tympanic membrane, ear canal and external ear normal.     Left Ear: Hearing, tympanic membrane, ear canal and external ear normal.  Eyes:     Conjunctiva/sclera: Conjunctivae normal.     Pupils: Pupils are equal, round, and reactive to light.  Neck:     Musculoskeletal: Neck supple.     Thyroid: No thyromegaly.  Cardiovascular:     Rate and Rhythm: Normal rate and regular rhythm.     Heart sounds: Normal heart sounds. No murmur. No friction rub. No gallop.   Pulmonary:     Effort: Pulmonary effort is normal.     Breath sounds: Normal breath sounds. No wheezing or rales.  Abdominal:     General:  Bowel sounds are normal. There is no distension.     Palpations: Abdomen is soft. There is no mass.     Tenderness: There is no abdominal tenderness.  Musculoskeletal: Normal range of motion.  Lymphadenopathy:     Cervical: No cervical adenopathy.  Skin:    General: Skin is warm and dry.  Neurological:     Mental Status: She is alert and oriented to person, place, and time.     Cranial Nerves: No cranial nerve deficit.     Gait: Gait normal.     Deep Tendon Reflexes: Reflexes are normal and symmetric.     ASSESSMENT and  PLAN  1. Annual physical exam No concerns per history or exam. Routine HCM labs ordered. HCM reviewed/discussed. Anticipatory guidance regarding healthy weight, lifestyle and choices given. She declines recommended vaccines with informed consent  - amLODipine (NORVASC) 10 MG tablet; Take 1 tablet (10 mg total) by mouth daily. - atorvastatin (LIPITOR) 10 MG tablet; Take 1 tablet (10 mg total) by mouth every morning. - lisinopril (PRINIVIL,ZESTRIL) 10 MG tablet; Take 1 tablet (10 mg total) by mouth every morning. - metFORMIN (GLUCOPHAGE) 1000 MG tablet; Take 1 tablet (1,000 mg total) by mouth 2 (two) times daily with a meal.  2. Type 2 diabetes mellitus with hyperglycemia, with long-term current use of insulin (HCC) Last a1c uncontrolled. Rechecking today. Discussed options for SGLT2 vs DDP4, r/se/b reviewed. Pending a1c. Referring to ophtho for routine eye care - CMP14+EGFR - TSH - Ambulatory referral to Ophthalmology - Hemoglobin A1c - Microalbumin/Creatinine Ratio, Urine - metFORMIN (GLUCOPHAGE) 1000 MG tablet; Take 1 tablet (1,000 mg total) by mouth 2 (two) times daily with a meal. - insulin degludec (TRESIBA FLEXTOUCH) 100 UNIT/ML SOPN FlexTouch Pen; INJECT 0.2 MLS (20 UNITS TOTAL) INTO THE SKIN AT BEDTIME  3. Essential hypertension Controlled. Continue current regime.  - amLODipine (NORVASC) 10 MG tablet; Take 1 tablet (10 mg total) by mouth daily. -  lisinopril (PRINIVIL,ZESTRIL) 10 MG tablet; Take 1 tablet (10 mg total) by mouth every morning.  4. Pure hypercholesterolemia Checking labs today, medications will be adjusted as needed.  - Lipid panel - atorvastatin (LIPITOR) 10 MG tablet; Take 1 tablet (10 mg total) by mouth every morning.  5. Screening for deficiency anemia - CBC  6. S/P partial hysterectomy No pap indicated   Return in about 3 months (around 02/01/2019).    Rutherford Guys, MD [facility] [place], [place] [address] Ph.  [phone] Fax [phone]

## 2018-11-02 NOTE — Patient Instructions (Addendum)
Health Maintenance, Female Adopting a healthy lifestyle and getting preventive care can go a long way to promote health and wellness. Talk with your health care provider about what schedule of regular examinations is right for you. This is a good chance for you to check in with your provider about disease prevention and staying healthy. In between checkups, there are plenty of things you can do on your own. Experts have done a lot of research about which lifestyle changes and preventive measures are most likely to keep you healthy. Ask your health care provider for more information. Weight and diet Eat a healthy diet  Be sure to include plenty of vegetables, fruits, low-fat dairy products, and lean protein.  Do not eat a lot of foods high in solid fats, added sugars, or salt.  Get regular exercise. This is one of the most important things you can do for your health. ? Most adults should exercise for at least 150 minutes each week. The exercise should increase your heart rate and make you sweat (moderate-intensity exercise). ? Most adults should also do strengthening exercises at least twice a week. This is in addition to the moderate-intensity exercise. Maintain a healthy weight  Body mass index (BMI) is a measurement that can be used to identify possible weight problems. It estimates body fat based on height and weight. Your health care provider can help determine your BMI and help you achieve or maintain a healthy weight.  For females 5 years of age and older: ? A BMI below 18.5 is considered underweight. ? A BMI of 18.5 to 24.9 is normal. ? A BMI of 25 to 29.9 is considered overweight. ? A BMI of 30 and above is considered obese. Watch levels of cholesterol and blood lipids  You should start having your blood tested for lipids and cholesterol at 61 years of age, then have this test every 5 years.  You may need to have your cholesterol levels checked more often if: ? Your lipid or  cholesterol levels are high. ? You are older than 61 years of age. ? You are at high risk for heart disease. Cancer screening Lung Cancer  Lung cancer screening is recommended for adults 48-79 years old who are at high risk for lung cancer because of a history of smoking.  A yearly low-dose CT scan of the lungs is recommended for people who: ? Currently smoke. ? Have quit within the past 15 years. ? Have at least a 30-pack-year history of smoking. A pack year is smoking an average of one pack of cigarettes a day for 1 year.  Yearly screening should continue until it has been 15 years since you quit.  Yearly screening should stop if you develop a health problem that would prevent you from having lung cancer treatment. Breast Cancer  Practice breast self-awareness. This means understanding how your breasts normally appear and feel.  It also means doing regular breast self-exams. Let your health care provider know about any changes, no matter how small.  If you are in your 20s or 30s, you should have a clinical breast exam (CBE) by a health care provider every 1-3 years as part of a regular health exam.  If you are 22 or older, have a CBE every year. Also consider having a breast X-ray (mammogram) every year.  If you have a family history of breast cancer, talk to your health care provider about genetic screening.  If you are at high risk for breast cancer, talk  to your health care provider about having an MRI and a mammogram every year.  Breast cancer gene (BRCA) assessment is recommended for women who have family members with BRCA-related cancers. BRCA-related cancers include: ? Breast. ? Ovarian. ? Tubal. ? Peritoneal cancers.  Results of the assessment will determine the need for genetic counseling and BRCA1 and BRCA2 testing. Cervical Cancer Your health care provider may recommend that you be screened regularly for cancer of the pelvic organs (ovaries, uterus, and vagina).  This screening involves a pelvic examination, including checking for microscopic changes to the surface of your cervix (Pap test). You may be encouraged to have this screening done every 3 years, beginning at age 21.  For women ages 30-65, health care providers may recommend pelvic exams and Pap testing every 3 years, or they may recommend the Pap and pelvic exam, combined with testing for human papilloma virus (HPV), every 5 years. Some types of HPV increase your risk of cervical cancer. Testing for HPV may also be done on women of any age with unclear Pap test results.  Other health care providers may not recommend any screening for nonpregnant women who are considered low risk for pelvic cancer and who do not have symptoms. Ask your health care provider if a screening pelvic exam is right for you.  If you have had past treatment for cervical cancer or a condition that could lead to cancer, you need Pap tests and screening for cancer for at least 20 years after your treatment. If Pap tests have been discontinued, your risk factors (such as having a new sexual partner) need to be reassessed to determine if screening should resume. Some women have medical problems that increase the chance of getting cervical cancer. In these cases, your health care provider may recommend more frequent screening and Pap tests. Colorectal Cancer  This type of cancer can be detected and often prevented.  Routine colorectal cancer screening usually begins at 61 years of age and continues through 61 years of age.  Your health care provider may recommend screening at an earlier age if you have risk factors for colon cancer.  Your health care provider may also recommend using home test kits to check for hidden blood in the stool.  A small camera at the end of a tube can be used to examine your colon directly (sigmoidoscopy or colonoscopy). This is done to check for the earliest forms of colorectal cancer.  Routine  screening usually begins at age 50.  Direct examination of the colon should be repeated every 5-10 years through 61 years of age. However, you may need to be screened more often if early forms of precancerous polyps or small growths are found. Skin Cancer  Check your skin from head to toe regularly.  Tell your health care provider about any new moles or changes in moles, especially if there is a change in a mole's shape or color.  Also tell your health care provider if you have a mole that is larger than the size of a pencil eraser.  Always use sunscreen. Apply sunscreen liberally and repeatedly throughout the day.  Protect yourself by wearing long sleeves, pants, a wide-brimmed hat, and sunglasses whenever you are outside. Heart disease, diabetes, and high blood pressure  High blood pressure causes heart disease and increases the risk of stroke. High blood pressure is more likely to develop in: ? People who have blood pressure in the high end of the normal range (130-139/85-89 mm Hg). ? People   who are overweight or obese. ? People who are African American.  If you are 84-22 years of age, have your blood pressure checked every 3-5 years. If you are 67 years of age or older, have your blood pressure checked every year. You should have your blood pressure measured twice-once when you are at a hospital or clinic, and once when you are not at a hospital or clinic. Record the average of the two measurements. To check your blood pressure when you are not at a hospital or clinic, you can use: ? An automated blood pressure machine at a pharmacy. ? A home blood pressure monitor.  If you are between 52 years and 3 years old, ask your health care provider if you should take aspirin to prevent strokes.  Have regular diabetes screenings. This involves taking a blood sample to check your fasting blood sugar level. ? If you are at a normal weight and have a low risk for diabetes, have this test once  every three years after 61 years of age. ? If you are overweight and have a high risk for diabetes, consider being tested at a younger age or more often. Preventing infection Hepatitis B  If you have a higher risk for hepatitis B, you should be screened for this virus. You are considered at high risk for hepatitis B if: ? You were born in a country where hepatitis B is common. Ask your health care provider which countries are considered high risk. ? Your parents were born in a high-risk country, and you have not been immunized against hepatitis B (hepatitis B vaccine). ? You have HIV or AIDS. ? You use needles to inject street drugs. ? You live with someone who has hepatitis B. ? You have had sex with someone who has hepatitis B. ? You get hemodialysis treatment. ? You take certain medicines for conditions, including cancer, organ transplantation, and autoimmune conditions. Hepatitis C  Blood testing is recommended for: ? Everyone born from [birth year] through 1964. ? Anyone with known risk factors for hepatitis C. Sexually transmitted infections (STIs)  You should be screened for sexually transmitted infections (STIs) including gonorrhea and chlamydia if: ? You are sexually active and are younger than 61 years of age. ? You are older than 61 years of age and your health care provider tells you that you are at risk for this type of infection. ? Your sexual activity has changed since you were last screened and you are at an increased risk for chlamydia or gonorrhea. Ask your health care provider if you are at risk.  If you do not have HIV, but are at risk, it may be recommended that you take a prescription medicine daily to prevent HIV infection. This is called pre-exposure prophylaxis (PrEP). You are considered at risk if: ? You are sexually active and do not regularly use condoms or know the HIV status of your partner(s). ? You take drugs by injection. ? You are sexually active with a partner  who has HIV. Talk with your health care provider about whether you are at high risk of being infected with HIV. If you choose to begin PrEP, you should first be tested for HIV. You should then be tested every 3 months for as long as you are taking PrEP. Pregnancy  If you are premenopausal and you may become pregnant, ask your health care provider about preconception counseling.  If you may become pregnant, take 400 to 800 micrograms (mcg) of folic acid every  day.  If you want to prevent pregnancy, talk to your health care provider about birth control (contraception). Osteoporosis and menopause  Osteoporosis is a disease in which the bones lose minerals and strength with aging. This can result in serious bone fractures. Your risk for osteoporosis can be identified using a bone density scan.  If you are 105 years of age or older, or if you are at risk for osteoporosis and fractures, ask your health care provider if you should be screened.  Ask your health care provider whether you should take a calcium or vitamin D supplement to lower your risk for osteoporosis.  Menopause may have certain physical symptoms and risks.  Hormone replacement therapy may reduce some of these symptoms and risks. Talk to your health care provider about whether hormone replacement therapy is right for you. Follow these instructions at home:  Schedule regular health, dental, and eye exams.  Stay current with your immunizations.  Do not use any tobacco products including cigarettes, chewing tobacco, or electronic cigarettes.  If you are pregnant, do not drink alcohol.  If you are breastfeeding, limit how much and how often you drink alcohol.  Limit alcohol intake to no more than 1 drink per day for nonpregnant women. One drink equals 12 ounces of beer, 5 ounces of wine, or 1 ounces of hard liquor.  Do not use street drugs.  Do not share needles.  Ask your health care provider for help if you need support  or information about quitting drugs.  Tell your health care provider if you often feel depressed.  Tell your health care provider if you have ever been abused or do not feel safe at home. This information is not intended to replace advice given to you by your health care provider. Make sure you discuss any questions you have with your health care provider. Document Released: 05/16/2011 Document Revised: 04/07/2016 Document Reviewed: 08/04/2015 Elsevier Interactive Patient Education  [employer].    If you have lab work done today you will be contacted with your lab results within the next 2 weeks.  If you have not heard from [place] then please contact us. The fastest way to get your results is to register for My Chart.   IF you received an x-ray today, you will receive an invoice from [facility]. Please contact [facility] at [phone] with questions or concerns regarding your invoice.   IF you received labwork today, you will receive an invoice from [facility]. Please contact [facility] at [phone] with questions or concerns regarding your invoice.   Our billing staff will not be able to assist you with questions regarding bills from these companies.  You will be contacted with the lab results as soon as they are available. The fastest way to get your results is to activate your My Chart account. Instructions are located on the last page of this paperwork. If you have not heard from [place] regarding the results in 2 weeks, please contact this office.      Preventive Care 40-64 Years, Female Preventive care refers to lifestyle choices and visits with your health care provider that can promote health and wellness. What does preventive care include?   A yearly physical exam. This is also called an annual well check.  Dental exams once or twice a year.  Routine eye exams. Ask your health care provider how often you should have your eyes checked.  Personal  lifestyle choices, including: ? Daily care of your teeth and  gums. ? Regular physical activity. ? Eating a healthy diet. ? Avoiding tobacco and drug use. ? Limiting alcohol use. ? Practicing safe sex. ? Taking low-dose aspirin daily starting at age 44. ? Taking vitamin and mineral supplements as recommended by your health care provider. What happens during an annual well check? The services and screenings done by your health care provider during your annual well check will depend on your age, overall health, lifestyle risk factors, and family history of disease. Counseling Your health care provider may ask you questions about your:  Alcohol use.  Tobacco use.  Drug use.  Emotional well-being.  Home and relationship well-being.  Sexual activity.  Eating habits.  Work and work Statistician.  Method of birth control.  Menstrual cycle.  Pregnancy history. Screening You may have the following tests or measurements:  Height, weight, and BMI.  Blood pressure.  Lipid and cholesterol levels. These may be checked every 5 years, or more frequently if you are over 24 years old.  Skin check.  Lung cancer screening. You may have this screening every year starting at age 11 if you have a 30-pack-year history of smoking and currently smoke or have quit within the past 15 years.  Colorectal cancer screening. All adults should have this screening starting at age 74 and continuing until age 33. Your health care provider may recommend screening at age 37. You will have tests every 1-10 years, depending on your results and the type of screening test. People at increased risk should start screening at an earlier age. Screening tests may include: ? Guaiac-based fecal occult blood testing. ? Fecal immunochemical test (FIT). ? Stool DNA test. ? Virtual colonoscopy. ? Sigmoidoscopy. During this test, a flexible tube with a tiny camera (sigmoidoscope) is used to examine your rectum and lower  colon. The sigmoidoscope is inserted through your anus into your rectum and lower colon. ? Colonoscopy. During this test, a long, thin, flexible tube with a tiny camera (colonoscope) is used to examine your entire colon and rectum.  Hepatitis C blood test.  Hepatitis B blood test.  Sexually transmitted disease (STD) testing.  Diabetes screening. This is done by checking your blood sugar (glucose) after you have not eaten for a while (fasting). You may have this done every 1-3 years.  Mammogram. This may be done every 1-2 years. Talk to your health care provider about when you should start having regular mammograms. This may depend on whether you have a family history of breast cancer.  BRCA-related cancer screening. This may be done if you have a family history of breast, ovarian, tubal, or peritoneal cancers.  Pelvic exam and Pap test. This may be done every 3 years starting at age 24. Starting at age 40, this may be done every 5 years if you have a Pap test in combination with an HPV test.  Bone density scan. This is done to screen for osteoporosis. You may have this scan if you are at high risk for osteoporosis. Discuss your test results, treatment options, and if necessary, the need for more tests with your health care provider. Vaccines Your health care provider may recommend certain vaccines, such as:  Influenza vaccine. This is recommended every year.  Tetanus, diphtheria, and acellular pertussis (Tdap, Td) vaccine. You may need a Td booster every 10 years.  Varicella vaccine. You may need this if you have not been vaccinated.  Zoster vaccine. You may need this after age 54.  Measles, mumps, and rubella (MMR) vaccine.  You may need at least one dose of MMR if you were born in 1957 or later. You may also need a second dose.  Pneumococcal 13-valent conjugate (PCV13) vaccine. You may need this if you have certain conditions and were not previously vaccinated.  Pneumococcal  polysaccharide (PPSV23) vaccine. You may need one or two doses if you smoke cigarettes or if you have certain conditions.  Meningococcal vaccine. You may need this if you have certain conditions.  Hepatitis A vaccine. You may need this if you have certain conditions or if you travel or work in places where you may be exposed to hepatitis A.  Hepatitis B vaccine. You may need this if you have certain conditions or if you travel or work in places where you may be exposed to hepatitis B.  Haemophilus influenzae type b (Hib) vaccine. You may need this if you have certain conditions. Talk to your health care provider about which screenings and vaccines you need and how often you need them. This information is not intended to replace advice given to you by your health care provider. Make sure you discuss any questions you have with your health care provider. Document Released: 11/27/2015 Document Revised: 12/21/2017 Document Reviewed: 09/01/2015 Elsevier Interactive Patient Education  2019 [employer].

## 2018-11-03 LAB — CBC
Hematocrit: 41.8 % (ref 34.0–46.6)
Hemoglobin: 13.6 g/dL (ref 11.1–15.9)
MCH: 28.6 pg (ref 26.6–33.0)
MCHC: 32.5 g/dL (ref 31.5–35.7)
MCV: 88 fL (ref 79–97)
Platelets: 325 10*3/uL (ref 150–450)
RBC: 4.76 x10E6/uL (ref 3.77–5.28)
RDW: 12 % — ABNORMAL LOW (ref 12.3–15.4)
WBC: 8.2 10*3/uL (ref 3.4–10.8)

## 2018-11-03 LAB — LIPID PANEL
Chol/HDL Ratio: 5.2 ratio — ABNORMAL HIGH (ref 0.0–4.4)
Cholesterol, Total: 182 mg/dL (ref 100–199)
HDL: 35 mg/dL — ABNORMAL LOW (ref >39)
LDL Calculated: 108 mg/dL — ABNORMAL HIGH (ref 0–99)
Triglycerides: 197 mg/dL — ABNORMAL HIGH (ref 0–149)
VLDL Cholesterol Cal: 39 mg/dL (ref 5–40)

## 2018-11-03 LAB — HEMOGLOBIN A1C
Est. average glucose Bld gHb Est-mCnc: 280 mg/dL
Hgb A1c MFr Bld: 11.4 % — ABNORMAL HIGH (ref 4.8–5.6)

## 2018-11-03 LAB — CMP14+EGFR
ALT: 14 IU/L (ref 0–32)
AST: 13 IU/L (ref 0–40)
Albumin/Globulin Ratio: 1.3 (ref 1.2–2.2)
Albumin: 4.4 g/dL (ref 3.6–4.8)
Alkaline Phosphatase: 105 IU/L (ref 39–117)
BUN/Creatinine Ratio: 16 (ref 12–28)
BUN: 13 mg/dL (ref 8–27)
Bilirubin Total: 0.3 mg/dL (ref 0.0–1.2)
CO2: 17 mmol/L — ABNORMAL LOW (ref 20–29)
Calcium: 9.9 mg/dL (ref 8.7–10.3)
Chloride: 98 mmol/L (ref 96–106)
Creatinine, Ser: 0.83 mg/dL (ref 0.57–1.00)
GFR calc Af Amer: 88 mL/min/{1.73_m2} (ref >59)
GFR calc non Af Amer: 76 mL/min/{1.73_m2} (ref >59)
Globulin, Total: 3.4 g/dL (ref 1.5–4.5)
Glucose: 264 mg/dL — ABNORMAL HIGH (ref 65–99)
Potassium: 4.3 mmol/L (ref 3.5–5.2)
Sodium: 137 mmol/L (ref 134–144)
Total Protein: 7.8 g/dL (ref 6.0–8.5)

## 2018-11-03 LAB — MICROALBUMIN / CREATININE URINE RATIO
Creatinine, Urine: 61 mg/dL
Microalb/Creat Ratio: 58.9 mg/g creat — ABNORMAL HIGH (ref 0.0–30.0)
Microalbumin, Urine: 35.9 ug/mL

## 2018-11-03 LAB — TSH: TSH: 0.84 u[IU]/mL (ref 0.450–4.500)

## 2018-11-13 MED ORDER — DAPAGLIFLOZIN PROPANEDIOL 10 MG PO TABS: 10.0000 mg | ORAL_TABLET | Freq: Every day | ORAL | 11 refills | Status: DC

## 2018-11-13 NOTE — Addendum Note (Signed)
Addended by: Rutherford Guys on: 11/13/2018 02:57 PM   Modules accepted: Orders

## 2018-11-16 NOTE — Progress Notes (Signed)
NORMAL

## 2018-12-07 LAB — HM DIABETES EYE EXAM

## 2018-12-15 NOTE — Progress Notes (Signed)
repeatt in one yr

## 2019-02-01 ENCOUNTER — Ambulatory Visit (INDEPENDENT_AMBULATORY_CARE_PROVIDER_SITE_OTHER): Payer: BC Managed Care – PPO | Admitting: Family Medicine

## 2019-02-01 ENCOUNTER — Other Ambulatory Visit: Payer: Self-pay

## 2019-02-01 ENCOUNTER — Encounter: Payer: Self-pay | Admitting: Family Medicine

## 2019-02-01 VITALS — BP 128/70 | HR 77 | Temp 98.3°F | Resp 18 | Ht 58.23 in | Wt 129.2 lb

## 2019-02-01 DIAGNOSIS — I10 Essential (primary) hypertension: Secondary | ICD-10-CM

## 2019-02-01 DIAGNOSIS — E1165 Type 2 diabetes mellitus with hyperglycemia: Secondary | ICD-10-CM

## 2019-02-01 DIAGNOSIS — Z794 Long term (current) use of insulin: Secondary | ICD-10-CM | POA: Diagnosis not present

## 2019-02-01 DIAGNOSIS — E7800 Pure hypercholesterolemia, unspecified: Secondary | ICD-10-CM | POA: Diagnosis not present

## 2019-02-01 LAB — POCT GLYCOSYLATED HEMOGLOBIN (HGB A1C): Hemoglobin A1C: 7.8 % — AB (ref 4.0–5.6)

## 2019-02-01 MED ORDER — DAPAGLIFLOZIN PROPANEDIOL 10 MG PO TABS: 10.0000 mg | ORAL_TABLET | Freq: Every day | ORAL | 11 refills | Status: DC

## 2019-02-01 MED ORDER — ATORVASTATIN CALCIUM 20 MG PO TABS: 20.0000 mg | ORAL_TABLET | Freq: Every morning | ORAL | 3 refills | Status: DC

## 2019-02-01 NOTE — Patient Instructions (Signed)
° ° ° °  If you have lab work done today you will be contacted with your lab results within the next 2 weeks.  If you have not heard from us then please contact us. The fastest way to get your results is to register for My Chart. ° ° °IF you received an x-ray today, you will receive an invoice from [facility]. Please contact [facility] at [phone] with questions or concerns regarding your invoice.  ° °IF you received labwork today, you will receive an invoice from [facility]. Please contact [facility] at [phone] with questions or concerns regarding your invoice.  ° °Our billing staff will not be able to assist you with questions regarding bills from these companies. ° °You will be contacted with the lab results as soon as they are available. The fastest way to get your results is to activate your My Chart account. Instructions are located on the last page of this paperwork. If you have not heard from us regarding the results in 2 weeks, please contact this office. °  ° ° ° °

## 2019-02-01 NOTE — Progress Notes (Signed)
3/20/20204:00 PM  Gabrielle [name] 1955-12-24, 63 y.o., female [id number]  Chief Complaint  Patient presents with  . Follow-up    MEDICAL CONDITIONS  . Hypertension  . Diabetes    HPI:   Patient is a 63 y.o. female with past medical history significant for HTN, DM2, HLP, rectal cancer who presents today for routine followup  Last OV in dec 2019 Started faxiga 55m once a day - tolerating well Still on metformin and tresiba 20 units Checked this morning 129, fasting Last night 142, before her insulin Checks about once a week due to cost of stripes Has seen eye doctor recently, eye doctor had some concerns regarding optic nerve, has followup next week  Lab Results  Component Value Date   HGBA1C 11.4 (H) 11/02/2018   HGBA1C 8.5 (A) 08/08/2018   HGBA1C 7.8 (A) 05/09/2018   Lab Results  Component Value Date   MICROALBUR 1.2 11/05/2014   LDLCALC 108 (H) 11/02/2018   CREATININE 0.83 11/02/2018    Fall Risk  02/01/2019 11/02/2018 04/12/2018 03/09/2018 02/05/2018  Falls in the past year? 0 0 No No No  Number falls in past yr: 0 - - - -  Injury with Fall? 0 - - - -  Follow up Falls evaluation completed - - - -     Dep[facility] 02/01/2019 11/02/2018 04/12/2018  Decreased Interest 0 0 0  Down, Depressed, Hopeless 0 0 0  PHQ - 2 Score 0 0 0    No Known Allergies  Prior to Admission medications   Medication Sig Start Date End Date Taking? Authorizing Provider  amLODipine (NORVASC) 10 MG tablet Take 1 tablet (10 mg total) by mouth daily. 11/02/18  Yes SRutherford Guys MD  aspirin 81 MG tablet Take 81 mg by mouth daily.   Yes [provider]  atorvastatin (LIPITOR) 10 MG tablet Take 1 tablet (10 mg total) by mouth every morning. 11/02/18  Yes SRutherford Guys MD  betamethasone dipropionate (DIPROLENE) 0.05 % cream Apply topically 2 (two) times daily. 11/13/17  Yes English, SColletta MarylandD, PA  dapagliflozin propanediol (FARXIGA) 10 MG TABS tablet Take 10 mg  by mouth daily. 11/13/18  Yes SRutherford Guys MD  glucose blood (ACCU-CHEK SMARTVIEW) test strip PATIENT NEEDS OFFICE VISIT FOR ADDITIONAL REFILLS - very overdue for DM check up. 11/11/15  Yes English, SColletta MarylandD, PA  insulin degludec (TRESIBA FLEXTOUCH) 100 UNIT/ML SOPN FlexTouch Pen INJECT 0.2 MLS (20 UNITS TOTAL) INTO THE SKIN AT BEDTIME 11/02/18  Yes SRutherford Guys MD  lisinopril (PRINIVIL,ZESTRIL) 10 MG tablet Take 1 tablet (10 mg total) by mouth every morning. 11/02/18  Yes SRutherford Guys MD  metFORMIN (GLUCOPHAGE) 1000 MG tablet Take 1 tablet (1,000 mg total) by mouth 2 (two) times daily with a meal. 11/02/18  Yes SRutherford Guys MD  triamcinolone (KENALOG) 0.025 % cream Apply 1 application topically 2 (two) times daily. 11/13/17  Yes EJoretta Bachelor PA    Past Medical History:  Diagnosis Date  . Cancer ([place]    rectal CA  . Cataract   . Eczema   . Hyperlipidemia   . Hypertension   . Intrinsic (urethral) sphincter deficiency (ISD)   . RBBB   . SUI (stress urinary incontinence, female)    hx of  . [place] 2 diabetes [place]   . Wears dentures    UPPER  . Wears glasses     Past Surgical History:  Procedure Laterality Date  .  ABDOMINAL HYSTERECTOMY  1998   fibroids, partial  . ABDOMINOPLASTY  1999  . BLADDER SUSPENSION N/A 03/06/2014   Procedure: [facility] SLING ;  Surgeon: Irine Seal, MD;  Location: [facility];  Service: Urology;  Laterality: N/A;  . CARDIOVASCULAR STRESS TEST  03-27-2008   NORMAL PERFUSION STUDY/  EF 54%  . COLON SURGERY  2010   for rectal ca  . COLONOSCOPY    . EUS N/A 03/02/2017   Procedure: LOWER ENDOSCOPIC ULTRASOUND (EUS);  Surgeon: Milus Banister, MD;  Location: [facility];  Service: Endoscopy;  Laterality: N/A;  . EXCISION LOWER ABDOMINAL SCAR POST ABDOMINOPLASTY  01-05-2000  . ORIF RIGHT ANKLE FX  12-24-2008  . RIGHT ANKLE ARTHROSCOPY W/ DEBRIDEMENT AND OPEN REMOVAL HARDWARE  03-12-2010  . TRANSTHORACIC  ECHOCARDIOGRAM  08-05-2009   MILD LVH/  EF 60-65%  . TUBAL LIGATION      Social History   Tobacco Use  . Smoking status: Former Smoker    Years: 20.00    Types: Cigarettes    Last attempt to quit: 11/14/2006    Years since quitting: 12.2  . Smokeless tobacco: Never Used  Substance Use Topics  . Alcohol use: No    Family History  Problem Relation Age of Onset  . Diabetes Mother   . Hypertension Mother   . Diabetes Sister   . Hypertension Sister   . Diabetes Brother   . Hypertension Brother   . Lupus Daughter   . Colon cancer Neg Hx   . Esophageal cancer Neg Hx   . Rectal cancer Neg Hx   . Stomach cancer Neg Hx     Review of Systems  Constitutional: Negative for chills and fever.  Respiratory: Negative for cough and shortness of breath.   Cardiovascular: Negative for chest pain, palpitations and leg swelling.  Gastrointestinal: Negative for abdominal pain, nausea and vomiting.     OBJECTIVE:  Today's Vitals   02/01/19 1542  BP: 128/70  Pulse: 77  Resp: 18  Temp: 98.3 F (36.8 C)  TempSrc: Oral  SpO2: 96%  Weight: 129 lb 3.2 oz (58.6 kg)  Height: 4' 10.23" (1.479 m)   Body mass index is 26.79 kg/m.  Wt Readings from Last 3 Encounters:  02/01/19 129 lb 3.2 oz (58.6 kg)  11/02/18 130 lb 6.4 oz (59.1 kg)  08/08/18 132 lb 9.6 oz (60.1 kg)    Physical Exam Vitals signs and nursing note reviewed.  Constitutional:      Appearance: She is well-developed.  HENT:     Head: Normocephalic and atraumatic.  Eyes:     General: No scleral icterus.    Conjunctiva/sclera: Conjunctivae normal.     Pupils: Pupils are equal, round, and reactive to light.  Neck:     Musculoskeletal: Neck supple.  Pulmonary:     Effort: Pulmonary effort is normal.  Skin:    General: Skin is warm and dry.  Neurological:     Mental Status: She is alert and oriented to person, place, and time.     Results for orders placed or performed in visit on 02/01/19 (from the past 24  hour(s))  POCT glycosylated hemoglobin (Hb A1C)     Status: Abnormal   Collection Time: 02/01/19  4:15 PM  Result Value Ref Range   Hemoglobin A1C 7.8 (A) 4.0 - 5.6 %   HbA1c POC (<> result, manual entry)     HbA1c, POC (prediabetic range)     HbA1c, POC (controlled diabetic range)  ASSESSMENT and PLAN  1. Type 2 diabetes mellitus with hyperglycemia, with long-term current use of insulin (HCC) - POCT glycosylated hemoglobin (Hb A1C) Much improved. Continue current regime. Continue with LFM. Discussed goal < 7.0  2. Essential hypertension Controlled. Continue current regime.  - Lipid panel - CMP14+EGFR  3. Pure hypercholesterolemia Last LDL slightly above goal. Increasing atorvastatin to 37m once a day - Lipid panel - CMP14+EGFR    Return in about 3 months (around 05/04/2019).    IRutherford Guys MD [facility] [place] [id number].  [phone] [phone]

## 2019-02-02 LAB — CMP14+EGFR
ALT: 14 IU/L (ref 0–32)
AST: 15 IU/L (ref 0–40)
Albumin/Globulin Ratio: 1.3 (ref 1.2–2.2)
Albumin: 4.6 g/dL (ref 3.8–4.8)
Alkaline Phosphatase: 99 IU/L (ref 39–117)
BUN/Creatinine Ratio: 16 (ref 12–28)
BUN: 13 mg/dL (ref 8–27)
Bilirubin Total: 0.2 mg/dL (ref 0.0–1.2)
CO2: 21 mmol/L (ref 20–29)
Calcium: 10.1 mg/dL (ref 8.7–10.3)
Chloride: 96 mmol/L (ref 96–106)
Creatinine, Ser: 0.82 mg/dL (ref 0.57–1.00)
GFR calc Af Amer: 89 mL/min/{1.73_m2} (ref >59)
GFR calc non Af Amer: 77 mL/min/{1.73_m2} (ref >59)
Globulin, Total: 3.5 g/dL (ref 1.5–4.5)
Glucose: 90 mg/dL (ref 65–99)
Potassium: 4.4 mmol/L (ref 3.5–5.2)
Sodium: 136 mmol/L (ref 134–144)
Total Protein: 8.1 g/dL (ref 6.0–8.5)

## 2019-02-02 LAB — LIPID PANEL
Chol/HDL Ratio: 5.2 ratio — ABNORMAL HIGH (ref 0.0–4.4)
Cholesterol, Total: 206 mg/dL — ABNORMAL HIGH (ref 100–199)
HDL: 40 mg/dL (ref >39)
LDL Calculated: 131 mg/dL — ABNORMAL HIGH (ref 0–99)
Triglycerides: 173 mg/dL — ABNORMAL HIGH (ref 0–149)
VLDL Cholesterol Cal: 35 mg/dL (ref 5–40)

## 2019-02-18 ENCOUNTER — Other Ambulatory Visit: Payer: Self-pay

## 2019-02-18 ENCOUNTER — Ambulatory Visit (INDEPENDENT_AMBULATORY_CARE_PROVIDER_SITE_OTHER): Payer: BC Managed Care – PPO | Admitting: Family Medicine

## 2019-02-18 ENCOUNTER — Emergency Department (HOSPITAL_COMMUNITY)
Admission: EM | Admit: 2019-02-18 | Discharge: 2019-02-18 | Disposition: A | Discharge status: Home / Self Care | Payer: BC Managed Care – PPO | Attending: Emergency Medicine | Admitting: Emergency Medicine

## 2019-02-18 DIAGNOSIS — E119 Type 2 diabetes mellitus without complications: Secondary | ICD-10-CM | POA: Insufficient documentation

## 2019-02-18 DIAGNOSIS — E7800 Pure hypercholesterolemia, unspecified: Secondary | ICD-10-CM | POA: Insufficient documentation

## 2019-02-18 DIAGNOSIS — Z79899 Other long term (current) drug therapy: Secondary | ICD-10-CM | POA: Insufficient documentation

## 2019-02-18 DIAGNOSIS — K0889 Other specified disorders of teeth and supporting structures: Secondary | ICD-10-CM | POA: Diagnosis not present

## 2019-02-18 DIAGNOSIS — I10 Essential (primary) hypertension: Secondary | ICD-10-CM | POA: Insufficient documentation

## 2019-02-18 DIAGNOSIS — R300 Dysuria: Secondary | ICD-10-CM

## 2019-02-18 DIAGNOSIS — E785 Hyperlipidemia, unspecified: Secondary | ICD-10-CM | POA: Diagnosis not present

## 2019-02-18 DIAGNOSIS — Z87891 Personal history of nicotine dependence: Secondary | ICD-10-CM | POA: Diagnosis not present

## 2019-02-18 DIAGNOSIS — N898 Other specified noninflammatory disorders of vagina: Secondary | ICD-10-CM

## 2019-02-18 DIAGNOSIS — I2510 Atherosclerotic heart disease of native coronary artery without angina pectoris: Secondary | ICD-10-CM | POA: Diagnosis not present

## 2019-02-18 DIAGNOSIS — Z794 Long term (current) use of insulin: Secondary | ICD-10-CM | POA: Insufficient documentation

## 2019-02-18 DIAGNOSIS — Z7982 Long term (current) use of aspirin: Secondary | ICD-10-CM | POA: Diagnosis not present

## 2019-02-18 LAB — POCT URINALYSIS DIP (MANUAL ENTRY)
Bilirubin, UA: NEGATIVE
Glucose, UA: >=1000 mg/dL — AB
Ketones, POC UA: NEGATIVE mg/dL
Leukocytes, UA: NEGATIVE
Nitrite, UA: NEGATIVE
Protein Ur, POC: NEGATIVE mg/dL
Spec Grav, UA: 1.015 (ref 1.010–1.025)
Urobilinogen, UA: 0.2 E.U./dL
pH, UA: 6 (ref 5.0–8.0)

## 2019-02-18 MED ORDER — AMOXICILLIN 500 MG PO CAPS: 500.0000 mg | ORAL_CAPSULE | Freq: Two times a day (BID) | ORAL | 0 refills | Status: AC

## 2019-02-18 NOTE — ED Provider Notes (Signed)
[facility] EMERGENCY DEPARTMENT Provider Note   CSN: [id number] Arrival date & time: 02/18/19  1501    History   Chief Complaint Chief Complaint  Patient presents with  . Dental Pain    HPI Gabrielle [name] is a 63 y.o. female.     HPI  Patient is a 63 year old female with a history of cancer, cataracts, eczema, hyperlipidemia, hypertension, diabetes, who presents emergency department today complaining of left lower dental pain that began about 2 to 3 days ago.  Pain constant.  Has tried naproxen with mild relief.  She is concerned for possible dental abscess.  She contacted her dentist, Dr. Hoyt Koch, prior to arrival who advised her to come to the ED for evaluation given that his offices are currently closed due to recent [place] pandemic.  Past Medical History:  Diagnosis Date  . Cancer ([place])    rectal CA  . Cataract   . Eczema   . Hyperlipidemia   . Hypertension   . Intrinsic (urethral) sphincter deficiency (ISD)   . RBBB   . SUI (stress urinary incontinence, female)    hx of  . Type 2 diabetes mellitus ([place])   . Wears dentures    UPPER  . Wears glasses     Patient Active Problem List   Diagnosis Date Noted  . Uncontrolled type 2 diabetes mellitus with hyperglycemia ([place]) 03/09/2018  . Rectal carcinoid tumor   . Sepsis due to urinary tract infection ([place]) 02/02/2015  . Essential hypertension 02/02/2015  . Atherosclerosis of native arteries of extremity with intermittent claudication ([place]) 02/02/2015  . Sepsis ([place]) 02/02/2015  . Stress incontinence in female 03/06/2014  . 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 Diabetes mellitus ([place]) 02/28/2012  . High cholesterol 02/28/2012  . Hypertension 02/28/2012    Past Surgical History:  Procedure Laterality Date  . ABDOMINAL HYSTERECTOMY  1998   fibroids, partial  . ABDOMINOPLASTY  1999  . BLADDER SUSPENSION N/A 03/06/2014   Procedure: [facility] SLING ;  Surgeon: Irine Seal, MD;  Location:  [facility];  Service: Urology;  Laterality: N/A;  . CARDIOVASCULAR STRESS TEST  03-27-2008   NORMAL PERFUSION STUDY/  EF 54%  . COLON SURGERY  2010   for rectal ca  . COLONOSCOPY    . EUS N/A 03/02/2017   Procedure: LOWER ENDOSCOPIC ULTRASOUND (EUS);  Surgeon: Milus Banister, MD;  Location: [facility];  Service: Endoscopy;  Laterality: N/A;  . EXCISION LOWER ABDOMINAL SCAR POST ABDOMINOPLASTY  01-05-2000  . ORIF RIGHT ANKLE FX  12-24-2008  . RIGHT ANKLE ARTHROSCOPY W/ DEBRIDEMENT AND OPEN REMOVAL HARDWARE  03-12-2010  . TRANSTHORACIC ECHOCARDIOGRAM  08-05-2009   MILD LVH/  EF 60-65%  . TUBAL LIGATION       OB History   No obstetric history on file.      Home Medications    Prior to Admission medications   Medication Sig Start Date End Date Taking? Authorizing Provider  amLODipine (NORVASC) 10 MG tablet Take 1 tablet (10 mg total) by mouth daily. 11/02/18   Rutherford Guys, MD  amoxicillin (AMOXIL) 500 MG capsule Take 1 capsule (500 mg total) by mouth 2 (two) times daily for 7 days. 02/18/19 02/25/19  Jafar Poffenberger S, PA-C  aspirin 81 MG tablet Take 81 mg by mouth daily.    [provider]  atorvastatin (LIPITOR) 20 MG tablet Take 1 tablet (20 mg total) by mouth every morning. 02/01/19   Rutherford Guys, MD  betamethasone  dipropionate (DIPROLENE) 0.05 % cream Apply topically 2 (two) times daily. 11/13/17   Ivar Drape D, PA  dapagliflozin propanediol (FARXIGA) 10 MG TABS tablet Take 10 mg by mouth daily. 02/01/19   Rutherford Guys, MD  glucose blood (ACCU-CHEK SMARTVIEW) test strip PATIENT NEEDS OFFICE VISIT FOR ADDITIONAL REFILLS - very overdue for DM check up. 11/11/15   Ivar Drape D, PA  insulin degludec (TRESIBA FLEXTOUCH) 100 UNIT/ML SOPN FlexTouch Pen INJECT 0.2 MLS (20 UNITS TOTAL) INTO THE SKIN AT BEDTIME 11/02/18   Rutherford Guys, MD  lisinopril (PRINIVIL,ZESTRIL) 10 MG tablet Take 1 tablet (10 mg total) by mouth every morning.  11/02/18   Rutherford Guys, MD  metFORMIN (GLUCOPHAGE) 1000 MG tablet Take 1 tablet (1,000 mg total) by mouth 2 (two) times daily with a meal. 11/02/18   Rutherford Guys, MD  triamcinolone (KENALOG) 0.025 % cream Apply 1 application topically 2 (two) times daily. 11/13/17   Joretta Bachelor, PA    Family History Family History  Problem Relation Age of Onset  . Diabetes Mother   . Hypertension Mother   . Diabetes Sister   . Hypertension Sister   . Diabetes Brother   . Hypertension Brother   . Lupus Daughter   . Colon cancer Neg Hx   . Esophageal cancer Neg Hx   . Rectal cancer Neg Hx   . Stomach cancer Neg Hx     Social History Social History   Tobacco Use  . Smoking status: Former Smoker    Years: 20.00    Types: Cigarettes    Last attempt to quit: 11/14/2006    Years since quitting: 12.2  . Smokeless tobacco: Never Used  Substance Use Topics  . Alcohol use: No  . Drug use: No     Allergies   Patient has no known allergies.   Review of Systems Review of Systems  Constitutional: Negative for fever.  HENT: Positive for dental problem.      Physical Exam Updated Vital Signs BP (!) 142/71 (BP Location: Right Arm)   Pulse 90   Temp 98.7 F (37.1 C) (Oral)   Resp 18   SpO2 97%   Physical Exam Vitals signs and nursing note reviewed.  Constitutional:      General: She is not in acute distress.    Appearance: She is well-developed.  HENT:     Head: Normocephalic and atraumatic.     Mouth/Throat:     Comments: Lower mouth is edentulous.  Small fluid collection to anterior gumline of the left lower mouth.  Small amount of purulent drainage noted.  Area tender to palpation.  No large abscess.  No sublingual or submandibular swelling. Eyes:     Conjunctiva/sclera: Conjunctivae normal.  Neck:     Musculoskeletal: Neck supple.  Cardiovascular:     Rate and Rhythm: Normal rate.  Pulmonary:     Effort: Pulmonary effort is normal.  Musculoskeletal: Normal  range of motion.  Skin:    General: Skin is warm and dry.  Neurological:     Mental Status: She is alert.      ED Treatments / Results  Labs (all labs ordered are listed, but only abnormal results are displayed) Labs Reviewed - No data to display  EKG None  Radiology No results found.  Procedures Procedures (including critical care time)  Medications Ordered in ED Medications - No data to display   Initial Impression / Assessment and Plan / ED Course  I have  reviewed the triage vital signs and the nursing notes.  Pertinent labs & imaging results that were available during my care of the patient were reviewed by me and considered in my medical decision making (see chart for details).      Final Clinical Impressions(s) / ED Diagnoses   Final diagnoses:  Pain, dental   Patient presenting due to concern for possible dental abscess.  Was advised by her dentist to present to the ED for evaluation.  Afebrile, vital signs reassuring.  Lower mouth is edentulous.  Small fluid collection to anterior gumline of the left lower mouth.  Small amount of purulent drainage noted.  Area tender to palpation.  No large abscess.  No sublingual or submandibular swelling.  Dr. Hoyt Koch at bedside to examine patient.  Recommends amoxicillin and to follow-up in the office for reassessment.  Patient given Rx.  Return precautions indicated.  Patient voices understanding and is in agreement with plan.  All questions answered.  Patient stable for discharge.  ED Discharge Orders         Ordered    amoxicillin (AMOXIL) 500 MG capsule  2 times daily     02/18/19 [address] Kadence Mimbs S, PA-C 02/18/19 1546    Elnora Morrison, MD 02/20/19 2032

## 2019-02-18 NOTE — Discharge Instructions (Addendum)
You were given a prescription for antibiotics. Please take the antibiotic prescription fully.   Please follow-up with a dentist in the next 5 to 7 days for reevaluation.  If you do not have a dentist, resources were provided for dentist in the area in your discharge summary.  Please contact one of the offices that are listed and make an appointment for follow-up.  Please return to the emergency department for any new or worsening symptoms.  

## 2019-02-18 NOTE — Consult Note (Signed)
Reason for Consult: gum lesion Referring Physician: ED  Gabrielle [name] is an 63 y.o. female.  CC: lesion on lower gingiva   HPI: Patient with h/o removal all lower teeth years ago. Never got dentures. Now with painful swelling for 2-3 days.   Past Medical History:  Diagnosis Date  . Cancer ([place])    rectal CA  . Cataract   . Eczema   . Hyperlipidemia   . Hypertension   . Intrinsic (urethral) sphincter deficiency (ISD)   . RBBB   . SUI (stress urinary incontinence, female)    hx of  . Type 2 diabetes mellitus ([place])   . Wears dentures    UPPER  . Wears glasses     Past Surgical History:  Procedure Laterality Date  . ABDOMINAL HYSTERECTOMY  1998   fibroids, partial  . ABDOMINOPLASTY  1999  . BLADDER SUSPENSION N/A 03/06/2014   Procedure: [facility] SLING ;  Surgeon: Irine Seal, MD;  Location: [facility];  Service: Urology;  Laterality: N/A;  . CARDIOVASCULAR STRESS TEST  03-27-2008   NORMAL PERFUSION STUDY/  EF 54%  . COLON SURGERY  2010   for rectal ca  . COLONOSCOPY    . EUS N/A 03/02/2017   Procedure: LOWER ENDOSCOPIC ULTRASOUND (EUS);  Surgeon: Milus Banister, MD;  Location: [facility];  Service: Endoscopy;  Laterality: N/A;  . EXCISION LOWER ABDOMINAL SCAR POST ABDOMINOPLASTY  01-05-2000  . ORIF RIGHT ANKLE FX  12-24-2008  . RIGHT ANKLE ARTHROSCOPY W/ DEBRIDEMENT AND OPEN REMOVAL HARDWARE  03-12-2010  . TRANSTHORACIC ECHOCARDIOGRAM  08-05-2009   MILD LVH/  EF 60-65%  . TUBAL LIGATION      Family History  Problem Relation Age of Onset  . Diabetes Mother   . Hypertension Mother   . Diabetes Sister   . Hypertension Sister   . Diabetes Brother   . Hypertension Brother   . Lupus Daughter   . Colon cancer Neg Hx   . Esophageal cancer Neg Hx   . Rectal cancer Neg Hx   . Stomach cancer Neg Hx     Social History:  reports that she quit smoking about 12 years ago. Her smoking use included cigarettes. She quit after 20.00 years of use. She has never  used smokeless tobacco. She reports that she does not drink alcohol or use drugs.  Allergies: No Known Allergies  Medications: I have reviewed the patient's current medications.  Results for orders placed or performed in visit on 02/18/19 (from the past 48 hour(s))  POCT urinalysis dipstick     Status: Abnormal   Collection Time: 02/18/19 10:46 AM  Result Value Ref Range   Color, UA yellow yellow   Clarity, UA clear clear   Glucose, UA >=1,000 (A) negative mg/dL   Bilirubin, UA negative negative   Ketones, POC UA negative negative mg/dL   Spec Grav, UA 1.015 1.010 - 1.025   Blood, UA trace-intact (A) negative   pH, UA 6.0 5.0 - 8.0   Protein Ur, POC negative negative mg/dL   Urobilinogen, UA 0.2 0.2 or 1.0 E.U./dL   Nitrite, UA Negative Negative   Leukocytes, UA Negative Negative    No results found.  ROS Blood pressure (!) 142/71, pulse 90, temperature 98.7 F (37.1 C), temperature source Oral, resp. rate 18, SpO2 97 %. General appearance: alert, cooperative and no distress Head: Normocephalic, without obvious abnormality, atraumatic Eyes: negative Nose: Nares normal. Septum midline. Mucosa normal. No drainage or sinus tenderness. Throat:  Edentulous mandible. 49mm x 90mm pink exophytic lesion with pustule buccal  alveolar crest at #22 area. No buccal fluctuance, edema, trismus.  Neck: no adenopathy and supple, symmetrical, trachea midline  Assessment/Plan: Gingival lesion, possibly secondary to infection of tooth fragment or bone. Recommend antibiotics, peridex rinse. Will evaluate in office further when quarantine lifted.  Diona Browner 02/18/2019, 3:29 PM

## 2019-02-18 NOTE — ED Triage Notes (Signed)
Pt reports she is here to see Dr. Hoyt Koch for abscess to L lower side x 2-3 days. Pt reports pain to L lower jaw, she took naproxen and reports this relieved her pain.

## 2019-02-19 ENCOUNTER — Telehealth (INDEPENDENT_AMBULATORY_CARE_PROVIDER_SITE_OTHER): Payer: BC Managed Care – PPO | Admitting: Family Medicine

## 2019-02-19 DIAGNOSIS — N898 Other specified noninflammatory disorders of vagina: Secondary | ICD-10-CM

## 2019-02-19 DIAGNOSIS — R300 Dysuria: Secondary | ICD-10-CM | POA: Diagnosis not present

## 2019-02-19 LAB — WET PREP FOR TRICH, YEAST, CLUE
Clue Cell Exam: NEGATIVE
Trichomonas Exam: NEGATIVE
Yeast Exam: NEGATIVE

## 2019-02-19 MED ORDER — FLUCONAZOLE 150 MG PO TABS: 150.0000 mg | ORAL_TABLET | Freq: Once | ORAL | 0 refills | Status: DC

## 2019-02-19 MED ORDER — CLOTRIMAZOLE-BETAMETHASONE 1-0.05 % EX CREA: 1.0000 "application " | TOPICAL_CREAM | Freq: Two times a day (BID) | CUTANEOUS | 0 refills | Status: DC

## 2019-02-19 MED ORDER — FLUCONAZOLE 150 MG PO TABS: 150.0000 mg | ORAL_TABLET | Freq: Once | ORAL | 1 refills | Status: AC

## 2019-02-19 NOTE — Progress Notes (Signed)
CC- vaginal itching for 1 week, my whole bottle is raw. Had a nose bleed this morning that came all of a sudden. Nose was feeling dry before the bleeding started. It is a lot of pollen outside this may be the cause but not sure and just thought she should let us know. Patient stated she have tried otc azo for the vaginal itching but has not helped.

## 2019-02-19 NOTE — Progress Notes (Signed)
Telemedicine Encounter- SOAP NOTE Established Patient  This telephone encounter was conducted with the patient's (or proxy's) verbal consent via audio telecommunications: yes/no: Yes Patient was instructed to have this encounter in a suitably private space; and to only have persons present to whom they give permission to participate. In addition, patient identity was confirmed by use of name plus two identifiers (DOB and address).  I discussed the limitations, risks, security and privacy concerns of performing an evaluation and management service by telephone and the availability of in person appointments. I also discussed with the patient that there may be a patient responsible charge related to this service. The patient expressed understanding and agreed to proceed.  I spent a total of TIME; 0 MIN TO 60 MIN: 15 minutes talking with the patient or their proxy.  CC: vaginal   Subjective   Gabrielle [name] is a 63 y.o. established patient. Telephone visit today for  HPI  Patient reports that her bottom is raw and after the itching started She states that she has been itching inside her vagina and externally She reports that she tried azo and over the counter yeast suppositories but is still itching She states that since she has been scratching she feels very raw She states that she also now gets burning with urination    Itch then burn   Patient Active Problem List   Diagnosis Date Noted  . Uncontrolled type 2 diabetes mellitus with hyperglycemia ([place]) 03/09/2018  . Rectal carcinoid tumor   . Sepsis due to urinary tract infection ([place]) 02/02/2015  . Essential hypertension 02/02/2015  . Atherosclerosis of native arteries of extremity with intermittent claudication ([place]) 02/02/2015  . Sepsis ([place]) 02/02/2015  . Stress incontinence in female 03/06/2014  . 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 Diabetes mellitus ([place]) 02/28/2012  . High cholesterol 02/28/2012  .  Hypertension 02/28/2012    Past Medical History:  Diagnosis Date  . Cancer ([place])    rectal CA  . Cataract   . Eczema   . Hyperlipidemia   . Hypertension   . Intrinsic (urethral) sphincter deficiency (ISD)   . RBBB   . SUI (stress urinary incontinence, female)    hx of  . Type 2 diabetes mellitus ([place])   . Wears dentures    UPPER  . Wears glasses     Current Outpatient Medications  Medication Sig Dispense Refill  . amLODipine (NORVASC) 10 MG tablet Take 1 tablet (10 mg total) by mouth daily. 90 tablet 3  . amoxicillin (AMOXIL) 500 MG capsule Take 1 capsule (500 mg total) by mouth 2 (two) times daily for 7 days. 14 capsule 0  . aspirin 81 MG tablet Take 81 mg by mouth daily.    Marland Kitchen atorvastatin (LIPITOR) 20 MG tablet Take 1 tablet (20 mg total) by mouth every morning. 90 tablet 3  . betamethasone dipropionate (DIPROLENE) 0.05 % cream Apply topically 2 (two) times daily. 30 g 1  . dapagliflozin propanediol (FARXIGA) 10 MG TABS tablet Take 10 mg by mouth daily. 30 tablet 11  . glucose blood (ACCU-CHEK SMARTVIEW) test strip PATIENT NEEDS OFFICE VISIT FOR ADDITIONAL REFILLS - very overdue for DM check up. 100 each 0  . insulin degludec (TRESIBA FLEXTOUCH) 100 UNIT/ML SOPN FlexTouch Pen INJECT 0.2 MLS (20 UNITS TOTAL) INTO THE SKIN AT BEDTIME 15 pen 1  . lisinopril (PRINIVIL,ZESTRIL) 10 MG tablet Take 1 tablet (10 mg total) by mouth every morning. 90 tablet 3  . metFORMIN (GLUCOPHAGE) 1000  MG tablet Take 1 tablet (1,000 mg total) by mouth 2 (two) times daily with a meal. 180 tablet 3  . triamcinolone (KENALOG) 0.025 % cream Apply 1 application topically 2 (two) times daily. 30 g 1  . clotrimazole-betamethasone (LOTRISONE) cream Apply 1 application topically 2 (two) times daily. 30 g 0  . fluconazole (DIFLUCAN) 150 MG tablet Take 1 tablet (150 mg total) by mouth once for 1 dose. Take second in 3 days. 2 tablet 1   Current Facility-Administered Medications  Medication Dose Route Frequency  Provider Last Rate Last Dose  . 0.9 %  sodium chloride infusion  500 mL Intravenous Continuous Armbruster, Carlota Raspberry, MD        No Known Allergies  Social History   Socioeconomic History  . Marital status: Married    Spouse name: Not on file  . Number of children: 1  . Years of education: Not on file  . Highest education level: Not on file  Occupational History  . Not on file  Social Needs  . Financial resource strain: Not on file  . Food insecurity:    Worry: Not on file    Inability: Not on file  . Transportation needs:    Medical: Not on file    Non-medical: Not on file  Tobacco Use  . Smoking status: Former Smoker    Years: 20.00    Types: Cigarettes    Last attempt to quit: 11/14/2006    Years since quitting: 12.2  . Smokeless tobacco: Never Used  Substance and Sexual Activity  . Alcohol use: No  . Drug use: No  . Sexual activity: Not on file  Lifestyle  . Physical activity:    Days per week: Not on file    Minutes per session: Not on file  . Stress: Not on file  Relationships  . Social connections:    Talks on phone: Not on file    Gets together: Not on file    Attends religious service: Not on file    Active member of club or organization: Not on file    Attends meetings of clubs or organizations: Not on file    Relationship status: Not on file  . Intimate partner violence:    Fear of current or ex partner: Not on file    Emotionally abused: Not on file    Physically abused: Not on file    Forced sexual activity: Not on file  Other Topics Concern  . Not on file  Social History Narrative  . Not on file    ROS  Objective   Vitals as reported by the patient: There were no vitals filed for this visit. Wet prep negative   Diagnoses and all orders for this visit:  Vaginal itching-  Discussed skin care, avoid baths and soaks Take diflucan and use topical lotrisone She should avoid fragrances    Dysuria -     Discontinue: fluconazole (DIFLUCAN)  150 MG tablet; Take 1 tablet (150 mg total) by mouth once for 1 dose. Take second in 3 days. -     fluconazole (DIFLUCAN) 150 MG tablet; Take 1 tablet (150 mg total) by mouth once for 1 dose. Take second in 3 days.  Other orders -     clotrimazole-betamethasone (LOTRISONE) cream; Apply 1 application topically 2 (two) times daily.     I discussed the assessment and treatment plan with the patient. The patient was provided an opportunity to ask questions and all were answered. The patient  agreed with the plan and demonstrated an understanding of the instructions.   The patient was advised to call back or seek an in-person evaluation if the symptoms worsen or if the condition fails to improve as anticipated.  I provided 15 minutes of non-face-to-face time during this encounter.  Forrest Moron, MD  Primary Care at [facility]

## 2019-02-19 NOTE — Patient Instructions (Signed)
° ° ° °  If you have lab work done today you will be contacted with your lab results within the next 2 weeks.  If you have not heard from us then please contact us. The fastest way to get your results is to register for My Chart. ° ° °IF you received an x-ray today, you will receive an invoice from [facility]. Please contact [facility] at [phone] with questions or concerns regarding your invoice.  ° °IF you received labwork today, you will receive an invoice from [facility]. Please contact [facility] at [phone] with questions or concerns regarding your invoice.  ° °Our billing staff will not be able to assist you with questions regarding bills from these companies. ° °You will be contacted with the lab results as soon as they are available. The fastest way to get your results is to activate your My Chart account. Instructions are located on the last page of this paperwork. If you have not heard from us regarding the results in 2 weeks, please contact this office. °  ° ° ° °

## 2019-03-05 ENCOUNTER — Telehealth: Payer: Self-pay | Admitting: Family Medicine

## 2019-03-05 NOTE — Telephone Encounter (Signed)
Copied from [place] [id number]. Topic: General - Other >> Mar 05, 2019  3:20 PM Celene Kras A wrote: Reason for CRM: Janie, from [facility], called stating pt is experiencing spells from her diabetes medications. Narda Rutherford stated the pt told her she is feeling shaky and her blood sugar is dropping.  Pt tells Narda Rutherford that instead of 20 units of tresiba she is only taking 10 to avoid the spells. Please contact pt and advise.

## 2019-03-05 NOTE — Telephone Encounter (Signed)
Pt is making an appt to discuss low blood sugar. She is not taking the 20 units rx'd only taking 10 at night. Says glucose reading at night is under 100

## 2019-03-06 NOTE — Telephone Encounter (Signed)
She has done the right thing, decease her insulin. I will discuss further during our appt tomorrow

## 2019-03-07 ENCOUNTER — Telehealth (INDEPENDENT_AMBULATORY_CARE_PROVIDER_SITE_OTHER): Payer: BC Managed Care – PPO | Admitting: Family Medicine

## 2019-03-07 ENCOUNTER — Other Ambulatory Visit: Payer: Self-pay

## 2019-03-07 DIAGNOSIS — E11649 Type 2 diabetes mellitus with hypoglycemia without coma: Secondary | ICD-10-CM | POA: Diagnosis not present

## 2019-03-07 NOTE — Progress Notes (Signed)
pt says her insulin levels have ben very low in the am and pm reading 100 or lower. She does not take the insulin at this time. Her numbers began to drop after her last ov with you. She feel fatigue all the time. Taking all othere meds as  rx'd, no refill needed

## 2019-03-07 NOTE — Progress Notes (Signed)
Virtual Visit via telephone Note  I connected with patient on 03/07/19 at 143pm by telephone and verified that I am speaking with the correct person using two identifiers. Gabrielle [name] is currently located at home and patient is currently with her during visit. The provider, Rutherford Guys, MD is located in their office at time of visit.  I discussed the limitations, risks, security and privacy concerns of performing an evaluation and management service by telephone and the availability of in person appointments. I also discussed with the patient that there may be a patient responsible charge related to this service. The patient expressed understanding and agreed to proceed.   Telephone visit today for recurring hypoglycemia  HPI ? Patient is a 63 y.o. female with past medical history significant for HTN, DM2, HLP, rectal cancerwho presents today for recurring hypoglycemia  Last OV March 2020 a1c had improved on tresiba 20u, farxiga 10mg  and metformin max dose  Since last OV she has been having recurring hypoglycemia She decreased from 20 units to 10 units, hypoglycemia continued so she has stopped using insulin  Patient reported cbgs from this week  Sunday bedtime 90, did not take insulin Monday morning 104, at night 110, did not take insulin Tuesday morning 106, at bedtime 138, took 10 units of insulin Wednesday morning 86, at bedtime 102, did not take insulin This morning 102 She reports no significant changes in diet Has been out of work since march 18th  Lab Results  Component Value Date   HGBA1C 7.8 (A) 02/01/2019   HGBA1C 11.4 (H) 11/02/2018   HGBA1C 8.5 (A) 08/08/2018   Lab Results  Component Value Date   MICROALBUR 1.2 11/05/2014   LDLCALC 131 (H) 02/01/2019   CREATININE 0.82 02/01/2019     Fall Risk  03/07/2019 02/19/2019 02/01/2019 11/02/2018 04/12/2018  Falls in the past year? 0 0 0 0 No  Number falls in past yr: 0 0 0 - -  Injury with Fall? 0 0 0 - -   Follow up - - Falls evaluation completed - -     Depression screen [facility] 2/9 03/07/2019 02/19/2019 02/01/2019  Decreased Interest 0 0 0  Down, Depressed, Hopeless 0 0 0  PHQ - 2 Score 0 0 0    No Known Allergies  Prior to Admission medications   Medication Sig Start Date End Date Taking? Authorizing Provider  amLODipine (NORVASC) 10 MG tablet Take 1 tablet (10 mg total) by mouth daily. 11/02/18  Yes Rutherford Guys, MD  aspirin 81 MG tablet Take 81 mg by mouth daily.   Yes [provider]  atorvastatin (LIPITOR) 20 MG tablet Take 1 tablet (20 mg total) by mouth every morning. 02/01/19  Yes Rutherford Guys, MD  dapagliflozin propanediol (FARXIGA) 10 MG TABS tablet Take 10 mg by mouth daily. 02/01/19  Yes Rutherford Guys, MD  glucose blood (ACCU-CHEK SMARTVIEW) test strip PATIENT NEEDS OFFICE VISIT FOR ADDITIONAL REFILLS - very overdue for DM check up. 11/11/15  Yes English, Colletta Maryland D, PA  insulin degludec (TRESIBA FLEXTOUCH) 100 UNIT/ML SOPN FlexTouch Pen INJECT 0.2 MLS (20 UNITS TOTAL) INTO THE SKIN AT BEDTIME 11/02/18  Yes Rutherford Guys, MD  lisinopril (PRINIVIL,ZESTRIL) 10 MG tablet Take 1 tablet (10 mg total) by mouth every morning. 11/02/18  Yes Rutherford Guys, MD  metFORMIN (GLUCOPHAGE) 1000 MG tablet Take 1 tablet (1,000 mg total) by mouth 2 (two) times daily with a meal. 11/02/18  Yes Rutherford Guys, MD  Past Medical History:  Diagnosis Date  . Cancer ([place])    rectal CA  . Cataract   . Eczema   . Hyperlipidemia   . Hypertension   . Intrinsic (urethral) sphincter deficiency (ISD)   . RBBB   . SUI (stress urinary incontinence, female)    hx of  . Type 2 diabetes mellitus ([place])   . Wears dentures    UPPER  . Wears glasses     Past Surgical History:  Procedure Laterality Date  . ABDOMINAL HYSTERECTOMY  1998   fibroids, partial  . ABDOMINOPLASTY  1999  . BLADDER SUSPENSION N/A 03/06/2014   Procedure: [facility] SLING ;  Surgeon: Irine Seal, MD;  Location:  [facility];  Service: Urology;  Laterality: N/A;  . CARDIOVASCULAR STRESS TEST  03-27-2008   NORMAL PERFUSION STUDY/  EF 54%  . COLON SURGERY  2010   for rectal ca  . COLONOSCOPY    . EUS N/A 03/02/2017   Procedure: LOWER ENDOSCOPIC ULTRASOUND (EUS);  Surgeon: Milus Banister, MD;  Location: [facility];  Service: Endoscopy;  Laterality: N/A;  . EXCISION LOWER ABDOMINAL SCAR POST ABDOMINOPLASTY  01-05-2000  . ORIF RIGHT ANKLE FX  12-24-2008  . RIGHT ANKLE ARTHROSCOPY W/ DEBRIDEMENT AND OPEN REMOVAL HARDWARE  03-12-2010  . TRANSTHORACIC ECHOCARDIOGRAM  08-05-2009   MILD LVH/  EF 60-65%  . TUBAL LIGATION      Social History   Tobacco Use  . Smoking status: Former Smoker    Years: 20.00    Types: Cigarettes    Last attempt to quit: 11/14/2006    Years since quitting: 12.3  . Smokeless tobacco: Never Used  Substance Use Topics  . Alcohol use: No    Family History  Problem Relation Age of Onset  . Diabetes Mother   . Hypertension Mother   . Diabetes Sister   . Hypertension Sister   . Diabetes Brother   . Hypertension Brother   . Lupus Daughter   . Colon cancer Neg Hx   . Esophageal cancer Neg Hx   . Rectal cancer Neg Hx   . Stomach cancer Neg Hx     ROS Per hpi  Objective  Vitals as reported by the patient: none  There were no vitals filed for this visit.  ASSESSMENT and PLAN  1. Hypoglycemia associated with type 2 diabetes mellitus ([place]) Has now resolved with discontinuation of tresiba. Continue monitoring cbgs. Discussed fasting goal, consideration to restart tresiba at very low dose if fastings consistently become above goal.  FOLLOW-UP: as scheduled   The above assessment and management plan was discussed with the patient. The patient verbalized understanding of and has agreed to the management plan. Patient is aware to call the clinic if symptoms persist or worsen. Patient is aware when to return to the clinic for a follow-up visit. Patient  educated on when it is appropriate to go to the emergency department.    I provided 12 minutes of non-face-to-face time during this encounter.  Rutherford Guys, MD Primary Care at [facility] [place], [place] [address] Ph.  [phone] Fax [phone]

## 2019-05-10 ENCOUNTER — Encounter: Payer: Self-pay | Admitting: Family Medicine

## 2019-05-10 ENCOUNTER — Ambulatory Visit: Payer: BC Managed Care – PPO | Admitting: Family Medicine

## 2019-05-10 ENCOUNTER — Other Ambulatory Visit: Payer: Self-pay

## 2019-05-10 VITALS — BP 135/77 | HR 87 | Temp 98.7°F | Resp 16 | Ht <= 58 in | Wt 127.0 lb

## 2019-05-10 DIAGNOSIS — E1165 Type 2 diabetes mellitus with hyperglycemia: Secondary | ICD-10-CM

## 2019-05-10 DIAGNOSIS — I10 Essential (primary) hypertension: Secondary | ICD-10-CM | POA: Diagnosis not present

## 2019-05-10 DIAGNOSIS — E7800 Pure hypercholesterolemia, unspecified: Secondary | ICD-10-CM

## 2019-05-10 DIAGNOSIS — Z794 Long term (current) use of insulin: Secondary | ICD-10-CM

## 2019-05-10 NOTE — Progress Notes (Signed)
6/26/20204:48 PM  Gabrielle [name] July 12, 1956, 63 y.o., female [id number]  Chief Complaint  Patient presents with  . Diabetes    3 month follow up    HPI:   Patient is a 63 y.o. female with past medical history significant for HTN, DM2, rectal cancer who presents today for followup  Last OV April 2020 - telemedicine Was having recurring hypoglycemia - stopped tresiba Has been mostly not using tresiba Uses sporadically, 10 units if bedtime > 130, or 20 if > 200 Next fasting is within goal Has not had any more lows This morning fasting 101, last night 152 Saw eye doctor this 2020, has new eye glasses, Dr Herbert Deaner No numbness or tingling in her feet No dysuria or hematuria  Lab Results  Component Value Date   HGBA1C 7.8 (A) 02/01/2019   HGBA1C 11.4 (H) 11/02/2018   HGBA1C 8.5 (A) 08/08/2018   Lab Results  Component Value Date   MICROALBUR 1.2 11/05/2014   LDLCALC 131 (H) 02/01/2019   CREATININE 0.82 02/01/2019    Depression screen [facility] 2/9 05/10/2019 03/07/2019 02/19/2019  Decreased Interest 0 0 0  Down, Depressed, Hopeless 0 0 0  PHQ - 2 Score 0 0 0    Fall Risk  05/10/2019 03/07/2019 02/19/2019 02/01/2019 11/02/2018  Falls in the past year? 0 0 0 0 0  Number falls in past yr: 0 0 0 0 -  Injury with Fall? 0 0 0 0 -  Follow up - - - Falls evaluation completed -     No Known Allergies  Prior to Admission medications   Medication Sig Start Date End Date Taking? Authorizing Provider  amLODipine (NORVASC) 10 MG tablet Take 1 tablet (10 mg total) by mouth daily. 11/02/18  Yes Rutherford Guys, MD  aspirin 81 MG tablet Take 81 mg by mouth daily.   Yes [provider]  atorvastatin (LIPITOR) 20 MG tablet Take 1 tablet (20 mg total) by mouth every morning. 02/01/19  Yes Rutherford Guys, MD  dapagliflozin propanediol (FARXIGA) 10 MG TABS tablet Take 10 mg by mouth daily. 02/01/19  Yes Rutherford Guys, MD  glucose blood (ACCU-CHEK SMARTVIEW) test strip PATIENT NEEDS  OFFICE VISIT FOR ADDITIONAL REFILLS - very overdue for DM check up. 11/11/15  Yes English, Colletta Maryland D, PA  lisinopril (PRINIVIL,ZESTRIL) 10 MG tablet Take 1 tablet (10 mg total) by mouth every morning. 11/02/18  Yes Rutherford Guys, MD  metFORMIN (GLUCOPHAGE) 1000 MG tablet Take 1 tablet (1,000 mg total) by mouth 2 (two) times daily with a meal. 11/02/18  Yes Rutherford Guys, MD    Past Medical History:  Diagnosis Date  . Cancer ([place])    rectal CA  . Cataract   . Eczema   . Hyperlipidemia   . Hypertension   . Intrinsic (urethral) sphincter deficiency (ISD)   . RBBB   . SUI (stress urinary incontinence, female)    hx of  . Type 2 diabetes mellitus ([place])   . Wears dentures    UPPER  . Wears glasses     Past Surgical History:  Procedure Laterality Date  . ABDOMINAL HYSTERECTOMY  1998   fibroids, partial  . ABDOMINOPLASTY  1999  . BLADDER SUSPENSION N/A 03/06/2014   Procedure: [facility] SLING ;  Surgeon: Irine Seal, MD;  Location: [facility];  Service: Urology;  Laterality: N/A;  . CARDIOVASCULAR STRESS TEST  03-27-2008   NORMAL PERFUSION STUDY/  EF 54%  . COLON SURGERY  2010   for rectal ca  . COLONOSCOPY    . EUS N/A 03/02/2017   Procedure: LOWER ENDOSCOPIC ULTRASOUND (EUS);  Surgeon: Milus Banister, MD;  Location: [facility];  Service: Endoscopy;  Laterality: N/A;  . EXCISION LOWER ABDOMINAL SCAR POST ABDOMINOPLASTY  01-05-2000  . ORIF RIGHT ANKLE FX  12-24-2008  . RIGHT ANKLE ARTHROSCOPY W/ DEBRIDEMENT AND OPEN REMOVAL HARDWARE  03-12-2010  . TRANSTHORACIC ECHOCARDIOGRAM  08-05-2009   MILD LVH/  EF 60-65%  . TUBAL LIGATION      Social History   Tobacco Use  . Smoking status: Former Smoker    Years: 20.00    Types: Cigarettes    Quit date: 11/14/2006    Years since quitting: 12.4  . Smokeless tobacco: Never Used  Substance Use Topics  . Alcohol use: No    Family History  Problem Relation Age of Onset  . Diabetes Mother   . Hypertension Mother    . Diabetes Sister   . Hypertension Sister   . Diabetes Brother   . Hypertension Brother   . Lupus Daughter   . Colon cancer Neg Hx   . Esophageal cancer Neg Hx   . Rectal cancer Neg Hx   . Stomach cancer Neg Hx     Review of Systems  Constitutional: Negative for chills and fever.  Respiratory: Negative for cough and shortness of breath.   Cardiovascular: Negative for chest pain, palpitations and leg swelling.  Gastrointestinal: Negative for abdominal pain, nausea and vomiting.     OBJECTIVE:  Today's Vitals   05/10/19 1544  BP: 135/77  Pulse: 87  Resp: 16  Temp: 98.7 F (37.1 C)  TempSrc: Oral  SpO2: 97%  Weight: 127 lb (57.6 kg)  Height: '4\' 10"'$  (1.473 m)   Body mass index is 26.54 kg/m.  Wt Readings from Last 3 Encounters:  05/10/19 127 lb (57.6 kg)  02/01/19 129 lb 3.2 oz (58.6 kg)  11/02/18 130 lb 6.4 oz (59.1 kg)    Physical Exam Vitals signs and nursing note reviewed.  Constitutional:      Appearance: She is well-developed.  HENT:     Head: Normocephalic and atraumatic.     Mouth/Throat:     Pharynx: No oropharyngeal exudate.  Eyes:     General: No scleral icterus.    Conjunctiva/sclera: Conjunctivae normal.     Pupils: Pupils are equal, round, and reactive to light.  Neck:     Musculoskeletal: Neck supple.  Cardiovascular:     Rate and Rhythm: Normal rate and regular rhythm.     Heart sounds: Normal heart sounds. No murmur. No friction rub. No gallop.   Pulmonary:     Effort: Pulmonary effort is normal.     Breath sounds: Normal breath sounds. No wheezing or rales.  Skin:    General: Skin is warm and dry.  Neurological:     Mental Status: She is alert and oriented to person, place, and time.     ASSESSMENT and PLAN  1. Type 2 diabetes mellitus with hyperglycemia, with long-term current use of insulin (HCC) cbg log shows at goal. Cont current regime and LFM. A1c pending, meds to be adjusted as needed.  - Hemoglobin A1c  2. Pure  hypercholesterolemia Checking labs today, medications will be adjusted as needed.  - Lipid Panel  3. Essential hypertension Checking labs today, medications will be adjusted as needed.  - CMP14+EGFR  Return in about 6 months (around 11/09/2019).    Rutherford Guys,  MD Primary Care at [place], [place] [address] Ph.  [phone] Fax [phone]

## 2019-05-10 NOTE — Patient Instructions (Signed)
° ° ° °  If you have lab work done today you will be contacted with your lab results within the next 2 weeks.  If you have not heard from us then please contact us. The fastest way to get your results is to register for My Chart. ° ° °IF you received an x-ray today, you will receive an invoice from [facility]. Please contact [facility] at [phone] with questions or concerns regarding your invoice.  ° °IF you received labwork today, you will receive an invoice from [facility]. Please contact [facility] at [phone] with questions or concerns regarding your invoice.  ° °Our billing staff will not be able to assist you with questions regarding bills from these companies. ° °You will be contacted with the lab results as soon as they are available. The fastest way to get your results is to activate your My Chart account. Instructions are located on the last page of this paperwork. If you have not heard from us regarding the results in 2 weeks, please contact this office. °  ° ° ° °

## 2019-05-11 LAB — CMP14+EGFR
ALT: 9 IU/L (ref 0–32)
AST: 15 IU/L (ref 0–40)
Albumin/Globulin Ratio: 1.3 (ref 1.2–2.2)
Albumin: 4.5 g/dL (ref 3.8–4.8)
Alkaline Phosphatase: 93 IU/L (ref 39–117)
BUN/Creatinine Ratio: 13 (ref 12–28)
BUN: 11 mg/dL (ref 8–27)
Bilirubin Total: <0.2 mg/dL (ref 0.0–1.2)
CO2: 18 mmol/L — ABNORMAL LOW (ref 20–29)
Calcium: 10.1 mg/dL (ref 8.7–10.3)
Chloride: 96 mmol/L (ref 96–106)
Creatinine, Ser: 0.85 mg/dL (ref 0.57–1.00)
GFR calc Af Amer: 85 mL/min/{1.73_m2} (ref >59)
GFR calc non Af Amer: 74 mL/min/{1.73_m2} (ref >59)
Globulin, Total: 3.6 g/dL (ref 1.5–4.5)
Glucose: 120 mg/dL — ABNORMAL HIGH (ref 65–99)
Potassium: 4.5 mmol/L (ref 3.5–5.2)
Sodium: 135 mmol/L (ref 134–144)
Total Protein: 8.1 g/dL (ref 6.0–8.5)

## 2019-05-11 LAB — LIPID PANEL
Chol/HDL Ratio: 4.8 ratio — ABNORMAL HIGH (ref 0.0–4.4)
Cholesterol, Total: 183 mg/dL (ref 100–199)
HDL: 38 mg/dL — ABNORMAL LOW (ref >39)
LDL Calculated: 119 mg/dL — ABNORMAL HIGH (ref 0–99)
Triglycerides: 132 mg/dL (ref 0–149)
VLDL Cholesterol Cal: 26 mg/dL (ref 5–40)

## 2019-05-11 LAB — HEMOGLOBIN A1C
Est. average glucose Bld gHb Est-mCnc: 134 mg/dL
Hgb A1c MFr Bld: 6.3 % — ABNORMAL HIGH (ref 4.8–5.6)

## 2019-05-13 ENCOUNTER — Encounter: Payer: Self-pay | Admitting: Family Medicine

## 2019-09-12 ENCOUNTER — Telehealth: Payer: Self-pay | Admitting: Family Medicine

## 2019-09-12 NOTE — Telephone Encounter (Signed)
Pt called in end of day to check on status of prescription. Confirmed to pt that the msg was received and pending review

## 2019-09-12 NOTE — Telephone Encounter (Signed)
Copied from [place] [id number]. Topic: General - Inquiry >> Sep 12, 2019 10:51 AM Rutherford Nail, NT wrote: Reason for CRM: Patient calling and is requesting an antibiotic for a yeast infection. States that she is on dapagliflozin propanediol (FARXIGA) 10 MG TABS tablet and was told that vaginal yeast was something that this medication could cause. Please advise. [place], [place]

## 2019-09-16 ENCOUNTER — Ambulatory Visit: Payer: BC Managed Care – PPO | Admitting: Family Medicine

## 2019-09-16 ENCOUNTER — Other Ambulatory Visit: Payer: Self-pay

## 2019-09-16 ENCOUNTER — Encounter: Payer: Self-pay | Admitting: Family Medicine

## 2019-09-16 VITALS — BP 146/73 | HR 84 | Temp 98.6°F | Resp 16 | Ht 58.27 in | Wt 125.0 lb

## 2019-09-16 DIAGNOSIS — B3731 Acute candidiasis of vulva and vagina: Secondary | ICD-10-CM

## 2019-09-16 DIAGNOSIS — N898 Other specified noninflammatory disorders of vagina: Secondary | ICD-10-CM

## 2019-09-16 DIAGNOSIS — B373 Candidiasis of vulva and vagina: Secondary | ICD-10-CM | POA: Diagnosis not present

## 2019-09-16 DIAGNOSIS — R300 Dysuria: Secondary | ICD-10-CM

## 2019-09-16 LAB — POCT WET + KOH PREP: Trich by wet prep: ABSENT

## 2019-09-16 LAB — POCT URINALYSIS DIP (MANUAL ENTRY)
Bilirubin, UA: NEGATIVE
Glucose, UA: >=1000 mg/dL — AB
Ketones, POC UA: NEGATIVE mg/dL
Leukocytes, UA: NEGATIVE
Nitrite, UA: NEGATIVE
Protein Ur, POC: NEGATIVE mg/dL
Spec Grav, UA: 1.015 (ref 1.010–1.025)
Urobilinogen, UA: 0.2 E.U./dL
pH, UA: 5.5 (ref 5.0–8.0)

## 2019-09-16 MED ORDER — FLUCONAZOLE 150 MG PO TABS: 150.0000 mg | ORAL_TABLET | Freq: Once | ORAL | 0 refills | Status: AC

## 2019-09-16 NOTE — Progress Notes (Signed)
11/2/20203:02 PM  Gabrielle [name] February 07, 1956, 63 y.o., female [id number]  Chief Complaint  Patient presents with  . Vaginal Discharge    with some dysuria x 1 week     HPI:   Patient is a 63 y.o. female with past medical history significant for HTN, DM2, rectal cancer  who presents today for vaginal discharge and dysuria x 1 week  For past week she has been having white discharge, vaginal itchiness and irritation She has not been checking cbgs for the past month She is on SGLT2, first yeast infection since then Using vagisil, azo and clotrimazole with betamethasone Very uncomfortable   Lab Results  Component Value Date   HGBA1C 6.3 (H) 05/10/2019    Depression screen [facility] 2/9 09/16/2019 05/10/2019 03/07/2019  Decreased Interest 0 0 0  Down, Depressed, Hopeless 0 0 0  PHQ - 2 Score 0 0 0    Fall Risk  09/16/2019 05/10/2019 03/07/2019 02/19/2019 02/01/2019  Falls in the past year? 0 0 0 0 0  Number falls in past yr: 0 0 0 0 0  Injury with Fall? 0 0 0 0 0  Follow up - - - - Falls evaluation completed     No Known Allergies  Prior to Admission medications   Medication Sig Start Date End Date Taking? Authorizing Provider  amLODipine (NORVASC) 10 MG tablet Take 1 tablet (10 mg total) by mouth daily. 11/02/18  Yes Rutherford Guys, MD  aspirin 81 MG tablet Take 81 mg by mouth daily.   Yes [provider]  atorvastatin (LIPITOR) 20 MG tablet Take 1 tablet (20 mg total) by mouth every morning. 02/01/19  Yes Rutherford Guys, MD  dapagliflozin propanediol (FARXIGA) 10 MG TABS tablet Take 10 mg by mouth daily. 02/01/19  Yes Rutherford Guys, MD  glucose blood (ACCU-CHEK SMARTVIEW) test strip PATIENT NEEDS OFFICE VISIT FOR ADDITIONAL REFILLS - very overdue for DM check up. 11/11/15  Yes English, Colletta Maryland D, PA  lisinopril (PRINIVIL,ZESTRIL) 10 MG tablet Take 1 tablet (10 mg total) by mouth every morning. 11/02/18  Yes Rutherford Guys, MD  metFORMIN (GLUCOPHAGE) 1000 MG tablet  Take 1 tablet (1,000 mg total) by mouth 2 (two) times daily with a meal. 11/02/18  Yes Rutherford Guys, MD    Past Medical History:  Diagnosis Date  . Cancer ([place])    rectal CA  . Cataract   . Eczema   . Hyperlipidemia   . Hypertension   . Intrinsic (urethral) sphincter deficiency (ISD)   . RBBB   . SUI (stress urinary incontinence, female)    hx of  . Type 2 diabetes mellitus ([place])   . Wears dentures    UPPER  . Wears glasses     Past Surgical History:  Procedure Laterality Date  . ABDOMINAL HYSTERECTOMY  1998   fibroids, partial  . ABDOMINOPLASTY  1999  . BLADDER SUSPENSION N/A 03/06/2014   Procedure: [facility] SLING ;  Surgeon: Irine Seal, MD;  Location: [facility];  Service: Urology;  Laterality: N/A;  . CARDIOVASCULAR STRESS TEST  03-27-2008   NORMAL PERFUSION STUDY/  EF 54%  . COLON SURGERY  2010   for rectal ca  . COLONOSCOPY    . EUS N/A 03/02/2017   Procedure: LOWER ENDOSCOPIC ULTRASOUND (EUS);  Surgeon: Milus Banister, MD;  Location[facility];  Service: Endoscopy;  Laterality: N/A;  . EXCISION LOWER ABDOMINAL SCAR POST ABDOMINOPLASTY  01-05-2000  . ORIF RIGHT ANKLE FX  12-24-2008  . RIGHT ANKLE ARTHROSCOPY W/ DEBRIDEMENT AND OPEN REMOVAL HARDWARE  03-12-2010  . TRANSTHORACIC ECHOCARDIOGRAM  08-05-2009   MILD LVH/  EF 60-65%  . TUBAL LIGATION      Social History   Tobacco Use  . Smoking status: Former Smoker    Years: 20.00    Types: Cigarettes    Quit date: 11/14/2006    Years since quitting: 12.8  . Smokeless tobacco: Never Used  Substance Use Topics  . Alcohol use: No    Family History  Problem Relation Age of Onset  . Diabetes Mother   . Hypertension Mother   . Diabetes Sister   . Hypertension Sister   . Diabetes Brother   . Hypertension Brother   . Lupus Daughter   . Colon cancer Neg Hx   . Esophageal cancer Neg Hx   . Rectal cancer Neg Hx   . Stomach cancer Neg Hx     ROS Per hpi  OBJECTIVE:  Today's Vitals    09/16/19 1501  BP: (!) 146/73  Pulse: 84  Resp: 16  Temp: 98.6 F (37 C)  TempSrc: Oral  SpO2: 96%  Weight: 125 lb (56.7 kg)  Height: 4' 10.27" (1.48 m)   Body mass index is 25.89 kg/m.   Physical Exam  Results for orders placed or performed in visit on 09/16/19 (from the past 24 hour(s))  POCT urinalysis dipstick     Status: Abnormal   Collection Time: 09/16/19  3:12 PM  Result Value Ref Range   Color, UA yellow yellow   Clarity, UA clear clear   Glucose, UA >=1,000 (A) negative mg/dL   Bilirubin, UA negative negative   Ketones, POC UA negative negative mg/dL   Spec Grav, UA 1.015 1.010 - 1.025   Blood, UA small (A) negative   pH, UA 5.5 5.0 - 8.0   Protein Ur, POC negative negative mg/dL   Urobilinogen, UA 0.2 0.2 or 1.0 E.U./dL   Nitrite, UA Negative Negative   Leukocytes, UA Negative Negative  POCT Wet + KOH Prep     Status: Abnormal   Collection Time: 09/16/19  3:21 PM  Result Value Ref Range   Yeast by KOH Present (A) Absent   Yeast by wet prep Present (A) Absent   WBC by wet prep Few Few   Clue Cells Wet Prep HPF POC Few (A) None   Trich by wet prep Absent Absent   Bacteria Wet Prep HPF POC Moderate (A) Few   Epithelial Cells By [employer] Pref ([facility]FC) Moderate (A) None, Few, Too numerous to count   RBC,UR,HPF,POC None None RBC/hpf    No results found.   ASSESSMENT and PLAN  1. Vaginal yeast infection Discussed supportive measures. rx for diflucan given. Cont to monitor frequency as on SGLTw  2. Vaginal discharge - POCT Wet + KOH Prep  3. Dysuria - POCT urinalysis dipstick  Other orders - fluconazole (DIFLUCAN) 150 MG tablet; Take 1 tablet (150 mg total) by mouth once for 1 dose. Repeat if needed  Return for dec for DM.    Rutherford Guys, MD Primary Care at [place], [place] [address] Ph.  [phone] Fax [phone]

## 2019-09-16 NOTE — Patient Instructions (Signed)
° ° ° °  If you have lab work done today you will be contacted with your lab results within the next 2 weeks.  If you have not heard from us then please contact us. The fastest way to get your results is to register for My Chart. ° ° °IF you received an x-ray today, you will receive an invoice from [facility]. Please contact [facility] at [phone] with questions or concerns regarding your invoice.  ° °IF you received labwork today, you will receive an invoice from [facility]. Please contact [facility] at [phone] with questions or concerns regarding your invoice.  ° °Our billing staff will not be able to assist you with questions regarding bills from these companies. ° °You will be contacted with the lab results as soon as they are available. The fastest way to get your results is to activate your My Chart account. Instructions are located on the last page of this paperwork. If you have not heard from us regarding the results in 2 weeks, please contact this office. °  ° ° ° °

## 2019-09-18 ENCOUNTER — Encounter: Payer: Self-pay | Admitting: Family Medicine

## 2019-11-04 ENCOUNTER — Encounter: Payer: Self-pay | Admitting: Family Medicine

## 2019-11-04 ENCOUNTER — Other Ambulatory Visit: Payer: Self-pay

## 2019-11-04 ENCOUNTER — Ambulatory Visit: Payer: BC Managed Care – PPO | Admitting: Family Medicine

## 2019-11-04 VITALS — BP 127/82 | HR 86 | Temp 97.9°F | Ht 58.27 in | Wt 124.6 lb

## 2019-11-04 DIAGNOSIS — E7800 Pure hypercholesterolemia, unspecified: Secondary | ICD-10-CM | POA: Diagnosis not present

## 2019-11-04 DIAGNOSIS — I10 Essential (primary) hypertension: Secondary | ICD-10-CM | POA: Diagnosis not present

## 2019-11-04 DIAGNOSIS — Z86010 Personal history of colonic polyps: Secondary | ICD-10-CM

## 2019-11-04 DIAGNOSIS — D3A026 Benign carcinoid tumor of the rectum: Secondary | ICD-10-CM | POA: Diagnosis not present

## 2019-11-04 DIAGNOSIS — Z794 Long term (current) use of insulin: Secondary | ICD-10-CM

## 2019-11-04 DIAGNOSIS — E1165 Type 2 diabetes mellitus with hyperglycemia: Secondary | ICD-10-CM | POA: Diagnosis not present

## 2019-11-04 NOTE — Progress Notes (Signed)
12/21/20203:10 PM  CAMBRIA UMEDA 06/24/1956, 63 y.o., female [id number]  Chief Complaint  Patient presents with  . Follow-up    diabetes, hlp, htn  . Encopresis    this has been going on for 6 months now.    HPI:   Patient is a 63 y.o. female with past medical history significant for HTN, DM2, carcinoid rectal cancer who presents today for followup  Last OV June 2020 - no changes Has been overall doing well Decreased appetite and weight loss since started on farxiga Denies polyuria or polydipisia Does not check cbgs  Otherwise having issues with loss of bowel control H/o recurrent rectal carcinoid Last colonoscopy 2018 - tubular adenoma polyps and low grade neuroendocrine rectal tumor, normal lower GI [place], was supposed to have flex sig in 2019 - did not happen Patient had normal cologuard in 2019 - ordered by prior PCP   Lab Results  Component Value Date   HGBA1C 6.3 (H) 05/10/2019   HGBA1C 7.8 (A) 02/01/2019   HGBA1C 11.4 (H) 11/02/2018   Lab Results  Component Value Date   MICROALBUR 1.2 11/05/2014   LDLCALC 119 (H) 05/10/2019   CREATININE 0.85 05/10/2019    Depression screen PHQ 2/9 11/04/2019 09/16/2019 05/10/2019  Decreased Interest 0 0 0  Down, Depressed, Hopeless 0 0 0  PHQ - 2 Score 0 0 0    Fall Risk  11/04/2019 09/16/2019 05/10/2019 03/07/2019 02/19/2019  Falls in the past year? 0 0 0 0 0  Number falls in past yr: 0 0 0 0 0  Injury with Fall? 0 0 0 0 0  Follow up - - - - -     No Known Allergies  Prior to Admission medications   Medication Sig Start Date End Date Taking? Authorizing Provider  amLODipine (NORVASC) 10 MG tablet Take 1 tablet (10 mg total) by mouth daily. 11/02/18  Yes Rutherford Guys, MD  aspirin 81 MG tablet Take 81 mg by mouth daily.   Yes [provider]  atorvastatin (LIPITOR) 20 MG tablet Take 1 tablet (20 mg total) by mouth every morning. 02/01/19  Yes Rutherford Guys, MD  dapagliflozin propanediol (FARXIGA) 10 MG  TABS tablet Take 10 mg by mouth daily. 02/01/19  Yes Rutherford Guys, MD  glucose blood (ACCU-CHEK SMARTVIEW) test strip PATIENT NEEDS OFFICE VISIT FOR ADDITIONAL REFILLS - very overdue for DM check up. 11/11/15  Yes English, Colletta Maryland D, PA  lisinopril (PRINIVIL,ZESTRIL) 10 MG tablet Take 1 tablet (10 mg total) by mouth every morning. 11/02/18  Yes Rutherford Guys, MD  metFORMIN (GLUCOPHAGE) 1000 MG tablet Take 1 tablet (1,000 mg total) by mouth 2 (two) times daily with a meal. 11/02/18  Yes Rutherford Guys, MD    Past Medical History:  Diagnosis Date  . Cancer ([place])    rectal CA  . Cataract   . Eczema   . Hyperlipidemia   . Hypertension   . Intrinsic (urethral) sphincter deficiency (ISD)   . RBBB   . SUI (stress urinary incontinence, female)    hx of  . Type 2 diabetes mellitus ([place])   . Wears dentures    UPPER  . Wears glasses     Past Surgical History:  Procedure Laterality Date  . ABDOMINAL HYSTERECTOMY  1998   fibroids, partial  . ABDOMINOPLASTY  1999  . BLADDER SUSPENSION N/A 03/06/2014   Procedure: [facility] SLING ;  Surgeon: Irine Seal, MD;  Location: [facility];  Service:  Urology;  Laterality: N/A;  . CARDIOVASCULAR STRESS TEST  03-27-2008   NORMAL PERFUSION STUDY/  EF 54%  . COLON SURGERY  2010   for rectal ca  . COLONOSCOPY    . EUS N/A 03/02/2017   Procedure: LOWER ENDOSCOPIC ULTRASOUND (EUS);  Surgeon: Milus Banister, MD;  Location[facility];  Service: Endoscopy;  Laterality: N/A;  . EXCISION LOWER ABDOMINAL SCAR POST ABDOMINOPLASTY  01-05-2000  . ORIF RIGHT ANKLE FX  12-24-2008  . RIGHT ANKLE ARTHROSCOPY W/ DEBRIDEMENT AND OPEN REMOVAL HARDWARE  03-12-2010  . TRANSTHORACIC ECHOCARDIOGRAM  08-05-2009   MILD LVH/  EF 60-65%  . TUBAL LIGATION      Social History   Tobacco Use  . Smoking status: Former Smoker    Years: 20.00    Types: Cigarettes    Quit date: 11/14/2006    Years since quitting: 12.9  . Smokeless tobacco: Never Used    Substance Use Topics  . Alcohol use: No    Family History  Problem Relation Age of Onset  . Diabetes Mother   . Hypertension Mother   . Diabetes Sister   . Hypertension Sister   . Diabetes Brother   . Hypertension Brother   . Lupus Daughter   . Colon cancer Neg Hx   . Esophageal cancer Neg Hx   . Rectal cancer Neg Hx   . Stomach cancer Neg Hx     Review of Systems  Constitutional: Negative for chills and fever.  Respiratory: Negative for cough and shortness of breath.   Cardiovascular: Negative for chest pain, palpitations and leg swelling.  Gastrointestinal: Positive for diarrhea. Negative for abdominal pain, blood in stool, melena, nausea and vomiting.     OBJECTIVE:  Today's Vitals   11/04/19 1501  BP: 127/82  Pulse: 86  Temp: 97.9 F (36.6 C)  SpO2: 95%  Weight: 124 lb 9.6 oz (56.5 kg)  Height: 4' 10.27" (1.48 m)   Body mass index is 25.8 kg/m.  Wt Readings from Last 3 Encounters:  11/04/19 124 lb 9.6 oz (56.5 kg)  09/16/19 125 lb (56.7 kg)  05/10/19 127 lb (57.6 kg)    Physical Exam Vitals and nursing note reviewed.  Constitutional:      Appearance: She is well-developed.  HENT:     Head: Normocephalic and atraumatic.     Mouth/Throat:     Pharynx: No oropharyngeal exudate.  Eyes:     General: No scleral icterus.    Conjunctiva/sclera: Conjunctivae normal.     Pupils: Pupils are equal, round, and reactive to light.  Cardiovascular:     Rate and Rhythm: Normal rate and regular rhythm.     Heart sounds: Normal heart sounds. No murmur. No friction rub. No gallop.   Pulmonary:     Effort: Pulmonary effort is normal.     Breath sounds: Normal breath sounds. No wheezing or rales.  Musculoskeletal:     Cervical back: Neck supple.  Skin:    General: Skin is warm and dry.  Neurological:     Mental Status: She is alert and oriented to person, place, and time.     No results found for this or any previous visit (from the past 24 hour(s)).  No  results found.   ASSESSMENT and PLAN  1. Type 2 diabetes mellitus with hyperglycemia, with long-term current use of insulin ([place]) Checking labs today, medications will be adjusted as needed.  - Microalbumin / creatinine urine ratio - TSH - Lipid panel - CMET  with GFR - Hemoglobin A1c  2. Essential hypertension Controlled. Continue current regime.  - TSH - Lipid panel - CMET with GFR  3. Pure hypercholesterolemia Checking labs today, medications will be adjusted as needed.  - TSH - Lipid panel - CMET with GFR  4. Carcinoid tumor of rectum, unspecified whether malignant - Ambulatory referral to Gastroenterology  5. History of colonic polyps - Ambulatory referral to Gastroenterology  Return in about 3 months (around 02/02/2020).    Rutherford Guys, MD Primary Care at [place], [place] [address] Ph.  [phone] Fax [phone]

## 2019-11-04 NOTE — Patient Instructions (Signed)
° ° ° °  If you have lab work done today you will be contacted with your lab results within the next 2 weeks.  If you have not heard from us then please contact us. The fastest way to get your results is to register for My Chart. ° ° °IF you received an x-ray today, you will receive an invoice from [facility]. Please contact [facility] at [phone] with questions or concerns regarding your invoice.  ° °IF you received labwork today, you will receive an invoice from [facility]. Please contact [facility] at [phone] with questions or concerns regarding your invoice.  ° °Our billing staff will not be able to assist you with questions regarding bills from these companies. ° °You will be contacted with the lab results as soon as they are available. The fastest way to get your results is to activate your My Chart account. Instructions are located on the last page of this paperwork. If you have not heard from us regarding the results in 2 weeks, please contact this office. °  ° ° ° °

## 2019-11-05 ENCOUNTER — Other Ambulatory Visit: Payer: Self-pay | Admitting: Family Medicine

## 2019-11-05 DIAGNOSIS — E7800 Pure hypercholesterolemia, unspecified: Secondary | ICD-10-CM

## 2019-11-05 LAB — TSH: TSH: 1.09 u[IU]/mL (ref 0.450–4.500)

## 2019-11-05 LAB — CMP14+EGFR
ALT: 10 IU/L (ref 0–32)
AST: 16 IU/L (ref 0–40)
Albumin/Globulin Ratio: 1.3 (ref 1.2–2.2)
Albumin: 4.7 g/dL (ref 3.8–4.8)
Alkaline Phosphatase: 116 IU/L (ref 39–117)
BUN/Creatinine Ratio: 15 (ref 12–28)
BUN: 13 mg/dL (ref 8–27)
Bilirubin Total: 0.3 mg/dL (ref 0.0–1.2)
CO2: 21 mmol/L (ref 20–29)
Calcium: 10.8 mg/dL — ABNORMAL HIGH (ref 8.7–10.3)
Chloride: 96 mmol/L (ref 96–106)
Creatinine, Ser: 0.84 mg/dL (ref 0.57–1.00)
GFR calc Af Amer: 86 mL/min/{1.73_m2} (ref >59)
GFR calc non Af Amer: 75 mL/min/{1.73_m2} (ref >59)
Globulin, Total: 3.5 g/dL (ref 1.5–4.5)
Glucose: 88 mg/dL (ref 65–99)
Potassium: 4.8 mmol/L (ref 3.5–5.2)
Sodium: 137 mmol/L (ref 134–144)
Total Protein: 8.2 g/dL (ref 6.0–8.5)

## 2019-11-05 LAB — LIPID PANEL
Chol/HDL Ratio: 5.1 ratio — ABNORMAL HIGH (ref 0.0–4.4)
Cholesterol, Total: 215 mg/dL — ABNORMAL HIGH (ref 100–199)
HDL: 42 mg/dL (ref >39)
LDL Chol Calc (NIH): 126 mg/dL — ABNORMAL HIGH (ref 0–99)
Triglycerides: 265 mg/dL — ABNORMAL HIGH (ref 0–149)
VLDL Cholesterol Cal: 47 mg/dL — ABNORMAL HIGH (ref 5–40)

## 2019-11-05 LAB — MICROALBUMIN / CREATININE URINE RATIO
Creatinine, Urine: 67.8 mg/dL
Microalb/Creat Ratio: 33 mg/g creat — ABNORMAL HIGH (ref 0–29)
Microalbumin, Urine: 22.1 ug/mL

## 2019-11-05 LAB — HEMOGLOBIN A1C
Est. average glucose Bld gHb Est-mCnc: 154 mg/dL
Hgb A1c MFr Bld: 7 % — ABNORMAL HIGH (ref 4.8–5.6)

## 2019-11-05 MED ORDER — SITAGLIPTIN PHOSPHATE 100 MG PO TABS: 100.0000 mg | ORAL_TABLET | Freq: Every day | ORAL | 1 refills | Status: DC

## 2019-11-05 MED ORDER — ATORVASTATIN CALCIUM 40 MG PO TABS: 40.0000 mg | ORAL_TABLET | Freq: Every morning | ORAL | 3 refills | Status: DC

## 2019-11-12 ENCOUNTER — Telehealth: Payer: Self-pay

## 2019-11-12 NOTE — Telephone Encounter (Signed)
Milus Banister, MD  Mansouraty, Telford Nab., MD; Timothy Lasso, RN; Havery Moros, Carlota Raspberry, MD  Dr. Havery Moros referred her to me in 2018 for recurrent rectal carcinoid. EUS in 2018 showed essentially a normal rectum and Richardson Landry and I planned that he would repeat flex sig in one year. Recall was entered. He sent her a reminder letter in 2019 about the flex sig telling her it was necessary however she never contacted [place] to schedule it.    I think the best course of action is still a repeat flex sigmoidoscopy. [facility] ok, with Dr. Havery Moros OK.   I will include Richardson Landry on this chain.    Richardson Landry,  Let me know how the rectum looks. Thanks   DJ

## 2019-11-18 ENCOUNTER — Other Ambulatory Visit: Payer: Self-pay | Admitting: Family Medicine

## 2019-11-18 DIAGNOSIS — Z0000 Encounter for general adult medical examination without abnormal findings: Secondary | ICD-10-CM

## 2019-11-18 DIAGNOSIS — I10 Essential (primary) hypertension: Secondary | ICD-10-CM

## 2019-11-19 NOTE — Telephone Encounter (Signed)
Requested Prescriptions  Pending Prescriptions Disp Refills  . lisinopril (ZESTRIL) 10 MG tablet [Pharmacy Med Name: Lisinopril 10 MG Oral Tablet] 90 tablet 0    Sig: TAKE 1 TABLET BY MOUTH ONCE DAILY IN THE MORNING     Cardiovascular:  ACE Inhibitors Passed - 11/18/2019  7:40 PM      Passed - Cr in normal range and within 180 days    Creat  Date Value Ref Range Status  11/11/2015 0.74 0.50 - 1.05 mg/dL Final   Creatinine, Ser  Date Value Ref Range Status  11/04/2019 0.84 0.57 - 1.00 mg/dL Final         Passed - K in normal range and within 180 days    Potassium  Date Value Ref Range Status  11/04/2019 4.8 3.5 - 5.2 mmol/L Final         Passed - Patient is not pregnant      Passed - Last BP in normal range    BP Readings from Last 1 Encounters:  11/04/19 127/82         Passed - Valid encounter within last 6 months    Recent Outpatient Visits          2 weeks ago Type 2 diabetes mellitus with hyperglycemia, with long-term current use of insulin ([place])   [facility], Lilia Argue, MD   2 months ago Vaginal yeast infection   [facility], Lilia Argue, MD   6 months ago Type 2 diabetes mellitus with hyperglycemia, with long-term current use of insulin [facility])   [facility], Lilia Argue, MD   8 months ago Hypoglycemia associated with type 2 diabetes mellitus [facility])   [facility], Lilia Argue, MD   9 months ago Vaginal itching   Primary Care at [facility], Arlie Solomons, MD      Future Appointments            In 2 months Rutherford Guys, MD Primary Care at [place], [place]           . amLODipine ([place]) 10 MG tablet [Pharmacy Med Name: amLODIPine Besylate 10 MG Oral Tablet] 90 tablet 0    Sig: Take 1 tablet by mouth once daily     Cardiovascular:  Calcium Channel Blockers Passed - 11/18/2019  7:40 PM      Passed - Last BP in normal range    BP Readings from Last 1 Encounters:  11/04/19 127/82         Passed - Valid  encounter within last 6 months    Recent Outpatient Visits          2 weeks ago Type 2 diabetes mellitus with hyperglycemia, with long-term current use of insulin ([place])   [facility] at [facility], Lilia Argue, MD   2 months ago Vaginal yeast infection   [facility], Lilia Argue, MD   6 months ago Type 2 diabetes mellitus with hyperglycemia, with long-term current use of insulin [facility])   [facility], Lilia Argue, MD   8 months ago Hypoglycemia associated with type 2 diabetes mellitus [facility])   [facility], Lilia Argue, MD   9 months ago Vaginal itching   [facility], Arlie Solomons, MD      Future Appointments            In 2 months Rutherford Guys, MD Primary Care  at [place], [facility]

## 2019-11-27 ENCOUNTER — Other Ambulatory Visit: Payer: Self-pay

## 2019-11-27 ENCOUNTER — Encounter: Payer: Self-pay | Admitting: Gastroenterology

## 2019-11-27 ENCOUNTER — Ambulatory Visit (AMBULATORY_SURGERY_CENTER): Payer: Self-pay | Admitting: *Deleted

## 2019-11-27 VITALS — Temp 96.9°F | Ht <= 58 in | Wt 126.0 lb

## 2019-11-27 DIAGNOSIS — Z86010 Personal history of colonic polyps: Secondary | ICD-10-CM

## 2019-11-27 DIAGNOSIS — Z01818 Encounter for other preprocedural examination: Secondary | ICD-10-CM

## 2019-11-27 NOTE — Progress Notes (Signed)
Patient is here in-person for PV. Patient denies any allergies to eggs or soy. Patient denies any problems with anesthesia/sedation. Patient denies any oxygen use at home. Patient denies taking any diet/weight loss medications or blood thinners. Patient is not being treated for MRSA or C-diff.   COVID-19 screening test is on 11/29/2019 at 330pm, the pt is aware. Pt is aware that care partner will wait in the car during procedure; if they feel like they will be too hot or cold to wait in the car; they may wait in the 4 th floor lobby. Patient is aware to bring only one care partner. We want them to wear a mask (we do not have any that we can provide them), practice social distancing, and we will check their temperatures when they get here.  I did remind the patient that their care partner needs to stay in the parking lot the entire time and have a cell phone available, we will call them when the pt is ready for discharge. Patient will wear mask into building.

## 2019-11-29 ENCOUNTER — Ambulatory Visit (INDEPENDENT_AMBULATORY_CARE_PROVIDER_SITE_OTHER): Payer: BC Managed Care – PPO

## 2019-11-29 DIAGNOSIS — Z1159 Encounter for screening for other viral diseases: Secondary | ICD-10-CM

## 2019-12-02 LAB — SARS CORONAVIRUS 2 (TAT 6-24 HRS): SARS Coronavirus 2: NEGATIVE

## 2019-12-03 ENCOUNTER — Encounter: Payer: Self-pay | Admitting: Gastroenterology

## 2019-12-03 ENCOUNTER — Ambulatory Visit (AMBULATORY_SURGERY_CENTER): Payer: BC Managed Care – PPO | Admitting: Gastroenterology

## 2019-12-03 ENCOUNTER — Other Ambulatory Visit: Payer: Self-pay

## 2019-12-03 VITALS — BP 133/67 | HR 77 | Temp 98.4°F | Resp 16 | Ht 58.27 in | Wt 126.0 lb

## 2019-12-03 DIAGNOSIS — D3A026 Benign carcinoid tumor of the rectum: Secondary | ICD-10-CM

## 2019-12-03 DIAGNOSIS — D128 Benign neoplasm of rectum: Secondary | ICD-10-CM

## 2019-12-03 DIAGNOSIS — D129 Benign neoplasm of anus and anal canal: Secondary | ICD-10-CM

## 2019-12-03 DIAGNOSIS — D3A8 Other benign neuroendocrine tumors: Secondary | ICD-10-CM

## 2019-12-03 MED ORDER — SODIUM CHLORIDE 0.9 % IV SOLN: 500.0000 mL | Freq: Once | INTRAVENOUS | Status: DC

## 2019-12-03 NOTE — Op Note (Signed)
[place] Patient Name: Gabrielle [name] Procedure Date: 12/03/2019 9:47 AM MRN: [id number] Endoscopist: Remo Lipps P. Havery Moros , MD Age: 64 Referring MD:  Date of Birth: 1955/12/15 Gender: Female Account #: [id number] Procedure:                Flexible Sigmoidoscopy Indications:              Personal history of benign rectal carcinoids x 2 -                            (2011, 2018) - EUS in 2018 showed no residual                            carcinoids Medicines:                Monitored Anesthesia Care Procedure:                Pre-Anesthesia Assessment:                           - Prior to the procedure, a History and Physical                            was performed, and patient medications and                            allergies were reviewed. The patient's tolerance of                            previous anesthesia was also reviewed. The risks                            and benefits of the procedure and the sedation                            options and risks were discussed with the patient.                            All questions were answered, and informed consent                            was obtained. Prior Anticoagulants: The patient has                            taken no previous anticoagulant or antiplatelet                            agents. ASA Grade Assessment: II - A patient with                            mild systemic disease. After reviewing the risks                            and benefits, the patient was deemed in  satisfactory condition to undergo the procedure.                           After obtaining informed consent, the scope was                            passed under direct vision. The Endoscope was                            introduced through the anus and advanced to the the                            sigmoid colon. The flexible sigmoidoscopy was                            accomplished without difficulty. The  patient                            tolerated the procedure well. The quality of the                            bowel preparation was fair. Scope In: Scope Out: Findings:                 The perianal and digital rectal examinations were                            normal.                           Two sessile polyps were found in the distal rectum                            with a whitish hue, concerning for possible rectal                            carcinoids. The polyps were 2 to 3 mm in size,                            located within a few cm of the dentate line. These                            polyps were removed with a hot snare. Resection and                            retrieval were complete. Area just distal to the                            lesions was tattooed with an injection of Spot                            (carbon black).  The exam was otherwise normal throughout the                            examined colon (distal sigmoid colon through rest                            of the rectum). Old scar from prior carcinoid                            removal was noted. Complications:            No immediate complications. Estimated blood loss:                            Minimal. Estimated Blood Loss:     Estimated blood loss was minimal. Impression:               - Preparation of the colon was fair.                           - Two 2 to 3 mm polyps in the distal rectum,                            possible carcinoids, removed with a hot snare.                            Resected and retrieved. Tattooed. Recommendation:           - Discharge patient to home.                           - Resume previous diet.                           - Await pathology results with further                            recommendations. Remo Lipps P. Victorina Kable, MD 12/03/2019 10:17:44 AM This report has been signed electronically.

## 2019-12-03 NOTE — Progress Notes (Signed)
Called to room to assist during endoscopic procedure.  Patient ID and intended procedure confirmed with present staff. Received instructions for my participation in the procedure from the performing physician.  

## 2019-12-03 NOTE — Progress Notes (Addendum)
[place]   Pt's states no medical or surgical changes since previsit or office visit.

## 2019-12-03 NOTE — Patient Instructions (Signed)
YOU HAD AN ENDOSCOPIC PROCEDURE TODAY AT THE  ENDOSCOPY CENTER:   Refer to the procedure report that was given to you for any specific questions about what was found during the examination.  If the procedure report does not answer your questions, please call your gastroenterologist to clarify.  If you requested that your care partner not be given the details of your procedure findings, then the procedure report has been included in a sealed envelope for you to review at your convenience later.  YOU SHOULD EXPECT: Some feelings of bloating in the abdomen. Passage of more gas than usual.  Walking can help get rid of the air that was put into your GI tract during the procedure and reduce the bloating. If you had a lower endoscopy (such as a colonoscopy or flexible sigmoidoscopy) you may notice spotting of blood in your stool or on the toilet paper. If you underwent a bowel prep for your procedure, you may not have a normal bowel movement for a few days.  Please Note:  You might notice some irritation and congestion in your nose or some drainage.  This is from the oxygen used during your procedure.  There is no need for concern and it should clear up in a day or so.  SYMPTOMS TO REPORT IMMEDIATELY:   Following lower endoscopy (colonoscopy or flexible sigmoidoscopy):  Excessive amounts of blood in the stool  Significant tenderness or worsening of abdominal pains  Swelling of the abdomen that is new, acute  Fever of 100F or higher   For urgent or emergent issues, a gastroenterologist can be reached at any hour by calling [phone].   DIET:  We do recommend a small meal at first, but then you may proceed to your regular diet.  Drink plenty of fluids but you should avoid alcoholic beverages for 24 hours.  MEDICATIONS: Continue present medications.  Please see handouts given to you by your recovery nurse.  ACTIVITY:  You should plan to take it easy for the rest of today and you should  NOT DRIVE or use heavy machinery until tomorrow (because of the sedation medicines used during the test).    FOLLOW UP: Our staff will call the number listed on your records 48-72 hours following your procedure to check on you and address any questions or concerns that you may have regarding the information given to you following your procedure. If we do not reach you, we will leave a message.  We will attempt to reach you two times.  During this call, we will ask if you have developed any symptoms of COVID 19. If you develop any symptoms (ie: fever, flu-like symptoms, shortness of breath, cough etc.) before then, please call [phone].  If you test positive for Covid 19 in the 2 weeks post procedure, please call and report this information to us.    If any biopsies were taken you will be contacted by phone or by letter within the next 1-3 weeks.  Please call us at [phone] if you have not heard about the biopsies in 3 weeks.   Thank you for allowing us to provide for your healthcare needs today.   SIGNATURES/CONFIDENTIALITY: You and/or your care partner have signed paperwork which will be entered into your electronic medical record.  These signatures attest to the fact that that the information above on your After Visit Summary has been reviewed and is understood.  Full responsibility of the confidentiality of this discharge information lies with you and/or   your care-partner. 

## 2019-12-03 NOTE — Progress Notes (Signed)
Report to PACU, RN, vss, BBS= Clear.  

## 2019-12-05 ENCOUNTER — Telehealth: Payer: Self-pay

## 2019-12-05 NOTE — Telephone Encounter (Signed)
  Follow up Call-  Call back number 12/03/2019  Post procedure Call Back phone  # [phone] cell  Permission to leave phone message Yes  Some recent data might be hidden     Patient questions:  Do you have a fever, pain , or abdominal swelling? No. Pain Score  0 *  Have you tolerated food without any problems? Yes.    Have you been able to return to your normal activities? Yes.    Do you have any questions about your discharge instructions: Diet   No. Medications  No. Follow up visit  No.  Do you have questions or concerns about your Care? No.  Actions: * If pain score is 4 or above: No action needed, pain <4.  1. Have you developed a fever since your procedure? no  2.   Have you had an respiratory symptoms (SOB or cough) since your procedure? no  3.   Have you tested positive for COVID 19 since your procedure no  4.   Have you had any family members/close contacts diagnosed with the COVID 19 since your procedure?  no   If yes to any of these questions please route to Joylene John, RN and Alphonsa Gin, Therapist, sports.

## 2019-12-12 ENCOUNTER — Other Ambulatory Visit: Payer: Self-pay | Admitting: Gastroenterology

## 2019-12-12 DIAGNOSIS — D3A026 Benign carcinoid tumor of the rectum: Secondary | ICD-10-CM

## 2019-12-26 ENCOUNTER — Other Ambulatory Visit: Payer: Self-pay | Admitting: Family Medicine

## 2019-12-26 DIAGNOSIS — Z0000 Encounter for general adult medical examination without abnormal findings: Secondary | ICD-10-CM

## 2019-12-26 DIAGNOSIS — E1165 Type 2 diabetes mellitus with hyperglycemia: Secondary | ICD-10-CM

## 2019-12-26 DIAGNOSIS — Z794 Long term (current) use of insulin: Secondary | ICD-10-CM

## 2019-12-26 NOTE — Telephone Encounter (Signed)
Requested Prescriptions  Pending Prescriptions Disp Refills  . metFORMIN (GLUCOPHAGE) 1000 MG tablet [Pharmacy Med Name: metFORMIN HCl 1000 MG Oral Tablet] 180 tablet 0    Sig: TAKE 1 TABLET BY MOUTH TWICE DAILY WITH A MEAL     Endocrinology:  Diabetes - Biguanides Passed - 12/26/2019 10:01 AM      Passed - Cr in normal range and within 360 days    Creat  Date Value Ref Range Status  11/11/2015 0.74 0.50 - 1.05 mg/dL Final   Creatinine, Ser  Date Value Ref Range Status  11/04/2019 0.84 0.57 - 1.00 mg/dL Final         Passed - HBA1C is between 0 and 7.9 and within 180 days    Hgb A1c MFr Bld  Date Value Ref Range Status  11/04/2019 7.0 (H) 4.8 - 5.6 % Final    Comment:             Prediabetes: 5.7 - 6.4          Diabetes: >6.4          Glycemic control for adults with diabetes: <7.0          Passed - eGFR in normal range and within 360 days    GFR, Est African American  Date Value Ref Range Status  11/11/2015 >89 >=60 mL/min Final   GFR calc Af Amer  Date Value Ref Range Status  11/04/2019 86 >59 mL/min/1.73 Final   GFR, Est Non African American  Date Value Ref Range Status  11/11/2015 >89 >=60 mL/min Final    Comment:      The estimated GFR is a calculation valid for adults (>=104 years old) that uses the CKD-EPI algorithm to adjust for age and sex. It is   not to be used for children, pregnant women, hospitalized patients,    patients on dialysis, or with rapidly changing kidney function. According to the NKDEP, eGFR >89 is normal, 60-89 shows mild impairment, 30-59 shows moderate impairment, 15-29 shows severe impairment and <15 is ESRD.      GFR calc non Af Amer  Date Value Ref Range Status  11/04/2019 75 >59 mL/min/1.73 Final         Passed - Valid encounter within last 6 months    Recent Outpatient Visits          1 month ago Type 2 diabetes mellitus with hyperglycemia, with long-term current use of insulin [facility])   [facility], Lilia Argue, MD   3 months ago Vaginal yeast infection   [facility], Lilia Argue, MD   7 months ago Type 2 diabetes mellitus with hyperglycemia, with long-term current use of insulin [facility])   [facility], Lilia Argue, MD   9 months ago Hypoglycemia associated with type 2 diabetes mellitus [facility])   [facility], Lilia Argue, MD   10 months ago Vaginal itching   Primary Care at [facility], Arlie Solomons, MD      Future Appointments            In 1 month Pamella Pert, Lilia Argue, MD Primary Care at [place], [facility]

## 2020-01-04 ENCOUNTER — Other Ambulatory Visit: Payer: Self-pay | Admitting: Family Medicine

## 2020-01-04 NOTE — Telephone Encounter (Signed)
Requested Prescriptions  Pending Prescriptions Disp Refills  . JANUVIA 100 MG tablet [Pharmacy Med Name: Januvia 100 MG Oral Tablet] 30 tablet 0    Sig: Take 1 tablet by mouth once daily     Endocrinology:  Diabetes - DPP-4 Inhibitors Passed - 01/04/2020 12:04 PM      Passed - HBA1C is between 0 and 7.9 and within 180 days    Hgb A1c MFr Bld  Date Value Ref Range Status  11/04/2019 7.0 (H) 4.8 - 5.6 % Final    Comment:             Prediabetes: 5.7 - 6.4          Diabetes: >6.4          Glycemic control for adults with diabetes: <7.0          Passed - Cr in normal range and within 360 days    Creat  Date Value Ref Range Status  11/11/2015 0.74 0.50 - 1.05 mg/dL Final   Creatinine, Ser  Date Value Ref Range Status  11/04/2019 0.84 0.57 - 1.00 mg/dL Final         Passed - Valid encounter within last 6 months    Recent Outpatient Visits          2 months ago Type 2 diabetes mellitus with hyperglycemia, with long-term current use of insulin ([place])   [facility] at [facility], Lilia Argue, MD   3 months ago Vaginal yeast infection   [facility], Lilia Argue, MD   7 months ago Type 2 diabetes mellitus with hyperglycemia, with long-term current use of insulin [facility])   [facility] at [facility], Lilia Argue, MD   10 months ago Hypoglycemia associated with type 2 diabetes mellitus [facility])   [facility] at [facility], Lilia Argue, MD   10 months ago Vaginal itching   [facility] at [facility], Arlie Solomons, MD      Future Appointments            In 3 weeks Rutherford Guys, MD Primary Care at [place], [facility]

## 2020-01-06 ENCOUNTER — Ambulatory Visit: Payer: BC Managed Care – PPO | Attending: Family

## 2020-01-06 DIAGNOSIS — Z23 Encounter for immunization: Secondary | ICD-10-CM

## 2020-01-06 NOTE — Progress Notes (Signed)
   Covid-19 Vaccination Clinic  Name:  Gabrielle [name]    MRN: [id number] DOB: [birth year]  01/06/2020  Gabrielle [name] was observed post Covid-19 immunization for 15 minutes without incidence. She was provided with Vaccine Information Sheet and instruction to access the V-Safe system.   Gabrielle [name] was instructed to call 911 with any severe reactions post vaccine: Marland Kitchen Difficulty breathing  . Swelling of your face and throat  . A fast heartbeat  . A bad rash all over your body  . Dizziness and weakness    Immunizations Administered    Name Date Dose VIS Date Route   Moderna COVID-19 Vaccine 01/06/2020  4:20 PM 0.5 mL 10/15/2019 Intramuscular   Manufacturer: Moderna   Lot: [id number]   [id number]

## 2020-01-24 LAB — HM DIABETES EYE EXAM

## 2020-01-27 ENCOUNTER — Other Ambulatory Visit: Payer: Self-pay

## 2020-01-27 ENCOUNTER — Ambulatory Visit: Payer: BC Managed Care – PPO | Admitting: Family Medicine

## 2020-01-27 ENCOUNTER — Encounter: Payer: Self-pay | Admitting: Family Medicine

## 2020-01-27 VITALS — BP 142/76 | HR 94 | Temp 97.8°F | Ht <= 58 in | Wt 124.4 lb

## 2020-01-27 DIAGNOSIS — Z794 Long term (current) use of insulin: Secondary | ICD-10-CM

## 2020-01-27 DIAGNOSIS — D3A026 Benign carcinoid tumor of the rectum: Secondary | ICD-10-CM | POA: Diagnosis not present

## 2020-01-27 DIAGNOSIS — I10 Essential (primary) hypertension: Secondary | ICD-10-CM | POA: Diagnosis not present

## 2020-01-27 DIAGNOSIS — E7800 Pure hypercholesterolemia, unspecified: Secondary | ICD-10-CM

## 2020-01-27 DIAGNOSIS — E1165 Type 2 diabetes mellitus with hyperglycemia: Secondary | ICD-10-CM | POA: Diagnosis not present

## 2020-01-27 MED ORDER — LISINOPRIL 10 MG PO TABS: ORAL_TABLET | ORAL | 1 refills | Status: DC

## 2020-01-27 MED ORDER — SITAGLIPTIN PHOSPHATE 100 MG PO TABS: 100.0000 mg | ORAL_TABLET | Freq: Every day | ORAL | 1 refills | Status: DC

## 2020-01-27 MED ORDER — ATORVASTATIN CALCIUM 40 MG PO TABS: 40.0000 mg | ORAL_TABLET | Freq: Every morning | ORAL | 3 refills | Status: DC

## 2020-01-27 MED ORDER — AMLODIPINE BESYLATE 10 MG PO TABS: 10.0000 mg | ORAL_TABLET | Freq: Every day | ORAL | 3 refills | Status: DC

## 2020-01-27 MED ORDER — DAPAGLIFLOZIN PROPANEDIOL 10 MG PO TABS: 10.0000 mg | ORAL_TABLET | Freq: Every day | ORAL | 1 refills | Status: DC

## 2020-01-27 MED ORDER — METFORMIN HCL 1000 MG PO TABS: ORAL_TABLET | ORAL | 1 refills | Status: DC

## 2020-01-27 NOTE — Patient Instructions (Signed)
° ° ° °  If you have lab work done today you will be contacted with your lab results within the next 2 weeks.  If you have not heard from us then please contact us. The fastest way to get your results is to register for My Chart. ° ° °IF you received an x-ray today, you will receive an invoice from [facility]. Please contact [facility] at [phone] with questions or concerns regarding your invoice.  ° °IF you received labwork today, you will receive an invoice from [facility]. Please contact [facility] at [phone] with questions or concerns regarding your invoice.  ° °Our billing staff will not be able to assist you with questions regarding bills from these companies. ° °You will be contacted with the lab results as soon as they are available. The fastest way to get your results is to activate your My Chart account. Instructions are located on the last page of this paperwork. If you have not heard from us regarding the results in 2 weeks, please contact this office. °  ° ° ° °

## 2020-01-27 NOTE — Progress Notes (Signed)
3/15/20214:42 PM  Gabrielle [name] [birth year], 64 y.o., female [id number]  Chief Complaint  Patient presents with  . Diabetes    77mf/u     HPI:   Patient is a 64y.o. 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 for followup   Last OV Dec 2020 - increased atorvastatin to 467m added januvia Tolerating new meds well Has not taken BP meds today Checks cbgs at home, denies any lows  Had eye exam yesterday, Dr DeParke Simmerswanting to change eye doctors as he keeps wanting to see her every 3 months and she cant afford Had colonoscopy in Jan 2021 - had another rectal carcinoid - recommended repeat flex sig and rectal EUS in 3 months and check lab test - she did not have that done  Lab Results  Component Value Date   HGBA1C 7.0 (H) 11/04/2019   HGBA1C 6.3 (H) 05/10/2019   HGBA1C 7.8 (A) 02/01/2019   Lab Results  Component Value Date   MICROALBUR 1.2 11/05/2014   LDNibley26 (H) 11/04/2019   CREATININE 0.84 11/04/2019    Depression screen PHQ 2/9 01/27/2020 11/04/2019 09/16/2019  Decreased Interest 0 0 0  Down, Depressed, Hopeless 0 0 0  PHQ - 2 Score 0 0 0    Fall Risk  01/27/2020 11/04/2019 09/16/2019 05/10/2019 03/07/2019  Falls in the past year? 0 0 0 0 0  Number falls in past yr: 0 0 0 0 0  Injury with Fall? 0 0 0 0 0  Follow up Falls evaluation completed - - - -     No Known Allergies  Prior to Admission medications   Medication Sig Start Date End Date Taking? Authorizing Provider  amLODipine (NORVASC) 10 MG tablet Take 1 tablet by mouth once daily 11/19/19  Yes SaRutherford GuysMD  aspirin 81 MG tablet Take 81 mg by mouth daily.   Yes [provider]  atorvastatin (LIPITOR) 40 MG tablet Take 1 tablet (40 mg total) by mouth every morning. 11/05/19  Yes SaRutherford GuysMD  dapagliflozin propanediol (FARXIGA) 10 MG TABS tablet Take 10 mg by mouth daily. 02/01/19  Yes SaRutherford GuysMD  glucose blood  (ACCU-CHEK SMARTVIEW) test strip PATIENT NEEDS OFFICE VISIT FOR ADDITIONAL REFILLS - very overdue for DM check up. 11/11/15  Yes English, StDorian HecklePA  JANUVIA 100 MG tablet Take 1 tablet by mouth once daily 01/04/20  Yes SaRutherford GuysMD  lisinopril (ZESTRIL) 10 MG tablet TAKE 1 TABLET BY MOUTH ONCE DAILY IN THE MORNING 11/19/19  Yes SaRutherford GuysMD  metFORMIN (GLUCOPHAGE) 1000 MG tablet TAKE 1 TABLET BY MOUTH TWICE DAILY WITH A MEAL 12/26/19  Yes SaRutherford GuysMD    Past Medical History:  Diagnosis Date  . Cancer ([place]   rectal CA  . Cataract   . Eczema   . Hyperlipidemia   . Hypertension   . Intrinsic (urethral) sphincter deficiency (ISD)   . RBBB   . SUI (stress urinary incontinence, female)    hx of  . Type 2 diabetes mellitus ([place]  . Wears dentures    UPPER  . Wears glasses     Past Surgical History:  Procedure Laterality Date  . ABDOMINAL HYSTERECTOMY  1998   fibroids, partial  . ABDOMINOPLASTY  1999  . BLADDER SUSPENSION N/A 03/06/2014   [facility] ;  Surgeon: JoIrine SealMD;  [facility]  Service: Urology;  Laterality: N/A;  . CARDIOVASCULAR STRESS TEST  03-27-2008   NORMAL PERFUSION STUDY/  EF 54%  . COLON SURGERY  2010   for rectal ca  . COLONOSCOPY    . EUS N/A 03/02/2017   Procedure: LOWER ENDOSCOPIC ULTRASOUND (EUS);  Surgeon: Milus Banister, MD;  [facility];  Service: Endoscopy;  Laterality: N/A;  . EXCISION LOWER ABDOMINAL SCAR POST ABDOMINOPLASTY  01-05-2000  . ORIF RIGHT ANKLE FX  12-24-2008  . RIGHT ANKLE ARTHROSCOPY W/ DEBRIDEMENT AND OPEN REMOVAL HARDWARE  03-12-2010  . TRANSTHORACIC ECHOCARDIOGRAM  08-05-2009   MILD LVH/  EF 60-65%  . TUBAL LIGATION      Social History   Tobacco Use  . Smoking status: Former Smoker    Years: 20.00    Types: Cigarettes    Quit date: 11/14/2006    Years since quitting: 13.2  . Smokeless tobacco: Never Used  Substance Use Topics  . Alcohol use:  No    Family History  Problem Relation Age of Onset  . Diabetes Mother   . Hypertension Mother   . Diabetes Sister   . Hypertension Sister   . Diabetes Brother   . Hypertension Brother   . Lupus Daughter   . Colon cancer Neg Hx   . Esophageal cancer Neg Hx   . Rectal cancer Neg Hx   . Stomach cancer Neg Hx   . Colon polyps Neg Hx     Review of Systems  Constitutional: Negative for chills and fever.  Respiratory: Negative for cough and shortness of breath.   Cardiovascular: Negative for chest pain, palpitations and leg swelling.  Gastrointestinal: Negative for abdominal pain, nausea and vomiting.   Per hpi  OBJECTIVE:  Today's Vitals   01/27/20 1633 01/27/20 1636  BP: (!) 157/75 (!) 142/70  Pulse: 94   Temp: 97.8 F (36.6 C)   TempSrc: Temporal   SpO2: 96%   Weight: 124 lb 6.4 oz (56.4 kg)   Height: _0  (1.422 m)    Body mass index is 27.89 kg/m.   BP Readings from Last 3 Encounters:  01/27/20 (!) 142/70  12/03/19 133/67  11/04/19 127/82   Wt Readings from Last 3 Encounters:  01/27/20 124 lb 6.4 oz (56.4 kg)  12/03/19 126 lb (57.2 kg)  11/27/19 126 lb (57.2 kg)    Physical Exam Vitals and nursing note reviewed.  Constitutional:      Appearance: She is well-developed.  HENT:     Head: Normocephalic and atraumatic.     Mouth/Throat:     Pharynx: No oropharyngeal exudate.  Eyes:     General: No scleral icterus.    Conjunctiva/sclera: Conjunctivae normal.     Pupils: Pupils are equal, round, and reactive to light.  Cardiovascular:     Rate and Rhythm: Normal rate and regular rhythm.     Heart sounds: Normal heart sounds. No murmur. No friction rub. No gallop.   Pulmonary:     Effort: Pulmonary effort is normal.     Breath sounds: Normal breath sounds. No wheezing or rales.  Musculoskeletal:     Cervical back: Neck supple.     Right lower leg: No edema.     Left lower leg: No edema.  Skin:    General: Skin is warm and dry.  Neurological:      Mental Status: She is alert and oriented to person, place, and time.     No results found for this or any previous visit (  from the past 24 hour(s)).  No results found.   ASSESSMENT and PLAN  1. Essential hypertension Slightly above goal in setting of not taking meds today, previous controlled. Cont current regime - CMP14+EGFR - amLODipine (NORVASC) 10 MG tablet; Take 1 tablet (10 mg total) by mouth daily. - lisinopril (ZESTRIL) 10 MG tablet; TAKE 1 TABLET BY MOUTH ONCE DAILY IN THE MORNING  2. Pure hypercholesterolemia Checking labs today, medications will be adjusted as needed.  - Lipid panel - atorvastatin (LIPITOR) 40 MG tablet; Take 1 tablet (40 mg total) by mouth every morning.  3. Type 2 diabetes mellitus with hyperglycemia, with long-term current use of insulin ([place]) Checking labs today, medications will be adjusted as needed.  - Hemoglobin A1c - metFORMIN (GLUCOPHAGE) 1000 MG tablet; TAKE 1 TABLET BY MOUTH TWICE DAILY WITH A MEAL  4. Carcinoid tumor of rectum, unspecified whether malignant Managed by GI - Chromogranin A - lab ordered by GI, collected today as has not had done yet  5. Hypercalcemia - Calcium, ionized  Other orders - dapagliflozin propanediol (FARXIGA) 10 MG TABS tablet; Take 10 mg by mouth daily. - sitaGLIPtin (JANUVIA) 100 MG tablet; Take 1 tablet (100 mg total) by mouth daily.  Return in about 6 months (around 07/29/2020).    Rutherford Guys, MD Primary Care at [place], [place] [address] Ph.  [phone] Fax [phone]

## 2020-01-28 ENCOUNTER — Encounter: Payer: Self-pay | Admitting: Family Medicine

## 2020-01-29 LAB — LIPID PANEL
Chol/HDL Ratio: 4.6 ratio — ABNORMAL HIGH (ref 0.0–4.4)
Cholesterol, Total: 174 mg/dL (ref 100–199)
HDL: 38 mg/dL — ABNORMAL LOW (ref >39)
LDL Chol Calc (NIH): 109 mg/dL — ABNORMAL HIGH (ref 0–99)
Triglycerides: 152 mg/dL — ABNORMAL HIGH (ref 0–149)
VLDL Cholesterol Cal: 27 mg/dL (ref 5–40)

## 2020-01-29 LAB — CMP14+EGFR
ALT: 9 IU/L (ref 0–32)
AST: 16 IU/L (ref 0–40)
Albumin/Globulin Ratio: 1.3 (ref 1.2–2.2)
Albumin: 4.8 g/dL (ref 3.8–4.8)
Alkaline Phosphatase: 104 IU/L (ref 39–117)
BUN/Creatinine Ratio: 13 (ref 12–28)
BUN: 10 mg/dL (ref 8–27)
Bilirubin Total: 0.3 mg/dL (ref 0.0–1.2)
CO2: 22 mmol/L (ref 20–29)
Calcium: 10.8 mg/dL — ABNORMAL HIGH (ref 8.7–10.3)
Chloride: 96 mmol/L (ref 96–106)
Creatinine, Ser: 0.75 mg/dL (ref 0.57–1.00)
GFR calc Af Amer: 98 mL/min/{1.73_m2} (ref >59)
GFR calc non Af Amer: 85 mL/min/{1.73_m2} (ref >59)
Globulin, Total: 3.8 g/dL (ref 1.5–4.5)
Glucose: 88 mg/dL (ref 65–99)
Potassium: 4.7 mmol/L (ref 3.5–5.2)
Sodium: 136 mmol/L (ref 134–144)
Total Protein: 8.6 g/dL — ABNORMAL HIGH (ref 6.0–8.5)

## 2020-01-29 LAB — CHROMOGRANIN A: Chromogranin A (ng/mL): 84.8 ng/mL (ref 0.0–101.8)

## 2020-01-29 LAB — CALCIUM, IONIZED: Calcium, Ion: 5.2 mg/dL (ref 4.5–5.6)

## 2020-01-29 LAB — HEMOGLOBIN A1C
Est. average glucose Bld gHb Est-mCnc: 137 mg/dL
Hgb A1c MFr Bld: 6.4 % — ABNORMAL HIGH (ref 4.8–5.6)

## 2020-01-31 ENCOUNTER — Encounter: Payer: Self-pay | Admitting: Family Medicine

## 2020-02-18 ENCOUNTER — Ambulatory Visit: Payer: BC Managed Care – PPO | Attending: Family

## 2020-02-18 DIAGNOSIS — Z23 Encounter for immunization: Secondary | ICD-10-CM

## 2020-02-18 NOTE — Progress Notes (Signed)
   Covid-19 Vaccination Clinic  Name:  Gabrielle [name]    MRN: [id number] DOB: [birth year]  02/18/2020  Gabrielle [name] was observed post Covid-19 immunization for 15 minutes without incident. She was provided with Vaccine Information Sheet and instruction to access the V-Safe system.   Gabrielle [name] was instructed to call 911 with any severe reactions post vaccine: Marland Kitchen Difficulty breathing  . Swelling of face and throat  . A fast heartbeat  . A bad rash all over body  . Dizziness and weakness   Immunizations Administered    Name Date Dose VIS Date Route   Moderna COVID-19 Vaccine 02/18/2020  4:10 PM 0.5 mL 10/15/2019 Intramuscular   Manufacturer: Moderna   Lot: [id number]   [id number]

## 2020-07-31 ENCOUNTER — Encounter: Payer: Self-pay | Admitting: Family Medicine

## 2020-07-31 ENCOUNTER — Other Ambulatory Visit: Payer: Self-pay

## 2020-07-31 ENCOUNTER — Ambulatory Visit: Payer: BC Managed Care – PPO | Admitting: Family Medicine

## 2020-07-31 VITALS — BP 124/75 | HR 84 | Temp 97.4°F | Ht <= 58 in | Wt 121.4 lb

## 2020-07-31 DIAGNOSIS — E7800 Pure hypercholesterolemia, unspecified: Secondary | ICD-10-CM | POA: Diagnosis not present

## 2020-07-31 DIAGNOSIS — I10 Essential (primary) hypertension: Secondary | ICD-10-CM | POA: Diagnosis not present

## 2020-07-31 DIAGNOSIS — E1165 Type 2 diabetes mellitus with hyperglycemia: Secondary | ICD-10-CM

## 2020-07-31 LAB — POCT GLYCOSYLATED HEMOGLOBIN (HGB A1C): Hemoglobin A1C: 9.7 % — AB (ref 4.0–5.6)

## 2020-07-31 MED ORDER — METFORMIN HCL 1000 MG PO TABS: ORAL_TABLET | ORAL | 1 refills | Status: DC

## 2020-07-31 MED ORDER — SITAGLIPTIN PHOSPHATE 100 MG PO TABS: 100.0000 mg | ORAL_TABLET | Freq: Every day | ORAL | 1 refills | Status: DC

## 2020-07-31 MED ORDER — LISINOPRIL 10 MG PO TABS: ORAL_TABLET | ORAL | 1 refills | Status: DC

## 2020-07-31 MED ORDER — DAPAGLIFLOZIN PROPANEDIOL 10 MG PO TABS: 10.0000 mg | ORAL_TABLET | Freq: Every day | ORAL | 1 refills | Status: DC

## 2020-07-31 MED ORDER — RYBELSUS 7 MG PO TABS: 7.0000 mg | ORAL_TABLET | Freq: Every day | ORAL | 2 refills | Status: DC

## 2020-07-31 MED ORDER — RYBELSUS 3 MG PO TABS: 3.0000 mg | ORAL_TABLET | Freq: Every day | ORAL | 0 refills | Status: DC

## 2020-07-31 MED ORDER — ATORVASTATIN CALCIUM 40 MG PO TABS: 40.0000 mg | ORAL_TABLET | Freq: Every morning | ORAL | 3 refills | Status: DC

## 2020-07-31 MED ORDER — AMLODIPINE BESYLATE 10 MG PO TABS: 10.0000 mg | ORAL_TABLET | Freq: Every day | ORAL | 3 refills | Status: DC

## 2020-07-31 NOTE — Progress Notes (Signed)
9/17/20214:43 PM  Gabrielle [name] [birth year], 64 y.o., female [id number]  Chief Complaint  Patient presents with  . Diabetes  . Hypertension    HPI:   Patient is a 64 y.o. female with past medical history significant for  HTN, DM2,carcinoidrectal cancerwho presents today for followup   Last OV march 2021 - no changes  She is overall doing well and has no acute concerns Her husband is concerned about her weight loss Has not ben checking cbgs She is taking metformin, [place] and farxiga for DM2 She denies any sx of hypoglycemia She denies any polyuria or polydipsia   She takes amlodipine and lisinopril for HTN She takes atorvastatin for HLP She continues to work on diet Declines flu vaccine  Sees GI in nov 2021 for follow up carcinoid tumor  Wt Readings from Last 3 Encounters:  07/31/20 121 lb 6.4 oz (55.1 kg)  01/27/20 124 lb 6.4 oz (56.4 kg)  12/03/19 126 lb (57.2 kg)   BP Readings from Last 3 Encounters:  07/31/20 124/75  01/27/20 (!) 142/76  12/03/19 133/67    Lab Results  Component Value Date   HGBA1C 6.4 (H) 01/27/2020   HGBA1C 7.0 (H) 11/04/2019   HGBA1C 6.3 (H) 05/10/2019   Lab Results  Component Value Date   MICROALBUR 1.2 11/05/2014   LDLCALC 109 (H) 01/27/2020   CREATININE 0.75 01/27/2020    Depression screen PHQ 2/9 07/31/2020 01/27/2020 11/04/2019  Decreased Interest 0 0 0  Down, Depressed, Hopeless 0 0 0  PHQ - 2 Score 0 0 0    Fall Risk  01/27/2020 11/04/2019 09/16/2019 05/10/2019 03/07/2019  Falls in the past year? 0 0 0 0 0  Number falls in past yr: 0 0 0 0 0  Injury with Fall? 0 0 0 0 0  Follow up Falls evaluation completed - - - -     No Known Allergies  Prior to Admission medications   Medication Sig Start Date End Date Taking? Authorizing Provider  amLODipine (NORVASC) 10 MG tablet Take 1 tablet (10 mg total) by mouth daily. 01/27/20   Rutherford Guys, MD  aspirin 81 MG tablet Take 81 mg by mouth daily.    [provider]  atorvastatin (LIPITOR) 40 MG tablet Take 1 tablet (40 mg total) by mouth every morning. 01/27/20   Rutherford Guys, MD  dapagliflozin propanediol (FARXIGA) 10 MG TABS tablet Take 10 mg by mouth daily. 01/27/20   Rutherford Guys, MD  glucose blood (ACCU-CHEK SMARTVIEW) test strip PATIENT NEEDS OFFICE VISIT FOR ADDITIONAL REFILLS - very overdue for DM check up. 11/11/15   Ivar Drape D, PA  lisinopril (ZESTRIL) 10 MG tablet TAKE 1 TABLET BY MOUTH ONCE DAILY IN THE MORNING 01/27/20   Rutherford Guys, MD  metFORMIN (GLUCOPHAGE) 1000 MG tablet TAKE 1 TABLET BY MOUTH TWICE DAILY WITH A MEAL 01/27/20   Rutherford Guys, MD  sitaGLIPtin (JANUVIA) 100 MG tablet Take 1 tablet (100 mg total) by mouth daily. 01/27/20   Rutherford Guys, MD    Past Medical History:  Diagnosis Date  . Cancer ([place]) 2010   rectal CA  . Cataract   . Eczema   . Hyperlipidemia   . Hypertension   . Intrinsic (urethral) sphincter deficiency (ISD)   . RBBB   . SUI (stress urinary incontinence, female)    hx of  . Type 2 diabetes mellitus ([place])   . Wears dentures    UPPER  . Wears  glasses     Past Surgical History:  Procedure Laterality Date  . ABDOMINAL HYSTERECTOMY  1998   fibroids, partial  . ABDOMINOPLASTY  1999  . BLADDER SUSPENSION N/A 03/06/2014   Procedure: [facility] SL[facility] ;  Surgeon: Irine Seal, MD;  Location: [facility];  Service: Urology;  Laterality: N/A;  . CARDIOVASCULAR STRESS TEST  03-27-2008   NORMAL PERFUSION STUDY/  EF 54%  . COLON SURGERY  2010   for rectal ca  . COLONOSCOPY    . EUS N/A 03/02/2017   Procedure: LOWER ENDOSCOPIC ULTRASOUND (EUS);  Surgeon: Milus Banister, MD;  Location: [facility];  Service: Endoscopy;  Laterality: N/A;  . EXCISION LOWER ABDOMINAL SCAR POST ABDOMINOPLASTY  01-05-2000  . ORIF RIGHT ANKLE FX  12-24-2008  . RIGHT ANKLE ARTHROSCOPY W/ DEBRIDEMENT AND OPEN REMOVAL HARDWARE  03-12-2010  . TRANSTHORACIC ECHOCARDIOGRAM  08-05-2009    MILD LVH/  EF 60-65%  . TUBAL LIGATION      Social History   Tobacco Use  . Smoking status: Former Smoker    Years: 20.00    Types: Cigarettes    Quit date: 11/14/2006    Years since quitting: 13.7  . Smokeless tobacco: Never Used  Substance Use Topics  . Alcohol use: No    Family History  Problem Relation Age of Onset  . Diabetes Mother   . Hypertension Mother   . Diabetes Sister   . Hypertension Sister   . Diabetes Brother   . Hypertension Brother   . Lupus Daughter   . Colon cancer Neg Hx   . Esophageal cancer Neg Hx   . Rectal cancer Neg Hx   . Stomach cancer Neg Hx   . Colon polyps Neg Hx     Review of Systems  Constitutional: Negative for chills and fever.  Respiratory: Negative for cough and shortness of breath.   Cardiovascular: Negative for chest pain, palpitations and leg swelling.  Gastrointestinal: Negative for abdominal pain, nausea and vomiting.   Per hpi  OBJECTIVE:  Today's Vitals   07/31/20 1628  BP: 124/75  Pulse: 84  Temp: (!) 97.4 F (36.3 C)  SpO2: 99%  Weight: 121 lb 6.4 oz (55.1 kg)  Height: _0  (1.422 m)   Body mass index is 27.22 kg/m.   Physical Exam Vitals and nursing note reviewed.  Constitutional:      Appearance: She is well-developed.  HENT:     Head: Normocephalic and atraumatic.     Mouth/Throat:     Pharynx: No oropharyngeal exudate.  Eyes:     General: No scleral icterus.    Extraocular Movements: Extraocular movements intact.     Conjunctiva/sclera: Conjunctivae normal.     Pupils: Pupils are equal, round, and reactive to light.  Cardiovascular:     Rate and Rhythm: Normal rate and regular rhythm.     Heart sounds: Normal heart sounds. No murmur heard.  No friction rub. No gallop.   Pulmonary:     Effort: Pulmonary effort is normal.     Breath sounds: Normal breath sounds. No wheezing, rhonchi or rales.  Musculoskeletal:     Cervical back: Neck supple.  Skin:    General: Skin is warm and dry.    Neurological:     Mental Status: She is alert and oriented to person, place, and time.     Results for orders placed or performed in visit on 07/31/20 (from the past 24 hour(s))  POCT A1C     Status:  Abnormal   Collection Time: 07/31/20  5:06 PM  Result Value Ref Range   Hemoglobin A1C 9.7 (A) 4.0 - 5.6 %   HbA1c POC (<> result, manual entry)     HbA1c, POC (prediabetic range)     HbA1c, POC (controlled diabetic range)      No results found.   ASSESSMENT and PLAN  1. Type 2 diabetes mellitus with hyperglycemia, without long-term current use of insulin (HCC) Not at goal. Discussed cbg monitoring. D/c januvia. Start rybelsus. Reviewed r/se/b/titration. Reviewed LFM - metFORMIN (GLUCOPHAGE) 1000 MG tablet; TAKE 1 TABLET BY MOUTH TWICE DAILY WITH A MEAL - CMP14+EGFR - Lipid panel - TSH - POCT A1C  2. Essential hypertension Controlled. Continue current regime.  - amLODipine (NORVASC) 10 MG tablet; Take 1 tablet (10 mg total) by mouth daily. - dapagliflozin propanediol (FARXIGA) 10 MG TABS tablet; Take 1 tablet (10 mg total) by mouth daily. - lisinopril (ZESTRIL) 10 MG tablet; TAKE 1 TABLET BY MOUTH ONCE DAILY IN THE MORNING - CMP14+EGFR - Lipid panel - TSH  3. Pure hypercholesterolemia Checking labs today, medications will be adjusted as needed.  - atorvastatin (LIPITOR) 40 MG tablet; Take 1 tablet (40 mg total) by mouth every morning. - CMP14+EGFR - Lipid panel - TSH  Other orders - Semaglutide (RYBELSUS) 3 MG TABS; Take 3 mg by mouth daily. - Semaglutide (RYBELSUS) 7 MG TABS; Take 7 mg by mouth daily.  Return in about 3 months (around 10/30/2020) for DR Carlota Raspberry for DM/HTN/HLP and then late dec for CPE.    Rutherford Guys, MD [facility] [place], [place] [address] Ph.  [phone] Fax [phone]

## 2020-08-01 LAB — CMP14+EGFR
ALT: 11 IU/L (ref 0–32)
AST: 13 IU/L (ref 0–40)
Albumin/Globulin Ratio: 1.1 — ABNORMAL LOW (ref 1.2–2.2)
Albumin: 4.3 g/dL (ref 3.8–4.8)
Alkaline Phosphatase: 128 IU/L — ABNORMAL HIGH (ref 44–121)
BUN/Creatinine Ratio: 11 — ABNORMAL LOW (ref 12–28)
BUN: 12 mg/dL (ref 8–27)
Bilirubin Total: 0.2 mg/dL (ref 0.0–1.2)
CO2: 21 mmol/L (ref 20–29)
Calcium: 10.5 mg/dL — ABNORMAL HIGH (ref 8.7–10.3)
Chloride: 96 mmol/L (ref 96–106)
Creatinine, Ser: 1.05 mg/dL — ABNORMAL HIGH (ref 0.57–1.00)
GFR calc Af Amer: 65 mL/min/{1.73_m2} (ref >59)
GFR calc non Af Amer: 57 mL/min/{1.73_m2} — ABNORMAL LOW (ref >59)
Globulin, Total: 4 g/dL (ref 1.5–4.5)
Glucose: 240 mg/dL — ABNORMAL HIGH (ref 65–99)
Potassium: 4.9 mmol/L (ref 3.5–5.2)
Sodium: 135 mmol/L (ref 134–144)
Total Protein: 8.3 g/dL (ref 6.0–8.5)

## 2020-08-01 LAB — TSH: TSH: 0.803 u[IU]/mL (ref 0.450–4.500)

## 2020-08-01 LAB — LIPID PANEL
Chol/HDL Ratio: 5.4 ratio — ABNORMAL HIGH (ref 0.0–4.4)
Cholesterol, Total: 204 mg/dL — ABNORMAL HIGH (ref 100–199)
HDL: 38 mg/dL — ABNORMAL LOW (ref >39)
LDL Chol Calc (NIH): 124 mg/dL — ABNORMAL HIGH (ref 0–99)
Triglycerides: 239 mg/dL — ABNORMAL HIGH (ref 0–149)
VLDL Cholesterol Cal: 42 mg/dL — ABNORMAL HIGH (ref 5–40)

## 2020-09-10 ENCOUNTER — Ambulatory Visit: Payer: BC Managed Care – PPO | Attending: Internal Medicine

## 2020-09-10 DIAGNOSIS — Z23 Encounter for immunization: Secondary | ICD-10-CM

## 2020-09-10 NOTE — Progress Notes (Signed)
   Covid-19 Vaccination Clinic  Name:  DEARRA MYHAND    MRN: [id number] DOB: Oct 09, 1956  09/10/2020  Ms. Bednarz was observed post Covid-19 immunization for 15 minutes without incident. She was provided with Vaccine Information Sheet and instruction to access the V-Safe system.   Ms. Howells was instructed to call 911 with any severe reactions post vaccine: Marland Kitchen Difficulty breathing  . Swelling of face and throat  . A fast heartbeat  . A bad rash all over body  . Dizziness and weakness

## 2020-09-25 ENCOUNTER — Ambulatory Visit: Payer: BC Managed Care – PPO | Admitting: Gastroenterology

## 2020-09-28 ENCOUNTER — Telehealth: Payer: Self-pay

## 2020-09-28 NOTE — Telephone Encounter (Signed)
Thanks Patty. Is she willing to follow up with [place] for an office visit or direct procedure?

## 2020-09-28 NOTE — Telephone Encounter (Signed)
Got it. I will take off my Work que for now. Please let Patty and I know if we can be of assistance in future. GM

## 2020-09-28 NOTE — Telephone Encounter (Signed)
Okay thanks for the follow up. She can see me in the office if she changes her mind and wishes to have this reassessed Gabrielle [name]

## 2020-09-28 NOTE — Telephone Encounter (Signed)
Per Rovonda the pt does not wish to schedule any appt.

## 2020-09-28 NOTE — Telephone Encounter (Signed)
-----   Message from Yetta Flock, MD sent at 09/27/2020  3:37 PM EST ----- Regarding: RE: Follow-up Nice catch Gabe thanks, she is quite overdue for this. Thanks Eveleigh Crumpler  ----- Message ----- From: Irving Copas., MD Sent: 09/27/2020   1:15 PM EST To: Timothy Lasso, RN, Yetta Flock, MD Subject: Follow-up                                      Meenakshi Sazama,This was a patient who was to have an EUS earlier this year for follow-up of a recurrent rectal carcinoid and potential EMR.I do not know if the patient did not call us back to get scheduled but can you please follow-up on this and get her scheduled for a lower EUS with DJ or myself.Please update [place] when you find out more information.Thanks.GM

## 2020-09-28 NOTE — Telephone Encounter (Signed)
This pt had an office visit for 11/12 and cancelled.

## 2020-10-23 ENCOUNTER — Other Ambulatory Visit: Payer: Self-pay

## 2020-10-23 ENCOUNTER — Encounter: Payer: Self-pay | Admitting: Family Medicine

## 2020-10-23 ENCOUNTER — Ambulatory Visit: Payer: BC Managed Care – PPO | Admitting: Family Medicine

## 2020-10-23 VITALS — BP 128/70 | HR 86 | Temp 98.2°F | Ht <= 58 in | Wt 119.0 lb

## 2020-10-23 DIAGNOSIS — I10 Essential (primary) hypertension: Secondary | ICD-10-CM | POA: Diagnosis not present

## 2020-10-23 DIAGNOSIS — R5383 Other fatigue: Secondary | ICD-10-CM

## 2020-10-23 DIAGNOSIS — E7800 Pure hypercholesterolemia, unspecified: Secondary | ICD-10-CM

## 2020-10-23 DIAGNOSIS — E1165 Type 2 diabetes mellitus with hyperglycemia: Secondary | ICD-10-CM

## 2020-10-23 LAB — POCT GLYCOSYLATED HEMOGLOBIN (HGB A1C): Hemoglobin A1C: 9.9 % — AB (ref 4.0–5.6)

## 2020-10-23 LAB — GLUCOSE, POCT (MANUAL RESULT ENTRY): POC Glucose: 124 mg/dl — AB (ref 70–99)

## 2020-10-23 MED ORDER — METFORMIN HCL 1000 MG PO TABS: ORAL_TABLET | ORAL | 1 refills | Status: DC

## 2020-10-23 MED ORDER — LISINOPRIL 10 MG PO TABS: ORAL_TABLET | ORAL | 1 refills | Status: DC

## 2020-10-23 MED ORDER — RYBELSUS 7 MG PO TABS: 14.0000 mg | ORAL_TABLET | Freq: Every day | ORAL | 2 refills | Status: DC

## 2020-10-23 MED ORDER — ATORVASTATIN CALCIUM 40 MG PO TABS
40.0000 mg | ORAL_TABLET | Freq: Every morning | ORAL | 3 refills | Status: DC
Start: 2020-10-23 — End: 2021-03-17

## 2020-10-23 MED ORDER — DAPAGLIFLOZIN PROPANEDIOL 10 MG PO TABS: 10.0000 mg | ORAL_TABLET | Freq: Every day | ORAL | 1 refills | Status: DC

## 2020-10-23 MED ORDER — AMLODIPINE BESYLATE 10 MG PO TABS: 10.0000 mg | ORAL_TABLET | Freq: Every day | ORAL | 3 refills | Status: DC

## 2020-10-23 NOTE — Patient Instructions (Addendum)
Continue Metformin, Farxiga same doses for now.  Can try higher dose of Rybelsus (38m - 2 pills) but may need to adjust medicines further at follow-up visit in January.  If any worsening nausea or other new side effects at higher dose, return to 7 mg and let me know.  Try to check your blood sugar either fasting, or 2 hours after meals and check blood sugar once every 2 days at the minimum.  Bring those readings with you to next visit.  I will check some other blood tests for fatigue, but if any worsening fatigue or headaches be seen sooner than January visit.  Return to the clinic or go to the nearest emergency room if any of your symptoms worsen or new symptoms occur.    Type 2 Diabetes Mellitus, Self Care, Adult Caring for yourself after you have been diagnosed with type 2 diabetes (type 2 diabetes mellitus) means keeping your blood sugar (glucose) under control with a balance of:  Nutrition.  Exercise.  Lifestyle changes.  Medicines or insulin, if necessary.  Support from your team of health care providers and others. The following information explains what you need to know to manage your diabetes at home. What are the risks? Having diabetes can put you at risk for other long-term (chronic) conditions, such as heart disease and kidney disease. Your health care provider may prescribe medicines to help prevent complications from diabetes. These medicines may include:  Aspirin.  Medicine to lower cholesterol.  Medicine to control blood pressure. How to monitor blood glucose   Check your blood glucose every day, as often as told by your health care provider.  Have your A1c (hemoglobin A1c) level checked two or more times a year, or as often as told by your health care provider. Your health care provider will set individualized treatment goals for you. Generally, the goal of treatment is to maintain the following blood glucose levels:  Before meals (preprandial): 80-130 mg/dL  (4.4-7.2 mmol/L).  After meals (postprandial): below 180 mg/dL (10 mmol/L).  A1c level: less than 7%. How to manage hyperglycemia and hypoglycemia Hyperglycemia symptoms Hyperglycemia, also called high blood glucose, occurs when blood glucose is too high. Make sure you know the early signs of hyperglycemia, such as:  Increased thirst.  Hunger.  Feeling very tired.  Needing to urinate more often than usual.  Blurry vision. Hypoglycemia symptoms Hypoglycemia, also called low blood glucose, occurswith a blood glucose level at or below 70 mg/dL (3.9 mmol/L). The risk for hypoglycemia increases during or after exercise, during sleep, during illness, and when skipping meals or not eating for a long time (fasting). It is important to know the symptoms of hypoglycemia and treat it right away. Always have a 15-gram rapid-acting carbohydrate snack with you to treat low blood glucose. Family members and close friends should also know the symptoms and should understand how to treat hypoglycemia, in case you are not able to treat yourself. Symptoms may include:  Hunger.  Anxiety.  Sweating and feeling clammy.  Confusion.  Dizziness or feeling light-headed.  Sleepiness.  Nausea.  Increased heart rate.  Headache.  Blurry vision.  Irritability.  A change in coordination.  Tingling or numbness around the mouth, lips, or tongue.  Restless sleep.  Fainting.  Seizure. Treating hypoglycemia If you are alert and able to swallow safely, follow the 15:15 rule:  Take 15 grams of a rapid-acting carbohydrate. Talk with your health care provider about how much you should take.  Rapid-acting options include: ?  Glucose pills (take 15 grams). ? 6-8 pieces of hard candy. ? 4-6 oz (120-150 mL) of fruit juice. ? 4-6 oz (120-150 mL) of regular (not diet) soda. ? 1 Tbsp (15 mL) honey or sugar.  Check your blood glucose 15 minutes after you take the carbohydrate.  If the repeat blood  glucose level is still at or below 70 mg/dL (3.9 mmol/L), take 15 grams of a carbohydrate again.  If your blood glucose level does not increase above 70 mg/dL (3.9 mmol/L) after 3 tries, seek emergency medical care.  After your blood glucose level returns to normal, eat a meal or a snack within 1 hour. Treating severe hypoglycemia Severe hypoglycemia is when your blood glucose level is at or below 54 mg/dL (3 mmol/L). Severe hypoglycemia is an emergency. Do not wait to see if the symptoms will go away. Get medical help right away. Call your local emergency services (911 in the [place] If you have severe hypoglycemia and you cannot eat or drink, you may need an injection of glucagon. A family member or close friend should learn how to check your blood glucose and how to give you a glucagon injection. Ask your health care provider if you need to have an emergency glucagon injection kit available. Severe hypoglycemia may need to be treated in a hospital. The treatment may include getting glucose through an IV. You may also need treatment for the cause of your hypoglycemia. Follow these instructions at home: Take diabetes medicines as told  If your health care provider prescribed insulin or diabetes medicines, take them every day.  Do not run out of insulin or other diabetes medicines that you take. Plan ahead so you always have these available.  If you use insulin, adjust your dosage based on how physically active you are and what foods you eat. Your health care provider will tell you how to adjust your dosage. Make healthy food choices  The things that you eat and drink affect your blood glucose and your insulin dosage. Making good choices helps to control your diabetes and prevent other health problems. A healthy meal plan includes eating lean proteins, complex carbohydrates, fresh fruits and vegetables, low-fat dairy products, and healthy fats. Make an appointment to see a diet and nutrition  specialist (registered dietitian) to help you create an eating plan that is right for you. Make sure that you:  Follow instructions from your health care provider about eating or drinking restrictions.  Drink enough fluid to keep your urine pale yellow.  Keep a record of the carbohydrates that you eat. Do this by reading food labels and learning the standard serving sizes of foods.  Follow your sick day plan whenever you cannot eat or drink as usual. Make this plan in advance with your health care provider.  Stay active Exercise regularly, as told by your health care provider. This may include:  Stretching and doing strength exercises, such as yoga or weightlifting, 2 or more times a week.  Doing 150 minutes or more of moderate-intensity or vigorous-intensity exercise each week. This could be brisk walking, biking, or water aerobics. ? Spread out your activity over 3 or more days of the week. ? Do not go more than 2 days in a row without doing some kind of physical activity. When you start a new exercise or activity, work with your health care provider to adjust your insulin, medicines, or food intake as needed. Make healthy lifestyle choices  Do not use any tobacco products, such  as cigarettes, chewing tobacco, and e-cigarettes. If you need help quitting, ask your health care provider.  If your health care provider says that alcohol is safe for you, limit alcohol intake to no more than 1 drink per day for nonpregnant women and 2 drinks per day for men. One drink equals 12 oz of beer (355 mL), 5 oz of wine (148 mL), or 1 oz of hard liquor (44 mL).  Learn to manage stress. If you need help with this, ask your health care provider. Care for your body   Keep your immunizations up to date. In addition to getting vaccinations as told by your health care provider, it is recommended that you get vaccinated against the following illnesses: ? The flu (influenza). Get a flu shot every  year. ? Pneumonia. ? Hepatitis B.  Schedule an eye exam soon after your diagnosis, and then one time every year after that.  Check your skin and feet every day for cuts, bruises, redness, blisters, or sores. Schedule a foot exam with your health care provider once every year.  Brush your teeth and gums two times a day, and floss one or more times a day. Visit your dentist one or more times every 6 months.  Maintain a healthy weight. General instructions  Take over-the-counter and prescription medicines only as told by your health care provider.  Share your diabetes management plan with people in your workplace, school, and household.  Carry a medical alert card or wear medical alert jewelry.  Keep all follow-up visits as told by your health care provider. This is important. Questions to ask your health care provider  Do I need to meet with a diabetes educator?  Where can I find a support group for people with diabetes? Where to find more information For more information about diabetes, visit:  American Diabetes Association (ADA): www.diabetes.org  American Association of Diabetes Educators (AADE): www.diabeteseducator.org Summary  Caring for yourself after you have been diagnosed with (type 2 diabetes mellitus) means keeping your blood sugar (glucose) under control with a balance of nutrition, exercise, lifestyle changes, and medicine.  Check your blood glucose every day, as often as told by your health care provider.  Having diabetes can put you at risk for other long-term (chronic) conditions, such as heart disease and kidney disease. Your health care provider may prescribe medicines to help prevent complications from diabetes.  Keep all follow-up visits as told by your health care provider. This is important. This information is not intended to replace advice given to you by your health care provider. Make sure you discuss any questions you have with your health care  provider. Document Revised: 04/23/2018 Document Reviewed: 12/04/2015 Elsevier Patient Education  [employer].    If you have lab work done today you will be contacted with your lab results within the next 2 weeks.  If you have not heard from [place] then please contact us. The fastest way to get your results is to register for My Chart.   IF you received an x-ray today, you will receive an invoice from [facility]. Please contact [facility] at [phone] with questions or concerns regarding your invoice.   IF you received labwork today, you will receive an invoice from [facility]. Please contact [facility] at [phone] with questions or concerns regarding your invoice.   Our billing staff will not be able to assist you with questions regarding bills from these companies.  You will be contacted with the lab results as soon  as they are available. The fastest way to get your results is to activate your My Chart account. Instructions are located on the last page of this paperwork. If you have not heard from [place] regarding the results in 2 weeks, please contact this office.

## 2020-10-23 NOTE — Progress Notes (Signed)
 Subjective:  Patient ID: Gabrielle [name], female    DOB: 12/28/1955  Age: 64 y.o. MRN: [id number]  CC:  Chief Complaint  Patient presents with  . Follow-up    On hypertension,diabetes, and hypercholesterolemia. PT reports no issues with these medical conditions. Pt reports noticing fatigue lately. Pt reports no issues with BP or BS that she has noticed. Pt reports headaches every now and then, but no other physical symptoms of these conditions.     HPI Syriah C Swayze presents for  New patient for review of chronic medical issues as above, establish care.  Prior PCP Dr. Santiago.  Diabetes: Complicated by hyperglycemia A1c had increased from 6.4 in March to 9.7 September 17.  Was not checking her blood sugars at that time but was taking Metformin, Januvia and Farxiga.  Januvia was discontinued and started on Rybelsus.  Initially 3 mg daily, plan for increase to 7 mg daily.  Taking taking rybelsus 7mg qd. No side effects still taking metfomin and farxiga. No recent home readings - none in past 6 months.  Increased thirst - since starting farxiga. Some urinary frequency, slight nausea. No abd pain/vomiting.  Has testing strips, but costly.  Episodic headaches and fatigue - has been fatigued since onset of diabetes. Husband has prostate issue - had seed implant, not sexually active. Not feeling depressed/stressed. Not worst HA of life. Episodic HA, not daily. alleve about once per week.  Dark stools last week, no black stools. Normal this week. No blood in stool.  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    Microalbumin: Normal ratio 11/04/2019 Optho, foot exam, pneumovax: Due for foot exam.  Otherwise up-to-date. She is on ACE inhibitor as well as statin. Watching portions.   Lab Results  Component Value Date   WBC 8.3 10/23/2020   HGB 15.2 10/23/2020   HCT 46.2 10/23/2020   MCV 86 10/23/2020   PLT 319 10/23/2020    Diabetic Foot Exam - Simple   No data filed     Lab Results  Component Value Date   HGBA1C 9.9 (A) 10/23/2020   HGBA1C 9.7 (A) 07/31/2020   HGBA1C 6.4 (H) 01/27/2020   Lab Results  Component Value Date   MICROALBUR 1.2 11/05/2014   LDLCALC 116 (H) 10/23/2020   CREATININE 0.91 10/23/2020   Wt Readings from Last 3 Encounters:  10/23/20 119 lb (54 kg)  07/31/20 121 lb 6.4 oz (55.1 kg)  01/27/20 124 lb 6.4 oz (56.4 kg)      Hypertension: With history of atherosclerosis, intermittent claudication per problem list. Takes lisinopril 10 mg daily, amlodipine 10 mg daily.  Does take aspirin 81 mg daily. Minimal leg pain with walking, improves with rest. Stable - no recent changes.  Home readings: 165/70. Some 120's.  BP Readings from Last 3 Encounters:  10/23/20 128/70  07/31/20 124/75  01/27/20 (!) 142/76   Lab Results  Component Value Date   CREATININE 0.91 10/23/2020   Hyperlipidemia: Lipitor 40 mg daily. No new myalgias.  Lab Results  Component Value Date   CHOL 191 10/23/2020   HDL 37 (L) 10/23/2020   LDLCALC 116 (H) 10/23/2020   LDLDIRECT 144 (H) 10/26/2012   TRIG 217 (H) 10/23/2020   CHOLHDL 5.2 (H) 10/23/2020   Lab Results  Component Value Date   ALT 9 10/23/2020   AST 13 10/23/2020   ALKPHOS 121 10/23/2020   BILITOT 0.3 10/23/2020    Hypercalcemia: Calcium increased from 10.1 in June   20 20-10.8 in December 2020, has remained elevated from 10.5-10.8 since that time Lab Results  Component Value Date   PTH 27 10/23/2020   PTH Comment 10/23/2020   CALCIUM 10.7 (H) 10/23/2020   CAION 5.2 01/27/2020      History Patient Active Problem List   Diagnosis Date Noted  . History of colonic polyps 11/04/2019  . Uncontrolled type 2 diabetes mellitus with hyperglycemia (HCC) 03/09/2018  . Carcinoid tumor of rectum   . Sepsis due to urinary tract infection (HCC) 02/02/2015  . Essential hypertension 02/02/2015  . Atherosclerosis of native arteries of extremity with intermittent claudication (HCC)  02/02/2015  . Sepsis (HCC) 02/02/2015  . Stress incontinence in female 03/06/2014  . 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 Diabetes mellitus (HCC) 02/28/2012  . Pure hypercholesterolemia 02/28/2012  . Hypertension 02/28/2012   Past Medical History:  Diagnosis Date  . Cancer (HCC) 2010   rectal CA  . Cataract   . Eczema   . Hyperlipidemia   . Hypertension   . Intrinsic (urethral) sphincter deficiency (ISD)   . RBBB   . SUI (stress urinary incontinence, female)    hx of  . Type 2 diabetes mellitus (HCC)   . Wears dentures    UPPER  . Wears glasses    Past Surgical History:  Procedure Laterality Date  . ABDOMINAL HYSTERECTOMY  1998   fibroids, partial  . ABDOMINOPLASTY  1999  . BLADDER SUSPENSION N/A 03/06/2014   Procedure: SPARC SLING ;  Surgeon: John Wrenn, MD;  Location: [facility];  Service: Urology;  Laterality: N/A;  . CARDIOVASCULAR STRESS TEST  03-27-2008   NORMAL PERFUSION STUDY/  EF 54%  . COLON SURGERY  2010   for rectal ca  . COLONOSCOPY    . EUS N/A 03/02/2017   Procedure: LOWER ENDOSCOPIC ULTRASOUND (EUS);  Surgeon: Daniel P Jacobs, MD;  Location: [facility];  Service: Endoscopy;  Laterality: N/A;  . EXCISION LOWER ABDOMINAL SCAR POST ABDOMINOPLASTY  01-05-2000  . ORIF RIGHT ANKLE FX  12-24-2008  . RIGHT ANKLE ARTHROSCOPY W/ DEBRIDEMENT AND OPEN REMOVAL HARDWARE  03-12-2010  . TRANSTHORACIC ECHOCARDIOGRAM  08-05-2009   MILD LVH/  EF 60-65%  . TUBAL LIGATION     No Known Allergies Prior to Admission medications   Medication Sig Start Date End Date Taking? Authorizing Provider  amLODipine (NORVASC) 10 MG tablet Take 1 tablet (10 mg total) by mouth daily. 07/31/20  Yes Santiago Lago, Irma M, MD  aspirin 81 MG tablet Take 81 mg by mouth daily.   Yes [provider]  atorvastatin (LIPITOR) 40 MG tablet Take 1 tablet (40 mg total) by mouth every morning. 07/31/20  Yes Santiago Lago, Irma M, MD  dapagliflozin propanediol (FARXIGA)  10 MG TABS tablet Take 1 tablet (10 mg total) by mouth daily. 07/31/20  Yes Santiago Lago, Irma M, MD  glucose blood (ACCU-CHEK SMARTVIEW) test strip PATIENT NEEDS OFFICE VISIT FOR ADDITIONAL REFILLS - very overdue for DM check up. 11/11/15  Yes English, Stephanie D, PA  lisinopril (ZESTRIL) 10 MG tablet TAKE 1 TABLET BY MOUTH ONCE DAILY IN THE MORNING 07/31/20  Yes Santiago Lago, Irma M, MD  metFORMIN (GLUCOPHAGE) 1000 MG tablet TAKE 1 TABLET BY MOUTH TWICE DAILY WITH A MEAL 07/31/20  Yes Santiago Lago, Irma M, MD  Semaglutide (RYBELSUS) 3 MG TABS Take 3 mg by mouth daily. 07/31/20  Yes Santiago Lago, Irma M, MD  Semaglutide (RYBELSUS) 7 MG TABS Take 7 mg by mouth daily.   08/30/20  Yes Santiago Lago, Irma M, MD   Social History   Socioeconomic History  . Marital status: Married    Spouse name: Not on file  . Number of children: 1  . Years of education: Not on file  . Highest education level: Not on file  Occupational History  . Not on file  Tobacco Use  . Smoking status: Former Smoker    Years: 20.00    Types: Cigarettes    Quit date: 11/14/2006    Years since quitting: 13.9  . Smokeless tobacco: Never Used  Vaping Use  . Vaping Use: Never used  Substance and Sexual Activity  . Alcohol use: No  . Drug use: No  . Sexual activity: Not on file  Other Topics Concern  . Not on file  Social History Narrative  . Not on file   Social Determinants of Health   Financial Resource Strain: Not on file  Food Insecurity: Not on file  Transportation Needs: Not on file  Physical Activity: Not on file  Stress: Not on file  Social Connections: Not on file  Intimate Partner Violence: Not on file    Review of Systems  Constitutional: Positive for fatigue. Negative for unexpected weight change.  Respiratory: Negative for chest tightness and shortness of breath.   Cardiovascular: Negative for chest pain, palpitations and leg swelling.  Gastrointestinal: Negative for abdominal pain and blood in  stool.  Neurological: Positive for headaches (intermittent). Negative for dizziness, syncope and light-headedness.     Objective:   Vitals:   10/23/20 1555 10/23/20 1558  BP: (!) 144/79 128/70  Pulse: 86   Temp: 98.2 F (36.8 C)   TempSrc: Temporal   SpO2: 98%   Weight: 119 lb (54 kg)   Height: 4' 8" (1.422 m)      Physical Exam Vitals reviewed.  Constitutional:      Appearance: She is well-developed and well-nourished.  HENT:     Head: Normocephalic and atraumatic.  Eyes:     Extraocular Movements: EOM normal.     Conjunctiva/sclera: Conjunctivae normal.     Pupils: Pupils are equal, round, and reactive to light.  Neck:     Vascular: No carotid bruit.     Comments: No thyromegaly,mass appreciated.  Cardiovascular:     Rate and Rhythm: Normal rate and regular rhythm.     Pulses: Intact distal pulses.     Heart sounds: Normal heart sounds.  Pulmonary:     Effort: Pulmonary effort is normal.     Breath sounds: Normal breath sounds.  Abdominal:     Palpations: Abdomen is soft. There is no pulsatile mass.     Tenderness: There is no abdominal tenderness.  Musculoskeletal:     Cervical back: Neck supple.  Skin:    General: Skin is warm and dry.  Neurological:     Mental Status: She is alert and oriented to person, place, and time.  Psychiatric:        Mood and Affect: Mood and affect normal.        Behavior: Behavior normal.      Results for orders placed or performed in visit on 10/23/20  Comprehensive metabolic panel  Result Value Ref Range   Glucose 127 (H) 65 - 99 mg/dL   BUN 12 8 - 27 mg/dL   Creatinine, Ser 0.91 0.57 - 1.00 mg/dL   GFR calc non Af Amer 67 >59 mL/min/1.73   GFR calc Af Amer 78 >59 mL/min/1.73   BUN/Creatinine   Ratio 13 12 - 28   Sodium 141 134 - 144 mmol/L   Potassium 4.6 3.5 - 5.2 mmol/L   Chloride 99 96 - 106 mmol/L   CO2 23 20 - 29 mmol/L   Calcium 10.7 (H) 8.7 - 10.3 mg/dL   Total Protein 8.1 6.0 - 8.5 g/dL   Albumin 4.5 3.8 -  4.8 g/dL   Globulin, Total 3.6 1.5 - 4.5 g/dL   Albumin/Globulin Ratio 1.3 1.2 - 2.2   Bilirubin Total 0.3 0.0 - 1.2 mg/dL   Alkaline Phosphatase 121 44 - 121 IU/L   AST 13 0 - 40 IU/L   ALT 9 0 - 32 IU/L  Lipid panel  Result Value Ref Range   Cholesterol, Total 191 100 - 199 mg/dL   Triglycerides 217 (H) 0 - 149 mg/dL   HDL 37 (L) >39 mg/dL   VLDL Cholesterol Cal 38 5 - 40 mg/dL   LDL Chol Calc (NIH) 116 (H) 0 - 99 mg/dL   Chol/HDL Ratio 5.2 (H) 0.0 - 4.4 ratio  CBC  Result Value Ref Range   WBC 8.3 3.4 - 10.8 x10E3/uL   RBC 5.35 (H) 3.77 - 5.28 x10E6/uL   Hemoglobin 15.2 11.1 - 15.9 g/dL   Hematocrit 46.2 34.0 - 46.6 %   MCV 86 79 - 97 fL   MCH 28.4 26.6 - 33.0 pg   MCHC 32.9 31.5 - 35.7 g/dL   RDW 12.8 11.7 - 15.4 %   Platelets 319 150 - 450 x10E3/uL  PTH, Intact and Calcium  Result Value Ref Range   PTH 27 15 - 65 pg/mL   PTH Interp Comment   TSH  Result Value Ref Range   TSH 0.876 0.450 - 4.500 uIU/mL  POCT glucose (manual entry)  Result Value Ref Range   POC Glucose 124 (A) 70 - 99 mg/dl  POCT glycosylated hemoglobin (Hb A1C)  Result Value Ref Range   Hemoglobin A1C 9.9 (A) 4.0 - 5.6 %   HbA1c POC (<> result, manual entry)     HbA1c, POC (prediabetic range)     HbA1c, POC (controlled diabetic range)       Assessment & Plan:  Maximina C Apuzzo is a 64 y.o. female . Type 2 diabetes mellitus with hyperglycemia, without long-term current use of insulin (HCC) - Plan: POCT glucose (manual entry), POCT glycosylated hemoglobin (Hb A1C), metFORMIN (GLUCOPHAGE) 1000 MG tablet, dapagliflozin propanediol (FARXIGA) 10 MG TABS tablet, Semaglutide (RYBELSUS) 7 MG TABS  -Unfortunately still uncontrolled.  May need to change to insulin but will initially try higher dose of Rybelsus.  Potential side effects discussed and return to prior dose if new side effects.  Continue Metformin, Farxiga same dose for now.  Recheck in approximately next 1 month with home readings fasting and  2-hour postprandial to help guide changes further.  Essential hypertension - Plan: lisinopril (ZESTRIL) 10 MG tablet, amLODipine (NORVASC) 10 MG tablet  -Stable, continue same regimen  Hypercalcemia - Plan: Comprehensive metabolic panel, PTH, Intact and Calcium  -Repeat calcium with PTH.  Pure hypercholesterolemia - Plan: Lipid panel, atorvastatin (LIPITOR) 40 MG tablet  -Tolerating Lipitor, continue same  Other fatigue - Plan: CBC, TSH  -Likely related to persistent hyperglycemia.  Check CBC, TSH.  RTC precautions.  Meds ordered this encounter  Medications  . metFORMIN (GLUCOPHAGE) 1000 MG tablet    Sig: TAKE 1 TABLET BY MOUTH TWICE DAILY WITH A MEAL    Dispense:  180 tablet    Refill:  1  .   lisinopril (ZESTRIL) 10 MG tablet    Sig: TAKE 1 TABLET BY MOUTH ONCE DAILY IN THE MORNING    Dispense:  90 tablet    Refill:  1  . amLODipine (NORVASC) 10 MG tablet    Sig: Take 1 tablet (10 mg total) by mouth daily.    Dispense:  90 tablet    Refill:  3  . dapagliflozin propanediol (FARXIGA) 10 MG TABS tablet    Sig: Take 1 tablet (10 mg total) by mouth daily.    Dispense:  90 tablet    Refill:  1  . atorvastatin (LIPITOR) 40 MG tablet    Sig: Take 1 tablet (40 mg total) by mouth every morning.    Dispense:  90 tablet    Refill:  3  . Semaglutide (RYBELSUS) 7 MG TABS    Sig: Take 14 mg by mouth daily.    Dispense:  60 tablet    Refill:  2   Patient Instructions    Continue Metformin, Farxiga same doses for now.  Can try higher dose of Rybelsus (52m - 2 pills) but may need to adjust medicines further at follow-up visit in January.  If any worsening nausea or other new side effects at higher dose, return to 7 mg and let me know.  Try to check your blood sugar either fasting, or 2 hours after meals and check blood sugar once every 2 days at the minimum.  Bring those readings with you to next visit.  I will check some other blood tests for fatigue, but if any worsening fatigue or  headaches be seen sooner than January visit.  Return to the clinic or go to the nearest emergency room if any of your symptoms worsen or new symptoms occur.    Type 2 Diabetes Mellitus, Self Care, Adult Caring for yourself after you have been diagnosed with type 2 diabetes (type 2 diabetes mellitus) means keeping your blood sugar (glucose) under control with a balance of:  Nutrition.  Exercise.  Lifestyle changes.  Medicines or insulin, if necessary.  Support from your team of health care providers and others. The following information explains what you need to know to manage your diabetes at home. What are the risks? Having diabetes can put you at risk for other long-term (chronic) conditions, such as heart disease and kidney disease. Your health care provider may prescribe medicines to help prevent complications from diabetes. These medicines may include:  Aspirin.  Medicine to lower cholesterol.  Medicine to control blood pressure. How to monitor blood glucose   Check your blood glucose every day, as often as told by your health care provider.  Have your A1c (hemoglobin A1c) level checked two or more times a year, or as often as told by your health care provider. Your health care provider will set individualized treatment goals for you. Generally, the goal of treatment is to maintain the following blood glucose levels:  Before meals (preprandial): 80-130 mg/dL (4.4-7.2 mmol/L).  After meals (postprandial): below 180 mg/dL (10 mmol/L).  A1c level: less than 7%. How to manage hyperglycemia and hypoglycemia Hyperglycemia symptoms Hyperglycemia, also called high blood glucose, occurs when blood glucose is too high. Make sure you know the early signs of hyperglycemia, such as:  Increased thirst.  Hunger.  Feeling very tired.  Needing to urinate more often than usual.  Blurry vision. Hypoglycemia symptoms Hypoglycemia, also called low blood glucose, occurswith a  blood glucose level at or below 70 mg/dL (3.9 mmol/L). The risk  for hypoglycemia increases during or after exercise, during sleep, during illness, and when skipping meals or not eating for a long time (fasting). It is important to know the symptoms of hypoglycemia and treat it right away. Always have a 15-gram rapid-acting carbohydrate snack with you to treat low blood glucose. Family members and close friends should also know the symptoms and should understand how to treat hypoglycemia, in case you are not able to treat yourself. Symptoms may include:  Hunger.  Anxiety.  Sweating and feeling clammy.  Confusion.  Dizziness or feeling light-headed.  Sleepiness.  Nausea.  Increased heart rate.  Headache.  Blurry vision.  Irritability.  A change in coordination.  Tingling or numbness around the mouth, lips, or tongue.  Restless sleep.  Fainting.  Seizure. Treating hypoglycemia If you are alert and able to swallow safely, follow the 15:15 rule:  Take 15 grams of a rapid-acting carbohydrate. Talk with your health care provider about how much you should take.  Rapid-acting options include: ? Glucose pills (take 15 grams). ? 6-8 pieces of hard candy. ? 4-6 oz (120-150 mL) of fruit juice. ? 4-6 oz (120-150 mL) of regular (not diet) soda. ? 1 Tbsp (15 mL) honey or sugar.  Check your blood glucose 15 minutes after you take the carbohydrate.  If the repeat blood glucose level is still at or below 70 mg/dL (3.9 mmol/L), take 15 grams of a carbohydrate again.  If your blood glucose level does not increase above 70 mg/dL (3.9 mmol/L) after 3 tries, seek emergency medical care.  After your blood glucose level returns to normal, eat a meal or a snack within 1 hour. Treating severe hypoglycemia Severe hypoglycemia is when your blood glucose level is at or below 54 mg/dL (3 mmol/L). Severe hypoglycemia is an emergency. Do not wait to see if the symptoms will go away. Get medical  help right away. Call your local emergency services (911 in the [place] If you have severe hypoglycemia and you cannot eat or drink, you may need an injection of glucagon. A family member or close friend should learn how to check your blood glucose and how to give you a glucagon injection. Ask your health care provider if you need to have an emergency glucagon injection kit available. Severe hypoglycemia may need to be treated in a hospital. The treatment may include getting glucose through an IV. You may also need treatment for the cause of your hypoglycemia. Follow these instructions at home: Take diabetes medicines as told  If your health care provider prescribed insulin or diabetes medicines, take them every day.  Do not run out of insulin or other diabetes medicines that you take. Plan ahead so you always have these available.  If you use insulin, adjust your dosage based on how physically active you are and what foods you eat. Your health care provider will tell you how to adjust your dosage. Make healthy food choices  The things that you eat and drink affect your blood glucose and your insulin dosage. Making good choices helps to control your diabetes and prevent other health problems. A healthy meal plan includes eating lean proteins, complex carbohydrates, fresh fruits and vegetables, low-fat dairy products, and healthy fats. Make an appointment to see a diet and nutrition specialist (registered dietitian) to help you create an eating plan that is right for you. Make sure that you:  Follow instructions from your health care provider about eating or drinking restrictions.  Drink enough fluid to keep your  urine pale yellow.  Keep a record of the carbohydrates that you eat. Do this by reading food labels and learning the standard serving sizes of foods.  Follow your sick day plan whenever you cannot eat or drink as usual. Make this plan in advance with your health care provider.  Stay  active Exercise regularly, as told by your health care provider. This may include:  Stretching and doing strength exercises, such as yoga or weightlifting, 2 or more times a week.  Doing 150 minutes or more of moderate-intensity or vigorous-intensity exercise each week. This could be brisk walking, biking, or water aerobics. ? Spread out your activity over 3 or more days of the week. ? Do not go more than 2 days in a row without doing some kind of physical activity. When you start a new exercise or activity, work with your health care provider to adjust your insulin, medicines, or food intake as needed. Make healthy lifestyle choices  Do not use any tobacco products, such as cigarettes, chewing tobacco, and e-cigarettes. If you need help quitting, ask your health care provider.  If your health care provider says that alcohol is safe for you, limit alcohol intake to no more than 1 drink per day for nonpregnant women and 2 drinks per day for men. One drink equals 12 oz of beer (355 mL), 5 oz of wine (148 mL), or 1 oz of hard liquor (44 mL).  Learn to manage stress. If you need help with this, ask your health care provider. Care for your body   Keep your immunizations up to date. In addition to getting vaccinations as told by your health care provider, it is recommended that you get vaccinated against the following illnesses: ? The flu (influenza). Get a flu shot every year. ? Pneumonia. ? Hepatitis B.  Schedule an eye exam soon after your diagnosis, and then one time every year after that.  Check your skin and feet every day for cuts, bruises, redness, blisters, or sores. Schedule a foot exam with your health care provider once every year.  Brush your teeth and gums two times a day, and floss one or more times a day. Visit your dentist one or more times every 6 months.  Maintain a healthy weight. General instructions  Take over-the-counter and prescription medicines only as told by  your health care provider.  Share your diabetes management plan with people in your workplace, school, and household.  Carry a medical alert card or wear medical alert jewelry.  Keep all follow-up visits as told by your health care provider. This is important. Questions to ask your health care provider  Do I need to meet with a diabetes educator?  Where can I find a support group for people with diabetes? Where to find more information For more information about diabetes, visit:  American Diabetes Association (ADA): www.diabetes.org  American Association of Diabetes Educators (AADE): www.diabeteseducator.org Summary  Caring for yourself after you have been diagnosed with (type 2 diabetes mellitus) means keeping your blood sugar (glucose) under control with a balance of nutrition, exercise, lifestyle changes, and medicine.  Check your blood glucose every day, as often as told by your health care provider.  Having diabetes can put you at risk for other long-term (chronic) conditions, such as heart disease and kidney disease. Your health care provider may prescribe medicines to help prevent complications from diabetes.  Keep all follow-up visits as told by your health care provider. This is important. This information is   not intended to replace advice given to you by your health care provider. Make sure you discuss any questions you have with your health care provider. Document Revised: 04/23/2018 Document Reviewed: 12/04/2015 Elsevier Patient Education  2020 Elsevier Inc.    If you have lab work done today you will be contacted with your lab results within the next 2 weeks.  If you have not heard from us then please contact us. The fastest way to get your results is to register for My Chart.   IF you received an x-ray today, you will receive an invoice from [facility]. Please contact [facility] at [phone] with questions or concerns regarding your invoice.    IF you received labwork today, you will receive an invoice from [facility]. Please contact [facility] at [phone] with questions or concerns regarding your invoice.   Our billing staff will not be able to assist you with questions regarding bills from these companies.  You will be contacted with the lab results as soon as they are available. The fastest way to get your results is to activate your My Chart account. Instructions are located on the last page of this paperwork. If you have not heard from us regarding the results in 2 weeks, please contact this office.         Signed,  , MD [facility]  

## 2020-10-24 ENCOUNTER — Encounter: Payer: Self-pay | Admitting: Family Medicine

## 2020-10-24 LAB — CBC
Hematocrit: 46.2 % (ref 34.0–46.6)
Hemoglobin: 15.2 g/dL (ref 11.1–15.9)
MCH: 28.4 pg (ref 26.6–33.0)
MCHC: 32.9 g/dL (ref 31.5–35.7)
MCV: 86 fL (ref 79–97)
Platelets: 319 10*3/uL (ref 150–450)
RBC: 5.35 x10E6/uL — ABNORMAL HIGH (ref 3.77–5.28)
RDW: 12.8 % (ref 11.7–15.4)
WBC: 8.3 10*3/uL (ref 3.4–10.8)

## 2020-10-24 LAB — COMPREHENSIVE METABOLIC PANEL
ALT: 9 IU/L (ref 0–32)
AST: 13 IU/L (ref 0–40)
Albumin/Globulin Ratio: 1.3 (ref 1.2–2.2)
Albumin: 4.5 g/dL (ref 3.8–4.8)
Alkaline Phosphatase: 121 IU/L (ref 44–121)
BUN/Creatinine Ratio: 13 (ref 12–28)
BUN: 12 mg/dL (ref 8–27)
Bilirubin Total: 0.3 mg/dL (ref 0.0–1.2)
CO2: 23 mmol/L (ref 20–29)
Calcium: 10.7 mg/dL — ABNORMAL HIGH (ref 8.7–10.3)
Chloride: 99 mmol/L (ref 96–106)
Creatinine, Ser: 0.91 mg/dL (ref 0.57–1.00)
GFR calc Af Amer: 78 mL/min/{1.73_m2} (ref >59)
GFR calc non Af Amer: 67 mL/min/{1.73_m2} (ref >59)
Globulin, Total: 3.6 g/dL (ref 1.5–4.5)
Glucose: 127 mg/dL — ABNORMAL HIGH (ref 65–99)
Potassium: 4.6 mmol/L (ref 3.5–5.2)
Sodium: 141 mmol/L (ref 134–144)
Total Protein: 8.1 g/dL (ref 6.0–8.5)

## 2020-10-24 LAB — LIPID PANEL
Chol/HDL Ratio: 5.2 ratio — ABNORMAL HIGH (ref 0.0–4.4)
Cholesterol, Total: 191 mg/dL (ref 100–199)
HDL: 37 mg/dL — ABNORMAL LOW (ref >39)
LDL Chol Calc (NIH): 116 mg/dL — ABNORMAL HIGH (ref 0–99)
Triglycerides: 217 mg/dL — ABNORMAL HIGH (ref 0–149)
VLDL Cholesterol Cal: 38 mg/dL (ref 5–40)

## 2020-10-24 LAB — PTH, INTACT AND CALCIUM: PTH: 27 pg/mL (ref 15–65)

## 2020-10-24 LAB — TSH: TSH: 0.876 u[IU]/mL (ref 0.450–4.500)

## 2020-10-27 ENCOUNTER — Telehealth: Payer: Self-pay

## 2020-10-27 NOTE — Telephone Encounter (Signed)
[facility] called [phone]) requesting change to Rybelsus script. Pt.'s insurance will not cover 7mg  twice daily but will cover 14mg  once daily. Pharmacy requested change to script on behalf of pt.

## 2020-10-29 ENCOUNTER — Other Ambulatory Visit: Payer: Self-pay | Admitting: Family Medicine

## 2020-10-29 DIAGNOSIS — Z1231 Encounter for screening mammogram for malignant neoplasm of breast: Secondary | ICD-10-CM

## 2020-11-18 ENCOUNTER — Encounter: Payer: Self-pay | Admitting: Family Medicine

## 2020-11-18 ENCOUNTER — Other Ambulatory Visit: Payer: Self-pay

## 2020-12-07 ENCOUNTER — Encounter: Payer: Self-pay | Admitting: Family Medicine

## 2020-12-21 ENCOUNTER — Other Ambulatory Visit: Payer: Self-pay | Admitting: Family Medicine

## 2020-12-21 DIAGNOSIS — E1165 Type 2 diabetes mellitus with hyperglycemia: Secondary | ICD-10-CM

## 2020-12-21 MED ORDER — RYBELSUS 7 MG PO TABS: 14.0000 mg | ORAL_TABLET | Freq: Every day | ORAL | 2 refills | Status: DC

## 2020-12-21 NOTE — Telephone Encounter (Signed)
Medication Refill - Medication: Semaglutide (RYBELSUS) 7 MG TABS    Has the patient contacted their pharmacy? Yes.   (Agent: If no, request that the patient contact the pharmacy for the refill.) (Agent: If yes, when and what did the pharmacy advise?) Pharmacy stated they contacted office with no success and to advice pcp to send 14Mg  instead of 7 due to pt running out fast / please advise   Preferred Pharmacy (with phone number or street name): [facility], [address], [place] [address]  Phone:  [phone] Fax:  [phone]   Agent: Please be advised that RX refills may take up to 3 business days. We ask that you follow-up with your pharmacy.

## 2020-12-21 NOTE — Telephone Encounter (Signed)
Requested medication (s) are due for refill today - yes  Requested medication (s) are on the active medication list -yes  Future visit scheduled -yes  Last refill: 1 month  Notes to clinic: Request RF of medication not assigned protocol  Requested Prescriptions  Pending Prescriptions Disp Refills   Semaglutide (RYBELSUS) 7 MG TABS 60 tablet 2    Sig: Take 14 mg by mouth daily.      Off-Protocol Failed - 12/21/2020  1:01 PM      Failed - Medication not assigned to a protocol, review manually.      Passed - Valid encounter within last 12 months    Recent Outpatient Visits           1 month ago Type 2 diabetes mellitus with hyperglycemia, without long-term current use of insulin [facility])   Primary [facility] at [facility], Ranell Patrick, MD   4 months ago Type 2 diabetes mellitus with hyperglycemia, without long-term current use of insulin [facility])   Primary Care at [facility], Lilia Argue, MD   10 months ago Essential hypertension   Primary Care at [facility], Lilia Argue, MD   1 year ago Type 2 diabetes mellitus with hyperglycemia, with long-term current use of insulin [facility])   Primary Care at [facility], Lilia Argue, MD   1 year ago Vaginal yeast infection   Primary Care at [facility], Lilia Argue, MD       Future Appointments             In 2 months [name], MD Primary Care at [place], [facility]                 Requested Prescriptions  Pending Prescriptions Disp Refills   Semaglutide (RYBELSUS) 7 MG TABS 60 tablet 2    Sig: Take 14 mg by mouth daily.      Off-Protocol Failed - 12/21/2020  1:01 PM      Failed - Medication not assigned to a protocol, review manually.      Passed - Valid encounter within last 12 months    Recent Outpatient Visits           1 month ago Type 2 diabetes mellitus with hyperglycemia, without long-term current use of insulin [facility])   [facility] at [facility], Ranell Patrick, MD   4 months ago Type 2 diabetes mellitus with  hyperglycemia, without long-term current use of insulin [facility])   Primary Care at [facility], Lilia Argue, MD   10 months ago Essential hypertension   Primary Care at [facility], Lilia Argue, MD   1 year ago Type 2 diabetes mellitus with hyperglycemia, with long-term current use of insulin [facility])   Primary Care at [facility], Lilia Argue, MD   1 year ago Vaginal yeast infection   Primary Care at [facility], Lilia Argue, MD       Future Appointments             In 2 months Carlota Raspberry Ranell Patrick, MD Primary Care at [place], [facility]

## 2020-12-27 ENCOUNTER — Encounter: Payer: Self-pay | Admitting: Family Medicine

## 2020-12-29 ENCOUNTER — Telehealth: Payer: Self-pay | Admitting: *Deleted

## 2020-12-29 MED ORDER — RYBELSUS 14 MG PO TABS: 14.0000 mg | ORAL_TABLET | Freq: Every day | ORAL | 3 refills | Status: DC

## 2020-12-29 NOTE — Addendum Note (Signed)
Addended by: Merri Ray R on: 12/29/2020 02:32 PM   Modules accepted: Orders

## 2020-12-29 NOTE — Telephone Encounter (Signed)
Spoke to pharmacy they suggest to prescribe 14 mg and have patient cut the pill in half.   Sent the message on to Dr. Carlota Raspberry to see if this is acceptable

## 2020-12-29 NOTE — Telephone Encounter (Signed)
Dr. Carlota Raspberry,  The pharmacy stated the insurance will not cover the 2 times a day tablet and suggest to increase the dosage to 14 mg and have patient cut in half.

## 2020-12-29 NOTE — Telephone Encounter (Signed)
Should be on 14mg  - I have sent that Rx. If unable to tolerate 14mg , return to 7mg  - can send new rx if needed for that dose.

## 2020-12-30 ENCOUNTER — Other Ambulatory Visit: Payer: Self-pay

## 2020-12-30 ENCOUNTER — Ambulatory Visit
Admission: RE | Admit: 2020-12-30 | Discharge: 2020-12-30 | Disposition: A | Discharge status: Home / Self Care | Payer: BC Managed Care – PPO | Source: Ambulatory Visit | Attending: Family Medicine | Admitting: Family Medicine

## 2020-12-30 DIAGNOSIS — Z1231 Encounter for screening mammogram for malignant neoplasm of breast: Secondary | ICD-10-CM

## 2020-12-31 ENCOUNTER — Encounter: Payer: Self-pay | Admitting: Family Medicine

## 2021-01-05 ENCOUNTER — Other Ambulatory Visit: Payer: Self-pay | Admitting: Family Medicine

## 2021-01-05 DIAGNOSIS — E1165 Type 2 diabetes mellitus with hyperglycemia: Secondary | ICD-10-CM

## 2021-01-05 MED ORDER — RYBELSUS 14 MG PO TABS: 14.0000 mg | ORAL_TABLET | Freq: Every day | ORAL | 1 refills | Status: DC

## 2021-02-19 ENCOUNTER — Encounter: Payer: Self-pay | Admitting: Family Medicine

## 2021-03-02 ENCOUNTER — Other Ambulatory Visit: Payer: Self-pay

## 2021-03-02 ENCOUNTER — Ambulatory Visit
Admission: EM | Admit: 2021-03-02 | Discharge: 2021-03-02 | Disposition: A | Discharge status: Home / Self Care | Payer: BC Managed Care – PPO | Attending: Family Medicine | Admitting: Family Medicine

## 2021-03-02 ENCOUNTER — Encounter: Payer: Self-pay | Admitting: Emergency Medicine

## 2021-03-02 DIAGNOSIS — R059 Cough, unspecified: Secondary | ICD-10-CM

## 2021-03-02 DIAGNOSIS — J069 Acute upper respiratory infection, unspecified: Secondary | ICD-10-CM

## 2021-03-02 MED ORDER — PROMETHAZINE-DM 6.25-15 MG/5ML PO SYRP: 5.0000 mL | ORAL_SOLUTION | Freq: Four times a day (QID) | ORAL | 0 refills | Status: DC | PRN

## 2021-03-02 MED ORDER — LEVOCETIRIZINE DIHYDROCHLORIDE 5 MG PO TABS: 5.0000 mg | ORAL_TABLET | Freq: Every evening | ORAL | 0 refills | Status: DC

## 2021-03-02 MED ORDER — PROMETHAZINE-PHENYLEPHRINE 6.25-5 MG/5ML PO SYRP: 5.0000 mL | ORAL_SOLUTION | ORAL | 0 refills | Status: DC | PRN

## 2021-03-02 MED ORDER — BENZONATATE 100 MG PO CAPS: 100.0000 mg | ORAL_CAPSULE | Freq: Three times a day (TID) | ORAL | 0 refills | Status: DC | PRN

## 2021-03-02 MED ORDER — IPRATROPIUM BROMIDE 0.03 % NA SOLN: 2.0000 | Freq: Two times a day (BID) | NASAL | 0 refills | Status: DC

## 2021-03-02 NOTE — ED Triage Notes (Signed)
Pt presents with dry cough, itchy and watery eyes, and sneezing xs 4 days.

## 2021-03-02 NOTE — ED Provider Notes (Signed)
[facility]    CSN: [id number] Arrival date & time: 03/02/21  0815      History   Chief Complaint Chief Complaint  Patient presents with  . Cough  . Nasal Congestion    HPI Gabrielle [name] is a 65 y.o. female.   HPI Patient presents with URI symptoms including cough, sore throat, otalgia, nasal congestion, runny nose, and sinus pressure. Denies fever or known sick contacts. Denies worrisome symptoms of shortness of breath, weakness.  She has not taken any medication for her symptoms.  Past Medical History:  Diagnosis Date  . Cancer ([place]) 2010   rectal CA  . Cataract   . Diabetes mellitus without complication ([place])    Phreesia 11/15/2020  . Eczema   . Hyperlipidemia   . Hypertension   . Intrinsic (urethral) sphincter deficiency (ISD)   . RBBB   . SUI (stress urinary incontinence, female)    hx of  . Type 2 diabetes mellitus ([place])   . Wears dentures    UPPER  . Wears glasses     Patient Active Problem List   Diagnosis Date Noted  . History of colonic polyps 11/04/2019  . Uncontrolled type 2 diabetes mellitus with hyperglycemia ([place]) 03/09/2018  . Carcinoid tumor of rectum   . Sepsis due to urinary tract infection ([place]) 02/02/2015  . Essential hypertension 02/02/2015  . Atherosclerosis of native arteries of extremity with intermittent claudication ([place]) 02/02/2015  . Sepsis ([place]) 02/02/2015  . Stress incontinence in female 03/06/2014  . 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 Diabetes mellitus ([place]) 02/28/2012  . Pure hypercholesterolemia 02/28/2012  . Hypertension 02/28/2012    Past Surgical History:  Procedure Laterality Date  . ABDOMINAL HYSTERECTOMY  1998   fibroids, partial  . ABDOMINOPLASTY  1999  . BLADDER SUSPENSION N/A 03/06/2014   Procedure: [facility] SLING ;  Surgeon: Irine Seal, MD;  Location: [facility];  Service: Urology;  Laterality: N/A;  . CARDIOVASCULAR STRESS TEST  03-27-2008   NORMAL PERFUSION STUDY/  EF  54%  . COLON SURGERY  2010   for rectal ca  . COLONOSCOPY    . EUS N/A 03/02/2017   Procedure: LOWER ENDOSCOPIC ULTRASOUND (EUS);  Surgeon: Milus Banister, MD;  Location: [facility];  Service: Endoscopy;  Laterality: N/A;  . EXCISION LOWER ABDOMINAL SCAR POST ABDOMINOPLASTY  01-05-2000  . ORIF RIGHT ANKLE FX  12-24-2008  . RIGHT ANKLE ARTHROSCOPY W/ DEBRIDEMENT AND OPEN REMOVAL HARDWARE  03-12-2010  . TRANSTHORACIC ECHOCARDIOGRAM  08-05-2009   MILD LVH/  EF 60-65%  . TUBAL LIGATION      OB History   No obstetric history on file.      Home Medications    Prior to Admission medications   Medication Sig Start Date End Date Taking? Authorizing Provider  amLODipine (NORVASC) 10 MG tablet Take 1 tablet (10 mg total) by mouth daily. 10/23/20   [name], MD  aspirin 81 MG tablet Take 81 mg by mouth daily.    [provider]  atorvastatin (LIPITOR) 40 MG tablet Take 1 tablet (40 mg total) by mouth every morning. 10/23/20   [name], MD  dapagliflozin propanediol (FARXIGA) 10 MG TABS tablet Take 1 tablet (10 mg total) by mouth daily. 10/23/20   [name], MD  glucose blood (ACCU-CHEK SMARTVIEW) test strip PATIENT NEEDS OFFICE VISIT FOR ADDITIONAL REFILLS - very overdue for DM check up. 11/11/15   Ivar Drape D, PA  lisinopril (ZESTRIL) 10  MG tablet TAKE 1 TABLET BY MOUTH ONCE DAILY IN THE MORNING 10/23/20   [name], MD  metFORMIN (GLUCOPHAGE) 1000 MG tablet TAKE 1 TABLET BY MOUTH TWICE DAILY WITH A MEAL 10/23/20   [name], MD  Semaglutide (RYBELSUS) 14 MG TABS Take 14 mg by mouth daily. 01/05/21   [name], MD    Family History Family History  Problem Relation Age of Onset  . Diabetes Mother   . Hypertension Mother   . Diabetes Sister   . Hypertension Sister   . Diabetes Brother   . Hypertension Brother   . Lupus Daughter   . Colon cancer Neg Hx   . Esophageal cancer Neg Hx   . Rectal cancer Neg Hx   .  Stomach cancer Neg Hx   . Colon polyps Neg Hx     Social History Social History   Tobacco Use  . Smoking status: Former Smoker    Years: 20.00    Types: Cigarettes    Quit date: 11/14/2006    Years since quitting: 14.3  . Smokeless tobacco: Never Used  Vaping Use  . Vaping Use: Never used  Substance Use Topics  . Alcohol use: No  . Drug use: No     Allergies   Patient has no known allergies.   Review of Systems Review of Systems Pertinent negatives listed in HPI   Physical Exam Triage Vital Signs ED Triage Vitals [03/02/21 0827]  Enc Vitals Group     BP 132/82     Pulse Rate 80     Resp 16     Temp 98.6 F (37 C)     Temp Source Oral     SpO2 98 %     Weight      Height      Head Circumference      Peak Flow      Pain Score 0     Pain Loc      Pain Edu?      Excl. in [place]?    No data found.  Updated Vital Signs BP 132/82 (BP Location: Left Arm)   Pulse 80   Temp 98.6 F (37 C) (Oral)   Resp 16   SpO2 98%   Visual Acuity Right Eye Distance:   Left Eye Distance:   Bilateral Distance:    Right Eye Near:   Left Eye Near:    Bilateral Near:     Physical Exam  General Appearance:    Alert, cooperative, no distress  HENT:   Normocephalic, ears normal, nares mucosal edema with congestion, rhinorrhea, oropharynx    Eyes:    PERRL, conjunctiva/corneas clear, EOM's intact       Lungs:     Clear to auscultation bilaterally, respirations unlabored  Heart:    Regular rate and rhythm  Neurologic:   Awake, alert, oriented x 3. No apparent focal neurological           defect.      UC Treatments / Results  Labs (all labs ordered are listed, but only abnormal results are displayed) Labs Reviewed - No data to display  EKG   Radiology No results found.  Procedures Procedures (including critical care time)  Medications Ordered in UC Medications - No data to display  Initial Impression / Assessment and Plan / UC Course  I have reviewed the  triage vital signs and the nursing notes.  Pertinent labs & imaging results that were available during my  care of the patient were reviewed by me and considered in my medical decision making (see chart for details).    Acute URI with cough symptom management warranted only. Treatment per discharge medication orders. Work note provided. Return precaution given if symptoms worsen or do not improve. Final Clinical Impressions(s) / UC Diagnoses   Final diagnoses:  Upper respiratory tract infection, unspecified type  Cough   Discharge Instructions   None    ED Prescriptions    Medication Sig Dispense Auth. Provider   ipratropium (ATROVENT) 0.03 % nasal spray Place 2 sprays into both nostrils 2 (two) times daily. 30 mL Scot Jun, FNP   promethazine-phenylephrine (PROMETHAZINE-PHENYLEPHRINE) 6.25-5 MG/5ML SYRP  (Status: Discontinued) Take 5 mLs by mouth every 4 (four) hours as needed for congestion. 180 mL Scot Jun, FNP   levocetirizine (XYZAL) 5 MG tablet Take 1 tablet (5 mg total) by mouth every evening. 90 tablet Scot Jun, FNP   benzonatate (TESSALON) 100 MG capsule Take 1-2 capsules (100-200 mg total) by mouth 3 (three) times daily as needed for cough. 40 capsule Scot Jun, FNP   promethazine-dextromethorphan (PROMETHAZINE-DM) 6.25-15 MG/5ML syrup Take 5 mLs by mouth 4 (four) times daily as needed for cough. 140 mL Scot Jun, FNP     PDMP not reviewed this encounter.   Scot Jun, FNP 03/10/21 1135

## 2021-03-17 ENCOUNTER — Other Ambulatory Visit: Payer: Self-pay

## 2021-03-17 ENCOUNTER — Encounter: Payer: Self-pay | Admitting: Internal Medicine

## 2021-03-17 ENCOUNTER — Ambulatory Visit: Payer: BC Managed Care – PPO | Admitting: Internal Medicine

## 2021-03-17 ENCOUNTER — Telehealth (INDEPENDENT_AMBULATORY_CARE_PROVIDER_SITE_OTHER): Payer: BC Managed Care – PPO | Admitting: Internal Medicine

## 2021-03-17 DIAGNOSIS — I10 Essential (primary) hypertension: Secondary | ICD-10-CM | POA: Diagnosis not present

## 2021-03-17 DIAGNOSIS — G629 Polyneuropathy, unspecified: Secondary | ICD-10-CM | POA: Diagnosis not present

## 2021-03-17 DIAGNOSIS — E1165 Type 2 diabetes mellitus with hyperglycemia: Secondary | ICD-10-CM

## 2021-03-17 DIAGNOSIS — E7800 Pure hypercholesterolemia, unspecified: Secondary | ICD-10-CM | POA: Diagnosis not present

## 2021-03-17 DIAGNOSIS — Z7689 Persons encountering health services in other specified circumstances: Secondary | ICD-10-CM

## 2021-03-17 MED ORDER — GABAPENTIN 300 MG PO CAPS: 300.0000 mg | ORAL_CAPSULE | Freq: Every day | ORAL | 1 refills | Status: DC

## 2021-03-17 MED ORDER — AMLODIPINE BESYLATE 10 MG PO TABS: 10.0000 mg | ORAL_TABLET | Freq: Every day | ORAL | 3 refills | Status: DC

## 2021-03-17 MED ORDER — METFORMIN HCL 1000 MG PO TABS: ORAL_TABLET | ORAL | 1 refills | Status: DC

## 2021-03-17 MED ORDER — ATORVASTATIN CALCIUM 40 MG PO TABS: 40.0000 mg | ORAL_TABLET | Freq: Every morning | ORAL | 3 refills | Status: DC

## 2021-03-17 MED ORDER — DAPAGLIFLOZIN PROPANEDIOL 10 MG PO TABS: 10.0000 mg | ORAL_TABLET | Freq: Every day | ORAL | 1 refills | Status: DC

## 2021-03-17 MED ORDER — LISINOPRIL 10 MG PO TABS: ORAL_TABLET | ORAL | 1 refills | Status: DC

## 2021-03-17 MED ORDER — RYBELSUS 14 MG PO TABS: 14.0000 mg | ORAL_TABLET | Freq: Every day | ORAL | 1 refills | Status: DC

## 2021-03-17 NOTE — Progress Notes (Signed)
Virtual Visit via Telephone Note  I connected with Gabrielle [name], on 03/17/2021 at 2:40 PM by telephone due to the COVID-19 pandemic and verified that I am speaking with the correct person using two identifiers.   Consent: I discussed the limitations, risks, security and privacy concerns of performing an evaluation and management service by telephone and the availability of in person appointments. I also discussed with the patient that there may be a patient responsible charge related to this service. The patient expressed understanding and agreed to proceed.   Location of Patient: Home   Location of Provider: Clinic    Persons participating in Telemedicine visit: CYANA SHOOK [facility]  Dr. Juleen China   History of Present Illness: Patient has a visit to establish care. Patient was previously established with [facility]; however they have since closed.   PMH of HTN, HLD, T2DM. She reports compliance with her medications. She asks for refills of all of her medications. She has not been checking her blood sugars at all recently---reports that buying the test strips is so expensive. Has not been to eye doctor yet this year but plans on going.    When she goes to bed at night, she experiences pins sensation in bottom of the feet and like her legs are going to sleep.    Past Medical History:  Diagnosis Date  . Cancer ([place]) 2010   rectal CA  . Cataract   . Diabetes mellitus without complication ([place])    Phreesia 11/15/2020  . Eczema   . Hyperlipidemia   . Hypertension   . Intrinsic (urethral) sphincter deficiency (ISD)   . RBBB   . SUI (stress urinary incontinence, female)    hx of  . Type 2 diabetes mellitus ([place])   . Wears dentures    UPPER  . Wears glasses    No Known Allergies  Current Outpatient Medications on File Prior to Visit  Medication Sig Dispense Refill  . amLODipine (NORVASC) 10 MG tablet Take 1 tablet (10 mg total) by mouth daily. 90 tablet 3  .  aspirin 81 MG tablet Take 81 mg by mouth daily.    Marland Kitchen atorvastatin (LIPITOR) 40 MG tablet Take 1 tablet (40 mg total) by mouth every morning. 90 tablet 3  . benzonatate (TESSALON) 100 MG capsule Take 1-2 capsules (100-200 mg total) by mouth 3 (three) times daily as needed for cough. 40 capsule 0  . dapagliflozin propanediol (FARXIGA) 10 MG TABS tablet Take 1 tablet (10 mg total) by mouth daily. 90 tablet 1  . glucose blood (ACCU-CHEK SMARTVIEW) test strip PATIENT NEEDS OFFICE VISIT FOR ADDITIONAL REFILLS - very overdue for DM check up. 100 each 0  . ipratropium (ATROVENT) 0.03 % nasal spray Place 2 sprays into both nostrils 2 (two) times daily. 30 mL 0  . levocetirizine (XYZAL) 5 MG tablet Take 1 tablet (5 mg total) by mouth every evening. 90 tablet 0  . lisinopril (ZESTRIL) 10 MG tablet TAKE 1 TABLET BY MOUTH ONCE DAILY IN THE MORNING 90 tablet 1  . metFORMIN (GLUCOPHAGE) 1000 MG tablet TAKE 1 TABLET BY MOUTH TWICE DAILY WITH A MEAL 180 tablet 1  . promethazine-dextromethorphan (PROMETHAZINE-DM) 6.25-15 MG/5ML syrup Take 5 mLs by mouth 4 (four) times daily as needed for cough. 140 mL 0  . Semaglutide (RYBELSUS) 14 MG TABS Take 14 mg by mouth daily. 90 tablet 1   Current Facility-Administered Medications on File Prior to Visit  Medication Dose Route Frequency Provider Last Rate Last Admin  .  0.9 %  sodium chloride infusion  500 mL Intravenous Once Armbruster, Carlota Raspberry, MD        Observations/Objective: NAD. Speaking clearly.  Work of breathing normal.  Alert and oriented. Mood appropriate.   Assessment and Plan: 1. Encounter to establish care Reviewed patient's PMH, social history, surgical history, and medications.   2. Type 2 diabetes mellitus with hyperglycemia, without long-term current use of insulin (HCC) Last A1c poorly controlled with result of 9.9 in Dec 2021. Will monitor at upcoming visit. Continue current regimen.  - metFORMIN (GLUCOPHAGE) 1000 MG tablet; TAKE 1 TABLET BY MOUTH  TWICE DAILY WITH A MEAL  Dispense: 180 tablet; Refill: 1 - Semaglutide (RYBELSUS) 14 MG TABS; Take 14 mg by mouth daily.  Dispense: 90 tablet; Refill: 1 - dapagliflozin propanediol (FARXIGA) 10 MG TABS tablet; Take 1 tablet (10 mg total) by mouth daily.  Dispense: 90 tablet; Refill: 1  3. Essential hypertension - amLODipine (NORVASC) 10 MG tablet; Take 1 tablet (10 mg total) by mouth daily.  Dispense: 90 tablet; Refill: 3 - lisinopril (ZESTRIL) 10 MG tablet; TAKE 1 TABLET BY MOUTH ONCE DAILY IN THE MORNING  Dispense: 90 tablet; Refill: 1  4. Pure hypercholesterolemia - atorvastatin (LIPITOR) 40 MG tablet; Take 1 tablet (40 mg total) by mouth every morning.  Dispense: 90 tablet; Refill: 3  5. Neuropathy Symptoms sound consistent with neuropathy, likely related to DM. Trial Gabapentin at nighttime.  - gabapentin (NEURONTIN) 300 MG capsule; Take 1 capsule (300 mg total) by mouth at bedtime.  Dispense: 90 capsule; Refill: 1   Follow Up Instructions: Annual exam    I discussed the assessment and treatment plan with the patient. The patient was provided an opportunity to ask questions and all were answered. The patient agreed with the plan and demonstrated an understanding of the instructions.   The patient was advised to call back or seek an in-person evaluation if the symptoms worsen or if the condition fails to improve as anticipated.     I provided 12 minutes total of non-face-to-face time during this encounter including median intraservice time, reviewing previous notes, investigations, ordering medications, medical decision making, coordinating care and patient verbalized understanding at the end of the visit.    Phill Myron, D.O. Primary Care at [facility]  03/17/2021, 2:40 PM

## 2021-03-22 ENCOUNTER — Ambulatory Visit (INDEPENDENT_AMBULATORY_CARE_PROVIDER_SITE_OTHER): Payer: BC Managed Care – PPO | Admitting: Internal Medicine

## 2021-03-22 ENCOUNTER — Other Ambulatory Visit: Payer: Self-pay

## 2021-03-22 ENCOUNTER — Encounter: Payer: Self-pay | Admitting: Internal Medicine

## 2021-03-22 VITALS — BP 106/78 | HR 82 | Temp 97.3°F | Resp 16 | Ht <= 58 in | Wt 118.0 lb

## 2021-03-22 DIAGNOSIS — E1165 Type 2 diabetes mellitus with hyperglycemia: Secondary | ICD-10-CM | POA: Diagnosis not present

## 2021-03-22 DIAGNOSIS — Z0000 Encounter for general adult medical examination without abnormal findings: Secondary | ICD-10-CM | POA: Diagnosis not present

## 2021-03-22 DIAGNOSIS — Z114 Encounter for screening for human immunodeficiency virus [HIV]: Secondary | ICD-10-CM

## 2021-03-22 DIAGNOSIS — E7800 Pure hypercholesterolemia, unspecified: Secondary | ICD-10-CM | POA: Diagnosis not present

## 2021-03-22 DIAGNOSIS — Z130 Encounter for screening for diseases of the blood and blood-forming organs and certain disorders involving the immune mechanism: Secondary | ICD-10-CM

## 2021-03-22 LAB — POCT GLYCOSYLATED HEMOGLOBIN (HGB A1C): HbA1c, POC (controlled diabetic range): 8 % — AB (ref 0.0–7.0)

## 2021-03-22 NOTE — Progress Notes (Signed)
Subjective:    Gabrielle [name] - 65 y.o. female MRN [id number]  Date of birth: 1956/09/18  HPI  Gabrielle [name] is here for annual exam.  Diabetes mellitus, Type 2 Disease Monitoring             Blood Sugar Ranges: Not monitoring              Polyuria: no              Visual problems: no   Urine Microalbumin 33 (Dec 2020) ---on Lisinopril   Last A1C: 9.9 (Dec 2021)   Medications: Farxiga 10 mg, Metformin 1000 mg BID, Semaglutide 14 mg  Medication Compliance: yes  Medication Side Effects             Hypoglycemia: no        Health Maintenance:  Health Maintenance Due  Topic Date Due  . HIV Screening  Never done  . TETANUS/TDAP  Never done  . OPHTHALMOLOGY EXAM  01/23/2021    -  reports that she quit smoking about 14 years ago. Her smoking use included cigarettes. She quit after 20.00 years of use. She has never used smokeless tobacco. - Review of Systems: Per HPI. - Past Medical History: Patient Active Problem List   Diagnosis Date Noted  . History of colonic polyps 11/04/2019  . Uncontrolled type 2 diabetes mellitus with hyperglycemia ([place]) 03/09/2018  . Carcinoid tumor of rectum   . Essential hypertension 02/02/2015  . Atherosclerosis of native arteries of extremity with intermittent claudication ([place]) 02/02/2015  . Stress incontinence in female 03/06/2014  . 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 Diabetes mellitus ([place]) 02/28/2012  . Pure hypercholesterolemia 02/28/2012   - Medications: reviewed and updated   Objective:   Physical Exam BP 106/78 (BP Location: Right Arm, Patient Position: Sitting, Cuff Size: Normal)   Pulse 82   Temp (!) 97.3 F (36.3 C)   Resp 16   Ht 4' 9.5" (1.461 m)   Wt 118 lb (53.5 kg)   SpO2 97%   BMI 25.09 kg/m  Physical Exam Constitutional:      Appearance: She is not diaphoretic.  HENT:     Head: Normocephalic and atraumatic.  Eyes:     Conjunctiva/sclera: Conjunctivae normal.     Pupils: Pupils are equal,  round, and reactive to light.  Neck:     Thyroid: No thyromegaly.  Cardiovascular:     Rate and Rhythm: Normal rate and regular rhythm.     Heart sounds: Normal heart sounds. No murmur heard.   Pulmonary:     Effort: Pulmonary effort is normal. No respiratory distress.     Breath sounds: Normal breath sounds. No wheezing.  Abdominal:     General: Bowel sounds are normal. There is no distension.     Palpations: Abdomen is soft.     Tenderness: There is no abdominal tenderness. There is no guarding or rebound.  Musculoskeletal:        General: No deformity. Normal range of motion.     Cervical back: Normal range of motion and neck supple.  Lymphadenopathy:     Cervical: No cervical adenopathy.  Skin:    General: Skin is warm and dry.     Findings: No rash.  Neurological:     Mental Status: She is alert and oriented to person, place, and time.     Gait: Gait is intact.  Psychiatric:        Mood and Affect: Mood and affect normal.  Judgment: Judgment normal.            Assessment & Plan:   1. Encounter for annual physical exam Counseled on 150 minutes of exercise per week, healthy eating (including decreased daily intake of saturated fats, cholesterol, added sugars, sodium), STI prevention, routine healthcare maintenance.  2. Type 2 diabetes mellitus with hyperglycemia, without long-term current use of insulin (HCC) A1c has improved from 9.9 to 8. Much closer to goal especially given age. Continue current regimen. Emphasized diet and exercise importance for management of glucose. Has an upcoming appointment with ophthalmology.  - HgB A1c - Comprehensive metabolic panel  3. Pure hypercholesterolemia Continue Lipitor.  - Lipid panel - Comprehensive metabolic panel  4. Screening for deficiency anemia - CBC  5. Screening for HIV (human immunodeficiency virus) - HIV Antibody (routine testing w rflx)     Phill Myron, D.O. 03/22/2021, 9:05 AM Primary Care  at [facility]

## 2021-03-23 LAB — COMPREHENSIVE METABOLIC PANEL
ALT: 15 IU/L (ref 0–32)
AST: 19 IU/L (ref 0–40)
Albumin/Globulin Ratio: 1.2 (ref 1.2–2.2)
Albumin: 4.4 g/dL (ref 3.8–4.8)
Alkaline Phosphatase: 112 IU/L (ref 44–121)
BUN/Creatinine Ratio: 13 (ref 12–28)
BUN: 11 mg/dL (ref 8–27)
Bilirubin Total: 0.6 mg/dL (ref 0.0–1.2)
CO2: 21 mmol/L (ref 20–29)
Calcium: 9.8 mg/dL (ref 8.7–10.3)
Chloride: 95 mmol/L — ABNORMAL LOW (ref 96–106)
Creatinine, Ser: 0.83 mg/dL (ref 0.57–1.00)
Globulin, Total: 3.7 g/dL (ref 1.5–4.5)
Glucose: 119 mg/dL — ABNORMAL HIGH (ref 65–99)
Potassium: 4.4 mmol/L (ref 3.5–5.2)
Sodium: 137 mmol/L (ref 134–144)
Total Protein: 8.1 g/dL (ref 6.0–8.5)
eGFR: 79 mL/min/{1.73_m2} (ref >59)

## 2021-03-23 LAB — CBC
Hematocrit: 43.9 % (ref 34.0–46.6)
Hemoglobin: 14.5 g/dL (ref 11.1–15.9)
MCH: 29.8 pg (ref 26.6–33.0)
MCHC: 33 g/dL (ref 31.5–35.7)
MCV: 90 fL (ref 79–97)
Platelets: 261 10*3/uL (ref 150–450)
RBC: 4.87 x10E6/uL (ref 3.77–5.28)
RDW: 12.5 % (ref 11.7–15.4)
WBC: 6.1 10*3/uL (ref 3.4–10.8)

## 2021-03-23 LAB — LIPID PANEL
Chol/HDL Ratio: 5.9 ratio — ABNORMAL HIGH (ref 0.0–4.4)
Cholesterol, Total: 170 mg/dL (ref 100–199)
HDL: 29 mg/dL — ABNORMAL LOW (ref >39)
LDL Chol Calc (NIH): 117 mg/dL — ABNORMAL HIGH (ref 0–99)
Triglycerides: 134 mg/dL (ref 0–149)
VLDL Cholesterol Cal: 24 mg/dL (ref 5–40)

## 2021-03-23 LAB — HIV ANTIBODY (ROUTINE TESTING W REFLEX): HIV Screen 4th Generation wRfx: NONREACTIVE

## 2021-03-26 ENCOUNTER — Ambulatory Visit: Payer: BC Managed Care – PPO | Admitting: Internal Medicine

## 2021-08-15 NOTE — Progress Notes (Signed)
Patient ID: Gabrielle [name], female    DOB: 07-19-1956  MRN: [id number]  CC: Diabetes Follow-Up  Subjective: Gabrielle [name] is a 65 y.o. female who presents for diabetes follow-up.   Her concerns today include:   DIABETES TYPE 2 FOLLOW-UP: 03/17/2021 per DO note: Last A1c poorly controlled with result of 9.9 in Dec 2021. Will monitor at upcoming visit. Continue current regimen.   08/20/2021: Med Adherence:  [x]  Yes    []  No Medication side effects:  []  Yes    [x]  No Home Monitoring?  []  Yes    [x]  No Home glucose results range: none Diet Adherence: trying Exercise: []  Yes    [x]  No. Reports bilateral lower extremity pain when walking for a few weeks. Reports unable to walk frequent or long distances at her job. Reports scheduled to work a large event at the end of this month. Requesting work letter for limited walking. Denies red flag symptoms. Denies any recent trauma or injury. Denies referral to Orthopedics and/or Physical Therapy.   2. HYPERTENSION FOLLOW-UP: 03/17/2021 per DO note: Amlodipine   08/20/2021: Have you taken your blood pressure medication today: []  Yes [x]  No, reports she did not take thinking today may be a physical exam  Med Adherence: [x]  Yes    []  No Medication side effects: []  Yes    [x]  No SOB? []  Yes    [x]  No Chest Pain?: []  Yes    [x]  No  3. TOENAIL CONCERN: Reports some times ago bilateral great toenails fell off related to fungus. Reports has been unable to see a doctor for this concern. Reports remaining toenails do not have fungus. Concerned if she may need treatment. She has bilateral toenails today in office in Ziploc bag. Denies foot pain.  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     atorvastatin (LIPITOR) 40 MG tablet Take 1 tablet (40 mg total) by mouth every morning. 90 tablet 3   glucose blood (ACCU-CHEK SMARTVIEW) test strip PATIENT NEEDS OFFICE VISIT FOR ADDITIONAL REFILLS - very overdue for DM check up. 100 each 0   No current facility-administered medications on file prior to visit.    No Known Allergies  Social History   Socioeconomic History   Marital status: Married    Spouse name: Not on file   Number of children: 1   Years of education: Not on file   Highest education level: Not on file  Occupational History   Not on file  Tobacco Use   Smoking status: Former    Years: 20.00    Types: Cigarettes    Quit date: 11/14/2006    Years since quitting: 14.7   Smokeless tobacco: Never  Vaping Use   Vaping Use: Never used  Substance and Sexual Activity   Alcohol use: No   Drug use: No   Sexual activity: Not on file  Other Topics Concern   Not on  file  Social History Narrative   Not on file   Social Determinants of Health   Financial Resource Strain: Not on file  Food Insecurity: Not on file  Transportation Needs: Not on file  Physical Activity: Not on file  Stress: Not on file  Social Connections: Not on file  Intimate Partner Violence: Not on file    Family History  Problem Relation Age of Onset   Diabetes Mother    Hypertension Mother    Diabetes Sister    Hypertension Sister    Diabetes Brother    Hypertension Brother    Lupus Daughter    Colon cancer Neg Hx    Esophageal cancer Neg Hx    Rectal cancer Neg Hx    Stomach cancer Neg Hx    Colon polyps Neg Hx     Past Surgical History:   Procedure Laterality Date   ABDOMINAL HYSTERECTOMY  1998   fibroids, partial   ABDOMINOPLASTY  1999   BLADDER SUSPENSION N/A 03/06/2014   Procedure: [facility] ;  Surgeon: Irine Seal, MD;  Location: [facility];  Service: Urology;  Laterality: N/A;   CARDIOVASCULAR STRESS TEST  03-27-2008   NORMAL PERFUSION STUDY/  EF 54%   COLON SURGERY  2010   for rectal ca   COLONOSCOPY     EUS N/A 03/02/2017   Procedure: LOWER ENDOSCOPIC ULTRASOUND (EUS);  Surgeon: Milus Banister, MD;  Location: [facility];  Service: Endoscopy;  Laterality: N/A;   EXCISION LOWER ABDOMINAL SCAR POST ABDOMINOPLASTY  01-05-2000   ORIF RIGHT ANKLE FX  12-24-2008   RIGHT ANKLE ARTHROSCOPY W/ DEBRIDEMENT AND OPEN REMOVAL HARDWARE  03-12-2010   TRANSTHORACIC ECHOCARDIOGRAM  08-05-2009   MILD LVH/  EF 60-65%   TUBAL LIGATION      ROS: Review of Systems Negative except as stated above  PHYSICAL EXAM: BP (!) 148/80 (BP Location: Right Arm, Patient Position: Sitting, Cuff Size: Normal)   Pulse 83   Temp 98.7 F (37.1 C) (Oral)   Ht 4' 9.5" (1.461 m)   Wt 115 lb 6.4 oz (52.3 kg)   SpO2 95%   BMI 24.54 kg/m   Physical Exam HENT:     Head: Normocephalic and atraumatic.  Eyes:     Extraocular Movements: Extraocular movements intact.     Conjunctiva/sclera: Conjunctivae normal.     Pupils: Pupils are equal, round, and reactive to light.  Cardiovascular:     Rate and Rhythm: Normal rate and regular rhythm.     Pulses: Normal pulses.     Heart sounds: Normal heart sounds.  Pulmonary:     Effort: Pulmonary effort is normal.     Breath sounds: Normal breath sounds.  Musculoskeletal:        General: Normal range of motion.     Cervical back: Normal range of motion and neck supple.  Neurological:     General: No focal deficit present.     Mental Status: She is alert and oriented to person, place, and time.  Psychiatric:        Mood and Affect: Mood normal.        Behavior: Behavior normal.    Results for orders placed or performed in visit on 08/20/21  POCT glycosylated hemoglobin (Hb A1C)  Result Value Ref Range   Hemoglobin A1C 9.9 (A) 4.0 - 5.6 %   HbA1c POC (<> result, manual entry)     HbA1c, POC (prediabetic range)     HbA1c, POC (controlled diabetic  range)      ASSESSMENT AND PLAN: 1. Type 2 diabetes mellitus with hyperglycemia, without long-term current use of insulin ([place]): - Hemoglobin A1c today not at goal at 9.9%, goal < 7%. This is increased from previous hemoglobin A1c of 8.0% on 03/22/2021. - Continue Metformin, Dapagliflozin Propanediol, and Semaglutide as prescribed.  - Begin Sitagliptin as prescribed.  - Discussed the importance of healthy eating habits, low-carbohydrate diet, low-sugar diet, regular aerobic exercise (at least 150 minutes a week as tolerated) and medication compliance to achieve or maintain control of diabetes. - Follow-up with primary provider in 4 weeks or sooner if needed. - POCT glycosylated hemoglobin (Hb A1C) - metFORMIN (GLUCOPHAGE) 1000 MG tablet; TAKE 1 TABLET BY MOUTH TWICE DAILY WITH A MEAL  Dispense: 180 tablet; Refill: 0 - dapagliflozin propanediol (FARXIGA) 10 MG TABS tablet; Take 1 tablet (10 mg total) by mouth daily.  Dispense: 90 tablet; Refill: 0 - Semaglutide (RYBELSUS) 14 MG TABS; Take 14 mg by mouth daily.  Dispense: 90 tablet; Refill: 0 - sitaGLIPtin (JANUVIA) 25 MG tablet; Take 1 tablet (25 mg total) by mouth daily.  Dispense: 60 tablet; Refill: 0  2. Neuropathy: - Continue Gabapentin as prescribed.  - Follow-up with primary provider as scheduled.  - gabapentin (NEURONTIN) 300 MG capsule; Take 1 capsule (300 mg total) by mouth at bedtime.  Dispense: 90 capsule; Refill: 0  3. Essential hypertension: - Blood pressure not at goal during today's visit. Patient asymptomatic without chest pressure, chest pain, palpitations, shortness of breath, worst headache of life, and any additional red flag symptoms. - Patient reports  she did not take blood pressure medications this morning because she is fasting.  - Continue Amlodipine and Lisinopril as prescribed.  - Counseled on blood pressure goal of less than 130/80, low-sodium, DASH diet, medication compliance, 150 minutes of moderate intensity exercise per week as tolerated. Discussed medication compliance, adverse effects. - Follow-up with primary provider in 2 weeks or sooner if needed for blood pressure check.  - amLODipine (NORVASC) 10 MG tablet; Take 1 tablet (10 mg total) by mouth daily.  Dispense: 90 tablet; Refill: 0 - lisinopril (ZESTRIL) 10 MG tablet; TAKE 1 TABLET BY MOUTH ONCE DAILY IN THE MORNING  Dispense: 90 tablet; Refill: 0  4. Bilateral leg pain: - No red flag symptoms present. Discussed these possible symptoms with patient and to seek immediate evaluation at the Emergency Department if occurs. Patient verbalized understanding. - Patient denies any recent trauma or injury.  - Patient declined referral to Orthopedics.  - Patient declined referral to Physical Therapy.  - Patient provided with work letter as requested.  - Follow-up with primary provider as scheduled.   5. Toenail fungus: - Referral to Podiatry for further evaluation and management.  - Ambulatory referral to Podiatry   Patient was given the opportunity to ask questions.  Patient verbalized understanding of the plan and was able to repeat key elements of the plan. Patient was given clear instructions to go to Emergency Department or return to medical center if symptoms don't improve, worsen, or new problems develop.The patient verbalized understanding.   Orders Placed This Encounter  Procedures   Ambulatory referral to Podiatry   POCT glycosylated hemoglobin (Hb A1C)    Requested Prescriptions   Signed Prescriptions Disp Refills   metFORMIN (GLUCOPHAGE) 1000 MG tablet 180 tablet 0    Sig: TAKE 1 TABLET BY MOUTH TWICE DAILY WITH A MEAL   dapagliflozin propanediol (FARXIGA) 10  MG TABS tablet 90 tablet 0  Sig: Take 1 tablet (10 mg total) by mouth daily.   Semaglutide (RYBELSUS) 14 MG TABS 90 tablet 0    Sig: Take 14 mg by mouth daily.   gabapentin (NEURONTIN) 300 MG capsule 90 capsule 0    Sig: Take 1 capsule (300 mg total) by mouth at bedtime.   amLODipine (NORVASC) 10 MG tablet 90 tablet 0    Sig: Take 1 tablet (10 mg total) by mouth daily.   lisinopril (ZESTRIL) 10 MG tablet 90 tablet 0    Sig: TAKE 1 TABLET BY MOUTH ONCE DAILY IN THE MORNING   sitaGLIPtin (JANUVIA) 25 MG tablet 60 tablet 0    Sig: Take 1 tablet (25 mg total) by mouth daily.    Return in about 4 weeks (around 09/17/2021) for Follow-Up or next available Dorna Mai, MD and physical per patient preference .  Camillia Herter, NP

## 2021-08-20 ENCOUNTER — Other Ambulatory Visit: Payer: Self-pay

## 2021-08-20 ENCOUNTER — Encounter: Payer: Self-pay | Admitting: Family

## 2021-08-20 ENCOUNTER — Ambulatory Visit (INDEPENDENT_AMBULATORY_CARE_PROVIDER_SITE_OTHER): Payer: BC Managed Care – PPO | Admitting: Family

## 2021-08-20 VITALS — BP 148/80 | HR 83 | Temp 98.7°F | Ht <= 58 in | Wt 115.4 lb

## 2021-08-20 DIAGNOSIS — G629 Polyneuropathy, unspecified: Secondary | ICD-10-CM

## 2021-08-20 DIAGNOSIS — M79604 Pain in right leg: Secondary | ICD-10-CM | POA: Diagnosis not present

## 2021-08-20 DIAGNOSIS — I10 Essential (primary) hypertension: Secondary | ICD-10-CM | POA: Diagnosis not present

## 2021-08-20 DIAGNOSIS — E1165 Type 2 diabetes mellitus with hyperglycemia: Secondary | ICD-10-CM | POA: Diagnosis not present

## 2021-08-20 DIAGNOSIS — M79605 Pain in left leg: Secondary | ICD-10-CM

## 2021-08-20 DIAGNOSIS — B351 Tinea unguium: Secondary | ICD-10-CM

## 2021-08-20 LAB — POCT GLYCOSYLATED HEMOGLOBIN (HGB A1C): Hemoglobin A1C: 9.9 % — AB (ref 4.0–5.6)

## 2021-08-20 MED ORDER — LISINOPRIL 10 MG PO TABS: ORAL_TABLET | ORAL | 0 refills | Status: DC

## 2021-08-20 MED ORDER — GABAPENTIN 300 MG PO CAPS: 300.0000 mg | ORAL_CAPSULE | Freq: Every day | ORAL | 0 refills | Status: DC

## 2021-08-20 MED ORDER — METFORMIN HCL 1000 MG PO TABS: ORAL_TABLET | ORAL | 0 refills | Status: DC

## 2021-08-20 MED ORDER — SITAGLIPTIN PHOSPHATE 25 MG PO TABS
25.0000 mg | ORAL_TABLET | Freq: Every day | ORAL | 0 refills | Status: DC
Start: 2021-08-20 — End: 2021-11-26

## 2021-08-20 MED ORDER — RYBELSUS 14 MG PO TABS: 14.0000 mg | ORAL_TABLET | Freq: Every day | ORAL | 0 refills | Status: DC

## 2021-08-20 MED ORDER — AMLODIPINE BESYLATE 10 MG PO TABS: 10.0000 mg | ORAL_TABLET | Freq: Every day | ORAL | 0 refills | Status: DC

## 2021-08-20 MED ORDER — DAPAGLIFLOZIN PROPANEDIOL 10 MG PO TABS: 10.0000 mg | ORAL_TABLET | Freq: Every day | ORAL | 0 refills | Status: DC

## 2021-08-20 NOTE — Progress Notes (Signed)
Has not taken any medications this AM

## 2021-08-30 ENCOUNTER — Ambulatory Visit: Payer: BC Managed Care – PPO | Admitting: Podiatry

## 2021-08-30 ENCOUNTER — Other Ambulatory Visit: Payer: Self-pay

## 2021-08-30 DIAGNOSIS — B351 Tinea unguium: Secondary | ICD-10-CM | POA: Diagnosis not present

## 2021-08-30 DIAGNOSIS — E1149 Type 2 diabetes mellitus with other diabetic neurological complication: Secondary | ICD-10-CM

## 2021-08-30 DIAGNOSIS — I739 Peripheral vascular disease, unspecified: Secondary | ICD-10-CM | POA: Diagnosis not present

## 2021-08-30 MED ORDER — CICLOPIROX 8 % EX SOLN: Freq: Every day | CUTANEOUS | 0 refills | Status: DC

## 2021-08-30 NOTE — Patient Instructions (Signed)
Diabetes Mellitus and Foot Care Foot care is an important part of your health, especially when you have diabetes. Diabetes may cause you to have problems because of poor blood flow (circulation) to your feet and legs, which can cause your skin to: Become thinner and drier. Break more easily. Heal more slowly. Peel and crack. You may also have nerve damage (neuropathy) in your legs and feet, causing decreased feeling in them. This means that you may not notice minor injuries to your feet that could lead to more serious problems. Noticing and addressing any potential problems early is the best way to prevent future foot problems. How to care for your feet Foot hygiene  Wash your feet daily with warm water and mild soap. Do not use hot water. Then, pat your feet and the areas between your toes until they are completely dry. Do not soak your feet as this can dry your skin. Trim your toenails straight across. Do not dig under them or around the cuticle. File the edges of your nails with an emery board or nail file. Apply a moisturizing lotion or petroleum jelly to the skin on your feet and to dry, brittle toenails. Use lotion that does not contain alcohol and is unscented. Do not apply lotion between your toes. Shoes and socks Wear clean socks or stockings every day. Make sure they are not too tight. Do not wear knee-high stockings since they may decrease blood flow to your legs. Wear shoes that fit properly and have enough cushioning. Always look in your shoes before you put them on to be sure there are no objects inside. To break in new shoes, wear them for just a few hours a day. This prevents injuries on your feet. Wounds, scrapes, corns, and calluses  Check your feet daily for blisters, cuts, bruises, sores, and redness. If you cannot see the bottom of your feet, use a mirror or ask someone for help. Do not cut corns or calluses or try to remove them with medicine. If you find a minor scrape,  cut, or break in the skin on your feet, keep it and the skin around it clean and dry. You may clean these areas with mild soap and water. Do not clean the area with peroxide, alcohol, or iodine. If you have a wound, scrape, corn, or callus on your foot, look at it several times a day to make sure it is healing and not infected. Check for: Redness, swelling, or pain. Fluid or blood. Warmth. Pus or a bad smell. General tips Do not cross your legs. This may decrease blood flow to your feet. Do not use heating pads or hot water bottles on your feet. They may burn your skin. If you have lost feeling in your feet or legs, you may not know this is happening until it is too late. Protect your feet from hot and cold by wearing shoes, such as at the beach or on hot pavement. Schedule a complete foot exam at least once a year (annually) or more often if you have foot problems. Report any cuts, sores, or bruises to your health care provider immediately. Where to find more information American Diabetes Association: www.diabetes.org Association of Diabetes Care & Education Specialists: www.diabeteseducator.org Contact a health care provider if: You have a medical condition that increases your risk of infection and you have any cuts, sores, or bruises on your feet. You have an injury that is not healing. You have redness on your legs or feet. You   feel burning or tingling in your legs or feet. You have pain or cramps in your legs and feet. Your legs or feet are numb. Your feet always feel cold. You have pain around any toenails. Get help right away if: You have a wound, scrape, corn, or callus on your foot and: You have pain, swelling, or redness that gets worse. You have fluid or blood coming from the wound, scrape, corn, or callus. Your wound, scrape, corn, or callus feels warm to the touch. You have pus or a bad smell coming from the wound, scrape, corn, or callus. You have a fever. You have a red  line going up your leg. Summary Check your feet every day for blisters, cuts, bruises, sores, and redness. Apply a moisturizing lotion or petroleum jelly to the skin on your feet and to dry, brittle toenails. Wear shoes that fit properly and have enough cushioning. If you have foot problems, report any cuts, sores, or bruises to your health care provider immediately. Schedule a complete foot exam at least once a year (annually) or more often if you have foot problems. This information is not intended to replace advice given to you by your health care provider. Make sure you discuss any questions you have with your health care provider. Document Revised: 05/21/2020 Document Reviewed: 05/21/2020 Elsevier Patient Education  [place]. Ciclopirox nail solution What is this medication? CICLOPIROX (sye kloe PEER ox) NAIL SOLUTION is an antifungal medicine. It used to treat fungal infections of the nails. This medicine may be used for other purposes; ask your health care provider or pharmacist if you have questions. COMMON BRAND NAME(S): Ciclodan, [id number], Penlac What should I tell my care team before I take this medication? They need to know if you have any of these conditions: diabetes mellitus history of seizures HIV infection immune system problems or organ transplant large areas of burned or damaged skin peripheral vascular disease or poor circulation taking corticosteroid medication (including steroid inhalers, cream, or lotion) an unusual or allergic reaction to ciclopirox, isopropyl alcohol, other medicines, foods, dyes, or preservatives pregnant or trying to get pregnant breast-feeding How should I use this medication? This medicine is for external use only. Follow the directions that come with this medicine exactly. Wash and dry your hands before use. Avoid contact with the eyes, mouth or nose. If you do get this medicine in your eyes, rinse out with plenty of cool tap water.  Contact your doctor or health care professional if eye irritation occurs. Use at regular intervals. Do not use your medicine more often than directed. Finish the full course prescribed by your doctor or health care professional even if you think you are better. Do not stop using except on your doctor's advice. Talk to your pediatrician regarding the use of this medicine in children. While this medicine may be prescribed for children as young as 12 years for selected conditions, precautions do apply. Overdosage: If you think you have taken too much of this medicine contact a poison control center or emergency room at once. NOTE: This medicine is only for you. Do not share this medicine with others. What if I miss a dose? If you miss a dose, use it as soon as you can. If it is almost time for your next dose, use only that dose. Do not use double or extra doses. What may interact with this medication? Interactions are not expected. Do not use any other skin products without telling your doctor or health  care professional. This list may not describe all possible interactions. Give your health care provider a list of all the medicines, herbs, non-prescription drugs, or dietary supplements you use. Also tell them if you smoke, drink alcohol, or use illegal drugs. Some items may interact with your medicine. What should I watch for while using this medication? Tell your doctor or health care professional if your symptoms get worse. Four to six months of treatment may be needed for the nail(s) to improve. Some people may not achieve a complete cure or clearing of the nails by this time. Tell your doctor or health care professional if you develop sores or blisters that do not heal properly. If your nail infection returns after stopping using this product, contact your doctor or health care professional. What side effects may I notice from receiving this medication? Side effects that you should report to your  doctor or health care professional as soon as possible: allergic reactions like skin rash, itching or hives, swelling of the face, lips, or tongue severe irritation, redness, burning, blistering, peeling, swelling, oozing Side effects that usually do not require medical attention (report to your doctor or health care professional if they continue or are bothersome): mild reddening of the skin nail discoloration temporary burning or mild stinging at the site of application This list may not describe all possible side effects. Call your doctor for medical advice about side effects. You may report side effects to FDA at 1-800-FDA-1088. Where should I keep my medication? Keep out of the reach of children. Store at room temperature between 15 and 30 degrees C (59 and 86 degrees F). Do not freeze. Protect from light by storing the bottle in the carton after every use. This medicine is flammable. Keep away from heat and flame. Throw away any unused medicine after the expiration date. NOTE: This sheet is a summary. It may not cover all possible information. If you have questions about this medicine, talk to your doctor, pharmacist, or health care provider.  2022 Elsevier/Gold Standard (2008-02-04 16:49:20)

## 2021-08-31 NOTE — Progress Notes (Signed)
Subjective:   Patient ID: Gabrielle [name], female   DOB: 65 y.o.   MRN: [id number]   HPI 65 year old female presents the office today with concerns of thick, discolored toenails, fungus.  She states that both big toenails have fallen off a couple months ago and she actually brought them into the office for me to look at today.  She denies any swelling or redness or any drainage the toenail sites.  She does get occasional discomfort.  She also describes a pins and needle sensation to her foot.  She is diabetic her last A1c was around 9.  She is currently on gabapentin for this as well.  No open lesions.  No other concerns.   Review of Systems  All other systems reviewed and are negative.  Past Medical History:  Diagnosis Date   Cancer ([place]) 2010   rectal CA   Cataract    Diabetes mellitus without complication ([place])    Phreesia 11/15/2020   Eczema    Hyperlipidemia    Hypertension    Intrinsic (urethral) sphincter deficiency (ISD)    RBBB    SUI (stress urinary incontinence, female)    hx of   Type 2 diabetes mellitus ([place])    Wears dentures    UPPER   Wears glasses     Past Surgical History:  Procedure Laterality Date   ABDOMINAL HYSTERECTOMY  1998   fibroids, partial   ABDOMINOPLASTY  1999   BLADDER SUSPENSION N/A 03/06/2014   Procedure: [facility] SLING ;  Surgeon: Irine Seal, MD;  Location: [facility];  Service: Urology;  Laterality: N/A;   CARDIOVASCULAR STRESS TEST  03-27-2008   NORMAL PERFUSION STUDY/  EF 54%   COLON SURGERY  2010   for rectal ca   COLONOSCOPY     EUS N/A 03/02/2017   Procedure: LOWER ENDOSCOPIC ULTRASOUND (EUS);  Surgeon: Milus Banister, MD;  Location: [facility];  Service: Endoscopy;  Laterality: N/A;   EXCISION LOWER ABDOMINAL SCAR POST ABDOMINOPLASTY  01-05-2000   ORIF RIGHT ANKLE FX  12-24-2008   RIGHT ANKLE ARTHROSCOPY W/ DEBRIDEMENT AND OPEN REMOVAL HARDWARE  03-12-2010   TRANSTHORACIC ECHOCARDIOGRAM  08-05-2009   MILD LVH/  EF  60-65%   TUBAL LIGATION       Current Outpatient Medications:    ciclopirox (PENLAC) 8 % solution, Apply topically at bedtime. Apply over nail and surrounding skin. Apply daily over previous coat. After seven (7) days, may remove with alcohol and continue cycle., Disp: 6.6 mL, Rfl: 0   amLODipine (NORVASC) 10 MG tablet, Take 1 tablet (10 mg total) by mouth daily., Disp: 90 tablet, Rfl: 0   aspirin 81 MG tablet, Take 81 mg by mouth daily., Disp: , Rfl:    atorvastatin (LIPITOR) 40 MG tablet, Take 1 tablet (40 mg total) by mouth every morning., Disp: 90 tablet, Rfl: 3   dapagliflozin propanediol (FARXIGA) 10 MG TABS tablet, Take 1 tablet (10 mg total) by mouth daily., Disp: 90 tablet, Rfl: 0   gabapentin (NEURONTIN) 300 MG capsule, Take 1 capsule (300 mg total) by mouth at bedtime., Disp: 90 capsule, Rfl: 0   glucose blood (ACCU-CHEK SMARTVIEW) test strip, PATIENT NEEDS OFFICE VISIT FOR ADDITIONAL REFILLS - very overdue for DM check up., Disp: 100 each, Rfl: 0   lisinopril (ZESTRIL) 10 MG tablet, TAKE 1 TABLET BY MOUTH ONCE DAILY IN THE MORNING, Disp: 90 tablet, Rfl: 0   metFORMIN (GLUCOPHAGE) 1000 MG tablet, TAKE 1 TABLET BY MOUTH TWICE DAILY  WITH A MEAL, Disp: 180 tablet, Rfl: 0   Semaglutide (RYBELSUS) 14 MG TABS, Take 14 mg by mouth daily., Disp: 90 tablet, Rfl: 0   sitaGLIPtin (JANUVIA) 25 MG tablet, Take 1 tablet (25 mg total) by mouth daily., Disp: 60 tablet, Rfl: 0  No Known Allergies        Objective:  Physical Exam  General: AAO x3, NAD  Dermatological: Nails appear to be hypertrophic, dystrophic with yellow-brown discoloration.  There is new nail growth present on the hallux toenails and the nails that she brought into the office were thickened and discolored with brown discoloration.  No edema, erythema to the toenail sites.  There is no open lesions.  Vascular: Dorsalis Pedis artery and Posterior Tibial artery pedal pulses are 1/4 bilateral with immedate capillary fill time.   There is no pain with calf compression, swelling, warmth, erythema.   Neruologic: Sensation mildly decreased with Semmes Weinstein monofilament  Musculoskeletal: No gross boney pedal deformities bilateral. No pain, crepitus, or limitation noted with foot and ankle range of motion bilateral. Muscular strength 5/5 in all groups tested bilateral.  Gait: Unassisted, Nonantalgic.       Assessment:   Onychomycosis     Plan:  -Treatment options discussed including all alternatives, risks, and complications -Etiology of symptoms were discussed -Nails debrided 10 without complications or bleeding as a courtesy.  Discussed treatment options for nail fungus.  We will start Penlac. -Order ABI given decreased pulses and history of uncontrolled diabetes. -Continue gabapentin for neuropathy -Daily foot inspection -Follow-up in 3 months or sooner if any problems arise. In the meantime, encouraged to call the office with any questions, concerns, change in symptoms.   Celesta Gentile, DPM

## 2021-09-02 ENCOUNTER — Other Ambulatory Visit: Payer: Self-pay

## 2021-09-02 ENCOUNTER — Ambulatory Visit (HOSPITAL_COMMUNITY)
Admission: RE | Admit: 2021-09-02 | Discharge: 2021-09-02 | Disposition: A | Discharge status: Home / Self Care | Payer: BC Managed Care – PPO | Source: Ambulatory Visit | Attending: Podiatry | Admitting: Podiatry

## 2021-09-02 DIAGNOSIS — I739 Peripheral vascular disease, unspecified: Secondary | ICD-10-CM | POA: Insufficient documentation

## 2021-09-08 ENCOUNTER — Other Ambulatory Visit: Payer: Self-pay | Admitting: Podiatry

## 2021-09-08 DIAGNOSIS — I739 Peripheral vascular disease, unspecified: Secondary | ICD-10-CM

## 2021-09-10 ENCOUNTER — Telehealth: Payer: Self-pay | Admitting: *Deleted

## 2021-09-10 NOTE — Telephone Encounter (Signed)
-----   Message from Trula Slade, DPM sent at 09/08/2021  6:22 PM EDT ----- Anola Gurney message  Lattie Haw- can you please follow up with her to make sure she got my message? Thanks.

## 2021-09-10 NOTE — Telephone Encounter (Signed)
Called and left a message for the patient and relayed the message per Dr Wagoner. Kiwan Gadsden 

## 2021-09-17 ENCOUNTER — Telehealth: Payer: Self-pay | Admitting: *Deleted

## 2021-09-17 NOTE — Telephone Encounter (Signed)
-----   Message from Trula Slade, DPM sent at 09/08/2021  6:22 PM EDT ----- Anola Gurney message  Lattie Haw- can you please follow up with her to make sure she got my message? Thanks.

## 2021-09-17 NOTE — Telephone Encounter (Signed)
I went to the cover my meds and I did the request for the prescription for Ciclopirox 8% solution and there was no eligibility found, please reconfirm the members health plan coverage and I called the patient today to confirm the patient's health plan. Gabrielle [name]

## 2021-09-17 NOTE — Telephone Encounter (Signed)
Called patient and stated that I was calling to see if the patient got the penlac prescription and patient stated that Dr Jacqualyn Posey told patient not to worry about the penlac for right now and to just get the ultrasound done. Lattie Haw

## 2021-09-24 ENCOUNTER — Other Ambulatory Visit: Payer: Self-pay

## 2021-09-24 ENCOUNTER — Encounter: Payer: Self-pay | Admitting: Family Medicine

## 2021-09-24 ENCOUNTER — Ambulatory Visit (INDEPENDENT_AMBULATORY_CARE_PROVIDER_SITE_OTHER): Payer: BC Managed Care – PPO | Admitting: Family Medicine

## 2021-09-24 VITALS — BP 121/79 | HR 87 | Temp 98.3°F | Resp 16 | Ht <= 58 in | Wt 118.0 lb

## 2021-09-24 DIAGNOSIS — E1165 Type 2 diabetes mellitus with hyperglycemia: Secondary | ICD-10-CM | POA: Diagnosis not present

## 2021-09-24 DIAGNOSIS — I10 Essential (primary) hypertension: Secondary | ICD-10-CM | POA: Diagnosis not present

## 2021-09-24 NOTE — Progress Notes (Signed)
Patient is here for CPE with no other concerns today

## 2021-09-24 NOTE — Progress Notes (Signed)
Established Patient Office Visit  Subjective:  Patient ID: Gabrielle [name], female    DOB: 11-06-1956  Age: 65 y.o. MRN: [id number]  CC:  Chief Complaint  Patient presents with   Annual Exam    HPI Gabrielle [name] presents for follow up of chronic med issues including diabetes and hypertension. Patient denies acute complaints or concerns.   Past Medical History:  Diagnosis Date   Cancer ([place]) 2010   rectal CA   Cataract    Diabetes mellitus without complication ([place])    Phreesia 11/15/2020   Eczema    Hyperlipidemia    Hypertension    Intrinsic (urethral) sphincter deficiency (ISD)    RBBB    SUI (stress urinary incontinence, female)    hx of   Type 2 diabetes mellitus (HCC)    Wears dentures    UPPER   Wears glasses       Social History   Socioeconomic History   Marital status: Married    Spouse name: Not on file   Number of children: 1   Years of education: Not on file   Highest education level: Not on file  Occupational History   Not on file  Tobacco Use   Smoking status: Former    Years: 20.00    Types: Cigarettes    Quit date: 11/14/2006    Years since quitting: 14.8   Smokeless tobacco: Never  Vaping Use   Vaping Use: Never used  Substance and Sexual Activity   Alcohol use: No   Drug use: No   Sexual activity: Not on file  Other Topics Concern   Not on file  Social History Narrative   Not on file   Social Determinants of Health   Financial Resource Strain: Not on file  Food Insecurity: Not on file  Transportation Needs: Not on file  Physical Activity: Not on file  Stress: Not on file  Social Connections: Not on file  Intimate Partner Violence: Not on file    ROS Review of Systems  All other systems reviewed and are negative.  Objective:   Today's Vitals: BP 121/79   Pulse 87   Temp 98.3 F (36.8 C) (Oral)   Resp 16   Ht 4\' 8"  (1.422 m)   Wt 118 lb (53.5 kg)   SpO2 96%   BMI 26.46 kg/m   Physical Exam Vitals and  nursing note reviewed.  Constitutional:      General: She is not in acute distress. Cardiovascular:     Rate and Rhythm: Normal rate and regular rhythm.  Pulmonary:     Effort: Pulmonary effort is normal.     Breath sounds: Normal breath sounds.  Abdominal:     Palpations: Abdomen is soft.     Tenderness: There is no abdominal tenderness.  Musculoskeletal:     Right lower leg: No edema.     Left lower leg: No edema.  Neurological:     General: No focal deficit present.     Mental Status: She is alert and oriented to person, place, and time.    Assessment & Plan:   1. Type 2 diabetes mellitus with hyperglycemia, without long-term current use of insulin (HCC) Recent A1c was elevated. Discussed compliance as well as dietary and activity options.   2. Essential hypertension Appears stable. Continue present management    Outpatient Encounter Medications as of 09/24/2021  Medication Sig   amLODipine (NORVASC) 10 MG tablet Take 1 tablet (10 mg total) by mouth  daily.   aspirin 81 MG tablet Take 81 mg by mouth daily.   atorvastatin (LIPITOR) 40 MG tablet Take 1 tablet (40 mg total) by mouth every morning.   ciclopirox (PENLAC) 8 % solution Apply topically at bedtime. Apply over nail and surrounding skin. Apply daily over previous coat. After seven (7) days, may remove with alcohol and continue cycle.   dapagliflozin propanediol (FARXIGA) 10 MG TABS tablet Take 1 tablet (10 mg total) by mouth daily.   gabapentin (NEURONTIN) 300 MG capsule Take 1 capsule (300 mg total) by mouth at bedtime.   glucose blood (ACCU-CHEK SMARTVIEW) test strip PATIENT NEEDS OFFICE VISIT FOR ADDITIONAL REFILLS - very overdue for DM check up.   lisinopril (ZESTRIL) 10 MG tablet TAKE 1 TABLET BY MOUTH ONCE DAILY IN THE MORNING   metFORMIN (GLUCOPHAGE) 1000 MG tablet TAKE 1 TABLET BY MOUTH TWICE DAILY WITH A MEAL   Semaglutide (RYBELSUS) 14 MG TABS Take 14 mg by mouth daily.   sitaGLIPtin (JANUVIA) 25 MG tablet  Take 1 tablet (25 mg total) by mouth daily.   [DISCONTINUED] amLODipine (NORVASC) 10 MG tablet Take 1 tablet (10 mg total) by mouth daily.   [DISCONTINUED] dapagliflozin propanediol (FARXIGA) 10 MG TABS tablet Take 1 tablet (10 mg total) by mouth daily.   [DISCONTINUED] gabapentin (NEURONTIN) 300 MG capsule Take 1 capsule (300 mg total) by mouth at bedtime.   [DISCONTINUED] lisinopril (ZESTRIL) 10 MG tablet TAKE 1 TABLET BY MOUTH ONCE DAILY IN THE MORNING   [DISCONTINUED] metFORMIN (GLUCOPHAGE) 1000 MG tablet TAKE 1 TABLET BY MOUTH TWICE DAILY WITH A MEAL   [DISCONTINUED] Semaglutide (RYBELSUS) 14 MG TABS Take 14 mg by mouth daily.   No facility-administered encounter medications on file as of 09/24/2021.    Follow-up: Return in about 2 months (around 11/24/2021).   Becky Sax, MD

## 2021-09-30 ENCOUNTER — Encounter: Payer: Self-pay | Admitting: Family Medicine

## 2021-10-01 ENCOUNTER — Other Ambulatory Visit: Payer: Self-pay | Admitting: Family Medicine

## 2021-10-01 DIAGNOSIS — Z1211 Encounter for screening for malignant neoplasm of colon: Secondary | ICD-10-CM

## 2021-10-18 LAB — COLOGUARD: COLOGUARD: NEGATIVE

## 2021-11-19 ENCOUNTER — Encounter: Payer: BC Managed Care – PPO | Admitting: Vascular Surgery

## 2021-11-26 ENCOUNTER — Encounter: Payer: Self-pay | Admitting: Family Medicine

## 2021-11-26 ENCOUNTER — Ambulatory Visit: Payer: BC Managed Care – PPO | Admitting: Family Medicine

## 2021-11-26 ENCOUNTER — Other Ambulatory Visit: Payer: Self-pay

## 2021-11-26 DIAGNOSIS — E7800 Pure hypercholesterolemia, unspecified: Secondary | ICD-10-CM | POA: Diagnosis not present

## 2021-11-26 DIAGNOSIS — G629 Polyneuropathy, unspecified: Secondary | ICD-10-CM

## 2021-11-26 DIAGNOSIS — E1165 Type 2 diabetes mellitus with hyperglycemia: Secondary | ICD-10-CM | POA: Diagnosis not present

## 2021-11-26 DIAGNOSIS — I10 Essential (primary) hypertension: Secondary | ICD-10-CM

## 2021-11-26 MED ORDER — ATORVASTATIN CALCIUM 40 MG PO TABS: 40.0000 mg | ORAL_TABLET | Freq: Every morning | ORAL | 1 refills | Status: DC

## 2021-11-26 MED ORDER — LISINOPRIL 10 MG PO TABS: ORAL_TABLET | ORAL | 1 refills | Status: DC

## 2021-11-26 MED ORDER — AMLODIPINE BESYLATE 10 MG PO TABS: 10.0000 mg | ORAL_TABLET | Freq: Every day | ORAL | 1 refills | Status: DC

## 2021-11-26 MED ORDER — SITAGLIPTIN PHOSPHATE 25 MG PO TABS: 25.0000 mg | ORAL_TABLET | Freq: Every day | ORAL | 1 refills | Status: DC

## 2021-11-26 MED ORDER — METFORMIN HCL 1000 MG PO TABS: ORAL_TABLET | ORAL | 1 refills | Status: DC

## 2021-11-26 MED ORDER — DAPAGLIFLOZIN PROPANEDIOL 10 MG PO TABS: 10.0000 mg | ORAL_TABLET | Freq: Every day | ORAL | 1 refills | Status: DC

## 2021-11-26 MED ORDER — GABAPENTIN 300 MG PO CAPS: 300.0000 mg | ORAL_CAPSULE | Freq: Every day | ORAL | 1 refills | Status: DC

## 2021-11-26 MED ORDER — RYBELSUS 14 MG PO TABS: 14.0000 mg | ORAL_TABLET | Freq: Every day | ORAL | 1 refills | Status: DC

## 2021-11-26 NOTE — Progress Notes (Signed)
Patient is her for 3 month follow-up DM, HTN  Patient has no other concerns today

## 2021-11-27 ENCOUNTER — Encounter: Payer: Self-pay | Admitting: Family Medicine

## 2021-11-27 NOTE — Progress Notes (Signed)
Established Patient Office Visit  Subjective:  Patient ID: Gabrielle [name], female    DOB: 04-25-1956  Age: 66 y.o. MRN: [id number]  CC:  Chief Complaint  Patient presents with   Follow-up   Diabetes    HPI GALILEA QUITO presents for routine follow up of chronic med issues including diabetes and hypertension. Patient denies acute complaints or concerns.   Past Medical History:  Diagnosis Date   Cancer ([place]) 2010   rectal CA   Cataract    Diabetes mellitus without complication ([place])    Phreesia 11/15/2020   Eczema    Hyperlipidemia    Hypertension    Intrinsic (urethral) sphincter deficiency (ISD)    RBBB    SUI (stress urinary incontinence, female)    hx of   Type 2 diabetes mellitus ([place])    Wears dentures    UPPER   Wears glasses     Past Surgical History:  Procedure Laterality Date   ABDOMINAL HYSTERECTOMY  1998   fibroids, partial   ABDOMINOPLASTY  1999   BLADDER SUSPENSION N/A 03/06/2014   Procedure: [facility] ;  Surgeon: Irine Seal, MD;  Location: [facility];  Service: Urology;  Laterality: N/A;   CARDIOVASCULAR STRESS TEST  03-27-2008   NORMAL PERFUSION STUDY/  EF 54%   COLON SURGERY  2010   for rectal ca   COLONOSCOPY     EUS N/A 03/02/2017   Procedure: LOWER ENDOSCOPIC ULTRASOUND (EUS);  Surgeon: Milus Banister, MD;  Location: [facility];  Service: Endoscopy;  Laterality: N/A;   EXCISION LOWER ABDOMINAL SCAR POST ABDOMINOPLASTY  01-05-2000   ORIF RIGHT ANKLE FX  12-24-2008   RIGHT ANKLE ARTHROSCOPY W/ DEBRIDEMENT AND OPEN REMOVAL HARDWARE  03-12-2010   TRANSTHORACIC ECHOCARDIOGRAM  08-05-2009   MILD LVH/  EF 60-65%   TUBAL LIGATION      Family History  Problem Relation Age of Onset   Diabetes Mother    Hypertension Mother    Diabetes Sister    Hypertension Sister    Diabetes Brother    Hypertension Brother    Lupus Daughter    Colon cancer Neg Hx    Esophageal cancer Neg Hx    Rectal cancer Neg Hx    Stomach cancer  Neg Hx    Colon polyps Neg Hx     Social History   Socioeconomic History   Marital status: Married    Spouse name: Not on file   Number of children: 1   Years of education: Not on file   Highest education level: Not on file  Occupational History   Not on file  Tobacco Use   Smoking status: Former    Years: 20.00    Types: Cigarettes    Quit date: 11/14/2006    Years since quitting: 15.0   Smokeless tobacco: Never  Vaping Use   Vaping Use: Never used  Substance and Sexual Activity   Alcohol use: No   Drug use: No   Sexual activity: Not on file  Other Topics Concern   Not on file  Social History Narrative   Not on file   Social Determinants of Health   Financial Resource Strain: Not on file  Food Insecurity: Not on file  Transportation Needs: Not on file  Physical Activity: Not on file  Stress: Not on file  Social Connections: Not on file  Intimate Partner Violence: Not on file    ROS Review of Systems  All other systems reviewed  and are negative.  Objective:   Today's Vitals: BP 122/80    Pulse 86    Temp 98.5 F (36.9 C) (Oral)    Resp 16    Wt 118 lb 9.6 oz (53.8 kg)    SpO2 96%    BMI 26.59 kg/m   Physical Exam Vitals and nursing note reviewed.  Constitutional:      General: She is not in acute distress. Cardiovascular:     Rate and Rhythm: Normal rate and regular rhythm.  Pulmonary:     Effort: Pulmonary effort is normal.     Breath sounds: Normal breath sounds.  Abdominal:     Palpations: Abdomen is soft.     Tenderness: There is no abdominal tenderness.  Musculoskeletal:     Right lower leg: No edema.     Left lower leg: No edema.  Neurological:     General: No focal deficit present.     Mental Status: She is alert and oriented to person, place, and time.    Assessment & Plan:   1. Essential hypertension Appears stable. Continue present management and monitor. Meds refilled.   - amLODipine (NORVASC) 10 MG tablet; Take 1 tablet (10 mg  total) by mouth daily.  Dispense: 90 tablet; Refill: 1 - lisinopril (ZESTRIL) 10 MG tablet; TAKE 1 TABLET BY MOUTH ONCE DAILY IN THE MORNING  Dispense: 90 tablet; Refill: 1  2. Pure hypercholesterolemia Continue present management. Meds refilled.  - atorvastatin (LIPITOR) 40 MG tablet; Take 1 tablet (40 mg total) by mouth every morning.  Dispense: 90 tablet; Refill: 1  3. Type 2 diabetes mellitus with hyperglycemia, without long-term current use of insulin (HCC) Much improved A1c but still above goal. Continue present management. Meds refilled.   - dapagliflozin propanediol (FARXIGA) 10 MG TABS tablet; Take 1 tablet (10 mg total) by mouth daily.  Dispense: 90 tablet; Refill: 1 - metFORMIN (GLUCOPHAGE) 1000 MG tablet; TAKE 1 TABLET BY MOUTH TWICE DAILY WITH A MEAL  Dispense: 180 tablet; Refill: 1 - Semaglutide (RYBELSUS) 14 MG TABS; Take 14 mg by mouth daily.  Dispense: 90 tablet; Refill: 1 - sitaGLIPtin (JANUVIA) 25 MG tablet; Take 1 tablet (25 mg total) by mouth daily.  Dispense: 90 tablet; Refill: 1  4. Neuropathy Continue present management. Meds refilled.   - gabapentin (NEURONTIN) 300 MG capsule; Take 1 capsule (300 mg total) by mouth at bedtime.  Dispense: 90 capsule; Refill: 1    Outpatient Encounter Medications as of 11/26/2021  Medication Sig   aspirin 81 MG tablet Take 81 mg by mouth daily.   ciclopirox (PENLAC) 8 % solution Apply topically at bedtime. Apply over nail and surrounding skin. Apply daily over previous coat. After seven (7) days, may remove with alcohol and continue cycle.   glucose blood (ACCU-CHEK SMARTVIEW) test strip PATIENT NEEDS OFFICE VISIT FOR ADDITIONAL REFILLS - very overdue for DM check up.   [DISCONTINUED] amLODipine (NORVASC) 10 MG tablet Take 1 tablet (10 mg total) by mouth daily.   [DISCONTINUED] atorvastatin (LIPITOR) 40 MG tablet Take 1 tablet (40 mg total) by mouth every morning.   [DISCONTINUED] dapagliflozin propanediol (FARXIGA) 10 MG TABS  tablet Take 1 tablet (10 mg total) by mouth daily.   [DISCONTINUED] gabapentin (NEURONTIN) 300 MG capsule Take 1 capsule (300 mg total) by mouth at bedtime.   [DISCONTINUED] lisinopril (ZESTRIL) 10 MG tablet TAKE 1 TABLET BY MOUTH ONCE DAILY IN THE MORNING   [DISCONTINUED] metFORMIN (GLUCOPHAGE) 1000 MG tablet TAKE 1 TABLET BY MOUTH  TWICE DAILY WITH A MEAL   [DISCONTINUED] Semaglutide (RYBELSUS) 14 MG TABS Take 14 mg by mouth daily.   amLODipine (NORVASC) 10 MG tablet Take 1 tablet (10 mg total) by mouth daily.   atorvastatin (LIPITOR) 40 MG tablet Take 1 tablet (40 mg total) by mouth every morning.   dapagliflozin propanediol (FARXIGA) 10 MG TABS tablet Take 1 tablet (10 mg total) by mouth daily.   gabapentin (NEURONTIN) 300 MG capsule Take 1 capsule (300 mg total) by mouth at bedtime.   lisinopril (ZESTRIL) 10 MG tablet TAKE 1 TABLET BY MOUTH ONCE DAILY IN THE MORNING   metFORMIN (GLUCOPHAGE) 1000 MG tablet TAKE 1 TABLET BY MOUTH TWICE DAILY WITH A MEAL   Semaglutide (RYBELSUS) 14 MG TABS Take 14 mg by mouth daily.   sitaGLIPtin (JANUVIA) 25 MG tablet Take 1 tablet (25 mg total) by mouth daily.   [DISCONTINUED] sitaGLIPtin (JANUVIA) 25 MG tablet Take 1 tablet (25 mg total) by mouth daily.   No facility-administered encounter medications on file as of 11/26/2021.    Follow-up: No follow-ups on file.   Becky Sax, MD

## 2021-11-30 ENCOUNTER — Other Ambulatory Visit: Payer: Self-pay

## 2021-11-30 ENCOUNTER — Ambulatory Visit: Payer: BC Managed Care – PPO | Admitting: Podiatry

## 2021-11-30 DIAGNOSIS — I739 Peripheral vascular disease, unspecified: Secondary | ICD-10-CM

## 2021-11-30 DIAGNOSIS — M79675 Pain in left toe(s): Secondary | ICD-10-CM

## 2021-11-30 DIAGNOSIS — M79674 Pain in right toe(s): Secondary | ICD-10-CM

## 2021-11-30 DIAGNOSIS — B351 Tinea unguium: Secondary | ICD-10-CM

## 2021-11-30 DIAGNOSIS — E1149 Type 2 diabetes mellitus with other diabetic neurological complication: Secondary | ICD-10-CM

## 2021-12-01 NOTE — Progress Notes (Signed)
Office Note     CC: Bilateral lower extremity claudication, right greater than left Requesting Provider:  Dorna Mai, MD  HPI: Gabrielle [name] is a 66 y.o. (1956/03/25) female presenting at the request of Dr. Jacqualyn Posey DPM, for bilateral lower extremity claudication right greater than left.  Gabrielle [name] works full-time at [employer] in [place].  Over the last several months she is appreciated significant claudication symptoms in bilateral lower extremities that is affected her time at work.  She has difficulty ambulating from her desk to the restroom.  At her home, she limits activity to forego pain in bilateral calves.  Gabrielle [name] also appreciates rest pain in the right foot when she sleeps, that improves when placing the foot in the dependent position.  She denies wounds or ulcerations.  She is a non-smoker.  The pt is  on a statin for cholesterol management.  The pt is  on a daily aspirin.   Other AC:  - The pt is  on medication for hypertension.   The pt is  diabetic.  Tobacco hx:  -  Past Medical History:  Diagnosis Date   Cancer ([place]) 2010   rectal CA   Cataract    Diabetes mellitus without complication ([place])    Phreesia 11/15/2020   Eczema    Hyperlipidemia    Hypertension    Intrinsic (urethral) sphincter deficiency (ISD)    RBBB    SUI (stress urinary incontinence, female)    hx of   Type 2 diabetes mellitus ([place])    Wears dentures    UPPER   Wears glasses     Past Surgical History:  Procedure Laterality Date   ABDOMINAL HYSTERECTOMY  1998   fibroids, partial   ABDOMINOPLASTY  1999   BLADDER SUSPENSION N/A 03/06/2014   Procedure: [facility] SL[facility] ;  Surgeon: Irine Seal, MD;  Location: [facility];  Service: Urology;  Laterality: N/A;   CARDIOVASCULAR STRESS TEST  03-27-2008   NORMAL PERFUSION STUDY/  EF 54%   COLON SURGERY  2010   for rectal ca   COLONOSCOPY     EUS N/A 03/02/2017   Procedure: LOWER ENDOSCOPIC ULTRASOUND (EUS);  Surgeon: Milus Banister, MD;   Location: [facility];  Service: Endoscopy;  Laterality: N/A;   EXCISION LOWER ABDOMINAL SCAR POST ABDOMINOPLASTY  01-05-2000   ORIF RIGHT ANKLE FX  12-24-2008   RIGHT ANKLE ARTHROSCOPY W/ DEBRIDEMENT AND OPEN REMOVAL HARDWARE  03-12-2010   TRANSTHORACIC ECHOCARDIOGRAM  08-05-2009   MILD LVH/  EF 60-65%   TUBAL LIGATION      Social History   Socioeconomic History   Marital status: Married    Spouse name: Not on file   Number of children: 1   Years of education: Not on file   Highest education level: Not on file  Occupational History   Not on file  Tobacco Use   Smoking status: Former    Years: 20.00    Types: Cigarettes    Quit date: 11/14/2006    Years since quitting: 15.0   Smokeless tobacco: Never  Vaping Use   Vaping Use: Never used  Substance and Sexual Activity   Alcohol use: No   Drug use: No   Sexual activity: Not on file  Other Topics Concern   Not on file  Social History Narrative   Not on file   Social Determinants of Health   Financial Resource Strain: Not on file  Food Insecurity: Not on file  Transportation Needs: Not on  file  Physical Activity: Not on file  Stress: Not on file  Social Connections: Not on file  Intimate Partner Violence: Not on file    Family History  Problem Relation Age of Onset   Diabetes Mother    Hypertension Mother    Diabetes Sister    Hypertension Sister    Diabetes Brother    Hypertension Brother    Lupus Daughter    Colon cancer Neg Hx    Esophageal cancer Neg Hx    Rectal cancer Neg Hx    Stomach cancer Neg Hx    Colon polyps Neg Hx     Current Outpatient Medications  Medication Sig Dispense Refill   amLODipine (NORVASC) 10 MG tablet Take 1 tablet (10 mg total) by mouth daily. 90 tablet 1   aspirin 81 MG tablet Take 81 mg by mouth daily.     atorvastatin (LIPITOR) 40 MG tablet Take 1 tablet (40 mg total) by mouth every morning. 90 tablet 1   ciclopirox (PENLAC) 8 % solution Apply topically at bedtime. Apply  over nail and surrounding skin. Apply daily over previous coat. After seven (7) days, may remove with alcohol and continue cycle. 6.6 mL 0   dapagliflozin propanediol (FARXIGA) 10 MG TABS tablet Take 1 tablet (10 mg total) by mouth daily. 90 tablet 1   gabapentin (NEURONTIN) 300 MG capsule Take 1 capsule (300 mg total) by mouth at bedtime. 90 capsule 1   glucose blood (ACCU-CHEK SMARTVIEW) test strip PATIENT NEEDS OFFICE VISIT FOR ADDITIONAL REFILLS - very overdue for DM check up. 100 each 0   lisinopril (ZESTRIL) 10 MG tablet TAKE 1 TABLET BY MOUTH ONCE DAILY IN THE MORNING 90 tablet 1   metFORMIN (GLUCOPHAGE) 1000 MG tablet TAKE 1 TABLET BY MOUTH TWICE DAILY WITH A MEAL 180 tablet 1   Semaglutide (RYBELSUS) 14 MG TABS Take 14 mg by mouth daily. 90 tablet 1   sitaGLIPtin (JANUVIA) 25 MG tablet Take 1 tablet (25 mg total) by mouth daily. 90 tablet 1   No current facility-administered medications for this visit.    No Known Allergies   REVIEW OF SYSTEMS:   [X]  denotes positive finding, [ ]  denotes negative finding Cardiac  Comments:  Chest pain or chest pressure:    Shortness of breath upon exertion:    Short of breath when lying flat:    Irregular heart rhythm:        Vascular    Pain in calf, thigh, or hip brought on by ambulation: X   Pain in feet at night that wakes you up from your sleep:     Blood clot in your veins:    Leg swelling:         Pulmonary    Oxygen at home:    Productive cough:     Wheezing:         Neurologic    Sudden weakness in arms or legs:     Sudden numbness in arms or legs:     Sudden onset of difficulty speaking or slurred speech:    Temporary loss of vision in one eye:     Problems with dizziness:         Gastrointestinal    Blood in stool:     Vomited blood:         Genitourinary    Burning when urinating:     Blood in urine:        Psychiatric    Major depression:  Hematologic    Bleeding problems:    Problems with blood  clotting too easily:        Skin    Rashes or ulcers:        Constitutional    Fever or chills:      PHYSICAL EXAMINATION:  There were no vitals filed for this visit.  General:  WDWN in NAD; vital signs documented above Gait: Not observed HENT: WNL, normocephalic Pulmonary: normal non-labored breathing Cardiac: regular HR, Abdomen: soft, NT, no masses Skin: without rashes Vascular Exam/Pulses:  Right Left  Radial 2+ (normal) 2+ (normal)  Ulnar 2+ (normal) 2+ (normal)  Femoral nonpalpable nonpalpable  Popliteal    DP nonpalpable nonpalpable  PT nonpalpable nonpalpable   Extremities: without ischemic changes, without Gangrene , without cellulitis; without open wounds;  Musculoskeletal: no muscle wasting or atrophy  Neurologic: A&O X 3;  No focal weakness or paresthesias are detected Psychiatric:  The pt has Normal affect.   Non-Invasive Vascular Imaging:       ASSESSMENT/PLAN: LORETHA URE is a 66 y.o. female presenting with bilateral lower extremity lifestyle limiting claudication, with rest pain present at night in the right foot.  ABIs demonstrate severe peripheral arterial disease bilaterally, and on physical exam the patient had nonpalpable femoral pulses bilaterally.  Gabrielle [name]'s peripheral arterial disease is defined as Rutherford 4 critical limb ischemia in the right leg, Rutherford 3 critical limb ischemia in the left leg.  I am concerned that she has significant inflow disease, and likely multilevel occlusive disease which is causing her rest pain, and severe lifestyle limiting claudication.  I had a long discussion with Gabrielle [name] and about the above and how she would be best served with CT angiography abdomen pelvis with runoff in an effort to assess her inflow.  I will call her with the results of this test.  In the meantime, I asked her to continue her current medication regimen and call should have any questions.   Broadus John, MD [facility] [phone]

## 2021-12-03 ENCOUNTER — Encounter: Payer: Self-pay | Admitting: Vascular Surgery

## 2021-12-03 ENCOUNTER — Other Ambulatory Visit: Payer: Self-pay

## 2021-12-03 ENCOUNTER — Ambulatory Visit: Payer: BC Managed Care – PPO | Admitting: Vascular Surgery

## 2021-12-03 VITALS — BP 113/73 | HR 90 | Temp 98.4°F | Ht <= 58 in | Wt 121.0 lb

## 2021-12-03 DIAGNOSIS — I70223 Atherosclerosis of native arteries of extremities with rest pain, bilateral legs: Secondary | ICD-10-CM

## 2021-12-03 NOTE — Progress Notes (Signed)
new

## 2021-12-05 NOTE — Progress Notes (Signed)
Subjective: 66 year old female presents also for Evaluation of nail fungus.  She has not yet started the medication and should have the nails trimmed as they are thick elongated.  No swelling or redness or drainage or signs of infection that she reports.  Last A1c was 7.9.  Objective: AAO x3, NAD DP/PT pulses 1/4  bilaterally, CRT less than 3 seconds Overall exam is unchanged.  The nails continue to be hypertrophic, dystrophic with yellow-brown discoloration.  No significant hyperpigmentation noted.  There is no edema or erythema or signs of infection.  No open lesions. No pain with calf compression, swelling, warmth, erythema  Assessment: Onychomycosis, PAD  Plan: -All treatment options discussed with the patient including all alternatives, risks, complications.  -As a courtesy debrided nails x10 without any complications or bleeding.  Ordered to monitor for any medication given concern for PAD as this can cause irritation of the skin.  She is to follow-up with vascular surgery tomorrow later this week given abnormal ABI and pain to her legs. -Patient encouraged to call the office with any questions, concerns, change in symptoms.   Trula Slade DPM

## 2021-12-13 ENCOUNTER — Ambulatory Visit (INDEPENDENT_AMBULATORY_CARE_PROVIDER_SITE_OTHER): Payer: BC Managed Care – PPO | Admitting: Nurse Practitioner

## 2021-12-13 ENCOUNTER — Other Ambulatory Visit: Payer: Self-pay

## 2021-12-13 ENCOUNTER — Encounter: Payer: Self-pay | Admitting: Nurse Practitioner

## 2021-12-13 DIAGNOSIS — J069 Acute upper respiratory infection, unspecified: Secondary | ICD-10-CM

## 2021-12-13 MED ORDER — AZITHROMYCIN 250 MG PO TABS: ORAL_TABLET | ORAL | 0 refills | Status: AC

## 2021-12-13 NOTE — Patient Instructions (Addendum)
URI Cough:   Stay well hydrated  Stay active  Deep breathing exercises  May take tylenol or fever or pain  May take mucinex twice daily for mucus   May take delsym for cough  May take zyrtec daily  Will order azithromycin    Follow up:  Follow up if needed

## 2021-12-13 NOTE — Progress Notes (Signed)
Virtual Visit via Telephone Note  I connected with Vena Rua on 12/13/21 at  2:00 PM EST by telephone and verified that I am speaking with the correct person using two identifiers.  Location: Patient: home Provider: office   I discussed the limitations, risks, security and privacy concerns of performing an evaluation and management service by telephone and the availability of in person appointments. I also discussed with the patient that there may be a patient responsible charge related to this service. The patient expressed understanding and agreed to proceed.   History of Present Illness:  Patient presents today for sick visit through telephone visit.  Patient states that her symptoms started this past Thursday.  Patient has taken a home COVID test which was negative.  Patient states that she is having cough, chest congestion, chest tightness.  She states that her cough is nonproductive.  She denies any significant fever. Denies f/c/s, n/v/d, hemoptysis, PND, chest pain or edema.   Observations/Objective:  Vitals with BMI 12/03/2021 11/26/2021 09/24/2021  Height 4\' 8"  - 4\' 8"   Weight 121 lbs 118 lbs 10 oz 118 lbs  BMI 09.38 - 18.29  Systolic 937 169 678  Diastolic 73 80 79  Pulse 90 86 87      Assessment and Plan:  URI Cough:   Stay well hydrated  Stay active  Deep breathing exercises  May take tylenol or fever or pain  May take mucinex twice daily for mucus   May take delsym for cough  May take zyrtec daily  Will order azithromycin    Follow up:  Follow up if needed     I discussed the assessment and treatment plan with the patient. The patient was provided an opportunity to ask questions and all were answered. The patient agreed with the plan and demonstrated an understanding of the instructions.   The patient was advised to call back or seek an in-person evaluation if the symptoms worsen or if the condition fails to improve as anticipated.  I  provided 23 minutes of non-face-to-face time during this encounter.   Fenton Foy, NP

## 2021-12-29 ENCOUNTER — Encounter: Payer: Self-pay | Admitting: Vascular Surgery

## 2021-12-31 ENCOUNTER — Other Ambulatory Visit: Payer: Self-pay

## 2021-12-31 DIAGNOSIS — I70223 Atherosclerosis of native arteries of extremities with rest pain, bilateral legs: Secondary | ICD-10-CM

## 2022-01-06 ENCOUNTER — Other Ambulatory Visit: Payer: Self-pay

## 2022-01-06 ENCOUNTER — Ambulatory Visit
Admission: RE | Admit: 2022-01-06 | Discharge: 2022-01-06 | Disposition: A | Discharge status: Home / Self Care | Payer: BC Managed Care – PPO | Source: Ambulatory Visit | Attending: Vascular Surgery | Admitting: Vascular Surgery

## 2022-01-06 DIAGNOSIS — I70223 Atherosclerosis of native arteries of extremities with rest pain, bilateral legs: Secondary | ICD-10-CM

## 2022-01-13 ENCOUNTER — Other Ambulatory Visit: Payer: Self-pay

## 2022-01-13 ENCOUNTER — Ambulatory Visit (HOSPITAL_COMMUNITY)
Admission: RE | Admit: 2022-01-13 | Discharge: 2022-01-13 | Disposition: A | Discharge status: Home / Self Care | Payer: BC Managed Care – PPO | Source: Ambulatory Visit | Attending: Vascular Surgery | Admitting: Vascular Surgery

## 2022-01-13 DIAGNOSIS — I70223 Atherosclerosis of native arteries of extremities with rest pain, bilateral legs: Secondary | ICD-10-CM

## 2022-01-13 LAB — POCT I-STAT CREATININE: Creatinine, Ser: 0.6 mg/dL (ref 0.44–1.00)

## 2022-01-13 MED ORDER — IOHEXOL 350 MG/ML SOLN
100.0000 mL | Freq: Once | INTRAVENOUS | Status: AC | PRN
  Administered 2022-01-13: 100 mL via INTRAVENOUS

## 2022-01-21 ENCOUNTER — Ambulatory Visit: Payer: BC Managed Care – PPO | Admitting: Vascular Surgery

## 2022-01-24 ENCOUNTER — Other Ambulatory Visit: Payer: Self-pay

## 2022-01-24 ENCOUNTER — Ambulatory Visit: Payer: BC Managed Care – PPO | Admitting: Vascular Surgery

## 2022-01-24 DIAGNOSIS — I70223 Atherosclerosis of native arteries of extremities with rest pain, bilateral legs: Secondary | ICD-10-CM

## 2022-01-24 NOTE — Progress Notes (Signed)
?  Progress Note ? ?Barrett is a 66 year old female who initially presented to clinic several months ago with Rutherford 4 critical limb ischemia in the right leg, Rutherford 3 limb ischemia in the left leg.  Since that time, she is continue to work full-time at [employer] in [employer].  I called her this afternoon to discuss her CT angiogram. ? ?Subjective: ?Mary had no new complaints ? ?Objective: ?There were no vitals filed for this visit.  ?Physical Examination ?Deferred due to phone call ? 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  The right common femoral artery was nearly occluded with dense atherosclerotic disease. ? ?I had a long conversation with Stanton Kidney that she would be best treated with aortobifemoral bypass.  I asked that she come into the office to discuss this further.  She asked to wait until April. ?Rosaria Ferries was advised to call my office immediately should her symptoms worsen, as I am happy to fit her in.  I will see her in April for surgical discussion. ? ? ?J. Melene Muller MD MS ?[facility] ?[phone] ?01/24/2022  ?5:13 PM ? ?

## 2022-02-14 ENCOUNTER — Ambulatory Visit (INDEPENDENT_AMBULATORY_CARE_PROVIDER_SITE_OTHER): Payer: BC Managed Care – PPO | Admitting: Vascular Surgery

## 2022-02-14 ENCOUNTER — Encounter: Payer: Self-pay | Admitting: Vascular Surgery

## 2022-02-14 VITALS — BP 144/75 | HR 94 | Temp 98.2°F | Resp 16 | Ht <= 58 in | Wt 118.0 lb

## 2022-02-14 DIAGNOSIS — I70223 Atherosclerosis of native arteries of extremities with rest pain, bilateral legs: Secondary | ICD-10-CM

## 2022-02-14 NOTE — Progress Notes (Signed)
?Office Note  ? ? ? ?HPI: Gabrielle [name] is a 66 y.o. (1956/03/22) female presenting for surgical discussion regarding right-sided Rutherford 4 critical limb ischemia, left-sided Rutherford 3 critical limb ischemia.   ? ?In short, Gabrielle [name] was initially seen in clinic at the request of Dr. Jacqualyn Posey DPM, for bilateral lower extremity claudication right greater than left.  Gabrielle [name] works full-time at [employer] in [place].  Over the last several months she is appreciated significant claudication symptoms in bilateral lower extremities that is affected her time at work.  She has difficulty ambulating from her desk to the restroom.  At her home, she limits activity to forego pain in bilateral calves.   ? ?Since her initial presentation, her bilateral lower extremity pain has continued to worsen.  She now has rest pain in the right leg, keeping her up at night.  Basic over-the-counter medications are not working to alleviate the pain she is unable to sleep, and it is affecting her work.  At work, she has severe bilateral lower extremity claudication, which is hampering her productivity.  She can no longer go out with friends, and has limited social life due to the pain. ? ?The pt is  on a statin for cholesterol management.  ?The pt is  on a daily aspirin.   Other AC:  - ?The pt is  on medication for hypertension.   ?The pt is  diabetic.  ?Tobacco hx:  - ? ?Past Medical History:  ?Diagnosis Date  ? Cancer [facility]) 2010  ? rectal CA  ? Cataract   ? Diabetes mellitus without complication ([place])   ? Phreesia 11/15/2020  ? Eczema   ? Hyperlipidemia   ? Hypertension   ? Intrinsic (urethral) sphincter deficiency (ISD)   ? RBBB   ? SUI (stress urinary incontinence, female)   ? hx of  ? Type 2 diabetes mellitus ([place])   ? Wears dentures   ? UPPER  ? Wears glasses   ? ? ?Past Surgical History:  ?Procedure Laterality Date  ? ABDOMINAL HYSTERECTOMY  1998  ? fibroids, partial  ? ABDOMINOPLASTY  1999  ? BLADDER SUSPENSION N/A 03/06/2014  ?  Procedure: [facility] SL[facility] ;  Surgeon: Irine Seal, MD;  Location: [facility];  Service: Urology;  Laterality: N/A;  ? CARDIOVASCULAR STRESS TEST  03-27-2008  ? NORMAL PERFUSION STUDY/  EF 54%  ? COLON SURGERY  2010  ? for rectal ca  ? COLONOSCOPY    ? EUS N/A 03/02/2017  ? Procedure: LOWER ENDOSCOPIC ULTRASOUND (EUS);  Surgeon: Milus Banister, MD;  Location: [facility];  Service: Endoscopy;  Laterality: N/A;  ? EXCISION LOWER ABDOMINAL SCAR POST ABDOMINOPLASTY  01-05-2000  ? ORIF RIGHT ANKLE FX  12-24-2008  ? RIGHT ANKLE ARTHROSCOPY W/ DEBRIDEMENT AND OPEN REMOVAL HARDWARE  03-12-2010  ? TRANSTHORACIC ECHOCARDIOGRAM  08-05-2009  ? MILD LVH/  EF 60-65%  ? TUBAL LIGATION    ? ? ?Social History  ? ?Socioeconomic History  ? Marital status: Married  ?  Spouse name: Not on file  ? Number of children: 1  ? Years of education: Not on file  ? Highest education level: Not on file  ?Occupational History  ? Not on file  ?Tobacco Use  ? Smoking status: Former  ?  Years: 20.00  ?  Types: Cigarettes  ?  Quit date: 11/14/2006  ?  Years since quitting: 15.2  ? Smokeless tobacco: Never  ?Vaping Use  ? Vaping Use: Never used  ?Substance  and Sexual Activity  ? Alcohol use: No  ? Drug use: No  ? Sexual activity: Not on file  ?Other Topics Concern  ? Not on file  ?Social History Narrative  ? Not on file  ? ?Social Determinants of Health  ? ?Financial Resource Strain: Not on file  ?Food Insecurity: Not on file  ?Transportation Needs: Not on file  ?Physical Activity: Not on file  ?Stress: Not on file  ?Social Connections: Not on file  ?Intimate Partner Violence: Not on file  ? ? ?Family History  ?Problem Relation Age of Onset  ? Diabetes Mother   ? Hypertension Mother   ? Diabetes Sister   ? Hypertension Sister   ? Diabetes Brother   ? Hypertension Brother   ? Lupus Daughter   ? Colon cancer Neg Hx   ? Esophageal cancer Neg Hx   ? Rectal cancer Neg Hx   ? Stomach cancer Neg Hx   ? Colon polyps Neg Hx   ? ? ?Current Outpatient  Medications  ?Medication Sig Dispense Refill  ? amLODipine (NORVASC) 10 MG tablet Take 1 tablet (10 mg total) by mouth daily. 90 tablet 1  ? aspirin 81 MG tablet Take 81 mg by mouth daily.    ? atorvastatin (LIPITOR) 40 MG tablet Take 1 tablet (40 mg total) by mouth every morning. 90 tablet 1  ? dapagliflozin propanediol (FARXIGA) 10 MG TABS tablet Take 1 tablet (10 mg total) by mouth daily. 90 tablet 1  ? gabapentin (NEURONTIN) 300 MG capsule Take 1 capsule (300 mg total) by mouth at bedtime. 90 capsule 1  ? glucose blood (ACCU-CHEK SMARTVIEW) test strip PATIENT NEEDS OFFICE VISIT FOR ADDITIONAL REFILLS - very overdue for DM check up. 100 each 0  ? lisinopril (ZESTRIL) 10 MG tablet TAKE 1 TABLET BY MOUTH ONCE DAILY IN THE MORNING 90 tablet 1  ? metFORMIN (GLUCOPHAGE) 1000 MG tablet TAKE 1 TABLET BY MOUTH TWICE DAILY WITH A MEAL 180 tablet 1  ? Semaglutide (RYBELSUS) 14 MG TABS Take 14 mg by mouth daily. 90 tablet 1  ? ciclopirox (PENLAC) 8 % solution Apply topically at bedtime. Apply over nail and surrounding skin. Apply daily over previous coat. After seven (7) days, may remove with alcohol and continue cycle. 6.6 mL 0  ? sitaGLIPtin (JANUVIA) 25 MG tablet Take 1 tablet (25 mg total) by mouth daily. 90 tablet 1  ? ?No current facility-administered medications for this visit.  ? ? ?No Known Allergies ? ? ?REVIEW OF SYSTEMS:  ? ?'[X]'$  denotes positive finding, '[ ]'$  denotes negative finding ?Cardiac  Comments:  ?Chest pain or chest pressure:    ?Shortness of breath upon exertion:    ?Short of breath when lying flat:    ?Irregular heart rhythm:    ?    ?Vascular    ?Pain in calf, thigh, or hip brought on by ambulation: X   ?Pain in feet at night that wakes you up from your sleep:     ?Blood clot in your veins:    ?Leg swelling:     ?    ?Pulmonary    ?Oxygen at home:    ?Productive cough:     ?Wheezing:     ?    ?Neurologic    ?Sudden weakness in arms or legs:     ?Sudden numbness in arms or legs:     ?Sudden onset of  difficulty speaking or slurred speech:    ?Temporary loss of vision in one eye:     ?  Problems with dizziness:     ?    ?Gastrointestinal    ?Blood in stool:     ?Vomited blood:     ?    ?Genitourinary    ?Burning when urinating:     ?Blood in urine:    ?    ?Psychiatric    ?Major depression:     ?    ?Hematologic    ?Bleeding problems:    ?Problems with blood clotting too easily:    ?    ?Skin    ?Rashes or ulcers:    ?    ?Constitutional    ?Fever or chills:    ? ? ?PHYSICAL EXAMINATION: ? ?Vitals:  ? 02/14/22 1531  ?BP: (!) 144/75  ?Pulse: 94  ?Resp: 16  ?Temp: 98.2 ?F (36.8 ?C)  ?TempSrc: Temporal  ?SpO2: 96%  ?Weight: 118 lb (53.5 kg)  ?Height: '4\' 8"'$  (1.422 m)  ? ? ?General:  WDWN in NAD; vital signs documented above ?Gait: Not observed ?HENT: WNL, normocephalic ?Pulmonary: normal non-labored breathing ?Cardiac: regular HR, ?Abdomen: soft, NT, no masses ?Skin: without rashes ?Vascular Exam/Pulses: ? Right Left  ?Radial 2+ (normal) 2+ (normal)  ?Ulnar 2+ (normal) 2+ (normal)  ?Femoral nonpalpable nonpalpable  ?Popliteal    ?DP nonpalpable nonpalpable  ?PT nonpalpable nonpalpable  ? ?Extremities: without ischemic changes, without Gangrene , without cellulitis; without open wounds;  ?Musculoskeletal: no muscle wasting or atrophy  ?Neurologic: A&O X 3;  No focal weakness or paresthesias are detected ?Psychiatric:  The pt has Normal affect. ? ? ?Non-Invasive Vascular Imaging:   ? ? ? ? ?ASSESSMENT/PLAN: Gabrielle [name] is a 66 y.o. female presenting with rest pain in the right leg, lifestyle limiting claudication in the left leg.  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 ? 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  At her young age, endovascular repair would not be a durable option and would still require right common femoral  endarterectomy.  Gabrielle [name] would be best served with aortobifemoral bypass surgery.  I had a long discussion with her regarding this and regarding the risks and benefits which include death infection, bleeding, ne

## 2022-02-18 ENCOUNTER — Ambulatory Visit: Payer: BC Managed Care – PPO | Admitting: Vascular Surgery

## 2022-02-23 ENCOUNTER — Encounter: Payer: Self-pay | Admitting: Internal Medicine

## 2022-02-23 ENCOUNTER — Ambulatory Visit: Payer: BC Managed Care – PPO | Admitting: Internal Medicine

## 2022-02-23 VITALS — BP 132/70 | HR 85 | Ht <= 58 in | Wt 120.6 lb

## 2022-02-23 DIAGNOSIS — Z01810 Encounter for preprocedural cardiovascular examination: Secondary | ICD-10-CM

## 2022-02-23 NOTE — Patient Instructions (Signed)
Medication Instructions:  ?No Changes In Medications at this time.  ?*If you need a refill on your cardiac medications before your next appointment, please call your pharmacy* ? ?Lab Work: ?None Ordered At This Time.  ?If you have labs (blood work) drawn today and your tests are completely normal, you will receive your results only by: ?MyChart Message (if you have MyChart) OR ?A paper copy in the mail ?If you have any lab test that is abnormal or we need to change your treatment, we will call you to review the results. ? ?Testing/Procedures: ?None Ordered At This Time.  ? ?Follow-Up: ?At [facility], you and your health needs are our priority.  As part of our continuing mission to provide you with exceptional heart care, we have created designated Provider Care Teams.  These Care Teams include your primary Cardiologist (physician) and Advanced Practice Providers (APPs -  Physician Assistants and Nurse Practitioners) who all work together to provide you with the care you need, when you need it. ? ?Your next appointment:   ?AS NEEDED  ? ?The format for your next appointment:   ?In Person ? ?Provider:   ?Janina Mayo, MD   ? ?Important Information About Sugar ? ? ? ? ?  ?

## 2022-02-23 NOTE — Progress Notes (Signed)
?Cardiology Office Note:   ? ?Date:  02/23/2022  ? ?ID:  Gabrielle [name], DOB Oct 19, 1956, MRN [id number] ? ?PCP:  Dorna Mai, MD ?  ?[facility] Providers ?Cardiologist:  Janina Mayo, MD    ? ?Referring MD: Dorna Mai, MD  ? ?No chief complaint on file. ?Pre-operative clearance ? ?History of Present Illness:   ? ?Gabrielle [name] is a 66 y.o. female with a hx of DM2, HTN, PAD, former smoker, who was referred for pre-operative clearance for aortobifemoral bypass  ? ?She was seen by vascular surgery. She saw Dr. Virl Cagey and she noted: "Over the last several months she is appreciated significant claudication symptoms in bilateral lower extremities that is affected her time at work.  She has difficulty ambulating from her desk to the restroom.  At her home, she limits activity to forego pain in bilateral calves.   ?  ?Since her initial presentation, her bilateral lower extremity pain has continued to worsen.  She now has rest pain in the right leg, keeping her up at night.  Basic over-the-counter medications are not working to alleviate the pain she is unable to sleep, and it is affecting her work.  At work, she has severe bilateral lower extremity claudication, which is hampering her productivity.  She can no longer go out with friends, and has limited social life due to the pain." ? 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 ? ?With symptoms at rest this is concerning for chronic critical limb ischemia. She was referred to cardiology for a pre-operative evaluation ? ?She has no heart disease hx. She has not seen a cardiologist. She had a stress test in the past, it was presumably normal.  ? ?She can walk up one flight of stairs without SOB or chest pain. No recent hospitalizations. No palpitations. No  syncope. No orthopnea or PND. No LE edema. ? ?She smoked for approximately 10 years and quit in 2008. 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 ?No family hx of premature CAD. Family hx of diabetes ? ?Past Medical History:  ?Diagnosis Date  ? Cancer [facility]) 2010  ? rectal CA  ? Cataract   ? Diabetes mellitus without complication ([place])   ? Phreesia 11/15/2020  ? Eczema   ? Hyperlipidemia   ? Hypertension   ? Intrinsic (urethral) sphincter deficiency (ISD)   ? RBBB   ? SUI (stress urinary incontinence, female)   ? hx of  ? Type 2 diabetes mellitus ([place])   ? Wears dentures   ? UPPER  ? Wears glasses   ? ? ?Past Surgical History:  ?Procedure Laterality Date  ? ABDOMINAL HYSTERECTOMY  1998  ? fibroids, partial  ? ABDOMINOPLASTY  1999  ? BLADDER SUSPENSION N/A 03/06/2014  ? Procedure: [facility] ;  Surgeon: Irine Seal, MD;  Location: [facility];  Service: Urology;  Laterality: N/A;  ? CARDIOVASCULAR STRESS TEST  03-27-2008  ? NORMAL PERFUSION STUDY/  EF 54%  ? COLON SURGERY  2010  ? for rectal ca  ? COLONOSCOPY    ? EUS N/A 03/02/2017  ? Procedure: LOWER ENDOSCOPIC ULTRASOUND (EUS);  Surgeon: Milus Banister, MD;  Location: [facility];  Service: Endoscopy;  Laterality: N/A;  ? EXCISION LOWER ABDOMINAL SCAR POST ABDOMINOPLASTY  01-05-2000  ? ORIF RIGHT ANKLE FX  12-24-2008  ? RIGHT  ANKLE ARTHROSCOPY W/ DEBRIDEMENT AND OPEN REMOVAL HARDWARE  03-12-2010  ? TRANSTHORACIC ECHOCARDIOGRAM  08-05-2009  ? MILD LVH/  EF 60-65%  ? TUBAL LIGATION    ? ? ?Current Medications: ?Current Meds  ?Medication Sig  ? amLODipine (NORVASC) 10 MG tablet Take 1 tablet (10 mg total) by mouth daily.  ? aspirin 81 MG tablet Take 81 mg by mouth daily.  ? atorvastatin (LIPITOR) 40 MG tablet Take 1 tablet (40 mg total) by mouth every morning.  ? dapagliflozin propanediol (FARXIGA) 10 MG TABS tablet Take 1 tablet (10 mg total) by mouth daily.  ? gabapentin (NEURONTIN) 300 MG capsule Take 1 capsule (300 mg total) by mouth at bedtime.  ? glucose blood  (ACCU-CHEK SMARTVIEW) test strip PATIENT NEEDS OFFICE VISIT FOR ADDITIONAL REFILLS - very overdue for DM check up.  ? lisinopril (ZESTRIL) 10 MG tablet TAKE 1 TABLET BY MOUTH ONCE DAILY IN THE MORNING  ? metFORMIN (GLUCOPHAGE) 1000 MG tablet TAKE 1 TABLET BY MOUTH TWICE DAILY WITH A MEAL  ? Semaglutide (RYBELSUS) 14 MG TABS Take 14 mg by mouth daily.  ? sitaGLIPtin (JANUVIA) 25 MG tablet Take 1 tablet (25 mg total) by mouth daily.  ?  ? ?Allergies:   Patient has no known allergies.  ? ?Social History  ? ?Socioeconomic History  ? Marital status: Married  ?  Spouse name: Not on file  ? Number of children: 1  ? Years of education: Not on file  ? Highest education level: Not on file  ?Occupational History  ? Not on file  ?Tobacco Use  ? Smoking status: Former  ?  Years: 20.00  ?  Types: Cigarettes  ?  Quit date: 11/14/2006  ?  Years since quitting: 15.2  ? Smokeless tobacco: Never  ?Vaping Use  ? Vaping Use: Never used  ?Substance and Sexual Activity  ? Alcohol use: No  ? Drug use: No  ? Sexual activity: Not on file  ?Other Topics Concern  ? Not on file  ?Social History Narrative  ? Not on file  ? ?Social Determinants of Health  ? ?Financial Resource Strain: Not on file  ?Food Insecurity: Not on file  ?Transportation Needs: Not on file  ?Physical Activity: Not on file  ?Stress: Not on file  ?Social Connections: Not on file  ?  ? ?Family History: ?The patient's family history includes Diabetes in her brother, mother, and sister; Hypertension in her brother, mother, and sister; Lupus in her daughter. There is no history of Colon cancer, Esophageal cancer, Rectal cancer, Stomach cancer, or Colon polyps. ? ?ROS:   ?Please see the history of present illness.    ? All other systems reviewed and are negative. ? ?EKGs/Labs/Other Studies Reviewed:   ? ?The following studies were reviewed today: ? ? ?EKG:  EKG is  ordered today.  The ekg ordered today demonstrates  ? ?NSR, RBBB ( benign) ? ?Recent Labs: ?03/22/2021: ALT 15; BUN 11;  Hemoglobin 14.5; Platelets 261; Potassium 4.4; Sodium 137 ?01/13/2022: Creatinine, Ser 0.60  ?Recent Lipid Panel ?   ?Component Value Date/Time  ? CHOL 170 03/22/2021 0916  ? TRIG 134 03/22/2021 0916  ? HDL 29 (L) 03/22/2021 0916  ? CHOLHDL 5.9 (H) 03/22/2021 0814  ? CHOLHDL 5.3 (H) 11/11/2015 1921  ? VLDL 25 11/11/2015 1921  ? [place] 117 (H) 03/22/2021 4818  ? LDLDIRECT 144 (H) 10/26/2012 1723  ? ? ? ?Risk Assessment/Calculations:   ?  ? ?    ? ?Physical Exam:   ? ?  VS:  ? ?Vitals:  ? 02/23/22 0840  ?BP: 132/70  ?Pulse: 85  ?SpO2: 97%  ? ? ? ?Wt Readings from Last 3 Encounters:  ?02/23/22 120 lb 9.6 oz (54.7 kg)  ?02/14/22 118 lb (53.5 kg)  ?12/03/21 121 lb (54.9 kg)  ?  ? ?GEN:  Well nourished, well developed in no acute distress ?HEENT: Normal ?NECK: No JVD; No carotid bruits ?LYMPHATICS: No lymphadenopathy ?CARDIAC: RRR, no murmurs, rubs, gallops ?RESPIRATORY:  Clear to auscultation without rales, wheezing or rhonchi  ?ABDOMEN: Soft, non-tender, non-distended ?MUSCULOSKELETAL:  No edema; No deformity  ?SKIN: Warm and dry ?NEUROLOGIC:  Alert and oriented x 3 ?PSYCHIATRIC:  Normal affect  ? ?ASSESSMENT:   ? ?Pre-Op: Gabrielle [name] presents for pre-op. She has CAD equivalent disease.  Her EKG is benign. She can walk > 4 METS without shortness of breath or chest pressure.  She is low to intermediate risk for intermediate risk surgery. She is acceptable cardiac risk for intermediate risk procedure, aortobifemoral bypass. No plans for further ischemic evaluation. Continue statin, aspirin. Blood pressure is in good control. Continue lisinopril and amlodipine. Continue farxiga.  ? ? ? ?PLAN:   ? ?In order of problems listed above: ? ?She is acceptable cardiac risk for intermediate risk procedure, aortobifemoral bypass  ?Follow up as needed ? ?   ? ?   ?Medication Adjustments/Labs and Tests Ordered: ?Current medicines are reviewed at length with the patient today.  Concerns regarding medicines are outlined above.  ?Orders  Placed This Encounter  ?Procedures  ? EKG 12-Lead  ? ?No orders of the defined types were placed in this encounter. ? ? ?Patient Instructions  ?Medication Instructions:  ?No Changes In Medications at this

## 2022-02-24 ENCOUNTER — Encounter: Payer: Self-pay | Admitting: Vascular Surgery

## 2022-02-24 ENCOUNTER — Other Ambulatory Visit: Payer: Self-pay

## 2022-02-24 DIAGNOSIS — I70223 Atherosclerosis of native arteries of extremities with rest pain, bilateral legs: Secondary | ICD-10-CM

## 2022-03-11 NOTE — Progress Notes (Signed)
Surgical Instructions ? ? ? Your procedure is scheduled on Monday, May 8th, 2023. ? ? Report to [facility] Main Entrance "A" at 05:30 A.M., then check in with the Admitting office. ? Call this number if you have problems the morning of surgery: ? [phone] ? ? If you have any questions prior to your surgery date call [phone]: Open Monday-Friday 8am-4pm ? ? ? Remember: ? Do not eat or drink after midnight the night before your surgery ? ? Take these medicines the morning of surgery with A SIP OF WATER:  ? ?amLODipine (NORVASC) ?atorvastatin (LIPITOR)  ? ?Follow your surgeon's instructions on when to stop Aspirin.  If no instructions were given by your surgeon then you will need to call the office to get those instructions.    ? ?As of today, STOP taking any Aspirin (unless otherwise instructed by your surgeon) Aleve, Naproxen, Ibuprofen, Motrin, Advil, Goody's, BC's, all herbal medications, fish oil, and all vitamins. ? ? ?WHAT DO I DO ABOUT MY DIABETES MEDICATION? ? ? ?Do not take metFORMIN (GLUCOPHAGE), Semaglutide (RYBELSUS), and sitaGLIPtin (JANUVIA) the morning of surgery. ? ?Do not take dapagliflozin propanediol (FARXIGA) the day before surgery and the day of surgery. ? ? ?HOW TO MANAGE YOUR DIABETES ?BEFORE AND AFTER SURGERY ? ?Why is it important to control my blood sugar before and after surgery? ?Improving blood sugar levels before and after surgery helps healing and can limit problems. ?A way of improving blood sugar control is eating a healthy diet by: ? Eating less sugar and carbohydrates ? Increasing activity/exercise ? Talking with your doctor about reaching your blood sugar goals ?High blood sugars (greater than 180 mg/dL) can raise your risk of infections and slow your recovery, so you will need to focus on controlling your diabetes during the weeks before surgery. ?Make sure that the doctor who takes care of your diabetes knows about your planned surgery including the date and  location. ? ?How do I manage my blood sugar before surgery? ?Check your blood sugar at least 4 times a day, starting 2 days before surgery, to make sure that the level is not too high or low. ? ?Check your blood sugar the morning of your surgery when you wake up and every 2 hours until you get to the Short Stay unit. ? ?If your blood sugar is less than 70 mg/dL, you will need to treat for low blood sugar: ?Do not take insulin. ?Treat a low blood sugar (less than 70 mg/dL) with ? cup of clear juice (cranberry or apple), 4 glucose tablets, OR glucose gel. ?Recheck blood sugar in 15 minutes after treatment (to make sure it is greater than 70 mg/dL). If your blood sugar is not greater than 70 mg/dL on recheck, call [phone] for further instructions. ?Report your blood sugar to the short stay nurse when you get to Short Stay. ? ?If you are admitted to the hospital after surgery: ?Your blood sugar will be checked by the staff and you will probably be given insulin after surgery (instead of oral diabetes medicines) to make sure you have good blood sugar levels. ?The goal for blood sugar control after surgery is 80-180 mg/dL.  ? ? ? The day of surgery: ?         ?Do not wear jewelry or makeup ?Do not wear lotions, powders, perfumes, or deodorant. ?Do not shave 48 hours prior to surgery.   ?Do not bring valuables to the hospital. ?Do not wear nail polish, gel polish,  artificial nails, or any other type of covering on natural nails (fingers and toes) ?If you have artificial nails or gel coating that need to be removed by a nail salon, please have this removed prior to surgery. Artificial nails or gel coating may interfere with anesthesia's ability to adequately monitor your vital signs. ? ? ?[facility] is not responsible for any belongings or valuables. .  ? ?Do NOT Smoke (Tobacco/Vaping)  24 hours prior to your procedure ? ?If you use a CPAP at night, you may bring your mask for your overnight stay. ?  ?Contacts,  glasses, hearing aids, dentures or partials may not be worn into surgery, please bring cases for these belongings ?  ?For patients admitted to the hospital, discharge time will be determined by your treatment team. ?  ?Patients discharged the day of surgery will not be allowed to drive home, and someone needs to stay with them for 24 hours. ? ? ?SURGICAL WAITING ROOM VISITATION ?Patients having surgery or a procedure in a hospital may have two support people. ?Children under the age of 71 must have an adult with them who is not the patient. ?They may stay in the waiting area during the procedure and may switch out with other visitors. If the patient needs to stay at the hospital during part of their recovery, the visitor guidelines for inpatient rooms apply. ? ?Please refer to the [facility] website for the visitor guidelines for Inpatients (after your surgery is over and you are in a regular room).  ? ? ?Special instructions:   ? ?Oral Hygiene is also important to reduce your risk of infection.  Remember - BRUSH YOUR TEETH THE MORNING OF SURGERY WITH YOUR REGULAR TOOTHPASTE ? ? ?[facility]- Preparing For Surgery ? ?Before surgery, you can play an important role. Because skin is not sterile, your skin needs to be as free of germs as possible. You can reduce the number of germs on your skin by washing with CHG (chlorahexidine gluconate) Soap before surgery.  CHG is an antiseptic cleaner which kills germs and bonds with the skin to continue killing germs even after washing.   ? ? ?Please do not use if you have an allergy to CHG or antibacterial soaps. If your skin becomes reddened/irritated stop using the CHG.  ?Do not shave (including legs and underarms) for at least 48 hours prior to first CHG shower. It is OK to shave your face. ? ?Please follow these instructions carefully. ?  ? ? Shower the NIGHT BEFORE SURGERY and the MORNING OF SURGERY with CHG Soap.  ? If you chose to wash your hair, wash your hair first as  usual with your normal shampoo. After you shampoo, rinse your hair and body thoroughly to remove the shampoo.  Then [employer] and genitals (private parts) with your normal soap and rinse thoroughly to remove soap. ? ?After that Use CHG Soap as you would any other liquid soap. You can apply CHG directly to the skin and wash gently with a scrungie or a clean washcloth.  ? ?Apply the CHG Soap to your body ONLY FROM THE NECK DOWN.  Do not use on open wounds or open sores. Avoid contact with your eyes, ears, mouth and genitals (private parts). Wash Face and genitals (private parts)  with your normal soap.  ? ?Wash thoroughly, paying special attention to the area where your surgery will be performed. ? ?Thoroughly rinse your body with warm water from the neck down. ? ?DO NOT  shower/wash with your normal soap after using and rinsing off the CHG Soap. ? ?Pat yourself dry with a CLEAN TOWEL. ? ?Wear CLEAN PAJAMAS to bed the night before surgery ? ?Place CLEAN SHEETS on your bed the night before your surgery ? ?DO NOT SLEEP WITH PETS. ? ? ?Day of Surgery: ? ?Take a shower with CHG soap. ?Wear Clean/Comfortable clothing the morning of surgery ?Do not apply any deodorants/lotions.   ?Remember to brush your teeth WITH YOUR REGULAR TOOTHPASTE. ? ? ? ?If you received a COVID test during your pre-op visit, it is requested that you wear a mask when out in public, stay away from anyone that may not be feeling well, and notify your surgeon if you develop symptoms. If you have been in contact with anyone that has tested positive in the last 10 days, please notify your surgeon. ? ?  ?Please read over the following fact sheets that you were given.   ?

## 2022-03-14 ENCOUNTER — Encounter (HOSPITAL_COMMUNITY)
Admission: RE | Admit: 2022-03-14 | Discharge: 2022-03-14 | Disposition: A | Discharge status: Home / Self Care | Payer: BC Managed Care – PPO | Source: Ambulatory Visit | Attending: Vascular Surgery | Admitting: Vascular Surgery

## 2022-03-14 ENCOUNTER — Other Ambulatory Visit: Payer: Self-pay

## 2022-03-14 ENCOUNTER — Encounter (HOSPITAL_COMMUNITY): Payer: Self-pay

## 2022-03-14 VITALS — BP 100/81 | HR 101 | Temp 98.4°F | Resp 17 | Ht <= 58 in | Wt 118.0 lb

## 2022-03-14 DIAGNOSIS — Z01818 Encounter for other preprocedural examination: Secondary | ICD-10-CM

## 2022-03-14 DIAGNOSIS — E1165 Type 2 diabetes mellitus with hyperglycemia: Secondary | ICD-10-CM | POA: Diagnosis not present

## 2022-03-14 DIAGNOSIS — Z01812 Encounter for preprocedural laboratory examination: Secondary | ICD-10-CM | POA: Insufficient documentation

## 2022-03-14 DIAGNOSIS — I70223 Atherosclerosis of native arteries of extremities with rest pain, bilateral legs: Secondary | ICD-10-CM

## 2022-03-14 LAB — SURGICAL PCR SCREEN
MRSA, PCR: NEGATIVE
Staphylococcus aureus: NEGATIVE

## 2022-03-14 LAB — URINALYSIS, ROUTINE W REFLEX MICROSCOPIC
Bacteria, UA: NONE SEEN
Bilirubin Urine: NEGATIVE
Glucose, UA: >=500 mg/dL — AB
Ketones, ur: NEGATIVE mg/dL
Leukocytes,Ua: NEGATIVE
Nitrite: NEGATIVE
Protein, ur: NEGATIVE mg/dL
Specific Gravity, Urine: 1.028 (ref 1.005–1.030)
pH: 6 (ref 5.0–8.0)

## 2022-03-14 LAB — HEMOGLOBIN A1C
Hgb A1c MFr Bld: 9.2 % — ABNORMAL HIGH (ref 4.8–5.6)
Mean Plasma Glucose: 217.34 mg/dL

## 2022-03-14 LAB — COMPREHENSIVE METABOLIC PANEL
ALT: 19 U/L (ref 0–44)
AST: 22 U/L (ref 15–41)
Albumin: 4.2 g/dL (ref 3.5–5.0)
Alkaline Phosphatase: 87 U/L (ref 38–126)
Anion gap: 13 (ref 5–15)
BUN: 9 mg/dL (ref 8–23)
CO2: 25 mmol/L (ref 22–32)
Calcium: 10.3 mg/dL (ref 8.9–10.3)
Chloride: 100 mmol/L (ref 98–111)
Creatinine, Ser: 0.88 mg/dL (ref 0.44–1.00)
GFR, Estimated: >60 mL/min (ref >60)
Glucose, Bld: 174 mg/dL — ABNORMAL HIGH (ref 70–99)
Potassium: 4.1 mmol/L (ref 3.5–5.1)
Sodium: 138 mmol/L (ref 135–145)
Total Bilirubin: 0.7 mg/dL (ref 0.3–1.2)
Total Protein: 8 g/dL (ref 6.5–8.1)

## 2022-03-14 LAB — PROTIME-INR
INR: 1.1 (ref 0.8–1.2)
Prothrombin Time: 14.3 seconds (ref 11.4–15.2)

## 2022-03-14 LAB — CBC
HCT: 46.5 % — ABNORMAL HIGH (ref 36.0–46.0)
Hemoglobin: 15.2 g/dL — ABNORMAL HIGH (ref 12.0–15.0)
MCH: 29.4 pg (ref 26.0–34.0)
MCHC: 32.7 g/dL (ref 30.0–36.0)
MCV: 89.9 fL (ref 80.0–100.0)
Platelets: 261 10*3/uL (ref 150–400)
RBC: 5.17 MIL/uL — ABNORMAL HIGH (ref 3.87–5.11)
RDW: 12.2 % (ref 11.5–15.5)
WBC: 8.1 10*3/uL (ref 4.0–10.5)
nRBC: 0 % (ref 0.0–0.2)

## 2022-03-14 LAB — APTT: aPTT: 30 seconds (ref 24–36)

## 2022-03-14 LAB — GLUCOSE, CAPILLARY: Glucose-Capillary: 212 mg/dL — ABNORMAL HIGH (ref 70–99)

## 2022-03-14 NOTE — Progress Notes (Signed)
PCP - Dr. Dorna Mai ?Cardiologist - Pt saw Dr. Phineas Inches ? ?PPM/ICD - denies ? ? ?Chest x-ray - 02/02/15 ?EKG - 02/23/22 ?Stress Test - 03/27/2008 ?ECHO - 08/05/2009 ?Cardiac Cath - denies ? ?Sleep Study - denies ? ? ?DM- Type 2 ?Pt does not check CBG at home ? ?Blood Thinner Instructions: n/a ?Aspirin Instructions: pt advised to f/u with surgeon for instructions on ASA ? ?ERAS Protcol - no, NPO ? ? ?COVID TEST- n/a ? ? ?Anesthesia review: no. Pt saw cardiology just for pre-op clearance. James aware. No issues to review ? ?Patient denies shortness of breath, fever, cough and chest pain at PAT appointment ? ? ?All instructions explained to the patient, with a verbal understanding of the material. Patient agrees to go over the instructions while at home for a better understanding. Patient also instructed to notify surgeon of any contact with COVID+ person or if she develops any issues. The opportunity to ask questions was provided. ?  ?

## 2022-03-16 ENCOUNTER — Telehealth: Payer: Self-pay

## 2022-03-16 ENCOUNTER — Encounter: Payer: Self-pay | Admitting: Family Medicine

## 2022-03-16 DIAGNOSIS — Z0279 Encounter for issue of other medical certificate: Secondary | ICD-10-CM

## 2022-03-16 NOTE — Telephone Encounter (Signed)
Spoke with pt and advised that her Short term Dis. Form is completed and faxed with sucessful confirmation. Patient voiced her understanding. ?

## 2022-03-20 ENCOUNTER — Encounter (HOSPITAL_COMMUNITY): Payer: Self-pay | Admitting: Vascular Surgery

## 2022-03-20 NOTE — Anesthesia Preprocedure Evaluation (Addendum)
Anesthesia Evaluation  ?Patient identified by MRN, date of birth, ID band ?Patient awake ? ? ? ?Reviewed: ?Allergy & Precautions, NPO status , Patient's Chart, lab work & pertinent test results ? ?Airway ?Mallampati: I ? ?TM Distance: >3 FB ?Neck ROM: Full ? ? ? Dental ? ?(+) Edentulous Upper, Edentulous Lower ?  ?Pulmonary ?former smoker,  ?  ?breath sounds clear to auscultation ? ? ? ? ? ? Cardiovascular ?hypertension, Pt. on medications ?+ Peripheral Vascular Disease  ?+ dysrhythmias  ?Rhythm:Regular Rate:Normal ? ? ?  ?Neuro/Psych ?negative neurological ROS ? negative psych ROS  ? GI/Hepatic ?negative GI ROS, Neg liver ROS,   ?Endo/Other  ?diabetes, Type 2, Oral Hypoglycemic Agents ? Renal/GU ?negative Renal ROS  ? ?  ?Musculoskeletal ?negative musculoskeletal ROS ?(+)  ? Abdominal ?Normal abdominal exam  (+)   ?Peds ? Hematology ?negative hematology ROS ?(+)   ?Anesthesia Other Findings ? ? Reproductive/Obstetrics ? ?  ? ? ? ? ? ? ? ? ? ? ? ? ? ?  ?  ? ? ? ? ? ? ? ?Anesthesia Physical ?Anesthesia Plan ? ?ASA: 3 ? ?Anesthesia Plan: General  ? ?Post-op Pain Management:   ? ?Induction: Intravenous ? ?PONV Risk Score and Plan: 4 or greater and Ondansetron, Dexamethasone, Midazolam and Scopolamine patch - Pre-op ? ?Airway Management Planned: Oral ETT ? ?Additional Equipment: Arterial line, CVP and Ultrasound Guidance Line Placement ? ?Intra-op Plan:  ? ?Post-operative Plan: Extubation in OR ? ?Informed Consent: I have reviewed the patients History and Physical, chart, labs and discussed the procedure including the risks, benefits and alternatives for the proposed anesthesia with the patient or authorized representative who has indicated his/her understanding and acceptance.  ? ? ? ?Dental advisory given ? ?Plan Discussed with: CRNA ? ?Anesthesia Plan Comments:   ? ? ? ? ? ?Anesthesia Quick Evaluation ? ?

## 2022-03-21 ENCOUNTER — Inpatient Hospital Stay (HOSPITAL_COMMUNITY)
Admission: RE | Admit: 2022-03-21 | Discharge: 2022-05-01 | DRG: 270 | Disposition: A | Discharge status: Other Acute Inpatient Hospital | Payer: BC Managed Care – PPO | Attending: Internal Medicine | Admitting: Internal Medicine

## 2022-03-21 ENCOUNTER — Inpatient Hospital Stay (HOSPITAL_COMMUNITY): Payer: BC Managed Care – PPO | Admitting: Physician Assistant

## 2022-03-21 ENCOUNTER — Encounter (HOSPITAL_COMMUNITY)
Admission: RE | Disposition: A | Discharge status: Other Acute Inpatient Hospital | Payer: Self-pay | Source: Home / Self Care | Attending: Vascular Surgery

## 2022-03-21 ENCOUNTER — Inpatient Hospital Stay (HOSPITAL_COMMUNITY): Payer: BC Managed Care – PPO

## 2022-03-21 ENCOUNTER — Inpatient Hospital Stay (HOSPITAL_COMMUNITY): Payer: BC Managed Care – PPO | Admitting: Certified Registered Nurse Anesthetist

## 2022-03-21 ENCOUNTER — Other Ambulatory Visit: Payer: Self-pay

## 2022-03-21 DIAGNOSIS — K9189 Other postprocedural complications and disorders of digestive system: Secondary | ICD-10-CM | POA: Diagnosis not present

## 2022-03-21 DIAGNOSIS — D696 Thrombocytopenia, unspecified: Secondary | ICD-10-CM | POA: Diagnosis not present

## 2022-03-21 DIAGNOSIS — E1165 Type 2 diabetes mellitus with hyperglycemia: Secondary | ICD-10-CM | POA: Diagnosis not present

## 2022-03-21 DIAGNOSIS — I70229 Atherosclerosis of native arteries of extremities with rest pain, unspecified extremity: Secondary | ICD-10-CM | POA: Diagnosis present

## 2022-03-21 DIAGNOSIS — E1151 Type 2 diabetes mellitus with diabetic peripheral angiopathy without gangrene: Principal | ICD-10-CM | POA: Diagnosis present

## 2022-03-21 DIAGNOSIS — Z85048 Personal history of other malignant neoplasm of rectum, rectosigmoid junction, and anus: Secondary | ICD-10-CM

## 2022-03-21 DIAGNOSIS — E785 Hyperlipidemia, unspecified: Secondary | ICD-10-CM | POA: Diagnosis not present

## 2022-03-21 DIAGNOSIS — J690 Pneumonitis due to inhalation of food and vomit: Secondary | ICD-10-CM | POA: Diagnosis not present

## 2022-03-21 DIAGNOSIS — N390 Urinary tract infection, site not specified: Secondary | ICD-10-CM | POA: Diagnosis not present

## 2022-03-21 DIAGNOSIS — L89154 Pressure ulcer of sacral region, stage 4: Secondary | ICD-10-CM | POA: Diagnosis not present

## 2022-03-21 DIAGNOSIS — N179 Acute kidney failure, unspecified: Secondary | ICD-10-CM | POA: Diagnosis not present

## 2022-03-21 DIAGNOSIS — Z6826 Body mass index (BMI) 26.0-26.9, adult: Secondary | ICD-10-CM

## 2022-03-21 DIAGNOSIS — I10 Essential (primary) hypertension: Secondary | ICD-10-CM | POA: Diagnosis not present

## 2022-03-21 DIAGNOSIS — I70223 Atherosclerosis of native arteries of extremities with rest pain, bilateral legs: Secondary | ICD-10-CM | POA: Diagnosis present

## 2022-03-21 DIAGNOSIS — G9341 Metabolic encephalopathy: Secondary | ICD-10-CM | POA: Diagnosis not present

## 2022-03-21 DIAGNOSIS — E119 Type 2 diabetes mellitus without complications: Secondary | ICD-10-CM

## 2022-03-21 DIAGNOSIS — T8111XA Postprocedural  cardiogenic shock, initial encounter: Secondary | ICD-10-CM | POA: Diagnosis not present

## 2022-03-21 DIAGNOSIS — E43 Unspecified severe protein-calorie malnutrition: Secondary | ICD-10-CM | POA: Diagnosis not present

## 2022-03-21 DIAGNOSIS — B9620 Unspecified Escherichia coli [E. coli] as the cause of diseases classified elsewhere: Secondary | ICD-10-CM | POA: Diagnosis not present

## 2022-03-21 DIAGNOSIS — I739 Peripheral vascular disease, unspecified: Secondary | ICD-10-CM | POA: Diagnosis not present

## 2022-03-21 DIAGNOSIS — G8314 Monoplegia of lower limb affecting left nondominant side: Secondary | ICD-10-CM | POA: Diagnosis not present

## 2022-03-21 DIAGNOSIS — D62 Acute posthemorrhagic anemia: Secondary | ICD-10-CM | POA: Diagnosis not present

## 2022-03-21 DIAGNOSIS — I160 Hypertensive urgency: Secondary | ICD-10-CM | POA: Diagnosis not present

## 2022-03-21 DIAGNOSIS — I4510 Unspecified right bundle-branch block: Secondary | ICD-10-CM | POA: Diagnosis present

## 2022-03-21 DIAGNOSIS — I110 Hypertensive heart disease with heart failure: Secondary | ICD-10-CM | POA: Diagnosis present

## 2022-03-21 DIAGNOSIS — J9601 Acute respiratory failure with hypoxia: Secondary | ICD-10-CM | POA: Diagnosis not present

## 2022-03-21 DIAGNOSIS — K661 Hemoperitoneum: Secondary | ICD-10-CM | POA: Diagnosis not present

## 2022-03-21 DIAGNOSIS — I428 Other cardiomyopathies: Secondary | ICD-10-CM | POA: Diagnosis not present

## 2022-03-21 DIAGNOSIS — K219 Gastro-esophageal reflux disease without esophagitis: Secondary | ICD-10-CM | POA: Diagnosis not present

## 2022-03-21 DIAGNOSIS — L8990 Pressure ulcer of unspecified site, unspecified stage: Secondary | ICD-10-CM | POA: Diagnosis not present

## 2022-03-21 DIAGNOSIS — E873 Alkalosis: Secondary | ICD-10-CM | POA: Diagnosis not present

## 2022-03-21 DIAGNOSIS — R410 Disorientation, unspecified: Secondary | ICD-10-CM | POA: Diagnosis present

## 2022-03-21 DIAGNOSIS — I998 Other disorder of circulatory system: Secondary | ICD-10-CM | POA: Diagnosis not present

## 2022-03-21 DIAGNOSIS — F05 Delirium due to known physiological condition: Secondary | ICD-10-CM | POA: Diagnosis not present

## 2022-03-21 DIAGNOSIS — T502X5A Adverse effect of carbonic-anhydrase inhibitors, benzothiadiazides and other diuretics, initial encounter: Secondary | ICD-10-CM | POA: Diagnosis not present

## 2022-03-21 DIAGNOSIS — I21A1 Myocardial infarction type 2: Secondary | ICD-10-CM | POA: Diagnosis not present

## 2022-03-21 DIAGNOSIS — Z79899 Other long term (current) drug therapy: Secondary | ICD-10-CM

## 2022-03-21 DIAGNOSIS — R6510 Systemic inflammatory response syndrome (SIRS) of non-infectious origin without acute organ dysfunction: Secondary | ICD-10-CM | POA: Diagnosis not present

## 2022-03-21 DIAGNOSIS — L89159 Pressure ulcer of sacral region, unspecified stage: Secondary | ICD-10-CM | POA: Diagnosis not present

## 2022-03-21 DIAGNOSIS — R9431 Abnormal electrocardiogram [ECG] [EKG]: Secondary | ICD-10-CM | POA: Diagnosis not present

## 2022-03-21 DIAGNOSIS — L89150 Pressure ulcer of sacral region, unstageable: Secondary | ICD-10-CM | POA: Diagnosis not present

## 2022-03-21 DIAGNOSIS — K567 Ileus, unspecified: Secondary | ICD-10-CM | POA: Diagnosis not present

## 2022-03-21 DIAGNOSIS — Z87891 Personal history of nicotine dependence: Secondary | ICD-10-CM

## 2022-03-21 DIAGNOSIS — Z833 Family history of diabetes mellitus: Secondary | ICD-10-CM

## 2022-03-21 DIAGNOSIS — Z7982 Long term (current) use of aspirin: Secondary | ICD-10-CM

## 2022-03-21 DIAGNOSIS — T8131XA Disruption of external operation (surgical) wound, not elsewhere classified, initial encounter: Secondary | ICD-10-CM | POA: Diagnosis not present

## 2022-03-21 DIAGNOSIS — Z8249 Family history of ischemic heart disease and other diseases of the circulatory system: Secondary | ICD-10-CM

## 2022-03-21 DIAGNOSIS — R1310 Dysphagia, unspecified: Secondary | ICD-10-CM | POA: Diagnosis not present

## 2022-03-21 DIAGNOSIS — R32 Unspecified urinary incontinence: Secondary | ICD-10-CM | POA: Diagnosis not present

## 2022-03-21 DIAGNOSIS — E870 Hyperosmolality and hypernatremia: Secondary | ICD-10-CM | POA: Diagnosis not present

## 2022-03-21 DIAGNOSIS — I5181 Takotsubo syndrome: Secondary | ICD-10-CM | POA: Diagnosis not present

## 2022-03-21 DIAGNOSIS — I7409 Other arterial embolism and thrombosis of abdominal aorta: Secondary | ICD-10-CM | POA: Diagnosis present

## 2022-03-21 DIAGNOSIS — R570 Cardiogenic shock: Secondary | ICD-10-CM | POA: Diagnosis not present

## 2022-03-21 DIAGNOSIS — I5021 Acute systolic (congestive) heart failure: Secondary | ICD-10-CM | POA: Diagnosis not present

## 2022-03-21 DIAGNOSIS — D6489 Other specified anemias: Secondary | ICD-10-CM | POA: Diagnosis not present

## 2022-03-21 DIAGNOSIS — E876 Hypokalemia: Secondary | ICD-10-CM | POA: Diagnosis not present

## 2022-03-21 DIAGNOSIS — E861 Hypovolemia: Secondary | ICD-10-CM | POA: Diagnosis not present

## 2022-03-21 DIAGNOSIS — Z7984 Long term (current) use of oral hypoglycemic drugs: Secondary | ICD-10-CM

## 2022-03-21 DIAGNOSIS — Z8489 Family history of other specified conditions: Secondary | ICD-10-CM

## 2022-03-21 HISTORY — PX: AORTA - BILATERAL FEMORAL ARTERY BYPASS GRAFT: SHX1175

## 2022-03-21 HISTORY — PX: ANGIOPLASTY: SHX39

## 2022-03-21 HISTORY — PX: ENDARTERECTOMY FEMORAL: SHX5804

## 2022-03-21 LAB — PROTIME-INR
INR: 1.7 — ABNORMAL HIGH (ref 0.8–1.2)
Prothrombin Time: 19.7 seconds — ABNORMAL HIGH (ref 11.4–15.2)

## 2022-03-21 LAB — GLUCOSE, CAPILLARY
Glucose-Capillary: 192 mg/dL — ABNORMAL HIGH (ref 70–99)
Glucose-Capillary: 114 mg/dL — ABNORMAL HIGH (ref 70–99)
Glucose-Capillary: 190 mg/dL — ABNORMAL HIGH (ref 70–99)
Glucose-Capillary: 200 mg/dL — ABNORMAL HIGH (ref 70–99)
Glucose-Capillary: 177 mg/dL — ABNORMAL HIGH (ref 70–99)
Glucose-Capillary: 186 mg/dL — ABNORMAL HIGH (ref 70–99)

## 2022-03-21 LAB — BASIC METABOLIC PANEL
Anion gap: 12 (ref 5–15)
BUN: 7 mg/dL — ABNORMAL LOW (ref 8–23)
CO2: 18 mmol/L — ABNORMAL LOW (ref 22–32)
Calcium: 7.3 mg/dL — ABNORMAL LOW (ref 8.9–10.3)
Chloride: 107 mmol/L (ref 98–111)
Creatinine, Ser: 0.64 mg/dL (ref 0.44–1.00)
GFR, Estimated: >60 mL/min (ref >60)
Glucose, Bld: 191 mg/dL — ABNORMAL HIGH (ref 70–99)
Potassium: 3.6 mmol/L (ref 3.5–5.1)
Sodium: 137 mmol/L (ref 135–145)

## 2022-03-21 LAB — CBC
HCT: 25.4 % — ABNORMAL LOW (ref 36.0–46.0)
Hemoglobin: 8.4 g/dL — ABNORMAL LOW (ref 12.0–15.0)
MCH: 30.3 pg (ref 26.0–34.0)
MCHC: 33.1 g/dL (ref 30.0–36.0)
MCV: 91.7 fL (ref 80.0–100.0)
Platelets: 117 10*3/uL — ABNORMAL LOW (ref 150–400)
RBC: 2.77 MIL/uL — ABNORMAL LOW (ref 3.87–5.11)
RDW: 12.1 % (ref 11.5–15.5)
WBC: 15.3 10*3/uL — ABNORMAL HIGH (ref 4.0–10.5)
nRBC: 0 % (ref 0.0–0.2)

## 2022-03-21 LAB — BLOOD GAS, ARTERIAL
Acid-base deficit: 7.1 mmol/L — ABNORMAL HIGH (ref 0.0–2.0)
Bicarbonate: 18.7 mmol/L — ABNORMAL LOW (ref 20.0–28.0)
Drawn by: 5754
O2 Saturation: 97.9 %
Patient temperature: 36.5
pCO2 arterial: 37 mmHg (ref 32–48)
pH, Arterial: 7.31 — ABNORMAL LOW (ref 7.35–7.45)
pO2, Arterial: 86 mmHg (ref 83–108)

## 2022-03-21 LAB — PREPARE RBC (CROSSMATCH)

## 2022-03-21 LAB — ABO/RH: ABO/RH(D): O POS

## 2022-03-21 LAB — APTT: aPTT: 37 seconds — ABNORMAL HIGH (ref 24–36)

## 2022-03-21 LAB — MAGNESIUM: Magnesium: 1.2 mg/dL — ABNORMAL LOW (ref 1.7–2.4)

## 2022-03-21 SURGERY — CREATION, BYPASS, ARTERIAL, AORTA TO FEMORAL, BILATERAL, USING GRAFT
Anesthesia: General | Site: Groin

## 2022-03-21 MED ORDER — SODIUM CHLORIDE 0.9 % IV SOLN: INTRAVENOUS | Status: DC

## 2022-03-21 MED ORDER — HEPARIN 6000 UNIT IRRIGATION SOLUTION
Status: AC
  Filled 2022-03-21: qty 500

## 2022-03-21 MED ORDER — ACETAMINOPHEN 10 MG/ML IV SOLN
1000.0000 mg | Freq: Once | INTRAVENOUS | Status: DC | PRN
  Administered 2022-03-21: 1000 mg via INTRAVENOUS

## 2022-03-21 MED ORDER — ACETAMINOPHEN 325 MG PO TABS: 325.0000 mg | ORAL_TABLET | ORAL | Status: DC | PRN

## 2022-03-21 MED ORDER — LACTATED RINGERS IV SOLN: INTRAVENOUS | Status: DC | PRN

## 2022-03-21 MED ORDER — HYDROMORPHONE HCL 1 MG/ML IJ SOLN
INTRAMUSCULAR | Status: AC
  Filled 2022-03-21: qty 1

## 2022-03-21 MED ORDER — PROPOFOL 10 MG/ML IV BOLUS
INTRAVENOUS | Status: AC
  Filled 2022-03-21: qty 20

## 2022-03-21 MED ORDER — ROCURONIUM BROMIDE 10 MG/ML (PF) SYRINGE
PREFILLED_SYRINGE | INTRAVENOUS | Status: DC | PRN
  Administered 2022-03-21 (×2): 20 mg via INTRAVENOUS
  Administered 2022-03-21: 50 mg via INTRAVENOUS
  Administered 2022-03-21: 20 mg via INTRAVENOUS

## 2022-03-21 MED ORDER — CEFAZOLIN SODIUM-DEXTROSE 2-4 GM/100ML-% IV SOLN
2.0000 g | INTRAVENOUS | Status: AC
  Administered 2022-03-21: 2 g via INTRAVENOUS
  Filled 2022-03-21: qty 100

## 2022-03-21 MED ORDER — CHLORHEXIDINE GLUCONATE CLOTH 2 % EX PADS
6.0000 | MEDICATED_PAD | Freq: Once | CUTANEOUS | Status: DC
Start: 2022-03-21 — End: 2022-03-21

## 2022-03-21 MED ORDER — ALBUMIN HUMAN 5 % IV SOLN: INTRAVENOUS | Status: DC | PRN

## 2022-03-21 MED ORDER — PROTAMINE SULFATE 10 MG/ML IV SOLN
INTRAVENOUS | Status: AC
  Filled 2022-03-21: qty 5

## 2022-03-21 MED ORDER — AMISULPRIDE (ANTIEMETIC) 5 MG/2ML IV SOLN: 10.0000 mg | Freq: Once | INTRAVENOUS | Status: DC | PRN

## 2022-03-21 MED ORDER — SODIUM CHLORIDE 0.9% IV SOLUTION
Freq: Once | INTRAVENOUS | Status: AC
  Administered 2022-03-21: 1 mL via INTRAVENOUS

## 2022-03-21 MED ORDER — VASOPRESSIN 20 UNIT/ML IV SOLN
INTRAVENOUS | Status: AC
  Filled 2022-03-21: qty 1

## 2022-03-21 MED ORDER — PHENYLEPHRINE HCL-NACL 20-0.9 MG/250ML-% IV SOLN
INTRAVENOUS | Status: DC | PRN
Start: 2022-03-21 — End: 2022-03-21
  Administered 2022-03-21: 20 ug/min via INTRAVENOUS

## 2022-03-21 MED ORDER — SODIUM CHLORIDE 0.9 % IV SOLN
500.0000 mL | Freq: Once | INTRAVENOUS | Status: AC | PRN
  Administered 2022-03-21: 500 mL via INTRAVENOUS

## 2022-03-21 MED ORDER — MEPERIDINE HCL 25 MG/ML IJ SOLN: 6.2500 mg | INTRAMUSCULAR | Status: DC | PRN

## 2022-03-21 MED ORDER — MORPHINE SULFATE (PF) 2 MG/ML IV SOLN
2.0000 mg | INTRAVENOUS | Status: DC | PRN
  Administered 2022-03-21: 2 mg via INTRAVENOUS
  Administered 2022-03-22 (×2): 4 mg via INTRAVENOUS
  Administered 2022-03-22 (×2): 2 mg via INTRAVENOUS
  Filled 2022-03-21: qty 2
  Filled 2022-03-21: qty 1
  Filled 2022-03-21: qty 2
  Filled 2022-03-21: qty 1

## 2022-03-21 MED ORDER — MIDAZOLAM HCL 2 MG/2ML IJ SOLN
INTRAMUSCULAR | Status: AC
  Filled 2022-03-21: qty 2

## 2022-03-21 MED ORDER — CHLORHEXIDINE GLUCONATE CLOTH 2 % EX PADS: 6.0000 | MEDICATED_PAD | Freq: Once | CUTANEOUS | Status: DC

## 2022-03-21 MED ORDER — ONDANSETRON HCL 4 MG/2ML IJ SOLN
INTRAMUSCULAR | Status: DC | PRN
  Administered 2022-03-21: 4 mg via INTRAVENOUS

## 2022-03-21 MED ORDER — EPHEDRINE SULFATE (PRESSORS) 50 MG/ML IJ SOLN
INTRAMUSCULAR | Status: DC | PRN
  Administered 2022-03-21: 5 mg via INTRAVENOUS
  Administered 2022-03-21: 2.5 mg via INTRAVENOUS

## 2022-03-21 MED ORDER — MAGNESIUM SULFATE 4 GM/100ML IV SOLN
4.0000 g | Freq: Once | INTRAVENOUS | Status: AC
  Administered 2022-03-21: 4 g via INTRAVENOUS

## 2022-03-21 MED ORDER — HEMOSTATIC AGENTS (NO CHARGE) OPTIME
TOPICAL | Status: DC | PRN
  Administered 2022-03-21: 6 via TOPICAL
  Administered 2022-03-21: 1 via TOPICAL

## 2022-03-21 MED ORDER — 0.9 % SODIUM CHLORIDE (POUR BTL) OPTIME
TOPICAL | Status: DC | PRN
  Administered 2022-03-21: 3000 mL

## 2022-03-21 MED ORDER — FENTANYL CITRATE (PF) 250 MCG/5ML IJ SOLN
INTRAMUSCULAR | Status: DC | PRN
  Administered 2022-03-21 (×5): 50 ug via INTRAVENOUS

## 2022-03-21 MED ORDER — CHLORHEXIDINE GLUCONATE 0.12 % MT SOLN
15.0000 mL | Freq: Once | OROMUCOSAL | Status: AC
  Administered 2022-03-21: 15 mL via OROMUCOSAL
  Filled 2022-03-21: qty 15

## 2022-03-21 MED ORDER — PHENYLEPHRINE 80 MCG/ML (10ML) SYRINGE FOR IV PUSH (FOR BLOOD PRESSURE SUPPORT)
PREFILLED_SYRINGE | INTRAVENOUS | Status: AC
  Filled 2022-03-21: qty 20

## 2022-03-21 MED ORDER — HYDROMORPHONE HCL 1 MG/ML IJ SOLN
0.2500 mg | INTRAMUSCULAR | Status: DC | PRN
  Administered 2022-03-21 (×2): 0.25 mg via INTRAVENOUS

## 2022-03-21 MED ORDER — DEXAMETHASONE SODIUM PHOSPHATE 10 MG/ML IJ SOLN
INTRAMUSCULAR | Status: AC
  Filled 2022-03-21: qty 1

## 2022-03-21 MED ORDER — LIDOCAINE 2% (20 MG/ML) 5 ML SYRINGE
INTRAMUSCULAR | Status: DC | PRN
  Administered 2022-03-21: 40 mg via INTRAVENOUS

## 2022-03-21 MED ORDER — POTASSIUM CHLORIDE 10 MEQ/50ML IV SOLN
10.0000 meq | INTRAVENOUS | Status: AC
  Administered 2022-03-22 (×2): 10 meq via INTRAVENOUS
  Filled 2022-03-21: qty 50

## 2022-03-21 MED ORDER — PROTAMINE SULFATE 10 MG/ML IV SOLN
INTRAVENOUS | Status: DC | PRN
  Administered 2022-03-21 (×5): 10 mg via INTRAVENOUS

## 2022-03-21 MED ORDER — PROPOFOL 10 MG/ML IV BOLUS
INTRAVENOUS | Status: DC | PRN
  Administered 2022-03-21: 30 mg via INTRAVENOUS
  Administered 2022-03-21: 20 mg via INTRAVENOUS
  Administered 2022-03-21: 120 mg via INTRAVENOUS

## 2022-03-21 MED ORDER — METOPROLOL TARTRATE 5 MG/5ML IV SOLN: 2.0000 mg | INTRAVENOUS | Status: DC | PRN

## 2022-03-21 MED ORDER — MAGNESIUM SULFATE 4 GM/100ML IV SOLN
INTRAVENOUS | Status: AC
  Filled 2022-03-21: qty 100

## 2022-03-21 MED ORDER — ONDANSETRON HCL 4 MG/2ML IJ SOLN
4.0000 mg | Freq: Four times a day (QID) | INTRAMUSCULAR | Status: DC | PRN
  Administered 2022-04-10: 4 mg via INTRAVENOUS
  Filled 2022-03-21: qty 2

## 2022-03-21 MED ORDER — MIDAZOLAM HCL 5 MG/5ML IJ SOLN
INTRAMUSCULAR | Status: DC | PRN
  Administered 2022-03-21 (×2): 1 mg via INTRAVENOUS

## 2022-03-21 MED ORDER — PANTOPRAZOLE SODIUM 40 MG PO TBEC: 40.0000 mg | DELAYED_RELEASE_TABLET | Freq: Every day | ORAL | Status: DC

## 2022-03-21 MED ORDER — LACTATED RINGERS IV SOLN: INTRAVENOUS | Status: DC

## 2022-03-21 MED ORDER — POTASSIUM CHLORIDE 10 MEQ/50ML IV SOLN
10.0000 meq | INTRAVENOUS | Status: AC
  Administered 2022-03-21: 10 meq via INTRAVENOUS
  Filled 2022-03-21 (×2): qty 50

## 2022-03-21 MED ORDER — ACETAMINOPHEN 10 MG/ML IV SOLN
INTRAVENOUS | Status: AC
  Filled 2022-03-21: qty 100

## 2022-03-21 MED ORDER — HEPARIN SODIUM (PORCINE) 1000 UNIT/ML IJ SOLN
INTRAMUSCULAR | Status: AC
  Filled 2022-03-21: qty 20

## 2022-03-21 MED ORDER — HEPARIN 6000 UNIT IRRIGATION SOLUTION
Status: DC | PRN
  Administered 2022-03-21: 1

## 2022-03-21 MED ORDER — SUGAMMADEX SODIUM 200 MG/2ML IV SOLN
INTRAVENOUS | Status: DC | PRN
  Administered 2022-03-21: 50 mg via INTRAVENOUS
  Administered 2022-03-21: 100 mg via INTRAVENOUS

## 2022-03-21 MED ORDER — DOCUSATE SODIUM 100 MG PO CAPS: 100.0000 mg | ORAL_CAPSULE | Freq: Every day | ORAL | Status: DC

## 2022-03-21 MED ORDER — BISACODYL 10 MG RE SUPP
10.0000 mg | Freq: Every day | RECTAL | Status: DC | PRN
  Administered 2022-03-27: 10 mg via RECTAL
  Filled 2022-03-21 (×2): qty 1

## 2022-03-21 MED ORDER — DEXAMETHASONE SODIUM PHOSPHATE 10 MG/ML IJ SOLN
INTRAMUSCULAR | Status: DC | PRN
  Administered 2022-03-21: 5 mg via INTRAVENOUS

## 2022-03-21 MED ORDER — INSULIN ASPART 100 UNIT/ML IJ SOLN
0.0000 [IU] | INTRAMUSCULAR | Status: DC
  Administered 2022-03-22: 11 [IU] via SUBCUTANEOUS
  Administered 2022-03-22: 5 [IU] via SUBCUTANEOUS
  Administered 2022-03-22: 3 [IU] via SUBCUTANEOUS
  Administered 2022-03-22: 8 [IU] via SUBCUTANEOUS
  Administered 2022-03-22 – 2022-03-23 (×2): 5 [IU] via SUBCUTANEOUS
  Administered 2022-03-23 (×3): 3 [IU] via SUBCUTANEOUS
  Administered 2022-03-23 (×2): 5 [IU] via SUBCUTANEOUS
  Administered 2022-03-23: 3 [IU] via SUBCUTANEOUS
  Administered 2022-03-24: 8 [IU] via SUBCUTANEOUS
  Administered 2022-03-24: 3 [IU] via SUBCUTANEOUS
  Administered 2022-03-24 (×2): 5 [IU] via SUBCUTANEOUS
  Administered 2022-03-24: 2 [IU] via SUBCUTANEOUS
  Administered 2022-03-25 – 2022-03-26 (×7): 5 [IU] via SUBCUTANEOUS
  Administered 2022-03-26 (×5): 8 [IU] via SUBCUTANEOUS
  Administered 2022-03-27: 3 [IU] via SUBCUTANEOUS
  Administered 2022-03-27: 2 [IU] via SUBCUTANEOUS
  Administered 2022-03-27: 3 [IU] via SUBCUTANEOUS
  Administered 2022-03-27: 8 [IU] via SUBCUTANEOUS
  Administered 2022-03-27: 2 [IU] via SUBCUTANEOUS
  Administered 2022-03-27: 5 [IU] via SUBCUTANEOUS
  Administered 2022-03-28 (×2): 3 [IU] via SUBCUTANEOUS
  Administered 2022-03-28: 5 [IU] via SUBCUTANEOUS
  Administered 2022-03-28: 3 [IU] via SUBCUTANEOUS
  Administered 2022-03-29: 5 [IU] via SUBCUTANEOUS
  Administered 2022-03-29: 3 [IU] via SUBCUTANEOUS
  Administered 2022-03-29: 5 [IU] via SUBCUTANEOUS
  Administered 2022-03-29 (×3): 3 [IU] via SUBCUTANEOUS
  Administered 2022-03-30: 2 [IU] via SUBCUTANEOUS
  Administered 2022-03-30 (×3): 3 [IU] via SUBCUTANEOUS
  Administered 2022-03-31: 2 [IU] via SUBCUTANEOUS
  Administered 2022-03-31: 3 [IU] via SUBCUTANEOUS

## 2022-03-21 MED ORDER — MAGNESIUM SULFATE 2 GM/50ML IV SOLN: 2.0000 g | Freq: Once | INTRAVENOUS | Status: DC | PRN

## 2022-03-21 MED ORDER — SODIUM CHLORIDE 0.9% IV SOLUTION
Freq: Once | INTRAVENOUS | Status: DC
Start: 2022-03-21 — End: 2022-03-25

## 2022-03-21 MED ORDER — PHENYLEPHRINE HCL (PRESSORS) 10 MG/ML IV SOLN
INTRAVENOUS | Status: DC | PRN
  Administered 2022-03-21 (×5): 80 ug via INTRAVENOUS
  Administered 2022-03-21: 40 ug via INTRAVENOUS
  Administered 2022-03-21: 80 ug via INTRAVENOUS
  Administered 2022-03-21 (×2): 40 ug via INTRAVENOUS

## 2022-03-21 MED ORDER — ACETAMINOPHEN 160 MG/5ML PO SOLN: 325.0000 mg | Freq: Once | ORAL | Status: DC | PRN

## 2022-03-21 MED ORDER — FENTANYL CITRATE (PF) 250 MCG/5ML IJ SOLN
INTRAMUSCULAR | Status: AC
  Filled 2022-03-21: qty 5

## 2022-03-21 MED ORDER — ONDANSETRON HCL 4 MG/2ML IJ SOLN
INTRAMUSCULAR | Status: AC
  Filled 2022-03-21: qty 2

## 2022-03-21 MED ORDER — PHENOL 1.4 % MT LIQD: 1.0000 | OROMUCOSAL | Status: DC | PRN

## 2022-03-21 MED ORDER — PANTOPRAZOLE SODIUM 40 MG IV SOLR
40.0000 mg | Freq: Every day | INTRAVENOUS | Status: DC
  Administered 2022-03-21 – 2022-04-08 (×19): 40 mg via INTRAVENOUS
  Filled 2022-03-21 (×19): qty 10

## 2022-03-21 MED ORDER — CHLORHEXIDINE GLUCONATE CLOTH 2 % EX PADS
6.0000 | MEDICATED_PAD | Freq: Every day | CUTANEOUS | Status: DC
  Administered 2022-03-22 – 2022-05-01 (×45): 6 via TOPICAL

## 2022-03-21 MED ORDER — LABETALOL HCL 5 MG/ML IV SOLN: 10.0000 mg | INTRAVENOUS | Status: DC | PRN

## 2022-03-21 MED ORDER — CEFAZOLIN SODIUM-DEXTROSE 2-4 GM/100ML-% IV SOLN
2.0000 g | Freq: Three times a day (TID) | INTRAVENOUS | Status: AC
  Administered 2022-03-22: 2 g via INTRAVENOUS
  Filled 2022-03-21: qty 100

## 2022-03-21 MED ORDER — GUAIFENESIN-DM 100-10 MG/5ML PO SYRP: 15.0000 mL | ORAL_SOLUTION | ORAL | Status: DC | PRN

## 2022-03-21 MED ORDER — HYDRALAZINE HCL 20 MG/ML IJ SOLN: 5.0000 mg | INTRAMUSCULAR | Status: DC | PRN

## 2022-03-21 MED ORDER — INSULIN ASPART 100 UNIT/ML IJ SOLN
0.0000 [IU] | INTRAMUSCULAR | Status: AC | PRN
  Administered 2022-03-21 (×2): 4 [IU] via SUBCUTANEOUS
  Administered 2022-03-21: 2 [IU] via SUBCUTANEOUS
  Filled 2022-03-21: qty 1

## 2022-03-21 MED ORDER — ROCURONIUM BROMIDE 10 MG/ML (PF) SYRINGE
PREFILLED_SYRINGE | INTRAVENOUS | Status: AC
  Filled 2022-03-21: qty 20

## 2022-03-21 MED ORDER — ORAL CARE MOUTH RINSE: 15.0000 mL | Freq: Once | OROMUCOSAL | Status: AC

## 2022-03-21 MED ORDER — HEPARIN SODIUM (PORCINE) 1000 UNIT/ML IJ SOLN
INTRAMUSCULAR | Status: DC | PRN
  Administered 2022-03-21: 2000 [IU] via INTRAVENOUS
  Administered 2022-03-21: 5000 [IU] via INTRAVENOUS
  Administered 2022-03-21: 1000 [IU] via INTRAVENOUS
  Administered 2022-03-21 (×3): 2000 [IU] via INTRAVENOUS

## 2022-03-21 MED ORDER — HEPARIN SODIUM (PORCINE) 5000 UNIT/ML IJ SOLN
5000.0000 [IU] | Freq: Three times a day (TID) | INTRAMUSCULAR | Status: DC
  Administered 2022-03-23: 5000 [IU] via SUBCUTANEOUS
  Filled 2022-03-21: qty 1

## 2022-03-21 MED ORDER — LIDOCAINE 2% (20 MG/ML) 5 ML SYRINGE
INTRAMUSCULAR | Status: AC
  Filled 2022-03-21: qty 5

## 2022-03-21 MED ORDER — ACETAMINOPHEN 325 MG PO TABS: 325.0000 mg | ORAL_TABLET | Freq: Once | ORAL | Status: DC | PRN

## 2022-03-21 MED ORDER — ACETAMINOPHEN 325 MG RE SUPP: 325.0000 mg | RECTAL | Status: DC | PRN

## 2022-03-21 SURGICAL SUPPLY — 68 items
BAG COUNTER SPONGE SURGICOUNT (BAG) ×1 IMPLANT
BAG ISOLATION DRAPE 18X18 (DRAPES) ×6 IMPLANT
CANISTER SUCT 3000ML PPV (MISCELLANEOUS) ×4 IMPLANT
CATH EMB 3FR 40CM (CATHETERS) ×1 IMPLANT
CATH EMB 4FR 40CM (CATHETERS) ×1 IMPLANT
CLIP LIGATING EXTRA MED SLVR (CLIP) ×4 IMPLANT
CLIP LIGATING EXTRA SM BLUE (MISCELLANEOUS) ×8 IMPLANT
COVER PROBE W GEL 5X96 (DRAPES) ×2 IMPLANT
DERMABOND ADVANCED (GAUZE/BANDAGES/DRESSINGS) ×3
DERMABOND ADVANCED .7 DNX12 (GAUZE/BANDAGES/DRESSINGS) ×9 IMPLANT
DRAPE ISOLATION BAG 18X18 (DRAPES)
DRAPE SPLIT 6X30 W/TAPE (DRAPES) ×1 IMPLANT
ELECT BLADE 4.0 EZ CLEAN MEGAD (MISCELLANEOUS)
ELECT BLADE 6.5 EXT (BLADE) IMPLANT
ELECT REM PT RETURN 9FT ADLT (ELECTROSURGICAL) ×4
ELECTRODE BLDE 4.0 EZ CLN MEGD (MISCELLANEOUS) IMPLANT
ELECTRODE REM PT RTRN 9FT ADLT (ELECTROSURGICAL) ×3 IMPLANT
FELT TEFLON 1X6 (MISCELLANEOUS) ×1 IMPLANT
GLOVE BIO SURGEON STRL SZ7.5 (GLOVE) ×4 IMPLANT
GLOVE BIOGEL PI IND STRL 8 (GLOVE) ×3 IMPLANT
GLOVE BIOGEL PI INDICATOR 8 (GLOVE) ×1
GLOVE SRG 8 PF TXTR STRL LF DI (GLOVE) ×3 IMPLANT
GLOVE SURG POLYISO LF SZ8 (GLOVE) IMPLANT
GLOVE SURG UNDER POLY LF SZ8 (GLOVE) ×4
GOWN STRL REUS W/ TWL LRG LVL3 (GOWN DISPOSABLE) ×9 IMPLANT
GOWN STRL REUS W/TWL 2XL LVL3 (GOWN DISPOSABLE) ×4 IMPLANT
GOWN STRL REUS W/TWL LRG LVL3 (GOWN DISPOSABLE) ×12
GRAFT HEMASHIELD 14X7MM (Vascular Products) ×1 IMPLANT
HEMOSTAT SNOW SURGICEL 2X4 (HEMOSTASIS) ×5 IMPLANT
INSERT FOGARTY 61MM (MISCELLANEOUS) ×3 IMPLANT
INSERT FOGARTY SM (MISCELLANEOUS) ×10 IMPLANT
KIT BASIN OR (CUSTOM PROCEDURE TRAY) ×4 IMPLANT
KIT TURNOVER KIT B (KITS) ×4 IMPLANT
LOOP VESSEL MAXI BLUE (MISCELLANEOUS) ×1 IMPLANT
LOOP VESSEL MINI RED (MISCELLANEOUS) ×2 IMPLANT
NS IRRIG 1000ML POUR BTL (IV SOLUTION) ×12 IMPLANT
PACK AORTA (CUSTOM PROCEDURE TRAY) ×4 IMPLANT
PAD ARMBOARD 7.5X6 YLW CONV (MISCELLANEOUS) ×8 IMPLANT
PATCH VASC XENOSURE 1CMX6CM (Vascular Products) ×4 IMPLANT
PATCH VASC XENOSURE 1X6 (Vascular Products) IMPLANT
PENCIL BUTTON HOLSTER BLD 10FT (ELECTRODE) ×1 IMPLANT
POWDER SURGICEL 3.0 GRAM (HEMOSTASIS) ×1 IMPLANT
RETAINER VISCERA MED (MISCELLANEOUS) ×5 IMPLANT
SPONGE T-LAP 18X18 ~~LOC~~+RFID (SPONGE) ×5 IMPLANT
STOPCOCK 4 WAY LG BORE MALE ST (IV SETS) ×1 IMPLANT
SUT ETHIBOND 5 LR DA (SUTURE) IMPLANT
SUT MNCRL AB 4-0 PS2 18 (SUTURE) ×16 IMPLANT
SUT PDS AB 1 TP1 96 (SUTURE) ×8 IMPLANT
SUT PROLENE 3 0 SH 48 (SUTURE) ×3 IMPLANT
SUT PROLENE 3 0 SH1 36 (SUTURE) ×8 IMPLANT
SUT PROLENE 5 0 C 1 24 (SUTURE) ×8 IMPLANT
SUT PROLENE 5 0 C 1 36 (SUTURE) IMPLANT
SUT PROLENE 6 0 BV (SUTURE) ×6 IMPLANT
SUT SILK 2 0 (SUTURE) ×4
SUT SILK 2 0 SH CR/8 (SUTURE) ×4 IMPLANT
SUT SILK 2-0 18XBRD TIE 12 (SUTURE) ×3 IMPLANT
SUT SILK 4 0 (SUTURE) ×4
SUT SILK 4-0 18XBRD TIE 12 (SUTURE) ×3 IMPLANT
SUT VIC AB 2-0 CT1 27 (SUTURE) ×16
SUT VIC AB 2-0 CT1 TAPERPNT 27 (SUTURE) ×12 IMPLANT
SUT VIC AB 3-0 SH 27 (SUTURE) ×24
SUT VIC AB 3-0 SH 27X BRD (SUTURE) ×18 IMPLANT
SYR 3ML LL SCALE MARK (SYRINGE) ×1 IMPLANT
SYR BULB IRRIG 60ML STRL (SYRINGE) ×1 IMPLANT
TOWEL GREEN STERILE (TOWEL DISPOSABLE) ×4 IMPLANT
TOWEL ~~LOC~~+RFID 17X26 BLUE (SPONGE) ×4 IMPLANT
TRAY FOLEY MTR SLVR 16FR STAT (SET/KITS/TRAYS/PACK) ×4 IMPLANT
WATER STERILE IRR 1000ML POUR (IV SOLUTION) ×8 IMPLANT

## 2022-03-21 NOTE — Op Note (Signed)
? ? ?NAME: Gabrielle [name]    MRN: [id number] ?DOB: 1956/05/02    DATE OF OPERATION: 03/21/2022 ? ?PREOP DIAGNOSIS:   ? ?Right lower extremity Rutherford 4 critical limb ischemia ?Left lower extremity Rutherford 3 critical limb ischemia ? ?POSTOP DIAGNOSIS:   ? ?Same ? ?PROCEDURE:  ?  ?Aortobifemoral bypass using 14 x 7 bifurcated Dacron graft ?Bilateral femoral endarterectomy,  ?Left-sided profunda endarterectomy with bovine pericardial patch ? ?SURGEON: Broadus John ? ?ASSIST: Dr. Deitra Mayo, MD ? ?ANESTHESIA: General ? ?EBL: 1.5 L ? ?INDICATIONS:  ? ?Gabrielle [name] is a 66 y.o. female presenting with rest pain in the right leg, lifestyle limiting claudication in the left leg.  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  At her young age, endovascular repair would not be a durable option and would still require right common femoral endarterectomy.  Gabrielle [name] would be best served with aortobifemoral bypass surgery.  I had a long discussion with her regarding this and regarding the risks and benefits which include death infection, bleeding, need for further surgery, hernia, enterotomy, wound healing concerns, ureteral injury, damage to adjacent structures, bypass thrombosis. ?After discussing the above, Gabrielle [name] elected to proceed. ? ?FINDINGS:  ? ?Severely diseased infrarenal abdominal aorta, severely diseased bilateral common femoral arteries extending into the profunda bilaterally superficial femoral arteries occluded bilaterally ? ?TECHNIQUE:  ? ?Patient was brought to the OR laid in supine position.  She was prepped and draped in standard fashion a timeout was performed.  The case began with ultrasound insonation of bilateral common femoral arteries, specifically the  bifurcations.  These were marked and oblique incisions were made to expose bilateral common femoral arteries, profunda, superficial femoral artery, external iliac artery.  These were controlled with the use of Vesseloops..  The crossing vein was ligated over the distal external iliac arteries bilaterally.  Next, tunneling tracts were made using blunt dissection from the groins into the pelvis. ? ?Next, my attention turned to the abdomen.  A midline laparotomy incision was made, followed by standard inframesocolic exposure of the infrarenal abdominal aorta.  Bilateral renal arteries were identified.  An umbilical tape was used to control the infrarenal abdominal aorta directly inferior to the bilateral renal arteries.  Next, my attention turned distally to the iliac bifurcation.  Tunneling tracts were made bilaterally using blunt dissection immediately anterior to the external iliac arteries to ensure the ureters were superficial to the tunneling tracts.  Once completed, these tunneling tracts were held in place using umbilical tape. ? ?Once the infrarenal abdominal aorta was exposed and bilateral common femoral arteries, a 14 x 7 mm bifurcated dacryon graft was brought onto the field.  The patient was heparinized with 5000 units IV heparin to an ACT greater than 250.  The infrarenal abdominal aorta was clamped proximal to the inferior mesenteric artery, then again, distal to the renal arteries.  The aorta was transected.  The graft was cut to length with a short main body, and sewn in end-to-end fashion to the infrarenal abdominal aorta using 3-0 Prolene suture.  Next, attention turned to bilateral common femoral arteries.  The umbilical tape was used to mark the retroperitoneal tunnels were tied to the limbs of the bypass graft and pulled through the tunnel.  Special care was taken to ensure there was no kinking or twisting. ? ?Bilateral common femoral  arteries were controlled, and longitudinal arteriotomy was  made.  There was severe disease bilaterally requiring extensive endarterectomies.  To the right sided endarterectomy extended down the profunda, and required patch plasty prior to end-to-side anastomosis of the aortobifemoral bypass limb.  The patch plasty was sewn using 6-0 Prolene suture with the aorto bypass limb sewn using 5-0 Prolene suture.  On the right, a patch was not needed, and to the right sided bypass limb was sewn in end-to-side fashion using running 5-0 Prolene suture.  Prior to completion of bilateral limbs, arteries were backbled to ensure there was no thrombus.  A Doppler signal was brought onto the field after completion with excellent signals in the profundas bilaterally.  Monophasic posterior tibial signals were appreciated bilaterally. ? ?The abdomen was explored again to ensure there was no bleeding.  Hemostasis was achieved with use of cautery, thrombin, suture.  Bilateral groins were closed using 2-0 Vicryl suture in layers with Monocryl and Dermabond at the level of the skin.  The abdomen was closed with looped 1-0 PDS suture, followed by 3-0 Vicryl and Monocryl at the level of the skin. ? ?Given the complexity of the case,  the assistant was necessary in order to expedient the procedure and safely perform the technical aspects of the operation.  The assistant provided traction and countertraction to assist with exposure of the artery proximally and distally.  They assisted with suture ligature of multiple branches.  Their assistance was critical in the performance of both the proximal and distal anastomosis.These skills, especially following the Prolene suture for the anastomosis, could not have been adequately performed by a scrub tech assistant. ? ?Macie Burows, MD ?[facility] ?DATE OF DICTATION:   03/21/2022 ? ?

## 2022-03-21 NOTE — Progress Notes (Signed)
Received from PACU. Connected to bedside monitors Room air. Full assessment to follow. Abdomen distended, MD aware. SBP 97 at this time.  ?

## 2022-03-21 NOTE — Transfer of Care (Signed)
Immediate Anesthesia Transfer of Care Note ? ?Patient: Gabrielle [name] ? ?Procedure(s) Performed: AORTOBIFEMORAL BYPASS GRAFT USING HEMASHIELD GOLD GRAFT (14x67m) (Abdomen) ?ENDARTERECTOMY FEMORAL BILATERAL (Bilateral: Groin) ?ANGIOPLASTY USING XENOSURE BIOLOGLIC PATCH (12MEB5AX (Left: Groin) ? ?Patient Location: PACU ? ?Anesthesia Type:General ? ?Level of Consciousness: awake, alert , patient cooperative and responds to stimulation ? ?Airway & Oxygen Therapy: Patient Spontanous Breathing and Patient connected to face mask oxygen ? ?Post-op Assessment: Report given to RN and Post -op Vital signs reviewed and stable ? ?Post vital signs: Reviewed and stable ? ?Last Vitals:  ?Vitals Value Taken Time  ?BP 95/62 03/21/22 1322  ?Temp    ?Pulse 93 03/21/22 1324  ?Resp    ?SpO2 100 % 03/21/22 1324  ?Vitals shown include unvalidated device data. ? ?Last Pain:  ?Vitals:  ? 03/21/22 0636  ?TempSrc:   ?PainSc: 0-No pain  ?   ? ?  ? ?Complications: No notable events documented. ?

## 2022-03-21 NOTE — H&P (Signed)
?Office Note  ? ? ? ?HPI: Gabrielle [name] is a 66 y.o. (Mar 27, 1956) female presenting) today for aortobifemoral bypass due to right-sided Rutherford 4 critical limb ischemia, left-sided Rutherford 3 critical limb ischemia.   ? ?In short, Gabrielle [name] was initially seen in clinic at the request of Dr. Jacqualyn Posey DPM, for bilateral lower extremity claudication right greater than left.  Gabrielle [name] works full-time at [employer] in [place].  Over the last several months she is appreciated significant claudication symptoms in bilateral lower extremities that is affected her time at work.  She has difficulty ambulating from her desk to the restroom.  At her home, she limits activity to forego pain in bilateral calves.   ? ?Since her initial presentation, her bilateral lower extremity pain has continued to worsen.  She continues to have rest pain in the right leg, keeping her up at night.  Basic over-the-counter medications are not working to alleviate the pain she is unable to sleep, and it is affecting her work.  At work, she has severe bilateral lower extremity claudication, which is hampering her productivity.  She can no longer go out with friends, and has limited social life due to the pain. ? ?The pt is  on a statin for cholesterol management.  ?The pt is  on a daily aspirin.   Other AC:  - ?The pt is  on medication for hypertension.   ?The pt is  diabetic.  ?Tobacco hx:  - ? ?Past Medical History:  ?Diagnosis Date  ? Cancer [facility]) 2010  ? rectal CA  ? Cataract   ? Diabetes mellitus without complication ([place])   ? Phreesia 11/15/2020  ? Eczema   ? Hyperlipidemia   ? Hypertension   ? Intrinsic (urethral) sphincter deficiency (ISD)   ? RBBB   ? SUI (stress urinary incontinence, female)   ? hx of  ? Type 2 diabetes mellitus ()   ? Wears dentures   ? UPPER  ? Wears glasses   ? ? ?Past Surgical History:  ?Procedure Laterality Date  ? ABDOMINAL HYSTERECTOMY  1998  ? fibroids, partial  ? ABDOMINOPLASTY  1999  ? BLADDER SUSPENSION N/A  03/06/2014  ? Procedure: [facility] SLING ;  Surgeon: Irine Seal, MD;  Location: [facility];  Service: Urology;  Laterality: N/A;  ? CARDIOVASCULAR STRESS TEST  03-27-2008  ? NORMAL PERFUSION STUDY/  EF 54%  ? COLON SURGERY  2010  ? for rectal ca  ? COLONOSCOPY    ? EUS N/A 03/02/2017  ? Procedure: LOWER ENDOSCOPIC ULTRASOUND (EUS);  Surgeon: Milus Banister, MD;  Location: [facility];  Service: Endoscopy;  Laterality: N/A;  ? EXCISION LOWER ABDOMINAL SCAR POST ABDOMINOPLASTY  01-05-2000  ? ORIF RIGHT ANKLE FX  12-24-2008  ? RIGHT ANKLE ARTHROSCOPY W/ DEBRIDEMENT AND OPEN REMOVAL HARDWARE  03-12-2010  ? TRANSTHORACIC ECHOCARDIOGRAM  08-05-2009  ? MILD LVH/  EF 60-65%  ? TUBAL LIGATION    ? ? ?Social History  ? ?Socioeconomic History  ? Marital status: Married  ?  Spouse name: Not on file  ? Number of children: 1  ? Years of education: Not on file  ? Highest education level: Not on file  ?Occupational History  ? Not on file  ?Tobacco Use  ? Smoking status: Former  ?  Years: 20.00  ?  Types: Cigarettes  ?  Quit date: 11/14/2006  ?  Years since quitting: 15.3  ? Smokeless tobacco: Never  ?Vaping Use  ? Vaping Use: Never  used  ?Substance and Sexual Activity  ? Alcohol use: No  ? Drug use: No  ? Sexual activity: Not on file  ?Other Topics Concern  ? Not on file  ?Social History Narrative  ? Not on file  ? ?Social Determinants of Health  ? ?Financial Resource Strain: Not on file  ?Food Insecurity: Not on file  ?Transportation Needs: Not on file  ?Physical Activity: Not on file  ?Stress: Not on file  ?Social Connections: Not on file  ?Intimate Partner Violence: Not on file  ? ? ?Family History  ?Problem Relation Age of Onset  ? Diabetes Mother   ? Hypertension Mother   ? Diabetes Sister   ? Hypertension Sister   ? Diabetes Brother   ? Hypertension Brother   ? Lupus Daughter   ? Colon cancer Neg Hx   ? Esophageal cancer Neg Hx   ? Rectal cancer Neg Hx   ? Stomach cancer Neg Hx   ? Colon polyps Neg Hx   ? ? ?Current  Facility-Administered Medications  ?Medication Dose Route Frequency Provider Last Rate Last Admin  ? 0.9 %  sodium chloride infusion (Manually program via Guardrails IV Fluids)   Intravenous Once Holtzman, Ariel Leffew, CRNA      ? 0.9 %  sodium chloride infusion   Intravenous Continuous Broadus John, MD      ? ceFAZolin (ANCEF) IVPB 2g/100 mL premix  2 g Intravenous 30 min Pre-Op Broadus John, MD      ? Chlorhexidine Gluconate Cloth 2 % PADS 6 each  6 each Topical Once Broadus John, MD      ? And  ? Chlorhexidine Gluconate Cloth 2 % PADS 6 each  6 each Topical Once Broadus John, MD      ? insulin aspart (novoLOG) injection 0-14 Units  0-14 Units Subcutaneous Q2H PRN Effie Berkshire, MD   4 Units at 03/21/22 9678  ? lactated ringers infusion   Intravenous Continuous Oleta Mouse, MD      ? ?Facility-Administered Medications Ordered in Other Encounters  ?Medication Dose Route Frequency Provider Last Rate Last Admin  ? fentaNYL citrate (PF) (SUBLIMAZE) injection   Intravenous Anesthesia Intra-op Holtzman, Ariel Melanee Left, CRNA   50 mcg at 03/21/22 0657  ? lactated ringers infusion   Intravenous Continuous PRN Holtzman, Dahlia Bailiff, CRNA   New Bag at 03/21/22 [phone]  ? midazolam (VERSED) 5 MG/5ML injection   Intravenous Anesthesia Intra-op Holtzman, Ariel Leffew, CRNA   1 mg at 03/21/22 0657  ? ? ?No Known Allergies ? ? ?REVIEW OF SYSTEMS:  ? ?'[X]'$  denotes positive finding, '[ ]'$  denotes negative finding ?Cardiac  Comments:  ?Chest pain or chest pressure:    ?Shortness of breath upon exertion:    ?Short of breath when lying flat:    ?Irregular heart rhythm:    ?    ?Vascular    ?Pain in calf, thigh, or hip brought on by ambulation: X   ?Pain in feet at night that wakes you up from your sleep:     ?Blood clot in your veins:    ?Leg swelling:     ?    ?Pulmonary    ?Oxygen at home:    ?Productive cough:     ?Wheezing:     ?    ?Neurologic    ?Sudden weakness in arms or legs:     ?Sudden numbness in arms or  legs:     ?Sudden onset of difficulty speaking  or slurred speech:    ?Temporary loss of vision in one eye:     ?Problems with dizziness:     ?    ?Gastrointestinal    ?Blood in stool:     ?Vomited blood:     ?    ?Genitourinary    ?Burning when urinating:     ?Blood in urine:    ?    ?Psychiatric    ?Major depression:     ?    ?Hematologic    ?Bleeding problems:    ?Problems with blood clotting too easily:    ?    ?Skin    ?Rashes or ulcers:    ?    ?Constitutional    ?Fever or chills:    ? ? ?PHYSICAL EXAMINATION: ? ?Vitals:  ? 03/21/22 0548  ?BP: (!) 155/74  ?Pulse: 97  ?Resp: 18  ?Temp: 98.1 ?F (36.7 ?C)  ?TempSrc: Oral  ?SpO2: 98%  ?Weight: 53.5 kg  ?Height: '4\' 8"'$  (1.422 m)  ? ? ?General:  WDWN in NAD; vital signs documented above ?Gait: Not observed ?HENT: WNL, normocephalic ?Pulmonary: normal non-labored breathing ?Cardiac: regular HR, ?Abdomen: soft, NT, no masses ?Skin: without rashes ?Vascular Exam/Pulses: ? Right Left  ?Radial 2+ (normal) 2+ (normal)  ?Ulnar 2+ (normal) 2+ (normal)  ?Femoral nonpalpable nonpalpable  ?Popliteal    ?DP nonpalpable nonpalpable  ?PT nonpalpable nonpalpable  ? ?Extremities: without ischemic changes, without Gangrene , without cellulitis; without open wounds;  ?Musculoskeletal: no muscle wasting or atrophy  ?Neurologic: A&O X 3;  No focal weakness or paresthesias are detected ?Psychiatric:  The pt has Normal affect. ? ? ?Non-Invasive Vascular Imaging:   ? ? ? ? ?ASSESSMENT/PLAN: Gabrielle [name] is a 66 y.o. female presenting with rest pain in the right leg, lifestyle limiting claudication in the left leg.  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 ? 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  At her young age, endovascular repair would not be a durable option and would still require right  common femoral endarterectomy.  Gabrielle [name] would be best served with aortobifemoral bypass surgery.  I had a long discussion with her regarding this and regarding the risks and benefits which include death i

## 2022-03-21 NOTE — Progress Notes (Signed)
Patient's CBG 192 spoke with Dr. Smith Robert and will continue with protocol.  ?

## 2022-03-21 NOTE — Anesthesia Postprocedure Evaluation (Signed)
Anesthesia Post Note ? ?Patient: SONAM WANDEL ? ?Procedure(s) Performed: AORTOBIFEMORAL BYPASS GRAFT USING HEMASHIELD GOLD GRAFT (14x73m) (Abdomen) ?ENDARTERECTOMY FEMORAL BILATERAL (Bilateral: Groin) ?ANGIOPLASTY USING XENOSURE BIOLOGLIC PATCH (14YCX4GY (Left: Groin) ? ?  ? ?Patient location during evaluation: PACU ?Anesthesia Type: General ?Level of consciousness: awake and alert and oriented ?Pain management: pain level controlled ?Vital Signs Assessment: post-procedure vital signs reviewed and stable ?Respiratory status: spontaneous breathing, nonlabored ventilation and respiratory function stable ?Cardiovascular status: blood pressure returned to baseline and stable ?Postop Assessment: no apparent nausea or vomiting ?Anesthetic complications: no ?Comments: Bilateral DP/PT pulses heard with doppler.  ? ? ?No notable events documented. ? ?Last Vitals:  ?Vitals:  ? 03/21/22 1422 03/21/22 1436  ?BP: (!) 84/62 (!) 80/54  ?Pulse: 84 87  ?Resp: (!) 21 (!) 28  ?Temp:    ?SpO2: 98% 98%  ?  ?Last Pain:  ?Vitals:  ? 03/21/22 1422  ?TempSrc:   ?PainSc: 6   ? ? ?  ?  ?  ?  ?  ?  ? ?Madasyn Heath A. ? ? ? ? ?

## 2022-03-21 NOTE — Anesthesia Procedure Notes (Signed)
Procedure Name: Intubation ?Date/Time: 03/21/2022 7:45 AM ?Performed by: Glynda Jaeger, CRNA ?Pre-anesthesia Checklist: Patient identified, Patient being monitored, Timeout performed, Emergency Drugs available and Suction available ?Patient Re-evaluated:Patient Re-evaluated prior to induction ?Oxygen Delivery Method: Circle System Utilized ?Preoxygenation: Pre-oxygenation with 100% oxygen ?Induction Type: IV induction ?Ventilation: Mask ventilation without difficulty ?Laryngoscope Size: Mac and 3 ?Grade View: Grade I ?Tube type: Oral ?Tube size: 7.5 mm ?Number of attempts: 1 ?Airway Equipment and Method: Stylet ?Placement Confirmation: ETT inserted through vocal cords under direct vision, positive ETCO2 and breath sounds checked- equal and bilateral ?Secured at: 21 cm ?Tube secured with: Tape ?Dental Injury: Teeth and Oropharynx as per pre-operative assessment  ? ? ? ? ?

## 2022-03-21 NOTE — Discharge Instructions (Signed)
 [facility] [place]  Discharge Instructions   Open Aortic Surgery  Please refer to the following instructions for your post-procedure care. Your surgeon or Physician Assistant will discuss any changes with you.  Activity  Avoid lifting more than eight pounds (a gallon of milk) until after your first post-operative visit. You are encouraged to walk as much as you can. You can slowly return to normal activities but must avoid strenuous activity and heavy lifting until your doctor tells you it's okay. Heavy lifting can hurt the incision and cause a hernia. Avoid activities such as vacuuming or swinging a golf club. It is normal to feel tired for several weeks after your surgery. Do not drive until your doctor gives the okay and you are no longer taking prescription pain medications. It is also normal to have difficulty with sleep habits, eating and bowl movements after surgery. These will go away with time.  Bathing/Showering  Shower daily after you go home. Do not soak in a bathtub, hot tub, or swim until the incision heals.  Incision Care  Shower every day. Clean your incision with mild soap and water. Pat the area dry with a clean towel. You do not need a bandage unless otherwise instructed. Do not apply any ointments or creams to your incision. You may have skin glue on your incision. Do not peel it off. It will come off on its own in about one week. If you have staples or sutures along your incision, they will be removed at your post op appointment.  If you have groin incisions, wash the groin wounds with soap and water daily and pat dry. (No tub bath-only shower)  Then put a dry gauze or washcloth in the groin to keep this area dry to help prevent wound infection.  Do this daily and as needed.  Do not use Vaseline or neosporin on your incisions.  Only use soap and water on your incisions and then protect and keep dry.  Diet  Resume your normal diet. There are no  special food restriction following this procedure. A low fat/low cholesterol diet is recommended for all patients with vascular disease. After your aortic surgery, it's normal to feel full faster than usual and to not feel as hungry as you normally would. You will probably lose weight initially following your surgery. It's best to eat small, frequent meals over the course of the day. Call the office if you find that you are unable to eat even small meals.   In order to heal from your surgery, it is CRITICAL to get adequate nutrition. Your body requires vitamins, minerals, and protein. Vegetables are the best source of vitamins and minerals. If you have pain, you may take over-the-counter pain reliever such as acetaminophen (Tylenol). If you were prescribed a stronger pain medication, please be aware these medication can cause nausea and constipation. Prevent nausea by taking the medication with a snack or meal. Avoid constipation by drinking plenty of fluids and eating foods with a high amount of fiber, such as fruits, vegetables and grains. Take 100mg of the over-the-counter stool softener Colace twice a day as needed to help with constipation. A laxative, such as Milk of Magnesia, may be recommended for you at this time. Do not take a laxative unless your surgeon or P.A. tells you it's OK.  Do not take Tylenol if you are taking stronger pain medications (such as Percocet).  Follow Up  Our office will schedule a follow up   appointment 2-3 weeks after discharge.  Please call us immediately for any of the following conditions    .     Severe or worsening pain in your legs or feet or in your abdomen back or chest. Increased pain, redness drainage (pus) from your incision site. Increased abdominal pain, bloating, nausea, vomiting, or persistent diarrhea. Fever of 101 degrees or higher. Swelling in your leg (s).  Reduce your risk of vascular disease  Stop smoking. If you would like help, call  [facility] at 1-800-QUIT-NOW (1-800-784-8669) or [place] at [phone]. Manage your cholesterol Maintain a desired weight Control your diabetes Keep your blood pressure down  If you have any questions please call the office at [phone].   

## 2022-03-21 NOTE — Anesthesia Procedure Notes (Signed)
Central Venous Catheter Insertion ?Performed by: Effie Berkshire, MD, anesthesiologist ?Start/End5/06/2022 7:00 AM, 03/21/2022 7:10 AM ?Patient location: Pre-op. ?Preanesthetic checklist: patient identified, IV checked, site marked, risks and benefits discussed, surgical consent, monitors and equipment checked, pre-op evaluation, timeout performed and anesthesia consent ?Position: Trendelenburg ?Lidocaine 1% used for infiltration and patient sedated ?Hand hygiene performed , maximum sterile barriers used  and Seldinger technique used ?Catheter size: 8 Fr ?Total catheter length 16. ?Central line was placed.Double lumen ?Procedure performed using ultrasound guided technique. ?Ultrasound Notes:anatomy identified, needle tip was noted to be adjacent to the nerve/plexus identified, no ultrasound evidence of intravascular and/or intraneural injection and image(s) printed for medical record ?Attempts: 1 ?Following insertion, dressing applied, line sutured and Biopatch. ?Post procedure assessment: blood return through all ports ? ?Patient tolerated the procedure well with no immediate complications. ? ? ? ? ?

## 2022-03-21 NOTE — Anesthesia Procedure Notes (Signed)
Arterial Line Insertion ?Start/End5/06/2022 6:50 AM, 03/21/2022 6:54 AM ?Performed by: Effie Berkshire, MD, Adelyna Brockman Melanee Left, CRNA, CRNA ? Preanesthetic checklist: patient identified, IV checked, site marked, risks and benefits discussed, surgical consent, monitors and equipment checked, pre-op evaluation, timeout performed and anesthesia consent ?Lidocaine 1% used for infiltration and patient sedated ?Left, radial was placed ?Catheter size: 20 G ? ?Attempts: 1 ?Procedure performed without using ultrasound guided technique. ?Following insertion, dressing applied and Biopatch. ?Post procedure assessment: normal ? ?Patient tolerated the procedure well with no immediate complications. ? ? ?

## 2022-03-22 ENCOUNTER — Inpatient Hospital Stay (HOSPITAL_COMMUNITY): Payer: BC Managed Care – PPO

## 2022-03-22 ENCOUNTER — Encounter (HOSPITAL_COMMUNITY): Payer: Self-pay | Admitting: Vascular Surgery

## 2022-03-22 DIAGNOSIS — E785 Hyperlipidemia, unspecified: Secondary | ICD-10-CM

## 2022-03-22 DIAGNOSIS — I739 Peripheral vascular disease, unspecified: Secondary | ICD-10-CM

## 2022-03-22 DIAGNOSIS — I428 Other cardiomyopathies: Secondary | ICD-10-CM | POA: Diagnosis not present

## 2022-03-22 DIAGNOSIS — I160 Hypertensive urgency: Secondary | ICD-10-CM

## 2022-03-22 LAB — COMPREHENSIVE METABOLIC PANEL
ALT: 16 U/L (ref 0–44)
ALT: 14 U/L (ref 0–44)
AST: 27 U/L (ref 15–41)
AST: 24 U/L (ref 15–41)
Albumin: 2.6 g/dL — ABNORMAL LOW (ref 3.5–5.0)
Albumin: 2.9 g/dL — ABNORMAL LOW (ref 3.5–5.0)
Alkaline Phosphatase: 34 U/L — ABNORMAL LOW (ref 38–126)
Alkaline Phosphatase: 34 U/L — ABNORMAL LOW (ref 38–126)
Anion gap: 8 (ref 5–15)
Anion gap: 7 (ref 5–15)
BUN: 13 mg/dL (ref 8–23)
BUN: 12 mg/dL (ref 8–23)
CO2: 19 mmol/L — ABNORMAL LOW (ref 22–32)
CO2: 21 mmol/L — ABNORMAL LOW (ref 22–32)
Calcium: 7.2 mg/dL — ABNORMAL LOW (ref 8.9–10.3)
Calcium: 7.1 mg/dL — ABNORMAL LOW (ref 8.9–10.3)
Chloride: 109 mmol/L (ref 98–111)
Chloride: 110 mmol/L (ref 98–111)
Creatinine, Ser: 1 mg/dL (ref 0.44–1.00)
Creatinine, Ser: 0.87 mg/dL (ref 0.44–1.00)
GFR, Estimated: >60 mL/min (ref >60)
GFR, Estimated: >60 mL/min (ref >60)
Glucose, Bld: 326 mg/dL — ABNORMAL HIGH (ref 70–99)
Glucose, Bld: 230 mg/dL — ABNORMAL HIGH (ref 70–99)
Potassium: 4.7 mmol/L (ref 3.5–5.1)
Potassium: 4.2 mmol/L (ref 3.5–5.1)
Sodium: 136 mmol/L (ref 135–145)
Sodium: 138 mmol/L (ref 135–145)
Total Bilirubin: 1.2 mg/dL (ref 0.3–1.2)
Total Bilirubin: 1.1 mg/dL (ref 0.3–1.2)
Total Protein: 4.8 g/dL — ABNORMAL LOW (ref 6.5–8.1)
Total Protein: 5 g/dL — ABNORMAL LOW (ref 6.5–8.1)

## 2022-03-22 LAB — BASIC METABOLIC PANEL
Anion gap: 10 (ref 5–15)
Anion gap: 7 (ref 5–15)
BUN: 11 mg/dL (ref 8–23)
BUN: 10 mg/dL (ref 8–23)
CO2: 17 mmol/L — ABNORMAL LOW (ref 22–32)
CO2: 26 mmol/L (ref 22–32)
Calcium: 7.5 mg/dL — ABNORMAL LOW (ref 8.9–10.3)
Calcium: 7.8 mg/dL — ABNORMAL LOW (ref 8.9–10.3)
Chloride: 109 mmol/L (ref 98–111)
Chloride: 109 mmol/L (ref 98–111)
Creatinine, Ser: 0.85 mg/dL (ref 0.44–1.00)
Creatinine, Ser: 0.73 mg/dL (ref 0.44–1.00)
GFR, Estimated: >60 mL/min (ref >60)
GFR, Estimated: >60 mL/min (ref >60)
Glucose, Bld: 254 mg/dL — ABNORMAL HIGH (ref 70–99)
Glucose, Bld: 110 mg/dL — ABNORMAL HIGH (ref 70–99)
Potassium: 5.1 mmol/L (ref 3.5–5.1)
Potassium: 3.9 mmol/L (ref 3.5–5.1)
Sodium: 136 mmol/L (ref 135–145)
Sodium: 142 mmol/L (ref 135–145)

## 2022-03-22 LAB — POCT I-STAT 7, (LYTES, BLD GAS, ICA,H+H)
Acid-base deficit: 7 mmol/L — ABNORMAL HIGH (ref 0.0–2.0)
Acid-base deficit: 2 mmol/L (ref 0.0–2.0)
Bicarbonate: 18 mmol/L — ABNORMAL LOW (ref 20.0–28.0)
Bicarbonate: 23.2 mmol/L (ref 20.0–28.0)
Calcium, Ion: 1.05 mmol/L — ABNORMAL LOW (ref 1.15–1.40)
Calcium, Ion: 1.05 mmol/L — ABNORMAL LOW (ref 1.15–1.40)
HCT: 28 % — ABNORMAL LOW (ref 36.0–46.0)
HCT: 24 % — ABNORMAL LOW (ref 36.0–46.0)
Hemoglobin: 9.5 g/dL — ABNORMAL LOW (ref 12.0–15.0)
Hemoglobin: 8.2 g/dL — ABNORMAL LOW (ref 12.0–15.0)
O2 Saturation: 90 %
O2 Saturation: 89 %
Patient temperature: 97.8
Patient temperature: 98.3
Potassium: 4.5 mmol/L (ref 3.5–5.1)
Potassium: 3.4 mmol/L — ABNORMAL LOW (ref 3.5–5.1)
Sodium: 136 mmol/L (ref 135–145)
Sodium: 144 mmol/L (ref 135–145)
TCO2: 19 mmol/L — ABNORMAL LOW (ref 22–32)
TCO2: 24 mmol/L (ref 22–32)
pCO2 arterial: 32.6 mmHg (ref 32–48)
pCO2 arterial: 39.2 mmHg (ref 32–48)
pH, Arterial: 7.348 — ABNORMAL LOW (ref 7.35–7.45)
pH, Arterial: 7.38 (ref 7.35–7.45)
pO2, Arterial: 60 mmHg — ABNORMAL LOW (ref 83–108)
pO2, Arterial: 57 mmHg — ABNORMAL LOW (ref 83–108)

## 2022-03-22 LAB — PREPARE FRESH FROZEN PLASMA
Unit division: 0
Unit division: 0

## 2022-03-22 LAB — BPAM FFP
Blood Product Expiration Date: 202305082359
Blood Product Expiration Date: 202305082359
ISSUE DATE / TIME: 202305082011
ISSUE DATE / TIME: 202305082011
Unit Type and Rh: 6200
Unit Type and Rh: 6200

## 2022-03-22 LAB — CBC
HCT: 33.5 % — ABNORMAL LOW (ref 36.0–46.0)
HCT: 28.7 % — ABNORMAL LOW (ref 36.0–46.0)
HCT: 25.7 % — ABNORMAL LOW (ref 36.0–46.0)
HCT: 27.9 % — ABNORMAL LOW (ref 36.0–46.0)
Hemoglobin: 11.8 g/dL — ABNORMAL LOW (ref 12.0–15.0)
Hemoglobin: 10.3 g/dL — ABNORMAL LOW (ref 12.0–15.0)
Hemoglobin: 8.8 g/dL — ABNORMAL LOW (ref 12.0–15.0)
Hemoglobin: 9.7 g/dL — ABNORMAL LOW (ref 12.0–15.0)
MCH: 30.6 pg (ref 26.0–34.0)
MCH: 30.7 pg (ref 26.0–34.0)
MCH: 29.9 pg (ref 26.0–34.0)
MCH: 30.2 pg (ref 26.0–34.0)
MCHC: 35.2 g/dL (ref 30.0–36.0)
MCHC: 35.9 g/dL (ref 30.0–36.0)
MCHC: 34.2 g/dL (ref 30.0–36.0)
MCHC: 34.8 g/dL (ref 30.0–36.0)
MCV: 86.8 fL (ref 80.0–100.0)
MCV: 85.4 fL (ref 80.0–100.0)
MCV: 87.4 fL (ref 80.0–100.0)
MCV: 86.9 fL (ref 80.0–100.0)
Platelets: 78 10*3/uL — ABNORMAL LOW (ref 150–400)
Platelets: 87 10*3/uL — ABNORMAL LOW (ref 150–400)
Platelets: 81 10*3/uL — ABNORMAL LOW (ref 150–400)
Platelets: 75 10*3/uL — ABNORMAL LOW (ref 150–400)
RBC: 3.86 MIL/uL — ABNORMAL LOW (ref 3.87–5.11)
RBC: 3.36 MIL/uL — ABNORMAL LOW (ref 3.87–5.11)
RBC: 2.94 MIL/uL — ABNORMAL LOW (ref 3.87–5.11)
RBC: 3.21 MIL/uL — ABNORMAL LOW (ref 3.87–5.11)
RDW: 13.4 % (ref 11.5–15.5)
RDW: 14.1 % (ref 11.5–15.5)
RDW: 14 % (ref 11.5–15.5)
RDW: 13.8 % (ref 11.5–15.5)
WBC: 9.7 10*3/uL (ref 4.0–10.5)
WBC: 12.3 10*3/uL — ABNORMAL HIGH (ref 4.0–10.5)
WBC: 10.5 10*3/uL (ref 4.0–10.5)
WBC: 9.7 10*3/uL (ref 4.0–10.5)
nRBC: 0 % (ref 0.0–0.2)
nRBC: 0 % (ref 0.0–0.2)
nRBC: 0 % (ref 0.0–0.2)
nRBC: 0 % (ref 0.0–0.2)

## 2022-03-22 LAB — GLUCOSE, CAPILLARY
Glucose-Capillary: 112 mg/dL — ABNORMAL HIGH (ref 70–99)
Glucose-Capillary: 151 mg/dL — ABNORMAL HIGH (ref 70–99)
Glucose-Capillary: 151 mg/dL — ABNORMAL HIGH (ref 70–99)
Glucose-Capillary: 239 mg/dL — ABNORMAL HIGH (ref 70–99)
Glucose-Capillary: 241 mg/dL — ABNORMAL HIGH (ref 70–99)
Glucose-Capillary: 282 mg/dL — ABNORMAL HIGH (ref 70–99)
Glucose-Capillary: 322 mg/dL — ABNORMAL HIGH (ref 70–99)
Glucose-Capillary: 87 mg/dL (ref 70–99)

## 2022-03-22 LAB — APTT
aPTT: 29 seconds (ref 24–36)
aPTT: 31 seconds (ref 24–36)
aPTT: 32 seconds (ref 24–36)

## 2022-03-22 LAB — AMYLASE: Amylase: 69 U/L (ref 28–100)

## 2022-03-22 LAB — PROTIME-INR
INR: 1.2 (ref 0.8–1.2)
INR: 1.2 (ref 0.8–1.2)
INR: 1.2 (ref 0.8–1.2)
INR: 1.2 (ref 0.8–1.2)
Prothrombin Time: 15.5 seconds — ABNORMAL HIGH (ref 11.4–15.2)
Prothrombin Time: 15.5 seconds — ABNORMAL HIGH (ref 11.4–15.2)
Prothrombin Time: 15 seconds (ref 11.4–15.2)
Prothrombin Time: 15.4 seconds — ABNORMAL HIGH (ref 11.4–15.2)

## 2022-03-22 LAB — ECHOCARDIOGRAM COMPLETE
AR max vel: 1.71 cm2
AV Area VTI: 1.51 cm2
AV Area mean vel: 1.78 cm2
AV Mean grad: 5 mmHg
AV Peak grad: 11.4 mmHg
Ao pk vel: 1.69 m/s
Area-P 1/2: 24.47 cm2
Height: 56 in
S' Lateral: 2.4 cm
Weight: 2102.31 oz

## 2022-03-22 LAB — MAGNESIUM: Magnesium: 2.3 mg/dL (ref 1.7–2.4)

## 2022-03-22 LAB — PREPARE RBC (CROSSMATCH)

## 2022-03-22 MED ORDER — DEXTROSE-NACL 5-0.45 % IV SOLN
INTRAVENOUS | Status: DC
Start: 2022-03-22 — End: 2022-03-23

## 2022-03-22 MED ORDER — HYDROMORPHONE HCL 1 MG/ML IJ SOLN
0.0000 mg | INTRAMUSCULAR | Status: DC | PRN
  Administered 2022-03-22 – 2022-03-23 (×2): 1 mg via INTRAVENOUS
  Filled 2022-03-22 (×2): qty 1

## 2022-03-22 MED ORDER — CALCIUM GLUCONATE-NACL 1-0.675 GM/50ML-% IV SOLN
1.0000 g | Freq: Once | INTRAVENOUS | Status: AC
  Administered 2022-03-22: 1000 mg via INTRAVENOUS
  Filled 2022-03-22: qty 50

## 2022-03-22 MED ORDER — PERFLUTREN LIPID MICROSPHERE
1.0000 mL | INTRAVENOUS | Status: AC | PRN
  Administered 2022-03-22: 1 mL via INTRAVENOUS
  Filled 2022-03-22: qty 10

## 2022-03-22 MED ORDER — FUROSEMIDE 10 MG/ML IJ SOLN
20.0000 mg | Freq: Once | INTRAMUSCULAR | Status: AC
  Administered 2022-03-22: 20 mg via INTRAVENOUS
  Filled 2022-03-22: qty 2

## 2022-03-22 MED ORDER — ALBUMIN HUMAN 25 % IV SOLN
25.0000 g | Freq: Once | INTRAVENOUS | Status: AC
  Administered 2022-03-22: 25 g via INTRAVENOUS
  Filled 2022-03-22: qty 100

## 2022-03-22 MED ORDER — LIP MEDEX EX OINT
TOPICAL_OINTMENT | CUTANEOUS | Status: DC | PRN
  Administered 2022-03-26: 150 via TOPICAL
  Administered 2022-04-09: 1 via TOPICAL
  Administered 2022-04-09: 75 via TOPICAL
  Administered 2022-04-10: 1 via TOPICAL
  Filled 2022-03-22: qty 7

## 2022-03-22 MED ORDER — PLASMA-LYTE A IV SOLN: Freq: Once | INTRAVENOUS | Status: DC | PRN

## 2022-03-22 MED ORDER — INSULIN DETEMIR 100 UNIT/ML ~~LOC~~ SOLN
8.0000 [IU] | Freq: Two times a day (BID) | SUBCUTANEOUS | Status: DC
  Administered 2022-03-22 (×2): 8 [IU] via SUBCUTANEOUS
  Filled 2022-03-22 (×4): qty 0.08

## 2022-03-22 MED ORDER — FENTANYL CITRATE PF 50 MCG/ML IJ SOSY
50.0000 ug | PREFILLED_SYRINGE | INTRAMUSCULAR | Status: DC | PRN
  Administered 2022-03-22 – 2022-03-23 (×3): 50 ug via INTRAVENOUS
  Filled 2022-03-22 (×2): qty 1

## 2022-03-22 MED ORDER — SODIUM BICARBONATE 8.4 % IV SOLN
50.0000 meq | Freq: Once | INTRAVENOUS | Status: AC
  Administered 2022-03-22: 50 meq via INTRAVENOUS
  Filled 2022-03-22: qty 50

## 2022-03-22 MED ORDER — PLASMA-LYTE A IV SOLN: Freq: Once | INTRAVENOUS | Status: AC

## 2022-03-22 MED ORDER — NOREPINEPHRINE 4 MG/250ML-% IV SOLN
0.0000 ug/min | INTRAVENOUS | Status: DC
  Administered 2022-03-22: 18 ug/min via INTRAVENOUS
  Administered 2022-03-22: 2 ug/min via INTRAVENOUS
  Administered 2022-03-23: 20 ug/min via INTRAVENOUS
  Administered 2022-03-23: 14 ug/min via INTRAVENOUS
  Filled 2022-03-22 (×4): qty 250

## 2022-03-22 MED ORDER — SODIUM CHLORIDE 0.9% IV SOLUTION: Freq: Once | INTRAVENOUS | Status: AC

## 2022-03-22 MED ORDER — PLASMA-LYTE A IV SOLN: Freq: Once | INTRAVENOUS | Status: DC

## 2022-03-22 MED ORDER — ALBUMIN HUMAN 5 % IV SOLN
12.5000 g | Freq: Once | INTRAVENOUS | Status: AC
  Administered 2022-03-22: 12.5 g via INTRAVENOUS
  Filled 2022-03-22: qty 250

## 2022-03-22 NOTE — Plan of Care (Signed)
?  Problem: Clinical Measurements: ?Goal: Will remain free from infection ?Outcome: Progressing ?Goal: Diagnostic test results will improve ?Outcome: Progressing ?Goal: Respiratory complications will improve ?Outcome: Progressing ?Goal: Cardiovascular complication will be avoided ?Outcome: Progressing ?  ?Problem: Elimination: ?Goal: Will not experience complications related to urinary retention ?Outcome: Progressing ?  ?Problem: Pain Managment: ?Goal: General experience of comfort will improve ?Outcome: Progressing ?  ?Problem: Safety: ?Goal: Ability to remain free from injury will improve ?Outcome: Progressing ?  ?Problem: Skin Integrity: ?Goal: Risk for impaired skin integrity will decrease ?Outcome: Progressing ?  ?

## 2022-03-22 NOTE — Progress Notes (Signed)
Limited lower extremity arterial duplex (bypass graft) duplex completed. ?Refer to "CV Proc" under chart review to view preliminary results. ? ?03/22/2022 8:43 AM ?Kelby Aline., MHA, RVT, RDCS, RDMS   ?

## 2022-03-22 NOTE — Consult Note (Signed)
NAME:  Gabrielle [name], MRN:  [id number], DOB:  15-Jun-1956, LOS: 1 ADMISSION DATE:  03/21/2022, CONSULTATION DATE:  03/22/22 REFERRING MD:  Sherral Hammers, CHIEF COMPLAINT:  abd pain   History of Present Illness:  66 year old woman with hx of rectal cancer, DM2, HTN, HLD who presented for open aortobifemoral bypass by Dr. Sherral Hammers for critical limb ischemia.  Postop course complicated by shock for which PCCM is consulted.  Patient underwent procedure with 14-7 bifurcated dacron graft, bilateral femoral endarterectomy late yesterday.  EBL 1.5L.  Currently c/o SOB and abdominal discomfort.  Pertinent  Medical History  DM2- A1c 9.3% HTN HLD Severe PVD  Significant Hospital Events: Including procedures, antibiotic start and stop dates in addition to other pertinent events   5/8  Aortobifemoral bypass using 14 x 7 bifurcated Dacron graft Bilateral femoral endarterectomy,  Left-sided profunda endarterectomy with bovine pericardial patch   Interim History / Subjective:  consulted  Objective   Blood pressure 121/76, pulse (!) 121, temperature 97.7 F (36.5 C), temperature source Axillary, resp. rate 18, height 4\' 8"  (1.422 m), weight 59.6 kg, SpO2 93 %.        Intake/Output Summary (Last 24 hours) at 03/22/2022 0743 Last data filed at 03/22/2022 0700 Gross per 24 hour  Intake 9102.04 ml  Output 3675 ml  Net 5427.04 ml   Filed Weights   03/21/22 0548 03/22/22 0500  Weight: 53.5 kg 59.6 kg    Examination: General: chronically ill woman lying in bed HENT: MMM, trachea midline Lungs: diminished bases due to shallow inspiratory effort Cardiovascular: tachycardic, ext lukewarm Abdomen: distended, TTP diffusely, hypoactive BS Extremities: cannot palpate pulses Neuro: able to move all 4 ext to command GU: foley in place Bedside echo bounding function  ABG looks ok AM BMP ok H/H holding steady  Resolved Hospital Problem list   N/A  Assessment & Plan:  Postoperative shock- seems mostly  distributive related to intra-abdominal fluid shifts, very responsive to fluids. At risk fluid overload- will watch closely PAD POD 0 Aortobifemoral bypass- lots of pain, otherwise seems to be doing okay, dopplerable pulses x 2. DM2 uncontrolled with hyperglycemia HTN, HLD hx - Levophed titrated to MAP 65 but prefer crystalloid/blood products administered to limit pressor dose - Avoid acidemia, coagulopathy - Start long acting insulin, continue SSI, low threshold for insulin drip - Limited echo for documentation purposes - Watch for s/s of fluid overload - Try to find happy balance between pain control and protection of airway - Will follow closely  Best Practice (right click and "Reselect all SmartList Selections" daily)   Diet/type: NPO DVT prophylaxis: SCD GI prophylaxis: N/A Lines: Central line Foley:  Yes, and it is still needed Code Status:  full code Last date of multidisciplinary goals of care discussion [Per primary]  Labs   CBC: Recent Labs  Lab 03/21/22 1425 03/22/22 0142  WBC 15.3* 9.7  HGB 8.4* 11.8*  HCT 25.4* 33.5*  MCV 91.7 86.8  PLT 117* 78*    Basic Metabolic Panel: Recent Labs  Lab 03/21/22 1425 03/22/22 0142  NA 137 136  K 3.6 5.1  CL 107 109  CO2 18* 17*  GLUCOSE 191* 254*  BUN 7* 11  CREATININE 0.64 0.85  CALCIUM 7.3* 7.5*  MG 1.2*  --    GFR: Estimated Creatinine Clearance: 47.5 mL/min (by C-G formula based on SCr of 0.85 mg/dL). Recent Labs  Lab 03/21/22 1425 03/22/22 0142  WBC 15.3* 9.7    Liver Function Tests: No results for  input(s): AST, ALT, ALKPHOS, BILITOT, PROT, ALBUMIN in the last 168 hours. No results for input(s): LIPASE, AMYLASE in the last 168 hours. No results for input(s): AMMONIA in the last 168 hours.  ABG    Component Value Date/Time   PHART 7.31 (L) 03/21/2022 1425   PCO2ART 37 03/21/2022 1425   PO2ART 86 03/21/2022 1425   HCO3 18.7 (L) 03/21/2022 1425   TCO2 29 08/04/2009 1121   ACIDBASEDEF 7.1 (H)  03/21/2022 1425   O2SAT 97.9 03/21/2022 1425     Coagulation Profile: Recent Labs  Lab 03/21/22 1500 03/22/22 0142  INR 1.7* 1.2    Cardiac Enzymes: No results for input(s): CKTOTAL, CKMB, CKMBINDEX, TROPONINI in the last 168 hours.  HbA1C: Hemoglobin A1C  Date/Time Value Ref Range Status  08/20/2021 09:04 AM 9.9 (A) 4.0 - 5.6 % Final  10/23/2020 05:04 PM 9.9 (A) 4.0 - 5.6 % Final   HbA1c, POC (controlled diabetic range)  Date/Time Value Ref Range Status  03/22/2021 08:59 AM 8.0 (A) 0.0 - 7.0 % Final   Hgb A1c MFr Bld  Date/Time Value Ref Range Status  03/14/2022 03:00 PM 9.2 (H) 4.8 - 5.6 % Final    Comment:    (NOTE) Pre diabetes:          5.7%-6.4%  Diabetes:              >6.4%  Glycemic control for   <7.0% adults with diabetes   01/27/2020 05:19 PM 6.4 (H) 4.8 - 5.6 % Final    Comment:             Prediabetes: 5.7 - 6.4          Diabetes: >6.4          Glycemic control for adults with diabetes: <7.0     CBG: Recent Labs  Lab 03/21/22 1210 03/21/22 1351 03/21/22 1455 03/21/22 2356 03/22/22 0347  GLUCAP 200* 177* 186* 241* 282*    Review of Systems:   Limited due to somnolence C/o stomach pain, fatigue  Past Medical History:  She,  has a past medical history of Cancer (HCC) (2010), Cataract, Diabetes mellitus without complication (HCC), Eczema, Hyperlipidemia, Hypertension, Intrinsic (urethral) sphincter deficiency (ISD), RBBB, SUI (stress urinary incontinence, female), Type 2 diabetes mellitus (HCC), Wears dentures, and Wears glasses.   Surgical History:   Past Surgical History:  Procedure Laterality Date   ABDOMINAL HYSTERECTOMY  1998   fibroids, partial   ABDOMINOPLASTY  1999   BLADDER SUSPENSION N/A 03/06/2014   Procedure: [facility] ;  Surgeon: Bjorn Pippin, MD;  Location: [facility];  Service: Urology;  Laterality: N/A;   CARDIOVASCULAR STRESS TEST  03-27-2008   NORMAL PERFUSION STUDY/  EF 54%   COLON SURGERY  2010   for  rectal ca   COLONOSCOPY     EUS N/A 03/02/2017   Procedure: LOWER ENDOSCOPIC ULTRASOUND (EUS);  Surgeon: Rachael Fee, MD;  Location: [facility];  Service: Endoscopy;  Laterality: N/A;   EXCISION LOWER ABDOMINAL SCAR POST ABDOMINOPLASTY  01-05-2000   ORIF RIGHT ANKLE FX  12-24-2008   RIGHT ANKLE ARTHROSCOPY W/ DEBRIDEMENT AND OPEN REMOVAL HARDWARE  03-12-2010   TRANSTHORACIC ECHOCARDIOGRAM  08-05-2009   MILD LVH/  EF 60-65%   TUBAL LIGATION       Social History:   reports that she quit smoking about 15 years ago. Her smoking use included cigarettes. She has never used smokeless tobacco. She reports that she does not drink alcohol and does  not use drugs.   Family History:  Her family history includes Diabetes in her brother, mother, and sister; Hypertension in her brother, mother, and sister; Lupus in her daughter. There is no history of Colon cancer, Esophageal cancer, Rectal cancer, Stomach cancer, or Colon polyps.   Allergies No Known Allergies   Home Medications  Prior to Admission medications   Medication Sig Start Date End Date Taking? Authorizing Provider  amLODipine (NORVASC) 10 MG tablet Take 1 tablet (10 mg total) by mouth daily. 11/26/21  Yes Georganna Skeans, MD  aspirin 81 MG tablet Take 81 mg by mouth daily.   Yes [provider]  atorvastatin (LIPITOR) 40 MG tablet Take 1 tablet (40 mg total) by mouth every morning. 11/26/21  Yes Georganna Skeans, MD  dapagliflozin propanediol (FARXIGA) 10 MG TABS tablet Take 1 tablet (10 mg total) by mouth daily. 11/26/21  Yes Georganna Skeans, MD  lisinopril (ZESTRIL) 10 MG tablet TAKE 1 TABLET BY MOUTH ONCE DAILY IN THE MORNING 11/26/21  Yes Georganna Skeans, MD  metFORMIN (GLUCOPHAGE) 1000 MG tablet TAKE 1 TABLET BY MOUTH TWICE DAILY WITH A MEAL 11/26/21  Yes Georganna Skeans, MD  Semaglutide (RYBELSUS) 14 MG TABS Take 14 mg by mouth daily. 11/26/21  Yes Georganna Skeans, MD  sitaGLIPtin (JANUVIA) 25 MG tablet Take 25 mg by mouth daily.    Yes [provider]  gabapentin (NEURONTIN) 300 MG capsule Take 1 capsule (300 mg total) by mouth at bedtime. Patient taking differently: Take 300 mg by mouth at bedtime as needed (pain). 11/26/21   Georganna Skeans, MD  glucose blood (ACCU-CHEK SMARTVIEW) test strip PATIENT NEEDS OFFICE VISIT FOR ADDITIONAL REFILLS - very overdue for DM check up. 11/11/15   Trena Platt D, PA     Critical care time: 32 min cc time

## 2022-03-22 NOTE — Progress Notes (Signed)
Inpatient Diabetes Program Recommendations ? ?AACE/ADA: New Consensus Statement on Inpatient Glycemic Control (2015) ? ?Target Ranges:  Prepandial:   less than 140 mg/dL ?     Peak postprandial:   less than 180 mg/dL (1-2 hours) ?     Critically ill patients:  140 - 180 mg/dL  ? ?Lab Results  ?Component Value Date  ? GLUCAP 239 (H) 03/22/2022  ? HGBA1C 9.2 (H) 03/14/2022  ? ? ?Review of Glycemic Control ? ?Diabetes history: DM2 ?Outpatient Diabetes medications: Farxiga 10 QD, met 1000 mg QD, Rybelsus 14 QD, Januvia 25 mg QD ?Current orders for Inpatient glycemic control: Levemir 8 units BID, Novolog 0-15 Q4H ? ?HgbA1C - 9.2% ?Pt is NPO ? ?Inpatient Diabetes Program Recommendations:   ? ?Insulin orders started today. ? ?Will speak with pt about her diabetes control and HgbA1C of 9.2%. ? ?Closely follow glucose trends and make recs for titration if needed. ? ?Thank you. ?Lorenda Peck, RD, LDN, CDE ?Inpatient Diabetes Coordinator ?[phone]  ? ? ? ? ? ? ?

## 2022-03-22 NOTE — Progress Notes (Signed)
OT Cancellation Note ? ?Patient Details ?Name: Gabrielle [name] ?MRN: [id number] ?DOB: 1955-12-04 ? ? ?Cancelled Treatment:    Reason Eval/Treat Not Completed: Patient not medically ready (Nsg requesting to hold today due to BP issues. Will return at later time.) ? ?Chace Bisch,HILLARY ?03/22/2022, 9:29 AM ?Maurie Boettcher, OT/L  ? ?Acute OT Clinical Specialist ?Acute Rehabilitation Services ?Pager [phone] ?Office [phone]  ?

## 2022-03-22 NOTE — Progress Notes (Addendum)
?Aortic Surgery Progress Note ? ? ? ?03/22/2022 ?6:46 AM ?1 Day Post-Op ? ?Subjective:  says she has some pain in her belly.  Denies pain in her feet.   ? ?Afebrile ?HR 90's-120's ST ?78'E-423'N systolic ?36% RA ? ?Gtts:  levophed ? ?Transfusion: ?4 PRBC's ?2 FFP ? ?Vitals:  ? 03/22/22 0500 03/22/22 0600  ?BP: 114/76 109/77  ?Pulse: (!) 122 (!) 123  ?Resp: (!) 22 20  ?Temp:    ?SpO2: 92% 93%  ? ? ?Physical Exam: ?Cardiac:  tachycardic ?Lungs:  non labored ?Abdomen:  distended-tender to palpation throughout.  No BS heard ?Incisions:  bilateral groin and laparotomy incisions look good ?Extremities:  motor and sensory in tact bilateral feet.  Unable to obtain doppler signals ?General:  overall no distress ? ?CBC ?   ?Component Value Date/Time  ? WBC 9.7 03/22/2022 0142  ? RBC 3.86 (L) 03/22/2022 0142  ? HGB 11.8 (L) 03/22/2022 0142  ? HGB 14.5 03/22/2021 0916  ? HCT 33.5 (L) 03/22/2022 0142  ? HCT 43.9 03/22/2021 0916  ? PLT 78 (L) 03/22/2022 0142  ? PLT 261 03/22/2021 0916  ? MCV 86.8 03/22/2022 0142  ? MCV 90 03/22/2021 0916  ? MCH 30.6 03/22/2022 0142  ? MCHC 35.2 03/22/2022 0142  ? RDW 13.4 03/22/2022 0142  ? RDW 12.5 03/22/2021 0916  ? LYMPHSABS 2.8 02/03/2015 0527  ? MONOABS 0.4 02/03/2015 0527  ? EOSABS 0.1 02/03/2015 0527  ? BASOSABS 0.0 02/03/2015 0527  ? ? ?BMET ?   ?Component Value Date/Time  ? NA 136 03/22/2022 0142  ? NA 137 03/22/2021 0916  ? K 5.1 03/22/2022 0142  ? CL 109 03/22/2022 0142  ? CO2 17 (L) 03/22/2022 0142  ? GLUCOSE 254 (H) 03/22/2022 0142  ? BUN 11 03/22/2022 0142  ? BUN 11 03/22/2021 0916  ? CREATININE 0.85 03/22/2022 0142  ? CREATININE 0.74 11/11/2015 1921  ? CALCIUM 7.5 (L) 03/22/2022 0142  ? GFRNONAA >60 03/22/2022 0142  ? [facility] >89 11/11/2015 1921  ? GFRAA 78 10/23/2020 1701  ? GFRAA >89 11/11/2015 1921  ? ? ?INR ?   ?Component Value Date/Time  ? INR 1.2 03/22/2022 0142  ? ? ? ?Intake/Output Summary (Last 24 hours) at 03/22/2022 0646 ?Last data filed at 03/22/2022 0400 ?Gross per 24 hour   ?Intake 8620.31 ml  ?Output 3675 ml  ?Net 4945.31 ml  ? ? ? ?Assessment/Plan:  66 y.o. female is s/p  ?Aortobifemoral bypass using 14 x 7 bifurcated Dacron graft ?Bilateral femoral endarterectomy,  ?Left-sided profunda endarterectomy with bovine pericardial patch ?1 Day Post-Op ? ? ?-Vascular:  unable to obtain doppler signals.  She had monophasic doppler signals at baseline and she is on pressor support.  Will obtain stat BLE arterial duplexes to evaluate.   ?-Cardiac:  tachycardic and hypotensive requiring Levophed.  She has received a total of 4 PRBC's and 2 FFP.  Will give 1L of plasmalyte.   She is not having any chest pain.  ?-Pulmonary:  extubated on room air ?-Neuro:  in tact ?-Renal:  creatinine normal with good uop.  K+ 5.1.  there is no supplemental K+ ordered in IVF; stat labs ordered for this am. ?-GI:  abdomen distended and no BS.  Continue npo until passing flatus ?-Incisions:  all incisions look good.  ?-Heme/ID:  acute blood loss anemia improved with PRBC's with hgb 11.8 up from 8.4 this am. Periop leukocytosis yesterday resolved.  Thrombocytopenia with platelets of 78k this am down from 117k.  Continue  to monitor.  ?-General:  overall in no distress ? ? ?Leontine Locket, PA-C ?[facility] ?[phone] ?03/22/2022 ?6:46 AM ? ?VASCULAR STAFF ADDENDUM: ?I have independently interviewed and examined the patient. ?I agree with the above.  ?In short, Pt is POD1 - Aortobifemoral bypass, difficulty maintaining blood pressure without the use of pressor ?No pain in the feet. Mild to moderate abdominal pain ?No chest pain. Some SOB, feels best sitting up. ? ?On physical exam - Tachycardic, normotensive supplemented with levo ?Weak, monophasic signals in the feet.  ?Abd distended but soft ? ?Total blood product 4 RBC, 2 FFP ?Total crystalloid 2L ?Currently making great urine.  ? ?Labs pending. Small base deficit. ?I think the weak signals are a product of the pressor requirement ?Ultrasound to  come and assess ?Likely had postop bleeding but responded well to product. ?Likely venous, as arterial bleeding would have necessitated OR exploration yesterday.  ?Will continue to monitor closely.  ?Family updated. Pt getting crystalloid this morning.  ? ?Appreciate help from ICU, critical care colleagues.  ?Will follow up labs ? ?J. Melene Muller, MD ?[facility] ?Office Phone Number: [phone] ?03/22/2022 8:10 AM ? ? ? ?

## 2022-03-22 NOTE — Care Management (Signed)
?  Transition of Care (TOC) Screening Note ? ? ?Patient Details  ?Name: Gabrielle [name] ?Date of Birth: February 05, 1956 ? ? ?Transition of Care (TOC) CM/SW Contact:    ?Graves-Bigelow, Ocie Cornfield, RN ?Phone Number: ?03/22/2022, 1:11 PM ? ? ? ?Transition of Care Department [facility]) has reviewed the patient and no TOC needs have been identified at this time. We will continue to monitor patient advancement through interdisciplinary progression rounds. If new patient transition needs arise, please place a TOC consult. ? ? ?

## 2022-03-22 NOTE — Progress Notes (Signed)
PT Cancellation Note ? ?Patient Details ?Name: MADDYSON KEIL ?MRN: [id number] ?DOB: 03-10-1956 ? ? ?Cancelled Treatment:    Reason Eval/Treat Not Completed: Medical issues which prohibited therapy (Discussed with RN; pt having BP issues and receiving fluid bolus. Will follow). ? ?Wyona Almas, PT, DPT ?Acute Rehabilitation Services ?Pager [phone] ?Office [phone] ? ? ? ?Deno Etienne ?03/22/2022, 9:01 AM ?

## 2022-03-23 ENCOUNTER — Inpatient Hospital Stay (HOSPITAL_COMMUNITY): Payer: BC Managed Care – PPO

## 2022-03-23 DIAGNOSIS — R9431 Abnormal electrocardiogram [ECG] [EKG]: Secondary | ICD-10-CM | POA: Diagnosis not present

## 2022-03-23 DIAGNOSIS — I739 Peripheral vascular disease, unspecified: Secondary | ICD-10-CM | POA: Diagnosis not present

## 2022-03-23 LAB — BPAM RBC
Blood Product Expiration Date: 202306032359
Blood Product Expiration Date: 202306032359
Blood Product Expiration Date: 202306032359
Blood Product Expiration Date: 202306032359
Blood Product Expiration Date: 202306062359
ISSUE DATE / TIME: 202305081522
ISSUE DATE / TIME: 202305081522
ISSUE DATE / TIME: 202305082011
ISSUE DATE / TIME: 202305082011
ISSUE DATE / TIME: 202305091321
Unit Type and Rh: 5100
Unit Type and Rh: 5100
Unit Type and Rh: 5100
Unit Type and Rh: 5100
Unit Type and Rh: 5100

## 2022-03-23 LAB — POCT I-STAT 7, (LYTES, BLD GAS, ICA,H+H)
Acid-Base Excess: 0 mmol/L (ref 0.0–2.0)
Acid-base deficit: 1 mmol/L (ref 0.0–2.0)
Acid-base deficit: 3 mmol/L — ABNORMAL HIGH (ref 0.0–2.0)
Acid-base deficit: 3 mmol/L — ABNORMAL HIGH (ref 0.0–2.0)
Acid-base deficit: 5 mmol/L — ABNORMAL HIGH (ref 0.0–2.0)
Acid-base deficit: 5 mmol/L — ABNORMAL HIGH (ref 0.0–2.0)
Acid-base deficit: 5 mmol/L — ABNORMAL HIGH (ref 0.0–2.0)
Acid-base deficit: 8 mmol/L — ABNORMAL HIGH (ref 0.0–2.0)
Bicarbonate: 16.7 mmol/L — ABNORMAL LOW (ref 20.0–28.0)
Bicarbonate: 18.8 mmol/L — ABNORMAL LOW (ref 20.0–28.0)
Bicarbonate: 19.2 mmol/L — ABNORMAL LOW (ref 20.0–28.0)
Bicarbonate: 20.4 mmol/L (ref 20.0–28.0)
Bicarbonate: 22 mmol/L (ref 20.0–28.0)
Bicarbonate: 23.5 mmol/L (ref 20.0–28.0)
Bicarbonate: 23.5 mmol/L (ref 20.0–28.0)
Bicarbonate: 24.9 mmol/L (ref 20.0–28.0)
Calcium, Ion: 0.99 mmol/L — ABNORMAL LOW (ref 1.15–1.40)
Calcium, Ion: 1.05 mmol/L — ABNORMAL LOW (ref 1.15–1.40)
Calcium, Ion: 1.05 mmol/L — ABNORMAL LOW (ref 1.15–1.40)
Calcium, Ion: 1.07 mmol/L — ABNORMAL LOW (ref 1.15–1.40)
Calcium, Ion: 1.1 mmol/L — ABNORMAL LOW (ref 1.15–1.40)
Calcium, Ion: 1.11 mmol/L — ABNORMAL LOW (ref 1.15–1.40)
Calcium, Ion: 1.13 mmol/L — ABNORMAL LOW (ref 1.15–1.40)
Calcium, Ion: 1.22 mmol/L (ref 1.15–1.40)
HCT: 25 % — ABNORMAL LOW (ref 36.0–46.0)
HCT: 26 % — ABNORMAL LOW (ref 36.0–46.0)
HCT: 27 % — ABNORMAL LOW (ref 36.0–46.0)
HCT: 27 % — ABNORMAL LOW (ref 36.0–46.0)
HCT: 28 % — ABNORMAL LOW (ref 36.0–46.0)
HCT: 29 % — ABNORMAL LOW (ref 36.0–46.0)
HCT: 37 % (ref 36.0–46.0)
HCT: 38 % (ref 36.0–46.0)
Hemoglobin: 12.6 g/dL (ref 12.0–15.0)
Hemoglobin: 12.9 g/dL (ref 12.0–15.0)
Hemoglobin: 8.5 g/dL — ABNORMAL LOW (ref 12.0–15.0)
Hemoglobin: 8.8 g/dL — ABNORMAL LOW (ref 12.0–15.0)
Hemoglobin: 9.2 g/dL — ABNORMAL LOW (ref 12.0–15.0)
Hemoglobin: 9.2 g/dL — ABNORMAL LOW (ref 12.0–15.0)
Hemoglobin: 9.5 g/dL — ABNORMAL LOW (ref 12.0–15.0)
Hemoglobin: 9.9 g/dL — ABNORMAL LOW (ref 12.0–15.0)
O2 Saturation: 100 %
O2 Saturation: 100 %
O2 Saturation: 100 %
O2 Saturation: 86 %
O2 Saturation: 89 %
O2 Saturation: 94 %
O2 Saturation: 97 %
O2 Saturation: 99 %
Patient temperature: 35
Patient temperature: 35.2
Patient temperature: 35.4
Patient temperature: 97.9
Patient temperature: 98.2
Patient temperature: 98.3
Patient temperature: 98.5
Patient temperature: 98.7
Potassium: 3.4 mmol/L — ABNORMAL LOW (ref 3.5–5.1)
Potassium: 3.6 mmol/L (ref 3.5–5.1)
Potassium: 3.6 mmol/L (ref 3.5–5.1)
Potassium: 3.6 mmol/L (ref 3.5–5.1)
Potassium: 3.9 mmol/L (ref 3.5–5.1)
Potassium: 4.4 mmol/L (ref 3.5–5.1)
Potassium: 4.6 mmol/L (ref 3.5–5.1)
Potassium: 5.1 mmol/L (ref 3.5–5.1)
Sodium: 136 mmol/L (ref 135–145)
Sodium: 137 mmol/L (ref 135–145)
Sodium: 137 mmol/L (ref 135–145)
Sodium: 138 mmol/L (ref 135–145)
Sodium: 138 mmol/L (ref 135–145)
Sodium: 139 mmol/L (ref 135–145)
Sodium: 140 mmol/L (ref 135–145)
Sodium: 142 mmol/L (ref 135–145)
TCO2: 18 mmol/L — ABNORMAL LOW (ref 22–32)
TCO2: 20 mmol/L — ABNORMAL LOW (ref 22–32)
TCO2: 20 mmol/L — ABNORMAL LOW (ref 22–32)
TCO2: 21 mmol/L — ABNORMAL LOW (ref 22–32)
TCO2: 23 mmol/L (ref 22–32)
TCO2: 24 mmol/L (ref 22–32)
TCO2: 25 mmol/L (ref 22–32)
TCO2: 27 mmol/L (ref 22–32)
pCO2 arterial: 28.1 mmHg — ABNORMAL LOW (ref 32–48)
pCO2 arterial: 28.7 mmHg — ABNORMAL LOW (ref 32–48)
pCO2 arterial: 29.5 mmHg — ABNORMAL LOW (ref 32–48)
pCO2 arterial: 34.4 mmHg (ref 32–48)
pCO2 arterial: 34.5 mmHg (ref 32–48)
pCO2 arterial: 35 mmHg (ref 32–48)
pCO2 arterial: 35.6 mmHg (ref 32–48)
pCO2 arterial: 58.8 mmHg — ABNORMAL HIGH (ref 32–48)
pH, Arterial: 7.234 — ABNORMAL LOW (ref 7.35–7.45)
pH, Arterial: 7.359 (ref 7.35–7.45)
pH, Arterial: 7.37 (ref 7.35–7.45)
pH, Arterial: 7.399 (ref 7.35–7.45)
pH, Arterial: 7.427 (ref 7.35–7.45)
pH, Arterial: 7.428 (ref 7.35–7.45)
pH, Arterial: 7.433 (ref 7.35–7.45)
pH, Arterial: 7.441 (ref 7.35–7.45)
pO2, Arterial: 107 mmHg (ref 83–108)
pO2, Arterial: 114 mmHg — ABNORMAL HIGH (ref 83–108)
pO2, Arterial: 178 mmHg — ABNORMAL HIGH (ref 83–108)
pO2, Arterial: 203 mmHg — ABNORMAL HIGH (ref 83–108)
pO2, Arterial: 308 mmHg — ABNORMAL HIGH (ref 83–108)
pO2, Arterial: 48 mmHg — ABNORMAL LOW (ref 83–108)
pO2, Arterial: 54 mmHg — ABNORMAL LOW (ref 83–108)
pO2, Arterial: 70 mmHg — ABNORMAL LOW (ref 83–108)

## 2022-03-23 LAB — TYPE AND SCREEN
ABO/RH(D): O POS
Antibody Screen: NEGATIVE
Unit division: 0
Unit division: 0
Unit division: 0
Unit division: 0
Unit division: 0

## 2022-03-23 LAB — CBC
HCT: 26 % — ABNORMAL LOW (ref 36.0–46.0)
HCT: 26.2 % — ABNORMAL LOW (ref 36.0–46.0)
HCT: 26.3 % — ABNORMAL LOW (ref 36.0–46.0)
HCT: 26.7 % — ABNORMAL LOW (ref 36.0–46.0)
Hemoglobin: 8.8 g/dL — ABNORMAL LOW (ref 12.0–15.0)
Hemoglobin: 9.1 g/dL — ABNORMAL LOW (ref 12.0–15.0)
Hemoglobin: 9.2 g/dL — ABNORMAL LOW (ref 12.0–15.0)
Hemoglobin: 9.3 g/dL — ABNORMAL LOW (ref 12.0–15.0)
MCH: 30.2 pg (ref 26.0–34.0)
MCH: 30.2 pg (ref 26.0–34.0)
MCH: 30.4 pg (ref 26.0–34.0)
MCH: 31 pg (ref 26.0–34.0)
MCHC: 33.5 g/dL (ref 30.0–36.0)
MCHC: 34.7 g/dL (ref 30.0–36.0)
MCHC: 34.8 g/dL (ref 30.0–36.0)
MCHC: 35.4 g/dL (ref 30.0–36.0)
MCV: 86.7 fL (ref 80.0–100.0)
MCV: 87.5 fL (ref 80.0–100.0)
MCV: 87.6 fL (ref 80.0–100.0)
MCV: 90.4 fL (ref 80.0–100.0)
Platelets: 128 10*3/uL — ABNORMAL LOW (ref 150–400)
Platelets: 130 10*3/uL — ABNORMAL LOW (ref 150–400)
Platelets: 135 10*3/uL — ABNORMAL LOW (ref 150–400)
Platelets: 139 10*3/uL — ABNORMAL LOW (ref 150–400)
RBC: 2.91 MIL/uL — ABNORMAL LOW (ref 3.87–5.11)
RBC: 2.97 MIL/uL — ABNORMAL LOW (ref 3.87–5.11)
RBC: 2.99 MIL/uL — ABNORMAL LOW (ref 3.87–5.11)
RBC: 3.08 MIL/uL — ABNORMAL LOW (ref 3.87–5.11)
RDW: 14.2 % (ref 11.5–15.5)
RDW: 14.5 % (ref 11.5–15.5)
RDW: 14.6 % (ref 11.5–15.5)
RDW: 14.8 % (ref 11.5–15.5)
WBC: 13 10*3/uL — ABNORMAL HIGH (ref 4.0–10.5)
WBC: 14.2 10*3/uL — ABNORMAL HIGH (ref 4.0–10.5)
WBC: 14.6 10*3/uL — ABNORMAL HIGH (ref 4.0–10.5)
WBC: 16 10*3/uL — ABNORMAL HIGH (ref 4.0–10.5)
nRBC: 0 % (ref 0.0–0.2)
nRBC: 0 % (ref 0.0–0.2)
nRBC: 0 % (ref 0.0–0.2)
nRBC: 0 % (ref 0.0–0.2)

## 2022-03-23 LAB — PROTIME-INR
INR: 1.1 (ref 0.8–1.2)
INR: 1.2 (ref 0.8–1.2)
INR: 1.3 — ABNORMAL HIGH (ref 0.8–1.2)
INR: 1.5 — ABNORMAL HIGH (ref 0.8–1.2)
Prothrombin Time: 14.3 seconds (ref 11.4–15.2)
Prothrombin Time: 15.5 seconds — ABNORMAL HIGH (ref 11.4–15.2)
Prothrombin Time: 15.6 seconds — ABNORMAL HIGH (ref 11.4–15.2)
Prothrombin Time: 17.7 seconds — ABNORMAL HIGH (ref 11.4–15.2)

## 2022-03-23 LAB — ECHOCARDIOGRAM LIMITED
Area-P 1/2: 3.99 cm2
Calc EF: 30 %
Height: 56 in
S' Lateral: 3.2 cm
Single Plane A2C EF: 28.1 %
Single Plane A4C EF: 36.2 %
Weight: 2102.31 oz

## 2022-03-23 LAB — BASIC METABOLIC PANEL
Anion gap: 11 (ref 5–15)
Anion gap: 12 (ref 5–15)
Anion gap: 8 (ref 5–15)
Anion gap: 9 (ref 5–15)
BUN: 10 mg/dL (ref 8–23)
BUN: 10 mg/dL (ref 8–23)
BUN: 11 mg/dL (ref 8–23)
BUN: 11 mg/dL (ref 8–23)
CO2: 19 mmol/L — ABNORMAL LOW (ref 22–32)
CO2: 22 mmol/L (ref 22–32)
CO2: 23 mmol/L (ref 22–32)
CO2: 23 mmol/L (ref 22–32)
Calcium: 7.2 mg/dL — ABNORMAL LOW (ref 8.9–10.3)
Calcium: 7.5 mg/dL — ABNORMAL LOW (ref 8.9–10.3)
Calcium: 7.6 mg/dL — ABNORMAL LOW (ref 8.9–10.3)
Calcium: 7.7 mg/dL — ABNORMAL LOW (ref 8.9–10.3)
Chloride: 103 mmol/L (ref 98–111)
Chloride: 108 mmol/L (ref 98–111)
Chloride: 109 mmol/L (ref 98–111)
Chloride: 109 mmol/L (ref 98–111)
Creatinine, Ser: 0.78 mg/dL (ref 0.44–1.00)
Creatinine, Ser: 0.92 mg/dL (ref 0.44–1.00)
Creatinine, Ser: 0.93 mg/dL (ref 0.44–1.00)
Creatinine, Ser: 1.02 mg/dL — ABNORMAL HIGH (ref 0.44–1.00)
GFR, Estimated: >60 mL/min (ref >60)
GFR, Estimated: >60 mL/min (ref >60)
GFR, Estimated: >60 mL/min (ref >60)
GFR, Estimated: >60 mL/min (ref >60)
Glucose, Bld: 140 mg/dL — ABNORMAL HIGH (ref 70–99)
Glucose, Bld: 215 mg/dL — ABNORMAL HIGH (ref 70–99)
Glucose, Bld: 227 mg/dL — ABNORMAL HIGH (ref 70–99)
Glucose, Bld: 275 mg/dL — ABNORMAL HIGH (ref 70–99)
Potassium: 3.6 mmol/L (ref 3.5–5.1)
Potassium: 3.6 mmol/L (ref 3.5–5.1)
Potassium: 4.8 mmol/L (ref 3.5–5.1)
Potassium: 4.8 mmol/L (ref 3.5–5.1)
Sodium: 138 mmol/L (ref 135–145)
Sodium: 138 mmol/L (ref 135–145)
Sodium: 140 mmol/L (ref 135–145)
Sodium: 140 mmol/L (ref 135–145)

## 2022-03-23 LAB — APTT
aPTT: 32 seconds (ref 24–36)
aPTT: 32 seconds (ref 24–36)
aPTT: 32 seconds (ref 24–36)
aPTT: 33 seconds (ref 24–36)

## 2022-03-23 LAB — COOXEMETRY PANEL
Carboxyhemoglobin: 1.1 % (ref 0.5–1.5)
Methemoglobin: <0.7 % (ref 0.0–1.5)
O2 Saturation: 65.8 %
Total hemoglobin: 9.3 g/dL — ABNORMAL LOW (ref 12.0–16.0)

## 2022-03-23 LAB — PREPARE PLATELET PHERESIS: Unit division: 0

## 2022-03-23 LAB — BPAM PLATELET PHERESIS
Blood Product Expiration Date: 202305102359
ISSUE DATE / TIME: 202305091944
Unit Type and Rh: 6200

## 2022-03-23 LAB — POCT ACTIVATED CLOTTING TIME
Activated Clotting Time: 245 seconds
Activated Clotting Time: 245 seconds

## 2022-03-23 LAB — LACTIC ACID, PLASMA
Lactic Acid, Venous: 1.8 mmol/L (ref 0.5–1.9)
Lactic Acid, Venous: 3.5 mmol/L (ref 0.5–1.9)

## 2022-03-23 LAB — GLUCOSE, CAPILLARY
Glucose-Capillary: 159 mg/dL — ABNORMAL HIGH (ref 70–99)
Glucose-Capillary: 189 mg/dL — ABNORMAL HIGH (ref 70–99)
Glucose-Capillary: 193 mg/dL — ABNORMAL HIGH (ref 70–99)
Glucose-Capillary: 231 mg/dL — ABNORMAL HIGH (ref 70–99)
Glucose-Capillary: 249 mg/dL — ABNORMAL HIGH (ref 70–99)
Glucose-Capillary: 250 mg/dL — ABNORMAL HIGH (ref 70–99)

## 2022-03-23 LAB — TROPONIN I (HIGH SENSITIVITY)
Troponin I (High Sensitivity): 2539 ng/L (ref <18)
Troponin I (High Sensitivity): 3592 ng/L (ref <18)

## 2022-03-23 LAB — PROCALCITONIN: Procalcitonin: 1.65 ng/mL

## 2022-03-23 MED ORDER — FENTANYL CITRATE PF 50 MCG/ML IJ SOSY: 25.0000 ug | PREFILLED_SYRINGE | INTRAMUSCULAR | Status: DC | PRN

## 2022-03-23 MED ORDER — SODIUM CHLORIDE 0.9% FLUSH
10.0000 mL | Freq: Two times a day (BID) | INTRAVENOUS | Status: DC
  Administered 2022-03-23: 40 mL
  Administered 2022-03-23 – 2022-03-25 (×4): 10 mL
  Administered 2022-03-25 – 2022-03-26 (×2): 40 mL
  Administered 2022-03-26: 10 mL
  Administered 2022-03-27: 40 mL
  Administered 2022-03-27 – 2022-03-29 (×5): 10 mL
  Administered 2022-03-30: 40 mL
  Administered 2022-03-30 – 2022-04-03 (×9): 10 mL
  Administered 2022-04-04: 30 mL

## 2022-03-23 MED ORDER — SODIUM CHLORIDE 0.9 % IV SOLN
INTRAVENOUS | Status: DC | PRN
Start: 2022-03-23 — End: 2022-05-01

## 2022-03-23 MED ORDER — HEPARIN (PORCINE) 25000 UT/250ML-% IV SOLN: 600.0000 [IU]/h | INTRAVENOUS | Status: DC

## 2022-03-23 MED ORDER — PERFLUTREN LIPID MICROSPHERE
1.0000 mL | INTRAVENOUS | Status: AC | PRN
  Administered 2022-03-23: 1 mL via INTRAVENOUS
  Filled 2022-03-23: qty 10

## 2022-03-23 MED ORDER — FENTANYL CITRATE PF 50 MCG/ML IJ SOSY
25.0000 ug | PREFILLED_SYRINGE | INTRAMUSCULAR | Status: DC | PRN
  Administered 2022-03-23 (×2): 100 ug via INTRAVENOUS
  Filled 2022-03-23 (×2): qty 2

## 2022-03-23 MED ORDER — ORAL CARE MOUTH RINSE
15.0000 mL | OROMUCOSAL | Status: DC
  Administered 2022-03-23 – 2022-04-03 (×108): 15 mL via OROMUCOSAL

## 2022-03-23 MED ORDER — ALBUMIN HUMAN 5 % IV SOLN
INTRAVENOUS | Status: AC
  Administered 2022-03-23: 12.5 g via INTRAVENOUS
  Filled 2022-03-23: qty 500

## 2022-03-23 MED ORDER — DOCUSATE SODIUM 50 MG/5ML PO LIQD
100.0000 mg | Freq: Two times a day (BID) | ORAL | Status: DC
  Administered 2022-03-26 – 2022-04-09 (×27): 100 mg
  Filled 2022-03-23 (×30): qty 10

## 2022-03-23 MED ORDER — DEXMEDETOMIDINE HCL IN NACL 400 MCG/100ML IV SOLN
0.4000 ug/kg/h | INTRAVENOUS | Status: DC
  Administered 2022-03-23: 0.4 ug/kg/h via INTRAVENOUS

## 2022-03-23 MED ORDER — SODIUM BICARBONATE 8.4 % IV SOLN: 50.0000 meq | Freq: Once | INTRAVENOUS | Status: AC

## 2022-03-23 MED ORDER — SODIUM CHLORIDE 0.9% FLUSH
10.0000 mL | INTRAVENOUS | Status: DC | PRN
  Administered 2022-04-16: 10 mL
  Administered 2022-04-21: 30 mL
  Administered 2022-04-25: 10 mL

## 2022-03-23 MED ORDER — SODIUM CHLORIDE 0.9 % IV SOLN
INTRAVENOUS | Status: DC | PRN
Start: 2022-03-23 — End: 2022-04-03

## 2022-03-23 MED ORDER — ROCURONIUM BROMIDE 10 MG/ML (PF) SYRINGE: 50.0000 mg | PREFILLED_SYRINGE | Freq: Once | INTRAVENOUS | Status: AC

## 2022-03-23 MED ORDER — ALBUMIN HUMAN 25 % IV SOLN: 25.0000 g | Freq: Four times a day (QID) | INTRAVENOUS | Status: DC

## 2022-03-23 MED ORDER — HEPARIN (PORCINE) 25000 UT/250ML-% IV SOLN
INTRAVENOUS | Status: AC
  Filled 2022-03-23: qty 250

## 2022-03-23 MED ORDER — ROCURONIUM BROMIDE 10 MG/ML (PF) SYRINGE
PREFILLED_SYRINGE | INTRAVENOUS | Status: AC
  Administered 2022-03-23: 50 mg via INTRAVENOUS
  Filled 2022-03-23: qty 10

## 2022-03-23 MED ORDER — POLYETHYLENE GLYCOL 3350 17 G PO PACK
17.0000 g | PACK | Freq: Every day | ORAL | Status: DC
  Administered 2022-03-27: 8.5 g
  Administered 2022-03-28 – 2022-03-29 (×2): 17 g
  Filled 2022-03-23 (×3): qty 1

## 2022-03-23 MED ORDER — PROPOFOL 1000 MG/100ML IV EMUL
0.0000 ug/kg/min | INTRAVENOUS | Status: DC
  Administered 2022-03-23: 5 ug/kg/min via INTRAVENOUS
  Administered 2022-03-23: 40 ug/kg/min via INTRAVENOUS
  Administered 2022-03-24: 35 ug/kg/min via INTRAVENOUS
  Filled 2022-03-23 (×4): qty 100

## 2022-03-23 MED ORDER — FENTANYL CITRATE PF 50 MCG/ML IJ SOSY: 50.0000 ug | PREFILLED_SYRINGE | Freq: Once | INTRAMUSCULAR | Status: AC

## 2022-03-23 MED ORDER — SUCCINYLCHOLINE CHLORIDE 200 MG/10ML IV SOSY
PREFILLED_SYRINGE | INTRAVENOUS | Status: AC
  Filled 2022-03-23: qty 10

## 2022-03-23 MED ORDER — ASPIRIN EC 81 MG PO TBEC: 81.0000 mg | DELAYED_RELEASE_TABLET | Freq: Every day | ORAL | Status: DC

## 2022-03-23 MED ORDER — ASPIRIN 81 MG PO CHEW
81.0000 mg | CHEWABLE_TABLET | Freq: Every day | ORAL | Status: DC
  Administered 2022-03-24 – 2022-05-01 (×39): 81 mg
  Filled 2022-03-23 (×39): qty 1

## 2022-03-23 MED ORDER — DEXMEDETOMIDINE HCL IN NACL 400 MCG/100ML IV SOLN
0.0000 ug/kg/h | INTRAVENOUS | Status: AC
  Administered 2022-03-25: 0.7 ug/kg/h via INTRAVENOUS
  Administered 2022-03-25: 1.2 ug/kg/h via INTRAVENOUS
  Filled 2022-03-23 (×3): qty 100

## 2022-03-23 MED ORDER — KETAMINE HCL 50 MG/5ML IJ SOSY
PREFILLED_SYRINGE | INTRAMUSCULAR | Status: AC
  Filled 2022-03-23: qty 5

## 2022-03-23 MED ORDER — ROCURONIUM BROMIDE 10 MG/ML (PF) SYRINGE
PREFILLED_SYRINGE | INTRAVENOUS | Status: AC
  Administered 2022-03-23: 100 mg
  Filled 2022-03-23: qty 10

## 2022-03-23 MED ORDER — ALBUMIN HUMAN 5 % IV SOLN
12.5000 g | Freq: Four times a day (QID) | INTRAVENOUS | Status: DC
  Administered 2022-03-23 – 2022-03-26 (×12): 12.5 g via INTRAVENOUS
  Filled 2022-03-23 (×11): qty 250

## 2022-03-23 MED ORDER — VECURONIUM BROMIDE 10 MG IV SOLR: 0.0800 mg/kg | INTRAVENOUS | Status: DC | PRN

## 2022-03-23 MED ORDER — ETOMIDATE 2 MG/ML IV SOLN
INTRAVENOUS | Status: AC
  Administered 2022-03-23: 20 mg
  Filled 2022-03-23: qty 20

## 2022-03-23 MED ORDER — ASPIRIN 81 MG PO CHEW: 324.0000 mg | CHEWABLE_TABLET | Freq: Once | ORAL | Status: DC

## 2022-03-23 MED ORDER — FENTANYL CITRATE PF 50 MCG/ML IJ SOSY
25.0000 ug | PREFILLED_SYRINGE | Freq: Once | INTRAMUSCULAR | Status: AC
  Administered 2022-03-23: 25 ug via INTRAVENOUS
  Filled 2022-03-23: qty 1

## 2022-03-23 MED ORDER — POTASSIUM CHLORIDE 10 MEQ/50ML IV SOLN
10.0000 meq | INTRAVENOUS | Status: AC
  Administered 2022-03-23 (×4): 10 meq via INTRAVENOUS
  Filled 2022-03-23 (×4): qty 50

## 2022-03-23 MED ORDER — POTASSIUM CHLORIDE 10 MEQ/100ML IV SOLN: 10.0000 meq | INTRAVENOUS | Status: DC

## 2022-03-23 MED ORDER — SODIUM BICARBONATE 8.4 % IV SOLN
INTRAVENOUS | Status: AC
  Administered 2022-03-23: 50 meq via INTRAVENOUS
  Filled 2022-03-23: qty 50

## 2022-03-23 MED ORDER — ARTIFICIAL TEARS OPHTHALMIC OINT
1.0000 "application " | TOPICAL_OINTMENT | Freq: Three times a day (TID) | OPHTHALMIC | Status: DC
  Administered 2022-03-23 – 2022-04-03 (×26): 1 via OPHTHALMIC
  Filled 2022-03-23 (×9): qty 3.5

## 2022-03-23 MED ORDER — POTASSIUM CHLORIDE 20 MEQ PO PACK
40.0000 meq | PACK | Freq: Once | ORAL | Status: AC
  Administered 2022-03-23: 40 meq
  Filled 2022-03-23: qty 2

## 2022-03-23 MED ORDER — MIDAZOLAM HCL 2 MG/2ML IJ SOLN
INTRAMUSCULAR | Status: AC
  Filled 2022-03-23: qty 2

## 2022-03-23 MED ORDER — FENTANYL BOLUS VIA INFUSION
25.0000 ug | INTRAVENOUS | Status: DC | PRN
  Administered 2022-03-23 – 2022-03-24 (×3): 50 ug via INTRAVENOUS
  Administered 2022-03-24: 100 ug via INTRAVENOUS
  Administered 2022-03-25: 50 ug via INTRAVENOUS
  Administered 2022-03-26 – 2022-03-28 (×3): 25 ug via INTRAVENOUS
  Filled 2022-03-23: qty 100

## 2022-03-23 MED ORDER — FUROSEMIDE 10 MG/ML IJ SOLN
10.0000 mg/h | INTRAVENOUS | Status: DC
  Administered 2022-03-23: 8 mg/h via INTRAVENOUS
  Administered 2022-03-24: 10 mg/h via INTRAVENOUS
  Filled 2022-03-23 (×4): qty 20

## 2022-03-23 MED ORDER — FENTANYL CITRATE PF 50 MCG/ML IJ SOSY
PREFILLED_SYRINGE | INTRAMUSCULAR | Status: AC
  Administered 2022-03-23: 50 ug
  Filled 2022-03-23: qty 2

## 2022-03-23 MED ORDER — DOCUSATE SODIUM 50 MG/5ML PO LIQD
100.0000 mg | Freq: Two times a day (BID) | ORAL | Status: DC
  Administered 2022-03-23 (×2): 100 mg
  Filled 2022-03-23: qty 10

## 2022-03-23 MED ORDER — NOREPINEPHRINE 16 MG/250ML-% IV SOLN
0.0000 ug/min | INTRAVENOUS | Status: DC
  Administered 2022-03-23: 2 ug/min via INTRAVENOUS
  Administered 2022-03-23: 18 ug/min via INTRAVENOUS
  Administered 2022-03-24: 13 ug/min via INTRAVENOUS
  Administered 2022-03-25: 6 ug/min via INTRAVENOUS
  Administered 2022-03-27: 1 ug/min via INTRAVENOUS
  Filled 2022-03-23 (×4): qty 250

## 2022-03-23 MED ORDER — ASPIRIN 81 MG PO CHEW
CHEWABLE_TABLET | ORAL | Status: AC
  Filled 2022-03-23: qty 4

## 2022-03-23 MED ORDER — FENTANYL 2500MCG IN NS 250ML (10MCG/ML) PREMIX INFUSION
25.0000 ug/h | INTRAVENOUS | Status: DC
  Administered 2022-03-23: 25 ug/h via INTRAVENOUS
  Administered 2022-03-24: 75 ug/h via INTRAVENOUS
  Administered 2022-03-25: 100 ug/h via INTRAVENOUS
  Administered 2022-03-27: 25 ug/h via INTRAVENOUS
  Filled 2022-03-23 (×4): qty 250

## 2022-03-23 MED ORDER — VASOPRESSIN 20 UNITS/100 ML INFUSION FOR SHOCK
0.0000 [IU]/min | INTRAVENOUS | Status: DC
  Administered 2022-03-23 (×2): 0.04 [IU]/min via INTRAVENOUS
  Filled 2022-03-23 (×2): qty 100

## 2022-03-23 MED ORDER — POLYETHYLENE GLYCOL 3350 17 G PO PACK
17.0000 g | PACK | Freq: Every day | ORAL | Status: DC
  Administered 2022-03-23: 17 g
  Filled 2022-03-23: qty 1

## 2022-03-23 MED ORDER — PIPERACILLIN-TAZOBACTAM 3.375 G IVPB
3.3750 g | Freq: Three times a day (TID) | INTRAVENOUS | Status: AC
  Administered 2022-03-23 – 2022-03-27 (×15): 3.375 g via INTRAVENOUS
  Filled 2022-03-23 (×15): qty 50

## 2022-03-23 MED ORDER — CHLORHEXIDINE GLUCONATE 0.12% ORAL RINSE (MEDLINE KIT)
15.0000 mL | Freq: Two times a day (BID) | OROMUCOSAL | Status: DC
  Administered 2022-03-23 – 2022-04-03 (×23): 15 mL via OROMUCOSAL

## 2022-03-23 MED ORDER — NALOXONE HCL 0.4 MG/ML IJ SOLN
0.4000 mg | INTRAMUSCULAR | Status: DC | PRN
  Administered 2022-03-23: 0.4 mg via INTRAVENOUS
  Filled 2022-03-23: qty 1

## 2022-03-23 MED ORDER — ASPIRIN 81 MG PO CHEW
324.0000 mg | CHEWABLE_TABLET | Freq: Once | ORAL | Status: AC
  Administered 2022-03-23: 324 mg

## 2022-03-23 MED FILL — Sodium Chloride IV Soln 0.9%: INTRAVENOUS | Qty: 1000 | Status: AC

## 2022-03-23 MED FILL — Sodium Chloride Irrigation Soln 0.9%: Qty: 3000 | Status: AC

## 2022-03-23 MED FILL — Heparin Sodium (Porcine) Inj 1000 Unit/ML: INTRAMUSCULAR | Qty: 30 | Status: AC

## 2022-03-23 NOTE — Progress Notes (Signed)
eLink Physician-Brief Progress Note ?Patient Name: Gabrielle [name] ?DOB: 04/30/1956 ?MRN: [id number] ? ? ?Date of Service ? 03/23/2022  ?HPI/Events of Note ? Patient with increasing oxygen requirements, PO2 is in the 50's, she's also been somewhat lethargic although there have been no focal findings per bedside RN, + tachycardia, K+ 3.6.  ?eICU Interventions ? Stat portable CXR ordered to r/o pneumonia, unfortunately she cannot comply with IS or Flutter valve at this time,  ABG result noted, BIPAP is also relatively contraindicated given her bowel issues, K+ repleted per E-Link electrolyte replacement protocol.  ? ? ? ?  ? ?Frederik Pear ?03/23/2022, 1:03 AM ?

## 2022-03-23 NOTE — Procedures (Signed)
Intubation Procedure Note ? ?Vena Rua  ?[id number]  ?Jul 30, 1956 ? ?Date:03/23/22  ?Time:9:48 AM  ? ?Provider Performing:Manning Luna C Tamala Julian  ? ? ?Procedure: Intubation (27741) ? ?Indication(s) ?Respiratory Failure ? ?Consent ?Unable to obtain consent due to emergent nature of procedure. ? ? ?Anesthesia ?Etomidate, Versed, Fentanyl, and Rocuronium ? ? ?Time Out ?Verified patient identification, verified procedure, site/side was marked, verified correct patient position, special equipment/implants available, medications/allergies/relevant history reviewed, required imaging and test results available. ? ? ?Sterile Technique ?Usual hand hygeine, masks, and gloves were used ? ? ?Procedure Description ?Patient positioned in bed supine.  Sedation given as noted above.  Patient was intubated with endotracheal tube using Glidescope.  View was Grade 1 full glottis .  Number of attempts was 1.  Colorimetric CO2 detector was consistent with tracheal placement. ?Copious secretions in glottis c/w likely aspiration ? ?Complications/Tolerance ?None; patient tolerated the procedure well. ?Chest X-ray is ordered to verify placement. ? ? ?EBL ?Minimal ? ? ?Specimen(s) ?None ? ?

## 2022-03-23 NOTE — Progress Notes (Signed)
eLink Physician-Brief Progress Note ?Patient Name: Gabrielle [name] ?DOB: 05/25/1956 ?MRN: [id number] ? ? ?Date of Service ? 03/23/2022  ?HPI/Events of Note ? Patient has new vascular appearing lesion on CXR, she remains hypoxemic.  ?eICU Interventions ? [place] ordered, CT chest with contrast ordered to evaluate vascular appearing lesion in the chest.  ? ? ? ?  ? ?Frederik Pear ?03/23/2022, 3:54 AM ?

## 2022-03-23 NOTE — Progress Notes (Signed)
? ?  NAME:  Gabrielle [name], MRN:  [id number], DOB:  18-Jun-1956, LOS: 2 ?ADMISSION DATE:  03/21/2022, CONSULTATION DATE:  03/22/22 ?REFERRING MD:  Unk Lightning, CHIEF COMPLAINT:  abd pain  ? ?History of Present Illness:  ?66 year old woman with hx of rectal cancer, DM2, HTN, HLD who presented for open aortobifemoral bypass by Dr. Unk Lightning for critical limb ischemia.  Postop course complicated by shock for which PCCM is consulted.  Patient underwent procedure with 14-7 bifurcated dacron graft, bilateral femoral endarterectomy late yesterday.  EBL 1.5L.  Currently c/o SOB and abdominal discomfort. ? ?Pertinent  Medical History  ?DM2- A1c 9.3% ?HTN ?HLD ?Severe PVD ? ?Significant Hospital Events: ?Including procedures, antibiotic start and stop dates in addition to other pertinent events   ?5/8  ?Aortobifemoral bypass using 14 x 7 bifurcated Dacron graft ?Bilateral femoral endarterectomy,  ?Left-sided profunda endarterectomy with bovine pericardial patch ?5/9 PCCM consult for hypotension ?5/10 worsening respiratory distress, intubated ?  ?Interim History / Subjective:  ?Somnolent throughout yesterday.  This am worsening encephalopathy, hypoxemia.  Required urgent intubation. ? ?Objective   ?Blood pressure 120/84, pulse (!) 151, temperature 98.7 ?F (37.1 ?C), temperature source Axillary, resp. rate 19, height '4\' 8"'$  (1.422 m), weight 59.6 kg, SpO2 95 %. ?   ?Vent Mode: PRVC ?FiO2 (%):  [100 %] 100 % ?Set Rate:  [20 bmp] 20 bmp ?Vt Set:  [400 mL] 400 mL ?PEEP:  [10 cmH20] 10 cmH20 ?Plateau Pressure:  [26 cmH20] 26 cmH20  ? ?Intake/Output Summary (Last 24 hours) at 03/23/2022 0709 ?Last data filed at 03/23/2022 0600 ?Gross per 24 hour  ?Intake 5129.4 ml  ?Output 2295 ml  ?Net 2834.4 ml  ? ? ?Filed Weights  ? 03/21/22 0548 03/22/22 0500  ?Weight: 53.5 kg 59.6 kg  ? ? ?Examination: ?Resting on vent ?Heart sounds tachycardic ?Abdomen protuberant hypoactive BS ?Crackles bases ?Mild anasarca developing ?Vascular exam deferred to  surgeons ?Incision sites look CDI ? ?CXR wet ?Cr holding steady ?CBC okay ?CBG okay ? ?Resolved Hospital Problem list   ?N/A ? ?Assessment & Plan:  ?Postoperative shock- seems mostly distributive related to intra-abdominal fluid shifts, very responsive to fluids.  Now complicated by fluid overload and probable aspiration pneumonia. ?PAD POD 1 Aortobifemoral bypass- lots of pain, otherwise seems to be doing okay, dopplerable pulses x 2. ?DM2- better controlled now ?HTN, HLD hx ? ?- Stop D5 crystalloid infusion ?- Stop levemir ?- Start zosyn, check tracheal aspirate ?- f/u limited echo and trop, ekg non-ischemic ?- Trend CBC, no e/o bleeding at present ?- Trend lactate ?- Vent bundle, PAD with precedex w/ fent pushes ? ?Best Practice (right click and "Reselect all SmartList Selections" daily)  ? ?Diet/type: NPO ?DVT prophylaxis: heparin ?GI prophylaxis: N/A ?Lines: Central line ?Foley:  Yes, and it is still needed ?Code Status:  full code ?Last date of multidisciplinary goals of care discussion [Tried to call spouse today, no answer] ? ?34 min cc time ?Erskine Emery MD PCCM ? ? ?

## 2022-03-23 NOTE — Progress Notes (Signed)
?  Echocardiogram ?2D Echocardiogram has been performed. ? ?Gabrielle [name] ?03/23/2022, 1:43 PM ?

## 2022-03-23 NOTE — Progress Notes (Signed)
Patient seen and examined multiple times throughout the day ?Intubated, sedated, neuromuscular blockade due to dysrhythmia on vent ?Requiring 2 pressors, vaso-, levo ?Fluid overloaded ?Tropes elevated-echo demonstrates global dysfunction ?EKG does not demonstrate STEMI, NSTEMI ?Erik continues to make urine, currently on light Lasix drip, albumin ?Hematocrit stable ? ?I had a long conversation with Oprah's husband, [name] regarding her status. ?Daci's status is critical. ?I am concerned that the fluid overload is led to global dysfunction. ?Further insult may result in mortality. ? ?Plan: ?Diuresis tolerated ?Wean pressors as tolerated ?I would not be surprised if Mccayla requires intubation lasting several days at this point ?Aspirin for global cardiac dysfunction. ?Appreciate the excellent care from the ICU staff as well as Dr. Tamala Julian ?

## 2022-03-23 NOTE — Progress Notes (Signed)
Inpatient Diabetes Program Recommendations ? ?AACE/ADA: New Consensus Statement on Inpatient Glycemic Control (2015) ? ?Target Ranges:  Prepandial:   less than 140 mg/dL ?     Peak postprandial:   less than 180 mg/dL (1-2 hours) ?     Critically ill patients:  140 - 180 mg/dL  ? ?Lab Results  ?Component Value Date  ? GLUCAP 249 (H) 03/23/2022  ? HGBA1C 9.2 (H) 03/14/2022  ? ? ?Review of Glycemic Control ? ?Diabetes history: DM2 ?Outpatient Diabetes medications: Farxiga 10 mg QD, metformin 1000 mg BID, Rybelsus 14 mg QD, Januvia 25 mg QD ?Current orders for Inpatient glycemic control: Novolog 0-15 Q4H. ? ?HgbA1C - 9.2% ? ?Inpatient Diabetes Program Recommendations:   ? ?Consider ICU protocol ?Novolog 2-6 units Q4H ? ?Thank you. ?Lorenda Peck, RD, LDN, CDE ?Inpatient Diabetes Coordinator ?[phone]  ? ? ? ? ? ? ? ? ?

## 2022-03-23 NOTE — Progress Notes (Addendum)
ANTICOAGULATION CONSULT NOTE - Initial Consult ? ?Pharmacy Consult for IV heparin ?Indication: chest pain/ACS ? ?No Known Allergies ? ?Patient Measurements: ?Height: '4\' 8"'$  (142.2 cm) ?Weight: 59.6 kg (131 lb 6.3 oz) ?IBW/kg (Calculated) : 36.3 ?Heparin Dosing Weight: 59 kg ? ?Vital Signs: ?Temp: 98.7 ?F (37.1 ?C) (05/10 0349) ?Temp Source: Axillary (05/10 0349) ?BP: 100/71 (05/10 1300) ?Pulse Rate: 112 (05/10 1330) ? ?Labs: ?Recent Labs  ?  03/22/22 ?2352 03/23/22 ?0008 03/23/22 ?0505 03/23/22 ?0507 03/23/22 ?0902 03/23/22 ?1001 03/23/22 ?1138 03/23/22 ?1320  ?HGB 9.3*   < > 9.1*   < >  --  8.5* 8.8* 9.2*  ?HCT 26.7*   < > 26.2*   < >  --  25.0* 26.3* 27.0*  ?PLT 130*  --  139*  --   --   --  128*  --   ?APTT 32  --  32  --   --   --  33  --   ?LABPROT 15.6*  --  15.5*  --   --   --  17.7*  --   ?INR 1.3*  --  1.2  --   --   --  1.5*  --   ?CREATININE 0.78  --  0.92  --   --   --  0.93  --   ?TROPONINIHS  --   --   --   --  2,539*  --  3,592*  --   ? < > = values in this interval not displayed.  ? ? ?Estimated Creatinine Clearance: 43.4 mL/min (by C-G formula based on SCr of 0.93 mg/dL). ? ? ?Medical History: ?Past Medical History:  ?Diagnosis Date  ? Cancer [facility]) 2010  ? rectal CA  ? Cataract   ? Diabetes mellitus without complication ([place])   ? [employer] 11/15/2020  ? Eczema   ? Hyperlipidemia   ? Hypertension   ? Intrinsic (urethral) sphincter deficiency (ISD)   ? RBBB   ? SUI (stress urinary incontinence, female)   ? hx of  ? Type 2 diabetes mellitus ([place])   ? Wears dentures   ? UPPER  ? Wears glasses   ? ? ?Medications:  ?Infusions:  ? sodium chloride    ? sodium chloride Stopped (03/23/22 0930)  ? albumin human 60 mL/hr at 03/23/22 1300  ? dexmedetomidine (PRECEDEX) IV infusion Stopped (03/23/22 1103)  ? fentaNYL infusion INTRAVENOUS 75 mcg/hr (03/23/22 1300)  ? furosemide (LASIX) 200 mg in dextrose 5% 100 mL ('2mg'$ /mL) infusion 16 mg/hr (03/23/22 1346)  ? heparin    ? magnesium sulfate bolus IVPB    ?  norepinephrine (LEVOPHED) Adult infusion 30 mcg/min (03/23/22 1300)  ? piperacillin-tazobactam (ZOSYN)  IV 12.5 mL/hr at 03/23/22 1300  ? propofol (DIPRIVAN) infusion 40 mcg/kg/min (03/23/22 1300)  ? vasopressin 0.04 Units/min (03/23/22 1300)  ? ? ?Assessment: ?67 yo femals s/p aortobifemoral bypass for critical limb ischemia.  Now with rising troponins and EF down on ECHO (formal read pending).  Pharmacy asked to begin IV heparin for possible ACS.  Also of note, pt requiring PRBCs overnight for dropping hemoglobin.  No obvious source of bleeding noted. ? ?Goal of Therapy:  ?Heparin level 0.3-0.5 ?Monitor platelets by anticoagulation protocol: Yes ?  ?Plan:  ?Start IV heparin without a bolus at rate of 600 units/hr. ?Check heparin level in 8 hrs ?Daily heparin level and CBC. ?F/u plans for further cardiac workup. ? ?[name], Pharm D, BCPS, BCCP ?Clinical Pharmacist ? 03/23/2022 1:49 PM  ? ?[facility] pharmacy  phone numbers are listed on [email or web address] ? ?Addendum: ?Per Dr. Tamala Julian, now felt to be demand ischemia, heparin IV stopped (was never started) ? ?[name], Pharm D, BCPS, BCCP ?Clinical Pharmacist ? 03/23/2022 2:23 PM  ? ?[facility] pharmacy phone numbers are listed on [email or web address] ? ? ? ?

## 2022-03-23 NOTE — Progress Notes (Signed)
Pharmacy Antibiotic Note ? ?[name] is a 66 y.o. female admitted on 03/21/2022 with critical limb ischemia.  Intubated this AM, now with suspicion for aspiration PNA.  Pharmacy has been consulted for Zosyn dosing. ? ?Plan: ?Zosyn 3.375g IV q8h (4 hour infusion). ? ?Height: '4\' 8"'$  (142.2 cm) ?Weight: 59.6 kg (131 lb 6.3 oz) ?IBW/kg (Calculated) : 36.3 ? ?Temp (24hrs), Avg:98.3 ?F (36.8 ?C), Min:97.8 ?F (36.6 ?C), Max:98.7 ?F (37.1 ?C) ? ?Recent Labs  ?Lab 03/22/22 ?0728 03/22/22 ?1203 03/22/22 ?1813 03/22/22 ?2352 03/23/22 ?0505  ?WBC 12.3* 10.5 9.7 13.0* 16.0*  ?CREATININE 1.00 0.87 0.73 0.78 0.92  ?LATICACIDVEN  --   --   --   --  1.8  ?  ?Estimated Creatinine Clearance: 43.9 mL/min (by C-G formula based on SCr of 0.92 mg/dL).   ? ?No Known Allergies ? ? ?Antimicrobials this admission:  ?Cefazolin 5/8 > 5/9 ?Zosyn 5/10 >  ? ?Dose adjustments this admission:  ? ?Microbiology results:  ?5/10 Resp Cx >  ? ? ? ?Thank you for allowing pharmacy to be a part of this patientGabrielles care. ? ?[name], Pharm D, BCPS, BCCP ?Clinical Pharmacist ? 03/23/2022 8:07 AM  ? ?[facility] pharmacy phone numbers are listed on [email or web address] ? ?

## 2022-03-23 NOTE — Progress Notes (Addendum)
?  Aortic Surgery Progress Note ? ? ? ?03/23/2022 ?8:07 AM ?2 Days Post-Op ? ?Subjective:  intubated this morning; a-line pulled out this am by pt ? ?Afebrile ?HR 120's-150's  ?90's-110's ?95% 100% FiO2 ? ?Gtts:  levophed and precedex ? ?Vitals:  ? 03/23/22 0600 03/23/22 0647  ?BP: 118/79 120/84  ?Pulse: (!) 137 (!) 151  ?Resp: (!) 25 19  ?Temp:    ?SpO2: (!) 87% 95%  ? ? ?Physical Exam: ?Cardiac:  tachycardic ?Lungs:  re-intubated this am ?Abdomen:  distended; +BM this am-no blood per RN; occasional BS ?Incisions:  all incisions look fine ?Extremities:  monophasic PT doppler signals bilaterally ?General:  sedated ? ?CBC ?   ?Component Value Date/Time  ? WBC 16.0 (H) 03/23/2022 0505  ? RBC 2.99 (L) 03/23/2022 0505  ? HGB 8.8 (L) 03/23/2022 0507  ? HGB 14.5 03/22/2021 0916  ? HCT 26.0 (L) 03/23/2022 0507  ? HCT 43.9 03/22/2021 0916  ? PLT 139 (L) 03/23/2022 0505  ? PLT 261 03/22/2021 0916  ? MCV 87.6 03/23/2022 0505  ? MCV 90 03/22/2021 0916  ? MCH 30.4 03/23/2022 0505  ? MCHC 34.7 03/23/2022 0505  ? RDW 14.5 03/23/2022 0505  ? RDW 12.5 03/22/2021 0916  ? LYMPHSABS 2.8 02/03/2015 0527  ? MONOABS 0.4 02/03/2015 0527  ? EOSABS 0.1 02/03/2015 0527  ? BASOSABS 0.0 02/03/2015 0527  ? ? ?BMET ?   ?Component Value Date/Time  ? NA 140 03/23/2022 0507  ? NA 137 03/22/2021 0916  ? K 4.6 03/23/2022 0507  ? CL 108 03/23/2022 0505  ? CO2 19 (L) 03/23/2022 0505  ? GLUCOSE 227 (H) 03/23/2022 0505  ? BUN 10 03/23/2022 0505  ? BUN 11 03/22/2021 0916  ? CREATININE 0.92 03/23/2022 0505  ? CREATININE 0.74 11/11/2015 1921  ? CALCIUM 7.6 (L) 03/23/2022 0505  ? GFRNONAA >60 03/23/2022 0505  ? [facility] >89 11/11/2015 1921  ? GFRAA 78 10/23/2020 1701  ? GFRAA >89 11/11/2015 1921  ? ? ?INR ?   ?Component Value Date/Time  ? INR 1.2 03/23/2022 0505  ? ? ? ?Intake/Output Summary (Last 24 hours) at 03/23/2022 0807 ?Last data filed at 03/23/2022 0600 ?Gross per 24 hour  ?Intake 4964.78 ml  ?Output 2085 ml  ?Net 2879.78 ml  ? ? ? ?Assessment/Plan:  66  y.o. female is s/p  ?Aortobifemoral bypass using 14 x 7 bifurcated Dacron graft ?Bilateral femoral endarterectomy,  ?Left-sided profunda endarterectomy with bovine pericardial patch ?2 Days Post-Op ? ?-Vascular:  PT doppler signals obtained this am bilaterally ?-Cardiac:  tachycardic and still requiring levophed ?-Pulmonary:  became agitated this am and required re-intubation; currently on 100% FiO2 ?-Neuro:  sedated ?-Renal:  BUN/Cr look good this am with good UOP; K+4.8 this am.   ?-GI:  +BM this am - no blood noted per RN; abdomen remains distended ?-Incisions:  all incisions look fine ?-Heme/ID:  afebrile; leukocytosis up this at at [facility].  Will continue to monitor.  Thrombocytopenia improved at 139k. Hgb down slightly this am at 8.8 from 9.1 but stable.  Continue to follow serial labs.  Abx started per CCM ?-General:  sedated. ? ?DVT prophylaxis-sq heparin started yesterday afternoon.  ? ? ?Leontine Locket, PA-C ?[facility] ?[phone] ?03/23/2022 ?8:07 AM ? ?VASCULAR STAFF ADDENDUM: ?I have independently interviewed and examined the patient. ?I agree with the above.  ? ? ?J. Melene Muller, MD ?[facility] ?Office Phone Number: [phone] ?03/23/2022 2:34 PM ? ? ?

## 2022-03-23 NOTE — Progress Notes (Signed)
OT Cancellation Note ? ?Patient Details ?Name: ARIENNE GARTIN ?MRN: [id number] ?DOB: [birth year] ? ? ?Cancelled Treatment:    Reason Eval/Treat Not Completed: Medical issues which prohibited therapy.  OT will continue efforts as appropriate. ? ?Shera Laubach D Shterna Laramee ?03/23/2022, 8:12 AM ?

## 2022-03-23 NOTE — Progress Notes (Signed)
PT Cancellation Note ? ?Patient Details ?Name: ANAPAULA SEVERT ?MRN: [id number] ?DOB: 05-03-1956 ? ? ?Cancelled Treatment:    Reason Eval/Treat Not Completed: Medical issues which prohibited therapy (worsening respiratory distress and pt now intubated). ? ?Wyona Almas, PT, DPT ?Acute Rehabilitation Services ?Pager [phone] ?Office [phone] ? ? ? ?Carloine Margo Aye ?03/23/2022, 8:07 AM ?

## 2022-03-23 NOTE — Progress Notes (Signed)
Patient was on 3L [place] at 1900 yesterday. Since then, patient's O2 requirements have increased to 15L HFNC, with an O2 sat of 87-91%.Levophed has also been restarted, currently at 30mg/min d/t drop in arterial line BP. E-Link physician ordered heated HFNC, will follow orders and monitor. If patient becomes more stable, will transport to CT for chest CT w/ contrast.  ?

## 2022-03-23 NOTE — Progress Notes (Signed)
? ?  VASCULAR SURGERY ASSESSMENT & PLAN:  ? ?ACUTE AGITATION: The patient became acutely agitated this morning.  Her saturations have dropped.  She is tachycardic and I think at this point she needs to be intubated.  Critical care medicine has been notified. ? ?Her urine output has been good and her renal function is normal.  She did respond to a small dose of Lasix last night.  This did not significantly help her saturation however. ? ?SUBJECTIVE:  ? ?Agitated. ? ?PHYSICAL EXAM:  ? ?Vitals:  ? 03/23/22 0455 03/23/22 0500 03/23/22 0530 03/23/22 0600  ?BP:  137/88  118/79  ?Pulse:  (!) 142 (!) 140 (!) 137  ?Resp:  (!) 26 (!) 25 (!) 25  ?Temp:      ?TempSrc:      ?SpO2: (!) 88% (!) 84% 94% (!) 87%  ?Weight:      ?Height:      ? ?Moves all extremities. ?Feet appear adequately perfused. ?Shallow respirations. ? ?LABS:  ? ?Lab Results  ?Component Value Date  ? WBC 16.0 (H) 03/23/2022  ? HGB 8.8 (L) 03/23/2022  ? HCT 26.0 (L) 03/23/2022  ? MCV 87.6 03/23/2022  ? PLT 139 (L) 03/23/2022  ? ?Lab Results  ?Component Value Date  ? CREATININE 0.92 03/23/2022  ? ?Lab Results  ?Component Value Date  ? INR 1.2 03/23/2022  ? ?CBG (last 3)  ?Recent Labs  ?  03/22/22 ?2213 03/22/22 ?2354 03/23/22 ?1517  ?GLUCAP 112* 151* 193*  ? ? ?PROBLEM LIST:   ? ?Principal Problem: ?  PAD (peripheral artery disease) ([place]) ?Active Problems: ?  Aortoiliac occlusive disease ([place]) ? ? ?CURRENT MEDS:  ? ? sodium chloride   Intravenous Once  ? Chlorhexidine Gluconate Cloth  6 each Topical Daily  ? docusate sodium  100 mg Oral Daily  ? heparin  5,000 Units Subcutaneous Q8H  ? insulin aspart  0-15 Units Subcutaneous Q4H  ? insulin detemir  8 Units Subcutaneous BID  ? pantoprazole  40 mg Oral Daily  ? pantoprazole (PROTONIX) IV  40 mg Intravenous QHS  ? ? ?Deitra Mayo ?Office: [phone] ?03/23/2022 ? ?

## 2022-03-23 NOTE — Procedures (Signed)
Arterial Catheter Insertion Procedure Note ? ?Vena Rua  ?[id number]  ?1956/09/20 ? ?Date:03/23/22  ?Time:9:50 AM  ? ? ?Provider Performing: Candee Furbish  ? ? ?Procedure: Insertion of Arterial Line [id number]) with US guidance (27741)  ? ?Indication(s) ?Blood pressure monitoring and/or need for frequent ABGs ? ?Consent ?Verbal, family at bedside consents ? ?Anesthesia ?None ? ? ?Time Out ?Verified patient identification, verified procedure, site/side was marked, verified correct patient position, special equipment/implants available, medications/allergies/relevant history reviewed, required imaging and test results available. ? ? ?Sterile Technique ?Maximal sterile technique including full sterile barrier drape, hand hygiene, sterile gown, sterile gloves, mask, hair covering, sterile ultrasound probe cover (if used). ? ? ?Procedure Description ?Area of catheter insertion was cleaned with chlorhexidine and draped in sterile fashion. With real-time ultrasound guidance an arterial catheter was placed into the right radial artery.  Appropriate arterial tracings confirmed on monitor.   ? ? ?Complications/Tolerance ?None; patient tolerated the procedure well. ? ? ?EBL ?Minimal ? ? ?Specimen(s) ?None ? ?

## 2022-03-23 NOTE — Progress Notes (Signed)
eLink Physician-Brief Progress Note ?Patient Name: Gabrielle [name] ?DOB: 1956-02-08 ?MRN: [id number] ? ? ?Date of Service ? 03/23/2022  ?HPI/Events of Note ? Patient cannot travel to CT on HHFNC, and on her current combination of non re-breather + High flow nasal cannula her saturation is 88 %, making her unstable for transport to CT right now, will place her on heated high flow nasal cannula right now and observe her for a bit before deciding on trip down to radiology for the CT scan.  ?eICU Interventions ? See above.  ? ? ? ?  ? ?Frederik Pear ?03/23/2022, 4:09 AM ?

## 2022-03-24 ENCOUNTER — Inpatient Hospital Stay (HOSPITAL_COMMUNITY): Payer: BC Managed Care – PPO

## 2022-03-24 DIAGNOSIS — I739 Peripheral vascular disease, unspecified: Secondary | ICD-10-CM | POA: Diagnosis not present

## 2022-03-24 LAB — CBC
HCT: 25 % — ABNORMAL LOW (ref 36.0–46.0)
HCT: 26.4 % — ABNORMAL LOW (ref 36.0–46.0)
Hemoglobin: 8.7 g/dL — ABNORMAL LOW (ref 12.0–15.0)
Hemoglobin: 9.3 g/dL — ABNORMAL LOW (ref 12.0–15.0)
MCH: 31.1 pg (ref 26.0–34.0)
MCH: 31.2 pg (ref 26.0–34.0)
MCHC: 34.8 g/dL (ref 30.0–36.0)
MCHC: 35.2 g/dL (ref 30.0–36.0)
MCV: 88.6 fL (ref 80.0–100.0)
MCV: 89.3 fL (ref 80.0–100.0)
Platelets: 133 10*3/uL — ABNORMAL LOW (ref 150–400)
Platelets: 134 10*3/uL — ABNORMAL LOW (ref 150–400)
RBC: 2.8 MIL/uL — ABNORMAL LOW (ref 3.87–5.11)
RBC: 2.98 MIL/uL — ABNORMAL LOW (ref 3.87–5.11)
RDW: 14.5 % (ref 11.5–15.5)
RDW: 14.5 % (ref 11.5–15.5)
WBC: 14.2 10*3/uL — ABNORMAL HIGH (ref 4.0–10.5)
WBC: 14.7 10*3/uL — ABNORMAL HIGH (ref 4.0–10.5)
nRBC: 0 % (ref 0.0–0.2)
nRBC: 0.1 % (ref 0.0–0.2)

## 2022-03-24 LAB — BASIC METABOLIC PANEL
Anion gap: 12 (ref 5–15)
Anion gap: 12 (ref 5–15)
Anion gap: 16 — ABNORMAL HIGH (ref 5–15)
BUN: 10 mg/dL (ref 8–23)
BUN: 8 mg/dL (ref 8–23)
BUN: 8 mg/dL (ref 8–23)
CO2: 25 mmol/L (ref 22–32)
CO2: 26 mmol/L (ref 22–32)
CO2: 26 mmol/L (ref 22–32)
Calcium: 7.3 mg/dL — ABNORMAL LOW (ref 8.9–10.3)
Calcium: 7.5 mg/dL — ABNORMAL LOW (ref 8.9–10.3)
Calcium: 7.7 mg/dL — ABNORMAL LOW (ref 8.9–10.3)
Chloride: 101 mmol/L (ref 98–111)
Chloride: 102 mmol/L (ref 98–111)
Chloride: 98 mmol/L (ref 98–111)
Creatinine, Ser: 1.09 mg/dL — ABNORMAL HIGH (ref 0.44–1.00)
Creatinine, Ser: 1.1 mg/dL — ABNORMAL HIGH (ref 0.44–1.00)
Creatinine, Ser: 1.21 mg/dL — ABNORMAL HIGH (ref 0.44–1.00)
GFR, Estimated: 50 mL/min — ABNORMAL LOW (ref >60)
GFR, Estimated: 56 mL/min — ABNORMAL LOW (ref >60)
GFR, Estimated: 56 mL/min — ABNORMAL LOW (ref >60)
Glucose, Bld: 143 mg/dL — ABNORMAL HIGH (ref 70–99)
Glucose, Bld: 151 mg/dL — ABNORMAL HIGH (ref 70–99)
Glucose, Bld: 253 mg/dL — ABNORMAL HIGH (ref 70–99)
Potassium: 3.7 mmol/L (ref 3.5–5.1)
Potassium: 4 mmol/L (ref 3.5–5.1)
Potassium: 4.3 mmol/L (ref 3.5–5.1)
Sodium: 139 mmol/L (ref 135–145)
Sodium: 139 mmol/L (ref 135–145)
Sodium: 140 mmol/L (ref 135–145)

## 2022-03-24 LAB — GLUCOSE, CAPILLARY
Glucose-Capillary: 150 mg/dL — ABNORMAL HIGH (ref 70–99)
Glucose-Capillary: 115 mg/dL — ABNORMAL HIGH (ref 70–99)
Glucose-Capillary: 161 mg/dL — ABNORMAL HIGH (ref 70–99)
Glucose-Capillary: 213 mg/dL — ABNORMAL HIGH (ref 70–99)
Glucose-Capillary: 241 mg/dL — ABNORMAL HIGH (ref 70–99)
Glucose-Capillary: 264 mg/dL — ABNORMAL HIGH (ref 70–99)
Glucose-Capillary: 234 mg/dL — ABNORMAL HIGH (ref 70–99)

## 2022-03-24 LAB — POCT I-STAT 7, (LYTES, BLD GAS, ICA,H+H)
Acid-Base Excess: 2 mmol/L (ref 0.0–2.0)
Acid-Base Excess: 2 mmol/L (ref 0.0–2.0)
Bicarbonate: 26.4 mmol/L (ref 20.0–28.0)
Bicarbonate: 26.3 mmol/L (ref 20.0–28.0)
Calcium, Ion: 0.95 mmol/L — ABNORMAL LOW (ref 1.15–1.40)
Calcium, Ion: 0.93 mmol/L — ABNORMAL LOW (ref 1.15–1.40)
HCT: 38 % (ref 36.0–46.0)
HCT: 24 % — ABNORMAL LOW (ref 36.0–46.0)
Hemoglobin: 12.9 g/dL (ref 12.0–15.0)
Hemoglobin: 8.2 g/dL — ABNORMAL LOW (ref 12.0–15.0)
O2 Saturation: 97 %
O2 Saturation: 96 %
Patient temperature: 98.8
Patient temperature: 100.7
Potassium: 3.5 mmol/L (ref 3.5–5.1)
Potassium: 4 mmol/L (ref 3.5–5.1)
Sodium: 140 mmol/L (ref 135–145)
Sodium: 138 mmol/L (ref 135–145)
TCO2: 28 mmol/L (ref 22–32)
TCO2: 27 mmol/L (ref 22–32)
pCO2 arterial: 39.5 mmHg (ref 32–48)
pCO2 arterial: 40.5 mmHg (ref 32–48)
pH, Arterial: 7.433 (ref 7.35–7.45)
pH, Arterial: 7.425 (ref 7.35–7.45)
pO2, Arterial: 85 mmHg (ref 83–108)
pO2, Arterial: 83 mmHg (ref 83–108)

## 2022-03-24 LAB — PROTIME-INR
INR: 1.2 (ref 0.8–1.2)
INR: 1.2 (ref 0.8–1.2)
Prothrombin Time: 15.2 seconds (ref 11.4–15.2)
Prothrombin Time: 15.5 seconds — ABNORMAL HIGH (ref 11.4–15.2)

## 2022-03-24 LAB — APTT
aPTT: 32 seconds (ref 24–36)
aPTT: 33 seconds (ref 24–36)
aPTT: 38 seconds — ABNORMAL HIGH (ref 24–36)
aPTT: 38 seconds — ABNORMAL HIGH (ref 24–36)

## 2022-03-24 LAB — LIPID PANEL
Cholesterol: 84 mg/dL (ref 0–200)
HDL: 13 mg/dL — ABNORMAL LOW (ref >40)
LDL Cholesterol: UNDETERMINED mg/dL (ref 0–99)
Total CHOL/HDL Ratio: 6.5 RATIO
Triglycerides: 499 mg/dL — ABNORMAL HIGH (ref <150)
VLDL: UNDETERMINED mg/dL (ref 0–40)

## 2022-03-24 LAB — POCT ACTIVATED CLOTTING TIME
Activated Clotting Time: 215 seconds
Activated Clotting Time: 257 seconds
Activated Clotting Time: 239 seconds

## 2022-03-24 LAB — HEPATIC FUNCTION PANEL
ALT: 16 U/L (ref 0–44)
AST: 68 U/L — ABNORMAL HIGH (ref 15–41)
Albumin: 3.9 g/dL (ref 3.5–5.0)
Alkaline Phosphatase: 44 U/L (ref 38–126)
Bilirubin, Direct: 0.4 mg/dL — ABNORMAL HIGH (ref 0.0–0.2)
Indirect Bilirubin: 2 mg/dL — ABNORMAL HIGH (ref 0.3–0.9)
Total Bilirubin: 2.4 mg/dL — ABNORMAL HIGH (ref 0.3–1.2)
Total Protein: 6.7 g/dL (ref 6.5–8.1)

## 2022-03-24 LAB — MAGNESIUM: Magnesium: 2 mg/dL (ref 1.7–2.4)

## 2022-03-24 LAB — TRIGLYCERIDES: Triglycerides: 495 mg/dL — ABNORMAL HIGH (ref <150)

## 2022-03-24 LAB — PHOSPHORUS: Phosphorus: <1 mg/dL — CL (ref 2.5–4.6)

## 2022-03-24 LAB — LDL CHOLESTEROL, DIRECT: Direct LDL: 20.4 mg/dL (ref 0–99)

## 2022-03-24 LAB — TROPONIN I (HIGH SENSITIVITY): Troponin I (High Sensitivity): 5007 ng/L (ref <18)

## 2022-03-24 MED ORDER — POTASSIUM PHOSPHATES 15 MMOLE/5ML IV SOLN
30.0000 mmol | Freq: Once | INTRAVENOUS | Status: DC
  Filled 2022-03-24: qty 10

## 2022-03-24 MED ORDER — PROSOURCE TF PO LIQD
45.0000 mL | Freq: Every day | ORAL | Status: DC
  Administered 2022-03-24 – 2022-03-29 (×6): 45 mL
  Filled 2022-03-24 (×6): qty 45

## 2022-03-24 MED ORDER — OSMOLITE 1.2 CAL PO LIQD
1000.0000 mL | ORAL | Status: DC
  Administered 2022-03-24 – 2022-03-25 (×2): 1000 mL
  Filled 2022-03-24 (×2): qty 1000

## 2022-03-24 MED ORDER — ACETAMINOPHEN 160 MG/5ML PO SOLN
325.0000 mg | Freq: Four times a day (QID) | ORAL | Status: DC | PRN
  Administered 2022-03-24 – 2022-04-15 (×5): 650 mg
  Filled 2022-03-24 (×6): qty 20.3

## 2022-03-24 MED ORDER — CALCIUM GLUCONATE-NACL 1-0.675 GM/50ML-% IV SOLN
1.0000 g | Freq: Once | INTRAVENOUS | Status: AC
  Administered 2022-03-24: 1000 mg via INTRAVENOUS
  Filled 2022-03-24: qty 50

## 2022-03-24 MED ORDER — VITAL HIGH PROTEIN PO LIQD: 1000.0000 mL | Freq: Every day | ORAL | Status: DC

## 2022-03-24 MED ORDER — HEPARIN SODIUM (PORCINE) 5000 UNIT/ML IJ SOLN
5000.0000 [IU] | Freq: Three times a day (TID) | INTRAMUSCULAR | Status: DC
  Administered 2022-03-24 – 2022-05-01 (×114): 5000 [IU] via SUBCUTANEOUS
  Filled 2022-03-24 (×111): qty 1

## 2022-03-24 MED ORDER — POTASSIUM PHOSPHATES 15 MMOLE/5ML IV SOLN
30.0000 mmol | Freq: Four times a day (QID) | INTRAVENOUS | Status: AC
  Administered 2022-03-24 (×2): 30 mmol via INTRAVENOUS
  Filled 2022-03-24 (×2): qty 10

## 2022-03-24 MED ORDER — MIDAZOLAM-SODIUM CHLORIDE 100-0.9 MG/100ML-% IV SOLN
0.0000 mg/h | INTRAVENOUS | Status: DC
  Administered 2022-03-24: 0.5 mg/h via INTRAVENOUS
  Administered 2022-03-25: 2 mg/h via INTRAVENOUS
  Filled 2022-03-24 (×2): qty 100

## 2022-03-24 NOTE — Progress Notes (Addendum)
? ?  NAME:  Gabrielle [name], MRN:  [id number], DOB:  02/08/1956, LOS: 3 ?ADMISSION DATE:  03/21/2022, CONSULTATION DATE:  03/22/22 ?REFERRING MD:  Unk Lightning, CHIEF COMPLAINT:  abd pain  ? ?History of Present Illness:  ?66 year old woman with hx of rectal cancer, DM2, HTN, HLD who presented for open aortobifemoral bypass by Dr. Unk Lightning for critical limb ischemia.  Postop course complicated by shock for which PCCM is consulted.  Patient underwent procedure with 14-7 bifurcated dacron graft, bilateral femoral endarterectomy late yesterday.  EBL 1.5L.  Currently c/o SOB and abdominal discomfort. ? ?Pertinent  Medical History  ?DM2- A1c 9.3% ?HTN ?HLD ?Severe PVD ? ?Significant Hospital Events: ?Including procedures, antibiotic start and stop dates in addition to other pertinent events   ?5/8  ?Aortobifemoral bypass using 14 x 7 bifurcated Dacron graft ?Bilateral femoral endarterectomy,  ?Left-sided profunda endarterectomy with bovine pericardial patch ?5/9 PCCM consult for hypotension ?5/10 worsening respiratory distress, intubated ?5/11 diuresis ?  ?Interim History / Subjective:  ?No events. ?Pressor requirements correlating with sedation level. ?Sedation needed to prevent derecruitment on vent. ?Starting to diurese. ? ?Objective   ?Blood pressure 108/61, pulse (!) 116, temperature 98.8 ?F (37.1 ?C), temperature source Axillary, resp. rate (!) 30, height '4\' 8"'$  (1.422 m), weight 60.1 kg, SpO2 98 %. ?CVP:  [8 mmHg-25 mmHg] 9 mmHg  ?Vent Mode: PCV ?FiO2 (%):  [60 %-100 %] 60 % ?Set Rate:  [20 bmp-28 bmp] 28 bmp ?Vt Set:  [290 mL-400 mL] 290 mL ?PEEP:  [8 cmH20-16 cmH20] 8 cmH20 ?Plateau Pressure:  [19 cmH20-26 cmH20] 20 cmH20  ? ?Intake/Output Summary (Last 24 hours) at 03/24/2022 0714 ?Last data filed at 03/24/2022 0700 ?Gross per 24 hour  ?Intake 2845.02 ml  ?Output 6005 ml  ?Net -3159.98 ml  ? ? ?Filed Weights  ? 03/21/22 0548 03/22/22 0500 03/24/22 0444  ?Weight: 53.5 kg 59.6 kg 60.1 kg  ? ? ?Examination: ?No distress opens  eyes to voice ?With sedation wean, follows simple commands ?Ext more warm today ?Lungs diminished bases ?Incision sites CDI ?Abdomen distended ? ?CXR pending ?Phos/K low being repleted ?ABG pending ?Remains on rate 28, PC with TV around 280-290cc (6cc/kg) ?H/H okay ? ?Resolved Hospital Problem list   ?N/A ? 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 ?PAD POD 2 Aortobifemoral bypass- open procedure, doing okay from doppler standpoint, keeping close eye on feet/toe viability ?DM2- seem okay for now 150s ?HTN, HLD hx ?Hyper-TG- correlated with PRIS, will have to switch from prop to versed ? ?- Continue zosyn, f/u tracheal aspirate ?- Check LFTs, if okay start statin ?- continue aspirin.  Consider stress test down the line ?- Trend CBC, no e/o bleeding at present ?- Replete K/Phos, continue lasix gtt as tolerated by renal function and BP ?- Vent bundle, PAD with versed and fent; can consider switching to precedex once vent mechanics improve (dyssynchrony required paralytics 5/10) ? ?Best Practice (right click and "Reselect all SmartList Selections" daily)  ? ?Diet/type: NPO, TF initiation per primary ?DVT prophylaxis: heparin ppx ?GI prophylaxis: N/A ?Lines: Central line ?Foley:  Yes, and it is still needed ?Code Status:  full code ?Last date of multidisciplinary goals of care discussion [updated at bedside 5/10] ? ?32 min cc time ?Erskine Emery MD PCCM ? ? ?

## 2022-03-24 NOTE — Progress Notes (Signed)
PT Cancellation Note ? ?Patient Details ?Name: Gabrielle [name] ?MRN: [id number] ?DOB: 05-Aug-1956 ? ? ?Cancelled Treatment:    Reason Eval/Treat Not Completed: Fatigue/lethargy limiting ability to participate;Patient not medically ready. RN requesting hold on PT this date as pt is still sedated and intubated. Will plan to follow-up another day as able. ? ? ?Moishe Spice, PT, DPT ?Acute Rehabilitation Services  ?Pager: [phone] ?Office: [phone] ? ? ? ?[name] ?03/24/2022, 8:10 AM ? ? ?

## 2022-03-24 NOTE — Progress Notes (Signed)
OT Cancellation Note ? ?Patient Details ?Name: LATERRA LUBINSKI ?MRN: [id number] ?DOB: [birth year] ? ? ?Cancelled Treatment:    Reason Eval/Treat Not Completed: Patient not medically ready.  OT to continue efforts as appropriate.   ? ?Niara Bunker D Damany Eastman ?03/24/2022, 9:06 AM ?03/24/2022 ? ?RP, OTR/L ? ?Acute Rehabilitation Services ? ?Office:  [phone] ? ?

## 2022-03-24 NOTE — Progress Notes (Signed)
Pharmacy Electrolyte Replacement ? ?Recent Labs: ?Ionized Ca remains low 0.93 ? ?Plan: Replace with 1gm calcium gluconate per [facility]ink electrolyte protocol. ? ?Sherlon Handing, PharmD, BCPS ?Please see amion for complete clinical pharmacist phone list ?03/24/2022 5:56 PM ?

## 2022-03-24 NOTE — Progress Notes (Addendum)
?Aortic Surgery Progress Note ? ? ? ?03/24/2022 ?6:55 AM ?3 Days Post-Op ? ?Subjective:  intubated/sedated ? ?Afebrile ?HR 110's-130's  ?42'H-062'B systolic ?76% .28BTD1 ? ?Gtts:   ?Propofol ?Fentanyl ?lasix ?levophed ? ?Vitals:  ? 03/24/22 0500 03/24/22 0600  ?BP: 107/60 111/62  ?Pulse: (!) 116 (!) 116  ?Resp: (!) 26 (!) 26  ?Temp:    ?SpO2: 96% 96%  ? ? ?Physical Exam: ?Cardiac:  tachycardic ?Lungs:  intubated on .76HYW7 ?Abdomen:  distended; +BS present ?Incisions:  midline and bilateral groins clean and healing ?Extremities:  much better bilateral PT doppler signals this morning ?General:  intubated and sedated; no distress ? ?CBC ?   ?Component Value Date/Time  ? WBC 14.7 (H) 03/24/2022 0450  ? RBC 2.80 (L) 03/24/2022 0450  ? HGB 8.7 (L) 03/24/2022 0450  ? HGB 14.5 03/22/2021 0916  ? HCT 25.0 (L) 03/24/2022 0450  ? HCT 43.9 03/22/2021 0916  ? PLT 133 (L) 03/24/2022 0450  ? PLT 261 03/22/2021 0916  ? MCV 89.3 03/24/2022 0450  ? MCV 90 03/22/2021 0916  ? MCH 31.1 03/24/2022 0450  ? MCHC 34.8 03/24/2022 0450  ? RDW 14.5 03/24/2022 0450  ? RDW 12.5 03/22/2021 0916  ? LYMPHSABS 2.8 02/03/2015 0527  ? MONOABS 0.4 02/03/2015 0527  ? EOSABS 0.1 02/03/2015 0527  ? BASOSABS 0.0 02/03/2015 0527  ? ? ?BMET ?   ?Component Value Date/Time  ? NA 139 03/24/2022 0450  ? NA 137 03/22/2021 0916  ? K 3.7 03/24/2022 0450  ? CL 101 03/24/2022 0450  ? CO2 26 03/24/2022 0450  ? GLUCOSE 143 (H) 03/24/2022 0450  ? BUN 8 03/24/2022 0450  ? BUN 11 03/22/2021 0916  ? CREATININE 1.09 (H) 03/24/2022 0450  ? CREATININE 0.74 11/11/2015 1921  ? CALCIUM 7.5 (L) 03/24/2022 0450  ? GFRNONAA 56 (L) 03/24/2022 0450  ? [facility] >89 11/11/2015 1921  ? GFRAA 78 10/23/2020 1701  ? GFRAA >89 11/11/2015 1921  ? ? ?INR ?   ?Component Value Date/Time  ? INR 1.2 03/24/2022 0450  ? ? ? ?Intake/Output Summary (Last 24 hours) at 03/24/2022 0655 ?Last data filed at 03/24/2022 0600 ?Gross per 24 hour  ?Intake 2601.91 ml  ?Output 6005 ml  ?Net -3403.09 ml   ? ? ? ?Assessment/Plan:  66 y.o. female is s/p  ?Aortobifemoral bypass using 14 x 7 bifurcated Dacron graft ?Bilateral femoral endarterectomy,  ?Left-sided profunda endarterectomy with bovine pericardial patch ?3 Days Post-Op ? ?-Vascular:  bilateral PT doppler signals much improved today. ?-Cardiac:  still requiring levophed for support; wean as tolerated.   ?-Pulmonary:  intubated ?-Neuro:  sedated ?-Renal:  continues to have good kidney function with good response to lasix gtt with good uop (>5L/24hr)   ?-GI:  abdomen is distended but +BS; may need to start TF today-will defer to CCM ?-Incisions:  incisions healing nicely.  Dry gauze placed in bilateral groins to wick moisture to help prevent wound infection.   ?-Heme/ID:  still with leukocytosis but stable at 14.7k.  on Zosyn. Thrombocytopenia stable hgb at 8.7 down slightly from last evening of 9.3 ?-General:  intubated and sedated.  ?-continue to check labs ?-discussed with Dr. Virl Cagey and will start sq heparin today for DVT prophylaxis ?-greatly appreciate CCM assistance  ? ? ?Leontine Locket, PA-C ?[facility] ?[phone] ?03/24/2022 ?6:55 AM ? ?VASCULAR STAFF ADDENDUM: ?I have independently interviewed and examined the patient. ?I agree with the above.  ?Pt with aggressive diuresis over the last 24h ?Single pressor ?  Excellent signals ? ?Overall looks better.  ?Discussed with ICU to hold on further diuresis  ?Continue to monitor UOP, wean pressor as tolerated.  ? ?J. Melene Muller, MD ?[facility] ?Office Phone Number: [phone] ?03/24/2022 11:01 AM ? ? ?

## 2022-03-24 NOTE — Progress Notes (Signed)
Initial Nutrition Assessment ? ?DOCUMENTATION CODES:  ? ?Not applicable ? ?INTERVENTION:  ? ?Tube Feeding via OG:  ?Initiate trickle TF today; Osmolite 1.2 at 20 ml/hr ?Goal regimen is Osmolite 1.2 at 60 ml/hr with Pro-Source TF 45 mL daily providing 1768 kcals, 91 g of protein and 1166 mL of free water  ? ?NUTRITION DIAGNOSIS:  ? ?Inadequate oral intake related to acute illness as evidenced by NPO status. ? ?GOAL:  ? ?Patient will meet greater than or equal to 90% of their needs ? ?MONITOR:  ? ?Vent status, Weight trends, Labs, TF tolerance ? ?REASON FOR ASSESSMENT:  ? ?Consult, Ventilator ?Enteral/tube feeding initiation and management ? ?ASSESSMENT:  ? ?66 yo female admitted for aortobifemoral bypass due to right-sided Rutherford 4 critical limb ischemia, left-sided Rutherford 3 critical limb ischemia.  Pt developed postop shock and respiratory failure requiring intubation.  PMH includes DM, HTN, HLD, severe PAD ? ?5/08 Aortobifemoral bypass using 14 x 7 bifurcated Dacron graft, Bilateral femoral endarterectomy,  Left-sided profunda endarterectomy with bovine pericardial patch ?5/09 PCCM consult for hypotension ?5/10 Worsening respiratory status requiring intubation ? ?Pt remains on vent support, requiring levophed at 13 mcg/min, off vasopressin. Pt sedated with fentanyl, versed, off propofol ? ?OG tube with tip in stomach per abd xray ? ?Unable to obtain diet and weight history from patient at this time.  ?NPO since admission ? ?Current wt 60.1 kg; weight 59.6 kg post op. Admit weight of 53.5 kg but unsure if measured or stated/estimated. Pt with some mild generalized edema, Net +5 L. UOP 5775 mL in 24 hours ? ?Labs: phosphorus <1.0 (L), CBGS 115-213 (ICU goal 140-180), Creatinine 1.09, BUN wdl, potassium wdl, magnesium wdl ?Meds: Kcl, potassium phosphate, ss novolog, colace, miralax ? ? ?NUTRITION - FOCUSED PHYSICAL EXAM: ? ?Unable to assess ? ?Diet Order:   ?Diet Order   ? ?       ?  Diet NPO time specified   Diet effective now       ?  ? ?  ?  ? ?  ? ? ?EDUCATION NEEDS:  ? ?Not appropriate for education at this time ? ?Skin:  Skin Assessment: Reviewed RN Assessment ? ?Last BM:  PTA ? ?Height:  ? ?Ht Readings from Last 1 Encounters:  ?03/21/22 '4\' 8"'$  (1.422 m)  ? ? ?Weight:  ? ?Wt Readings from Last 1 Encounters:  ?03/24/22 60.1 kg  ? ?BMI:  Body mass index is 29.71 kg/m?. ? ?Estimated Nutritional Needs:  ? ?Kcal:  1600-1800 kcals ? ?Protein:  80-100 g ? ?Fluid:  >/= 1.6 L ? ? ?Kerman Passey MS, RDN, LDN, CNSC ?Registered Dietitian III ?Clinical Nutrition ?RD Pager and On-Call Pager Number Located in [place]  ? ? ?

## 2022-03-25 ENCOUNTER — Inpatient Hospital Stay (HOSPITAL_COMMUNITY): Payer: BC Managed Care – PPO

## 2022-03-25 DIAGNOSIS — I739 Peripheral vascular disease, unspecified: Secondary | ICD-10-CM | POA: Diagnosis not present

## 2022-03-25 LAB — BASIC METABOLIC PANEL
Anion gap: 12 (ref 5–15)
Anion gap: 10 (ref 5–15)
Anion gap: 12 (ref 5–15)
BUN: 8 mg/dL (ref 8–23)
BUN: 9 mg/dL (ref 8–23)
BUN: 12 mg/dL (ref 8–23)
CO2: 27 mmol/L (ref 22–32)
CO2: 26 mmol/L (ref 22–32)
CO2: 24 mmol/L (ref 22–32)
Calcium: 8 mg/dL — ABNORMAL LOW (ref 8.9–10.3)
Calcium: 8.1 mg/dL — ABNORMAL LOW (ref 8.9–10.3)
Calcium: 8 mg/dL — ABNORMAL LOW (ref 8.9–10.3)
Chloride: 99 mmol/L (ref 98–111)
Chloride: 106 mmol/L (ref 98–111)
Chloride: 105 mmol/L (ref 98–111)
Creatinine, Ser: 0.88 mg/dL (ref 0.44–1.00)
Creatinine, Ser: 0.79 mg/dL (ref 0.44–1.00)
Creatinine, Ser: 0.74 mg/dL (ref 0.44–1.00)
GFR, Estimated: >60 mL/min (ref >60)
GFR, Estimated: >60 mL/min (ref >60)
GFR, Estimated: >60 mL/min (ref >60)
Glucose, Bld: 206 mg/dL — ABNORMAL HIGH (ref 70–99)
Glucose, Bld: 199 mg/dL — ABNORMAL HIGH (ref 70–99)
Glucose, Bld: 230 mg/dL — ABNORMAL HIGH (ref 70–99)
Potassium: 3.2 mmol/L — ABNORMAL LOW (ref 3.5–5.1)
Potassium: 4.7 mmol/L (ref 3.5–5.1)
Potassium: 3.9 mmol/L (ref 3.5–5.1)
Sodium: 138 mmol/L (ref 135–145)
Sodium: 142 mmol/L (ref 135–145)
Sodium: 141 mmol/L (ref 135–145)

## 2022-03-25 LAB — CBC
HCT: 21.6 % — ABNORMAL LOW (ref 36.0–46.0)
Hemoglobin: 7.4 g/dL — ABNORMAL LOW (ref 12.0–15.0)
MCH: 30.3 pg (ref 26.0–34.0)
MCHC: 34.3 g/dL (ref 30.0–36.0)
MCV: 88.5 fL (ref 80.0–100.0)
Platelets: 139 10*3/uL — ABNORMAL LOW (ref 150–400)
RBC: 2.44 MIL/uL — ABNORMAL LOW (ref 3.87–5.11)
RDW: 14.2 % (ref 11.5–15.5)
WBC: 12.2 10*3/uL — ABNORMAL HIGH (ref 4.0–10.5)
nRBC: 0 % (ref 0.0–0.2)

## 2022-03-25 LAB — APTT
aPTT: 38 seconds — ABNORMAL HIGH (ref 24–36)
aPTT: 39 seconds — ABNORMAL HIGH (ref 24–36)

## 2022-03-25 LAB — POCT I-STAT 7, (LYTES, BLD GAS, ICA,H+H)
Acid-Base Excess: 1 mmol/L (ref 0.0–2.0)
Bicarbonate: 23 mmol/L (ref 20.0–28.0)
Calcium, Ion: 1.05 mmol/L — ABNORMAL LOW (ref 1.15–1.40)
HCT: 28 % — ABNORMAL LOW (ref 36.0–46.0)
Hemoglobin: 9.5 g/dL — ABNORMAL LOW (ref 12.0–15.0)
O2 Saturation: 94 %
Patient temperature: 98.6
Potassium: 3.5 mmol/L (ref 3.5–5.1)
Sodium: 141 mmol/L (ref 135–145)
TCO2: 24 mmol/L (ref 22–32)
pCO2 arterial: 26.8 mmHg — ABNORMAL LOW (ref 32–48)
pH, Arterial: 7.54 — ABNORMAL HIGH (ref 7.35–7.45)
pO2, Arterial: 59 mmHg — ABNORMAL LOW (ref 83–108)

## 2022-03-25 LAB — HEMOGLOBIN AND HEMATOCRIT, BLOOD
HCT: 25.1 % — ABNORMAL LOW (ref 36.0–46.0)
Hemoglobin: 8.5 g/dL — ABNORMAL LOW (ref 12.0–15.0)

## 2022-03-25 LAB — GLUCOSE, CAPILLARY
Glucose-Capillary: 210 mg/dL — ABNORMAL HIGH (ref 70–99)
Glucose-Capillary: 210 mg/dL — ABNORMAL HIGH (ref 70–99)
Glucose-Capillary: 232 mg/dL — ABNORMAL HIGH (ref 70–99)
Glucose-Capillary: 206 mg/dL — ABNORMAL HIGH (ref 70–99)
Glucose-Capillary: 238 mg/dL — ABNORMAL HIGH (ref 70–99)
Glucose-Capillary: 192 mg/dL — ABNORMAL HIGH (ref 70–99)

## 2022-03-25 LAB — PROTIME-INR
INR: 1.1 (ref 0.8–1.2)
Prothrombin Time: 14.3 seconds (ref 11.4–15.2)

## 2022-03-25 LAB — CULTURE, RESPIRATORY W GRAM STAIN: Culture: NORMAL

## 2022-03-25 LAB — TROPONIN I (HIGH SENSITIVITY): Troponin I (High Sensitivity): 2820 ng/L (ref <18)

## 2022-03-25 LAB — PREPARE RBC (CROSSMATCH)

## 2022-03-25 LAB — PHOSPHORUS: Phosphorus: 1.9 mg/dL — ABNORMAL LOW (ref 2.5–4.6)

## 2022-03-25 LAB — MAGNESIUM: Magnesium: 1.7 mg/dL (ref 1.7–2.4)

## 2022-03-25 MED ORDER — OSMOLITE 1.2 CAL PO LIQD
1000.0000 mL | ORAL | Status: DC
Start: 2022-03-25 — End: 2022-03-26
  Administered 2022-03-25: 1000 mL
  Filled 2022-03-25 (×2): qty 1000

## 2022-03-25 MED ORDER — OXYCODONE HCL 5 MG PO TABS
5.0000 mg | ORAL_TABLET | Freq: Four times a day (QID) | ORAL | Status: DC
  Administered 2022-03-25 – 2022-03-27 (×8): 5 mg
  Filled 2022-03-25 (×8): qty 1

## 2022-03-25 MED ORDER — POTASSIUM CHLORIDE 10 MEQ/50ML IV SOLN
10.0000 meq | INTRAVENOUS | Status: AC
  Administered 2022-03-25 (×4): 10 meq via INTRAVENOUS
  Filled 2022-03-25 (×4): qty 50

## 2022-03-25 MED ORDER — FUROSEMIDE 10 MG/ML IJ SOLN
60.0000 mg | Freq: Once | INTRAMUSCULAR | Status: AC
  Administered 2022-03-25: 60 mg via INTRAVENOUS
  Filled 2022-03-25: qty 6

## 2022-03-25 MED ORDER — SODIUM PHOSPHATES 45 MMOLE/15ML IV SOLN
30.0000 mmol | Freq: Once | INTRAVENOUS | Status: AC
  Administered 2022-03-25: 30 mmol via INTRAVENOUS
  Filled 2022-03-25 (×3): qty 10

## 2022-03-25 MED ORDER — ROSUVASTATIN CALCIUM 20 MG PO TABS
40.0000 mg | ORAL_TABLET | Freq: Every day | ORAL | Status: DC
  Administered 2022-03-25 – 2022-05-01 (×38): 40 mg
  Filled 2022-03-25 (×38): qty 2

## 2022-03-25 MED ORDER — NALOXONE HCL 0.4 MG/ML IJ SOLN
INTRAMUSCULAR | Status: AC
  Administered 2022-03-25: 0.4 mg
  Filled 2022-03-25: qty 1

## 2022-03-25 MED ORDER — SODIUM CHLORIDE 0.9% IV SOLUTION: Freq: Once | INTRAVENOUS | Status: AC

## 2022-03-25 MED ORDER — POTASSIUM CHLORIDE 20 MEQ PO PACK
20.0000 meq | PACK | ORAL | Status: AC
  Administered 2022-03-25 (×2): 20 meq
  Filled 2022-03-25 (×2): qty 1

## 2022-03-25 MED ORDER — INSULIN GLARGINE-YFGN 100 UNIT/ML ~~LOC~~ SOLN
10.0000 [IU] | Freq: Every day | SUBCUTANEOUS | Status: DC
  Administered 2022-03-25 – 2022-03-27 (×3): 10 [IU] via SUBCUTANEOUS
  Filled 2022-03-25 (×3): qty 0.1

## 2022-03-25 MED ORDER — CLONAZEPAM 1 MG PO TABS
1.0000 mg | ORAL_TABLET | Freq: Two times a day (BID) | ORAL | Status: DC
  Administered 2022-03-25 – 2022-04-02 (×17): 1 mg
  Filled 2022-03-25 (×17): qty 1

## 2022-03-25 NOTE — Progress Notes (Signed)
?   03/25/22 2241  ?Therapy Vitals  ?Pulse Rate (!) 127  ?Resp (!) 42  ?Patient Position (if appropriate) Lying  ?MEWS Score/Color  ?MEWS Score 8  ?MEWS Score Color Red  ?Oxygen Therapy/Pulse Ox  ?O2 Device Ventilator  ?O2 Therapy Oxygen  ?O2 Flow Rate (L/min) 100 L/min  ?SpO2 (!) 83 %  ? ?Increased due to pt. Desaturation ? ?

## 2022-03-25 NOTE — Progress Notes (Signed)
eLink Physician-Brief Progress Note ?Patient Name: Gabrielle [name] ?DOB: 1956/04/27 ?MRN: [id number] ? ? ?Date of Service ? 03/25/2022  ?HPI/Events of Note ? Poorly responsive during sedation break ?Respiratory alkalosis  ?eICU Interventions ? Stat CT head ?Decrease resp rate on vent to 20  ? ? ? ?Intervention Category ?Major Interventions: Change in mental status - evaluation and management ? ?Mauri Brooklyn, P ?03/25/2022, 9:37 PM ?

## 2022-03-25 NOTE — Progress Notes (Signed)
Nutrition Follow-up ? ?DOCUMENTATION CODES:  ? ?Not applicable ? ?INTERVENTION:  ? ?Plan for Cortrak ? ?Tube Feeding via OG/Cortrak:  ?Osmolite 1.2 at 60 ml/hr ?Increase TF by 10 mL q 12 hours until goal rate of 60 ml/hr ?Pro-Source TF 45 mL daily ?Goal regimen provides 1768 kcals, 91 g of protein and 1166 mL of free water  ?  ? ?NUTRITION DIAGNOSIS:  ? ?Inadequate oral intake related to acute illness as evidenced by NPO status. ? ?Being addressed via TF  ? ?GOAL:  ? ?Patient will meet greater than or equal to 90% of their needs ? ?Progressing ? ?MONITOR:  ? ?Vent status, Weight trends, Labs, TF tolerance ? ?REASON FOR ASSESSMENT:  ? ?Consult, Ventilator ?Enteral/tube feeding initiation and management ? ?ASSESSMENT:  ? ?66 yo female admitted for aortobifemoral bypass due to right-sided Rutherford 4 critical limb ischemia, left-sided Rutherford 3 critical limb ischemia.  Pt developed postop shock and respiratory failure requiring intubation.  PMH includes DM, HTN, HLD, severe PAD ? ?5/08 Aortobifemoral bypass using 14 x 7 bifurcated Dacron graft, Bilateral femoral endarterectomy,  Left-sided profunda endarterectomy with bovine pericardial patch ?5/09 PCCM consult for hypotension ?5/10 Worsening respiratory status requiring intubation ?  ?Pt remains on vent support, pressor requirements down from yesterday, requiring levophed at 5, off vasopressin.  Pt on precedex and fentanyl ? ?Current wt 58.6 kg; down from 60 kg post op. UOP 3.1 L in 24 hours; net +5 L ? ?Tolerating trickle TF, Osmolite 1.2 at 20 m/hr ? ?+BMs, BS present, +flatus ? ?Labs: phosphorus 1.9 (L), CBGs 206-232 ?Meds: ss novolog, semglee, miralax, KCl, potassium phosphate, sodium phosphate ? ? ? ?Diet Order:   ?Diet Order   ? ?       ?  Diet NPO time specified  Diet effective now       ?  ? ?  ?  ? ?  ? ? ?EDUCATION NEEDS:  ? ?Not appropriate for education at this time ? ?Skin:  Skin Assessment: Skin Integrity Issues: ?Skin Integrity Issues:: DTI ?DTI:  perineum ? ?Last BM:  5/12 ? ?Height:  ? ?Ht Readings from Last 1 Encounters:  ?03/24/22 '4\' 9"'$  (1.448 m)  ? ? ?Weight:  ? ?Wt Readings from Last 1 Encounters:  ?03/25/22 58.6 kg  ? ? ? ?BMI:  Body mass index is 27.96 kg/m?. ? ?Estimated Nutritional Needs:  ? ?Kcal:  1600-1800 kcals ? ?Protein:  80-100 g ? ?Fluid:  >/= 1.6 L ? ? ?Kerman Passey MS, RDN, LDN, CNSC ?Registered Dietitian III ?Clinical Nutrition ?RD Pager and On-Call Pager Number Located in [place]  ? ?

## 2022-03-25 NOTE — Progress Notes (Signed)
Transported pt. From ICU to CT with no incident via vent. ?

## 2022-03-25 NOTE — Plan of Care (Signed)
?  Problem: Clinical Measurements: ?Goal: Ability to maintain clinical measurements within normal limits will improve ?Outcome: Progressing ?Goal: Respiratory complications will improve ?Outcome: Progressing ?Goal: Cardiovascular complication will be avoided ?Outcome: Progressing ?  ?Problem: Elimination: ?Goal: Will not experience complications related to bowel motility ?Outcome: Progressing ?Goal: Will not experience complications related to urinary retention ?Outcome: Progressing ?  ?

## 2022-03-25 NOTE — Progress Notes (Signed)
? ?NAME:  NIRALI MAGOUIRK, MRN:  [id number], DOB:  [birth year], LOS: 4 ?ADMISSION DATE:  03/21/2022, CONSULTATION DATE:  03/22/22 ?REFERRING MD:  Unk Lightning, CHIEF COMPLAINT:  abd pain  ? ?History of Present Illness:  ?66 year old woman with hx of rectal cancer, DM2, HTN, HLD who presented for open aortobifemoral bypass by Dr. Unk Lightning for critical limb ischemia.  Postop course complicated by shock for which PCCM is consulted.  Patient underwent procedure with 14-7 bifurcated dacron graft, bilateral femoral endarterectomy late yesterday.  EBL 1.5L.  Currently c/o SOB and abdominal discomfort. ? ?Pertinent  Medical History  ?DM2- A1c 9.3% ?HTN ?HLD ?Severe PVD ? ?Significant Hospital Events: ?Including procedures, antibiotic start and stop dates in addition to other pertinent events   ?5/8  ?Aortobifemoral bypass using 14 x 7 bifurcated Dacron graft ?Bilateral femoral endarterectomy,  ?Left-sided profunda endarterectomy with bovine pericardial patch ?5/9 PCCM consult for hypotension ?5/10 worsening respiratory distress, intubated ?5/11 diuresis ?  ?Interim History / Subjective:  ?Agitated as sedation lifted for SAT but not purposeful.  ? ?Objective   ?Blood pressure (!) 124/57, pulse (!) 120, temperature 98.3 ?F (36.8 ?C), temperature source Axillary, resp. rate (!) 28, height '4\' 9"'$  (1.448 m), weight 58.6 kg, SpO2 92 %. ?CVP:  [0 mmHg-17 mmHg] 17 mmHg  ?Vent Mode: PRVC ?FiO2 (%):  [50 %-55 %] 50 % ?Set Rate:  [28 bmp] 28 bmp ?Vt Set:  [300 mL] 300 mL ?PEEP:  [8 cmH20] 8 cmH20 ?Plateau Pressure:  [22 NTZ00-17 cmH20] 23 cmH20  ? ?Intake/Output Summary (Last 24 hours) at 03/25/2022 1011 ?Last data filed at 03/25/2022 0900 ?Gross per 24 hour  ?Intake 3313.39 ml  ?Output 1955 ml  ?Net 1358.39 ml  ? ?Filed Weights  ? 03/22/22 0500 03/24/22 0444 03/25/22 0500  ?Weight: 59.6 kg 60.1 kg 58.6 kg  ? ? ?Examination: ?General appearance: 66 y.o., female, intubated, sedated ?HENT: NCAT; MMM ?Lungs: rhonchorous bl, equal chest rise ?CV:  tachy, RR, no murmur  ?Abdomen: Soft, non-tender; distended   ?Extremities: No peripheral edema, warm ?Skin: Normal turgor and texture; no rash ?Psych: Appropriate affect ?Neuro: Alert and oriented to person and place, no focal deficit  ? ?PPV 6 with patient passive and tidal volumes 500 ? ?Labs/imaging reviewed ? ?Resolved Hospital Problem list   ?N/A ? 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 ?Aspiration pneumonia ?PAD POD 2 Aortobifemoral bypass- open procedure, doing okay from doppler standpoint, keeping close eye on feet/toe viability ?Anemia of critical illness ?Troponinemia-suspected demand in setting shock ?DM2- seem okay for now 150s ?HTN, HLD hx ?Hyper-TG- correlated with PRIS, will have to switch from prop to versed ? ?- Continue zosyn, f/u tracheal aspirate ?- start statin ?- continue aspirin.  consider stress test down the line ?- Replete K/Phos, continue lasix gtt as tolerated by renal function and BP ?- LTVV ?- Vent bundle, PAD with versed and fent. Transition to precedex as able for sedation. Start klonopin, oxycodone ?- trend cbc, transfuse for Hb 8 in setting suspected underlying CAD, may need diuretic later  ? ? ?Best Practice (right click and "Reselect all SmartList Selections" daily)  ? ?Diet/type: tubefeeds,  ?DVT prophylaxis: heparin ppx ?GI prophylaxis: N/A ?Lines: Central line ?Foley:  Yes, and it is still needed ?Code Status:  full code ?Last date of multidisciplinary goals of care discussion [will update this morning] ? ?35 min cc time ? ? ?Walker Shadow  ?  [place] ? ? ? ?

## 2022-03-25 NOTE — Progress Notes (Signed)
Pharmacy Antibiotic Note ? ?Gabrielle [name] is a 66 y.o. female admitted on 03/21/2022 with critical limb ischemia.  Intubated 5/10 AM, now with suspicion for aspiration PNA.  Pharmacy has been consulted for Zosyn dosing. ? ?5/12: Patient still having significant sputum output, Tm 100.8, WBC 12.2 (down), tachycardic   ? ?Plan: ?Zosyn 3.375g IV q8h (4 hour infusion). ?F/U DOT, clinical status  ? ?Height: '4\' 9"'$  (144.8 cm) ?Weight: 58.6 kg (129 lb 3 oz) ?IBW/kg (Calculated) : 38.6 ? ?Temp (24hrs), Avg:99 ?F (37.2 ?C), Min:97.8 ?F (36.6 ?C), Max:100.8 ?F (38.2 ?C) ? ?Recent Labs  ?Lab 03/23/22 ?0505 03/23/22 ?0902 03/23/22 ?1138 03/23/22 ?1713 03/23/22 ?2337 03/24/22 ?0450 03/24/22 ?1641 03/25/22 ?0148 03/25/22 ?1194  ?WBC 16.0*  --  14.2* 14.6* 14.2* 14.7*  --   --  12.2*  ?CREATININE 0.92  --  0.93 1.02* 1.10* 1.09* 1.21* 0.88  --   ?LATICACIDVEN 1.8 3.5*  --   --   --   --   --   --   --   ? ?  ?Estimated Creatinine Clearance: 46.9 mL/min (by C-G formula based on SCr of 0.88 mg/dL).   ? ?No Known Allergies ? ? ?Antimicrobials this admission:  ?Cefazolin 5/8 > 5/9 ?Zosyn 5/10 >  ? ?Microbiology results:  ?5/10 Resp Cx > Rare GPCs, rare yeast, no staph aureus/PsA  ? ?Adria Dill, PharmD ?PGY-1 Acute Care Resident  ?03/25/2022 10:56 AM  ? ? ?

## 2022-03-25 NOTE — Progress Notes (Addendum)
?Aortic Surgery Progress Note ? ? ? ?03/25/2022 ?6:45 AM ?4 Days Post-Op ? ?Subjective:  intubated and sedated but opens eyes to stimulus ? ?Afebrile ?HR 100's-110's ST ?56'O-130'Q systolic ?657% .84ONG2 ? ?Gtts:   ?Levophed ?Fentanyl ?midazolam ? ?Vitals:  ? 03/25/22 0344 03/25/22 0400  ?BP:  103/61  ?Pulse: (!) 104 (!) 104  ?Resp: (!) 28 (!) 28  ?Temp:    ?SpO2: 100% 100%  ? ? ?Physical Exam: ?Cardiac:  regular/tachy ?Lungs:  intubated ?Abdomen:  distended; appears tender to palpation ?Incisions:  all incisions look good ?Extremities:  continues to have brisk PT doppler signals bilaterally ?General:  resting in no distress ? ?CBC ?   ?Component Value Date/Time  ? WBC 12.2 (H) 03/25/2022 0509  ? RBC 2.44 (L) 03/25/2022 0509  ? HGB 7.4 (L) 03/25/2022 0509  ? HGB 14.5 03/22/2021 0916  ? HCT 21.6 (L) 03/25/2022 0509  ? HCT 43.9 03/22/2021 0916  ? PLT 139 (L) 03/25/2022 0509  ? PLT 261 03/22/2021 0916  ? MCV 88.5 03/25/2022 0509  ? MCV 90 03/22/2021 0916  ? MCH 30.3 03/25/2022 0509  ? MCHC 34.3 03/25/2022 0509  ? RDW 14.2 03/25/2022 0509  ? RDW 12.5 03/22/2021 0916  ? LYMPHSABS 2.8 02/03/2015 0527  ? MONOABS 0.4 02/03/2015 0527  ? EOSABS 0.1 02/03/2015 0527  ? BASOSABS 0.0 02/03/2015 0527  ? ? ?BMET ?   ?Component Value Date/Time  ? NA 138 03/25/2022 0148  ? NA 137 03/22/2021 0916  ? K 3.2 (L) 03/25/2022 0148  ? CL 99 03/25/2022 0148  ? CO2 27 03/25/2022 0148  ? GLUCOSE 206 (H) 03/25/2022 0148  ? BUN 8 03/25/2022 0148  ? BUN 11 03/22/2021 0916  ? CREATININE 0.88 03/25/2022 0148  ? CREATININE 0.74 11/11/2015 1921  ? CALCIUM 8.0 (L) 03/25/2022 0148  ? GFRNONAA >60 03/25/2022 0148  ? [facility] >89 11/11/2015 1921  ? GFRAA 78 10/23/2020 1701  ? GFRAA >89 11/11/2015 1921  ? ? ?INR ?   ?Component Value Date/Time  ? INR 1.1 03/25/2022 0133  ? ? ? ?Intake/Output Summary (Last 24 hours) at 03/25/2022 0645 ?Last data filed at 03/25/2022 0400 ?Gross per 24 hour  ?Intake 3040.32 ml  ?Output 3255 ml  ?Net -214.68 ml   ? ? ? ?Assessment/Plan:  66 y.o. female is s/p  ?Aortobifemoral bypass using 14 x 7 bifurcated Dacron graft ?Bilateral femoral endarterectomy,  ?Left-sided profunda endarterectomy with bovine pericardial patch ?4 Days Post-Op ? ?-Vascular:  continues to have brisk bilateral PT doppler signals ?-Cardiac:  still requiring pressor support.  Her Levophed was cut down overnight to 47mg/min from 173m/min.  Continue to wean per CCM as tolerated ?-Pulmonary:  remains intubated on .[id number]  ?-Neuro:  opens eyes to stimulus ?-Renal:  kidney function continues to look good with good uop.  Hypokalemia-is being supplemented.  She has 3 more runs of potassium.   ?-GI:  abdomen remains distended; appears tender to palpation when checking groin incisions. TF started yesterday.  NGT with no output.  ?-Incisions:  all incisions look good.  Continue dry gauze to bilateral groins to wick moisture to help prevent wound infection. ?-Heme/ID:  acute blood loss anemia-hgb trending downward and is 7.4 today down from 8.2.  thrombocytopenia stable at 139k.  Leukocytosis improving at 12.2 today down from 14.7. ? ? ? ?SaLeontine LocketPA-C ?[facility] ?[id number]/10/2022 ?6:45 AM ? ? ?VASCULAR STAFF ADDENDUM: ?I have independently interviewed and examined the patient. ?I agree with the  above.  ? ?Improving slowly.  ?Autodiuresing, Cr normal ?Minimal vent settings ?Weaning pressor ?Excellent signals distally ?Hct down, likely hemodilute. With cardiac event several days ago needs to be over 8hgb. ?I will defer post-transfusion diuresis to Critical care.  ?Husband updated this morning ? ?Appreciate excellent from nursing and critical care team.  ? ?J. Melene Muller, MD ?[facility] ?Office Phone Number: [phone] ?03/25/2022 8:11 AM ? ? ? ?

## 2022-03-25 NOTE — Progress Notes (Signed)
PT Cancellation/Discharge Note ? ?Patient Details ?Name: Gabrielle [name] ?MRN: [id number] ?DOB: [birth year] ? ? ?Cancelled Treatment:    Reason Eval/Treat Not Completed: Medical issues which prohibited therapy. Communicated with OT who reported the RN advised to continue to hold therapy.  PT will discontinue at this point.  Please re-consult PT when patient is appropriate and able to actively participate. ? ? ?Moishe Spice, PT, DPT ?Acute Rehabilitation Services  ?Pager: [phone] ?Office: [phone] ?  ? ? ?[name] ?03/25/2022, 10:29 AM ?

## 2022-03-25 NOTE — Progress Notes (Signed)
OT Cancellation Note ? ?Patient Details ?Name: Gabrielle [name] ?MRN: [id number] ?DOB: 06/05/1956 ? ? ?Cancelled Treatment:    Reason Eval/Treat Not Completed: Medical issues which prohibited therapy.  Spoke with RN, advised to continue to hold for OT eval.  OT will discontinue at this point.  Please consult when patient is able to actively participate with OT assessment and treatment.   ? ?Bular Hickok D Rheannon Cerney ?03/25/2022, 10:12 AM ?

## 2022-03-25 NOTE — Progress Notes (Signed)
[facility] ADULT ICU REPLACEMENT PROTOCOL ? ? ?The patient does apply for the [facility] Adult ICU Electrolyte Replacment Protocol based on the criteria listed below:  ? ?1.Exclusion criteria: TCTS patients, ECMO patients, and Dialysis patients ?2. Is GFR >/= 30 ml/min? Yes.    ?Patient's GFR today is >+60 ?3. Is SCr </= 2? Yes.   ?Patient's SCr is 0.88 mg/dL ?4. Did SCr increase >/= 0.5 in 24 hours? No. ?5.Pt's weight >40kg  Yes.   ?6. Abnormal electrolyte(s): K+ 3.2  ?7. Electrolytes replaced per protocol ?8.  Call MD STAT for K+ </= 2.5, Phos </= 1, or Mag </= 1 ?Physician:  n/a ? ?Gabrielle [name] 03/25/2022 3:43 AM ? ?

## 2022-03-26 ENCOUNTER — Inpatient Hospital Stay (HOSPITAL_COMMUNITY): Payer: BC Managed Care – PPO

## 2022-03-26 DIAGNOSIS — I739 Peripheral vascular disease, unspecified: Secondary | ICD-10-CM | POA: Diagnosis not present

## 2022-03-26 LAB — POCT I-STAT 7, (LYTES, BLD GAS, ICA,H+H)
Acid-Base Excess: 2 mmol/L (ref 0.0–2.0)
Bicarbonate: 25 mmol/L (ref 20.0–28.0)
Calcium, Ion: 1.09 mmol/L — ABNORMAL LOW (ref 1.15–1.40)
HCT: 28 % — ABNORMAL LOW (ref 36.0–46.0)
Hemoglobin: 9.5 g/dL — ABNORMAL LOW (ref 12.0–15.0)
O2 Saturation: 94 %
Patient temperature: 99
Potassium: 3.8 mmol/L (ref 3.5–5.1)
Sodium: 141 mmol/L (ref 135–145)
TCO2: 26 mmol/L (ref 22–32)
pCO2 arterial: 31.7 mmHg — ABNORMAL LOW (ref 32–48)
pH, Arterial: 7.506 — ABNORMAL HIGH (ref 7.35–7.45)
pO2, Arterial: 66 mmHg — ABNORMAL LOW (ref 83–108)

## 2022-03-26 LAB — BASIC METABOLIC PANEL
Anion gap: 11 (ref 5–15)
BUN: 14 mg/dL (ref 8–23)
CO2: 23 mmol/L (ref 22–32)
Calcium: 8.1 mg/dL — ABNORMAL LOW (ref 8.9–10.3)
Chloride: 103 mmol/L (ref 98–111)
Creatinine, Ser: 0.84 mg/dL (ref 0.44–1.00)
GFR, Estimated: >60 mL/min (ref >60)
Glucose, Bld: 259 mg/dL — ABNORMAL HIGH (ref 70–99)
Potassium: 3.5 mmol/L (ref 3.5–5.1)
Sodium: 137 mmol/L (ref 135–145)

## 2022-03-26 LAB — GLUCOSE, CAPILLARY
Glucose-Capillary: 184 mg/dL — ABNORMAL HIGH (ref 70–99)
Glucose-Capillary: 220 mg/dL — ABNORMAL HIGH (ref 70–99)
Glucose-Capillary: 256 mg/dL — ABNORMAL HIGH (ref 70–99)
Glucose-Capillary: 261 mg/dL — ABNORMAL HIGH (ref 70–99)
Glucose-Capillary: 263 mg/dL — ABNORMAL HIGH (ref 70–99)
Glucose-Capillary: 263 mg/dL — ABNORMAL HIGH (ref 70–99)
Glucose-Capillary: 266 mg/dL — ABNORMAL HIGH (ref 70–99)

## 2022-03-26 LAB — BPAM RBC
Blood Product Expiration Date: 202306062359
ISSUE DATE / TIME: 202305121314
Unit Type and Rh: 5100

## 2022-03-26 LAB — PROTIME-INR
INR: 1.2 (ref 0.8–1.2)
Prothrombin Time: 14.7 seconds (ref 11.4–15.2)

## 2022-03-26 LAB — TYPE AND SCREEN
ABO/RH(D): O POS
Antibody Screen: NEGATIVE
Unit division: 0

## 2022-03-26 LAB — CBC
HCT: 27.5 % — ABNORMAL LOW (ref 36.0–46.0)
Hemoglobin: 9 g/dL — ABNORMAL LOW (ref 12.0–15.0)
MCH: 29.1 pg (ref 26.0–34.0)
MCHC: 32.7 g/dL (ref 30.0–36.0)
MCV: 89 fL (ref 80.0–100.0)
Platelets: 160 10*3/uL (ref 150–400)
RBC: 3.09 MIL/uL — ABNORMAL LOW (ref 3.87–5.11)
RDW: 15.9 % — ABNORMAL HIGH (ref 11.5–15.5)
WBC: 11.5 10*3/uL — ABNORMAL HIGH (ref 4.0–10.5)
nRBC: 0 % (ref 0.0–0.2)

## 2022-03-26 LAB — CALCIUM, IONIZED: Calcium, Ionized, Serum: 4.3 mg/dL — ABNORMAL LOW (ref 4.5–5.6)

## 2022-03-26 LAB — MAGNESIUM: Magnesium: 1.7 mg/dL (ref 1.7–2.4)

## 2022-03-26 LAB — PHOSPHORUS: Phosphorus: 2 mg/dL — ABNORMAL LOW (ref 2.5–4.6)

## 2022-03-26 LAB — APTT: aPTT: 34 seconds (ref 24–36)

## 2022-03-26 LAB — LACTIC ACID, PLASMA: Lactic Acid, Venous: 1.4 mmol/L (ref 0.5–1.9)

## 2022-03-26 MED ORDER — MAGNESIUM SULFATE 4 GM/100ML IV SOLN
4.0000 g | Freq: Once | INTRAVENOUS | Status: AC
  Administered 2022-03-26: 4 g via INTRAVENOUS
  Filled 2022-03-26: qty 100

## 2022-03-26 MED ORDER — DEXMEDETOMIDINE HCL IN NACL 400 MCG/100ML IV SOLN
0.0000 ug/kg/h | INTRAVENOUS | Status: DC
  Administered 2022-03-26: 0.7 ug/kg/h via INTRAVENOUS
  Administered 2022-03-26: 0.5 ug/kg/h via INTRAVENOUS
  Administered 2022-03-28: 0.2 ug/kg/h via INTRAVENOUS
  Administered 2022-03-29: 0.5 ug/kg/h via INTRAVENOUS
  Filled 2022-03-26 (×4): qty 100

## 2022-03-26 MED ORDER — FUROSEMIDE 10 MG/ML IJ SOLN
80.0000 mg | Freq: Once | INTRAMUSCULAR | Status: AC
  Administered 2022-03-26: 80 mg via INTRAVENOUS
  Filled 2022-03-26: qty 8

## 2022-03-26 MED ORDER — POTASSIUM CHLORIDE 20 MEQ PO PACK
40.0000 meq | PACK | ORAL | Status: AC
  Administered 2022-03-26: 40 meq
  Filled 2022-03-26: qty 2

## 2022-03-26 MED ORDER — INSULIN ASPART 100 UNIT/ML IJ SOLN
4.0000 [IU] | INTRAMUSCULAR | Status: DC
  Administered 2022-03-26 – 2022-03-27 (×5): 4 [IU] via SUBCUTANEOUS

## 2022-03-26 MED ORDER — GUAIFENESIN-DM 100-10 MG/5ML PO SYRP: 15.0000 mL | ORAL_SOLUTION | ORAL | Status: DC | PRN

## 2022-03-26 MED ORDER — INSULIN ASPART 100 UNIT/ML IJ SOLN: 4.0000 [IU] | INTRAMUSCULAR | Status: DC

## 2022-03-26 MED ORDER — POTASSIUM CHLORIDE 20 MEQ PO PACK
40.0000 meq | PACK | ORAL | Status: DC
  Administered 2022-03-26: 40 meq via ORAL
  Filled 2022-03-26: qty 2

## 2022-03-26 MED ORDER — FUROSEMIDE 10 MG/ML IJ SOLN: 60.0000 mg | Freq: Once | INTRAMUSCULAR | Status: DC

## 2022-03-26 MED ORDER — OSMOLITE 1.2 CAL PO LIQD
1000.0000 mL | ORAL | Status: DC
Start: 2022-03-26 — End: 2022-03-29
  Administered 2022-03-26 – 2022-03-29 (×2): 1000 mL
  Filled 2022-03-26 (×2): qty 1000

## 2022-03-26 NOTE — Progress Notes (Signed)
[facility] [place] ? ?Subjective  -remains intubated and sedated.  Head CT obtained overnight after sedation was held and concern patient was not waking up.  Head CT with no acute intracranial abnormality. ? ? ?Objective ?117/69 ?90 ?100.2 ?F (37.9 ?C) (Oral) ?(!) 27 ?95% ? ?Intake/Output Summary (Last 24 hours) at 03/26/2022 0938 ?Last data filed at 03/26/2022 0900 ?Gross per 24 hour  ?Intake 2908.09 ml  ?Output 930 ml  ?Net 1978.09 ml  ? ? ?Intubated and sedated on vent ?GCS 3 T ?Abdomen significantly distended ?Brisk PT signals bilateral lower extremities ? ?Laboratory ?Lab Results: ?Recent Labs  ?  03/25/22 ?0177 03/25/22 ?1921 03/26/22 ?0459 03/26/22 ?9390  ?WBC 12.2*  --   --  11.5*  ?HGB 7.4*   < > 9.5* 9.0*  ?HCT 21.6*   < > 28.0* 27.5*  ?PLT 139*  --   --  160  ? < > = values in this interval not displayed.  ? ?BMET ?Recent Labs  ?  03/25/22 ?1657 03/25/22 ?2129 03/26/22 ?0036 03/26/22 ?0459  ?NA 141   < > 137 141  ?K 3.9   < > 3.5 3.8  ?CL 105  --  103  --   ?CO2 24  --  23  --   ?GLUCOSE 230*  --  259*  --   ?BUN 12  --  14  --   ?CREATININE 0.74  --  0.84  --   ?CALCIUM 8.0*  --  8.1*  --   ? < > = values in this interval not displayed.  ? ? ?COAG ?Lab Results  ?Component Value Date  ? INR 1.2 03/26/2022  ? INR 1.1 03/25/2022  ? INR 1.2 03/24/2022  ? ?No results found for: PTT ? ?Assessment/Planning: ? ?Postop day 5 status post aortobifemoral bypass including bilateral femoral endarterectomies.  Postop course complicated by postoperative shock ultimately requiring intubation. ? ?Neuro: GCS 3 T.  Head CT obtained overnight after sedation held with concern patient was not waking up.  No acute intracranial abnormality. ?CV: Remains on 6 mcg/min of Levophed.  Hemoglobin 9.  Echo on 03/23/2021 shows severely reduced EF of 20 to 25% compared to 55 to 60% preop.  Component of cardiogenic shock. ?Pulmonary: Intubated.  Ventilator weaned to 40% FiO2 and PEEP of 8.  Appreciate critical care  input. ?GI: Tube feeds at 40 mL an hour.  Significant abdominal distention.  Did order KUB.  Bedside nurse reports multiple bowel movements that were nonbloody. ?Renal: Patient has been getting aggressive diuresis from critical care.  Creatinine stable 0.84.  Urine output 1.035 L past 24 hours. ?ID: Zosyn given concern for aspiration pneumonia. ? ?Remains critically ill.  Appreciate critical care input. ? ?Marty Heck ?03/26/2022 ?9:38 AM ?-- ? ? ?

## 2022-03-26 NOTE — Plan of Care (Signed)

## 2022-03-26 NOTE — Progress Notes (Signed)
Per CCM guidance, decreased sedation as tolerated throughout shift. Patient's respiratory rate increased to approximately 30 breaths per minute in the afternoon. Spoke with CCM at bedside and received clarification for acceptable respiratory rate parameters: Goal of less than 35 breaths per minute. Sedation drips adjusted accordingly.  ?

## 2022-03-26 NOTE — Progress Notes (Signed)
? ?NAME:  Gabrielle [name], MRN:  [id number], DOB:  05-16-1956, LOS: 5 ?ADMISSION DATE:  03/21/2022, CONSULTATION DATE:  03/22/22 ?REFERRING MD:  Unk Lightning, CHIEF COMPLAINT:  abd pain  ? ?History of Present Illness:  ?66 year old woman with hx of rectal cancer, DM2, HTN, HLD who presented for open aortobifemoral bypass by Dr. Unk Lightning for critical limb ischemia.  Postop course complicated by shock for which PCCM is consulted.  Patient underwent procedure with 14-7 bifurcated dacron graft, bilateral femoral endarterectomy late yesterday.  EBL 1.5L.  Currently c/o SOB and abdominal discomfort. ? ?Pertinent  Medical History  ?DM2- A1c 9.3% ?HTN ?HLD ?Severe PVD ? ?Significant Hospital Events: ?Including procedures, antibiotic start and stop dates in addition to other pertinent events   ?5/8  ?Aortobifemoral bypass using 14 x 7 bifurcated Dacron graft ?Bilateral femoral endarterectomy,  ?Left-sided profunda endarterectomy with bovine pericardial patch ?5/9 PCCM consult for hypotension ?5/10 worsening respiratory distress, intubated ?5/11 diuresis ?  ?Interim History / Subjective:  ?Agitated as sedation lifted for SAT but not purposeful.  ? ?Objective   ?Blood pressure 122/74, pulse 90, temperature 100.2 ?F (37.9 ?C), temperature source Oral, resp. rate (!) 29, height '4\' 9"'$  (1.448 m), weight 60.8 kg, SpO2 95 %. ?CVP:  [16 mmHg-38 mmHg] 22 mmHg  ?Vent Mode: PRVC ?FiO2 (%):  [40 %-50 %] 40 % ?Set Rate:  [20 bmp-28 bmp] 20 bmp ?Vt Set:  [300 mL] 300 mL ?PEEP:  [8 cmH20] 8 cmH20 ?Plateau Pressure:  [23 BHA19-37 cmH20] 28 cmH20  ? ?Intake/Output Summary (Last 24 hours) at 03/26/2022 1124 ?Last data filed at 03/26/2022 1100 ?Gross per 24 hour  ?Intake 2867.98 ml  ?Output 885 ml  ?Net 1982.98 ml  ? ?Filed Weights  ? 03/24/22 0444 03/25/22 0500 03/26/22 0500  ?Weight: 60.1 kg 58.6 kg 60.8 kg  ? ? ?Examination: ?General appearance: 66 y.o., female, intubated, deeply sedated ?HENT: NCAT; MMM ?Lungs: rhonchorous bl, equal chest rise ?CV:  tachy, RR, no murmur  ?Abdomen: Soft, non-tender; distended   ?Extremities: No peripheral edema, warm ?Skin: Normal turgor and texture; no rash ?Psych: Appropriate affect ?Neuro:  ? ?Labs/imaging reviewed ? ?Resolved Hospital Problem list   ?N/A ? 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 Possible component of PRIS as below. Improving. ?Aspiration pneumonia  ?Abdominal distension- having bowel movements ?Acute metabolic encephalopathy- worry about anoxic injury in setting of shock vs metabolic encephalopathy ?PAD s/p Aortobifemoral bypass- open procedure, doing okay from doppler standpoint, keeping close eye on feet/toe viability ?Anemia of critical illness ?Troponinemia-suspected demand in setting shock ?DM2- seem okay for now 150s ?HTN, HLD hx ?Hyper-TG- correlated with PRIS, will have to switch from prop to versed ? ?- Continue zosyn, f/u tracheal aspirate ?- continue aspirin, atorvastatin.  Will likely need consideration of ischemic evaluation pending neurologic and renal recovery ?- attempt to diurese for net negative fluid balance ?- decrease TF, check lactic, correct electrolytes ?- LTVV ?- Vent bundle, PAD with precedex and fent. Transition to precedex with prn fentanyl as able for sedation. Continue klonopin, oxycodone ?- WUA ? ?Best Practice (right click and "Reselect all SmartList Selections" daily)  ? ?Diet/type: tubefeeds ?DVT prophylaxis: heparin ppx ?GI prophylaxis: N/A ?Lines: Central line ?Foley:  Yes, and it is still needed ?Code Status:  full code ?Last date of multidisciplinary goals of care discussion [Updated husband at bedside this morning] ? ?34 min cc time ? ? ?Ovid Curd  Sinclaire Artiga  ?[place] ? ? ? ?

## 2022-03-27 ENCOUNTER — Inpatient Hospital Stay (HOSPITAL_COMMUNITY): Payer: BC Managed Care – PPO

## 2022-03-27 DIAGNOSIS — I739 Peripheral vascular disease, unspecified: Secondary | ICD-10-CM | POA: Diagnosis not present

## 2022-03-27 LAB — GLUCOSE, CAPILLARY
Glucose-Capillary: 104 mg/dL — ABNORMAL HIGH (ref 70–99)
Glucose-Capillary: 130 mg/dL — ABNORMAL HIGH (ref 70–99)
Glucose-Capillary: 135 mg/dL — ABNORMAL HIGH (ref 70–99)
Glucose-Capillary: 188 mg/dL — ABNORMAL HIGH (ref 70–99)
Glucose-Capillary: 248 mg/dL — ABNORMAL HIGH (ref 70–99)
Glucose-Capillary: 272 mg/dL — ABNORMAL HIGH (ref 70–99)

## 2022-03-27 LAB — CBC
HCT: 28.9 % — ABNORMAL LOW (ref 36.0–46.0)
Hemoglobin: 9.3 g/dL — ABNORMAL LOW (ref 12.0–15.0)
MCH: 29.3 pg (ref 26.0–34.0)
MCHC: 32.2 g/dL (ref 30.0–36.0)
MCV: 91.2 fL (ref 80.0–100.0)
Platelets: 181 10*3/uL (ref 150–400)
RBC: 3.17 MIL/uL — ABNORMAL LOW (ref 3.87–5.11)
RDW: 15.6 % — ABNORMAL HIGH (ref 11.5–15.5)
WBC: 11 10*3/uL — ABNORMAL HIGH (ref 4.0–10.5)
nRBC: 0 % (ref 0.0–0.2)

## 2022-03-27 LAB — BASIC METABOLIC PANEL
Anion gap: 9 (ref 5–15)
BUN: 22 mg/dL (ref 8–23)
CO2: 26 mmol/L (ref 22–32)
Calcium: 8.8 mg/dL — ABNORMAL LOW (ref 8.9–10.3)
Chloride: 108 mmol/L (ref 98–111)
Creatinine, Ser: 1.07 mg/dL — ABNORMAL HIGH (ref 0.44–1.00)
GFR, Estimated: 58 mL/min — ABNORMAL LOW (ref >60)
Glucose, Bld: 131 mg/dL — ABNORMAL HIGH (ref 70–99)
Potassium: 3.9 mmol/L (ref 3.5–5.1)
Sodium: 143 mmol/L (ref 135–145)

## 2022-03-27 LAB — LACTIC ACID, PLASMA: Lactic Acid, Venous: 1.4 mmol/L (ref 0.5–1.9)

## 2022-03-27 LAB — PROTIME-INR
INR: 1.1 (ref 0.8–1.2)
Prothrombin Time: 14.3 seconds (ref 11.4–15.2)

## 2022-03-27 LAB — APTT: aPTT: 32 seconds (ref 24–36)

## 2022-03-27 LAB — PHOSPHORUS: Phosphorus: 2.6 mg/dL (ref 2.5–4.6)

## 2022-03-27 LAB — MAGNESIUM: Magnesium: 2.7 mg/dL — ABNORMAL HIGH (ref 1.7–2.4)

## 2022-03-27 MED ORDER — POTASSIUM CHLORIDE 20 MEQ PO PACK
20.0000 meq | PACK | Freq: Once | ORAL | Status: AC
  Administered 2022-03-27: 20 meq
  Filled 2022-03-27: qty 1

## 2022-03-27 MED ORDER — FUROSEMIDE 10 MG/ML IJ SOLN
100.0000 mg | Freq: Once | INTRAVENOUS | Status: AC
  Administered 2022-03-27: 100 mg via INTRAVENOUS
  Filled 2022-03-27: qty 10

## 2022-03-27 MED ORDER — INSULIN ASPART 100 UNIT/ML IJ SOLN: 6.0000 [IU] | INTRAMUSCULAR | Status: DC

## 2022-03-27 MED ORDER — INSULIN ASPART 100 UNIT/ML IJ SOLN
6.0000 [IU] | INTRAMUSCULAR | Status: DC
  Administered 2022-03-27 – 2022-03-30 (×4): 6 [IU] via SUBCUTANEOUS

## 2022-03-27 MED ORDER — INSULIN GLARGINE-YFGN 100 UNIT/ML ~~LOC~~ SOLN
12.0000 [IU] | Freq: Every day | SUBCUTANEOUS | Status: DC
  Administered 2022-03-28: 12 [IU] via SUBCUTANEOUS
  Filled 2022-03-27 (×2): qty 0.12

## 2022-03-27 NOTE — Progress Notes (Signed)
? ?NAME:  Gabrielle [name], MRN:  [id number], DOB:  Aug 20, 1956, LOS: 6 ?ADMISSION DATE:  03/21/2022, CONSULTATION DATE:  03/22/22 ?REFERRING MD:  Unk Lightning, CHIEF COMPLAINT:  abd pain  ? ?History of Present Illness:  ?66 year old woman with hx of rectal cancer, DM2, HTN, HLD who presented for open aortobifemoral bypass by Dr. Unk Lightning for critical limb ischemia.  Postop course complicated by shock for which PCCM is consulted.  Patient underwent procedure with 14-7 bifurcated dacron graft, bilateral femoral endarterectomy late yesterday.  EBL 1.5L.  Currently c/o SOB and abdominal discomfort. ? ?Pertinent  Medical History  ?DM2- A1c 9.3% ?HTN ?HLD ?Severe PVD ? ?Significant Hospital Events: ?Including procedures, antibiotic start and stop dates in addition to other pertinent events   ?5/8  ?Aortobifemoral bypass using 14 x 7 bifurcated Dacron graft ?Bilateral femoral endarterectomy,  ?Left-sided profunda endarterectomy with bovine pericardial patch ?5/9 PCCM consult for hypotension ?5/10 worsening respiratory distress, intubated ?5/11 diuresis ?  ?Interim History / Subjective:  ?Report that she transiently followed commands last night. ? ?Objective   ?Blood pressure (!) 99/58, pulse 80, temperature 98.1 ?F (36.7 ?C), temperature source Axillary, resp. rate 20, height '4\' 9"'$  (1.448 m), weight 60.8 kg, SpO2 95 %. ?CVP:  [12 mmHg-31 mmHg] 13 mmHg  ?Vent Mode: PRVC ?FiO2 (%):  [40 %-60 %] 50 % ?Set Rate:  [20 bmp] 20 bmp ?Vt Set:  [300 mL] 300 mL ?PEEP:  [8 cmH20] 8 cmH20 ?Plateau Pressure:  [24 cmH20-28 cmH20] 26 cmH20  ? ?Intake/Output Summary (Last 24 hours) at 03/27/2022 0841 ?Last data filed at 03/27/2022 0600 ?Gross per 24 hour  ?Intake 1469.8 ml  ?Output 1300 ml  ?Net 169.8 ml  ? ?Filed Weights  ? 03/25/22 0500 03/26/22 0500 03/27/22 0500  ?Weight: 58.6 kg 60.8 kg 60.8 kg  ? ? ?Examination: ?General appearance: 66 y.o., female, intubated ?HENT: NCAT; MMM ?Lungs: rhonchorous bl, equal chest rise ?CV: tachy, RR, no murmur   ?Abdomen: Soft, non-tender; distended and taut but peak pressures on vent =<25   ?Extremities: No peripheral edema, warm ?Skin: Normal turgor and texture; no rash ?Neuro: tries to track with a lot of encouragement, per RN able to stick tongue out to command ? ?Labs/imaging reviewed ? ?Resolved Hospital Problem list   ?N/A ? 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 Possible component of PRIS as below. Improving. ?Aspiration pneumonia  ?Abdominal distension- last bowel movement 5/13. Taut but peak pressures =<25. ?Acute metabolic encephalopathy- worry about anoxic injury in setting of shock vs metabolic encephalopathy ?PAD s/p Aortobifemoral bypass- open procedure, doing okay from doppler standpoint, keeping close eye on feet/toe viability ?Anemia of critical illness ?Troponinemia-suspected demand in setting shock ?DM2- seem okay for now 150s ?HTN, HLD hx ?Hyper-TG- correlated with PRIS, will have to switch from prop to versed ? ?- Continue zosyn 5d course ?- continue aspirin, atorvastatin.  Will likely need consideration of ischemic evaluation pending neurologic and renal recovery ?- attempt to diurese for net negative fluid balance ?- check KUB, lactic, continue trickle tube feeds for now, ramp up bowel regimen ?- LTVV ?- Vent bundle, PAD with precedex and prn fentanyl.  ?- continue klonopin, dc scheduled enteric oxy in setting ileus  ?- WUA ? ?Best Practice (right click and "Reselect all SmartList Selections" daily)  ? ?Diet/type: tubefeeds ?DVT prophylaxis: heparin ppx ?GI prophylaxis: N/A ?Lines: Central line ?Foley:  Yes, and it is still  needed ?Code Status:  full code ?Last date of multidisciplinary goals of care discussion Aletha Halim update husband later this morning] ? ?36 min cc time ? ? ?Walker Shadow  ?[place] ? ? ? ?

## 2022-03-27 NOTE — Progress Notes (Signed)
[facility] [place] ? ?Subjective  -no acute events overnight.  Remains intubated.  More awake this morning and opening her eyes. ? ? ?Objective ?(!) 99/58 ?80 ?98.1 ?F (36.7 ?C) (Axillary) ?20 ?95% ? ?Intake/Output Summary (Last 24 hours) at 03/27/2022 0940 ?Last data filed at 03/27/2022 0900 ?Gross per 24 hour  ?Intake 1380.49 ml  ?Output 1375 ml  ?Net 5.49 ml  ? ? ?Intubated on vent, opening her eyes ?Abdomen remains significantly distended ?Brisk PT signals bilateral lower extremities ? ?Laboratory ?Lab Results: ?Recent Labs  ?  03/26/22 ?9735 03/27/22 ?0510  ?WBC 11.5* 11.0*  ?HGB 9.0* 9.3*  ?HCT 27.5* 28.9*  ?PLT 160 181  ? ?BMET ?Recent Labs  ?  03/26/22 ?0036 03/26/22 ?0459 03/27/22 ?0510  ?NA 137 141 143  ?K 3.5 3.8 3.9  ?CL 103  --  108  ?CO2 23  --  26  ?GLUCOSE 259*  --  131*  ?BUN 14  --  22  ?CREATININE 0.84  --  1.07*  ?CALCIUM 8.1*  --  8.8*  ? ? ?COAG ?Lab Results  ?Component Value Date  ? INR 1.1 03/27/2022  ? INR 1.2 03/26/2022  ? INR 1.1 03/25/2022  ? ?No results found for: PTT ? ?Assessment/Planning: ? ?Postop day 6 status post aortobifemoral bypass including bilateral femoral endarterectomies.  Postop course complicated by postoperative shock ultimately requiring intubation. ? ?Neuro: More awake today, GCS 10 T.  Head CT obtained Friday night after sedation held with concern patient was not waking up.  No acute intracranial abnormality. ?CV: Levophed weaned from 6 mcg/min --> 2 mcg/min.  Hemoglobin 9 --> 9.3.  Echo on 03/23/2021 shows severely reduced EF of 20 to 25% compared to 55 to 60% preop.  Component of cardiogenic shock. ?Pulmonary: Intubated.  Ventilator weaned to 50% FiO2 and PEEP of 8.  Appreciate critical care input.  Continued diuresis yesterday and concern for mental status regarding plans for extubation. ?GI: Tube feeds decreased to 20 mL an hour yesterday to trickle given significant abdominal distention.  KUB shows dilated small bowel.  Repeat KUB today.    ?Renal: Patient has been getting aggressive diuresis from critical care.  80 mg lasix x2 yesterrday.  Creatinine 0.84 --> 1.07.  Urine output 1.35 L past 24 hours. ?ID: Zosyn given concern for aspiration pneumonia. ? ?Remains critically ill.  More awake today.   ? ?Marty Heck ?03/27/2022 ?9:40 AM ?-- ? ? ?

## 2022-03-28 ENCOUNTER — Inpatient Hospital Stay (HOSPITAL_COMMUNITY): Payer: BC Managed Care – PPO

## 2022-03-28 LAB — CBC
HCT: 30.8 % — ABNORMAL LOW (ref 36.0–46.0)
Hemoglobin: 9.7 g/dL — ABNORMAL LOW (ref 12.0–15.0)
MCH: 29 pg (ref 26.0–34.0)
MCHC: 31.5 g/dL (ref 30.0–36.0)
MCV: 92.2 fL (ref 80.0–100.0)
Platelets: 214 10*3/uL (ref 150–400)
RBC: 3.34 MIL/uL — ABNORMAL LOW (ref 3.87–5.11)
RDW: 15.2 % (ref 11.5–15.5)
WBC: 10.9 10*3/uL — ABNORMAL HIGH (ref 4.0–10.5)
nRBC: 0.2 % (ref 0.0–0.2)

## 2022-03-28 LAB — POCT I-STAT 7, (LYTES, BLD GAS, ICA,H+H)
Acid-Base Excess: 3 mmol/L — ABNORMAL HIGH (ref 0.0–2.0)
Bicarbonate: 27.8 mmol/L (ref 20.0–28.0)
Calcium, Ion: 1.18 mmol/L (ref 1.15–1.40)
HCT: 29 % — ABNORMAL LOW (ref 36.0–46.0)
Hemoglobin: 9.9 g/dL — ABNORMAL LOW (ref 12.0–15.0)
O2 Saturation: 97 %
Patient temperature: 98.6
Potassium: 3.9 mmol/L (ref 3.5–5.1)
Sodium: 144 mmol/L (ref 135–145)
TCO2: 29 mmol/L (ref 22–32)
pCO2 arterial: 40.1 mmHg (ref 32–48)
pH, Arterial: 7.448 (ref 7.35–7.45)
pO2, Arterial: 83 mmHg (ref 83–108)

## 2022-03-28 LAB — GLUCOSE, CAPILLARY
Glucose-Capillary: 106 mg/dL — ABNORMAL HIGH (ref 70–99)
Glucose-Capillary: 156 mg/dL — ABNORMAL HIGH (ref 70–99)
Glucose-Capillary: 160 mg/dL — ABNORMAL HIGH (ref 70–99)
Glucose-Capillary: 161 mg/dL — ABNORMAL HIGH (ref 70–99)
Glucose-Capillary: 184 mg/dL — ABNORMAL HIGH (ref 70–99)
Glucose-Capillary: 201 mg/dL — ABNORMAL HIGH (ref 70–99)

## 2022-03-28 LAB — BASIC METABOLIC PANEL
Anion gap: 12 (ref 5–15)
BUN: 29 mg/dL — ABNORMAL HIGH (ref 8–23)
CO2: 26 mmol/L (ref 22–32)
Calcium: 8.9 mg/dL (ref 8.9–10.3)
Chloride: 106 mmol/L (ref 98–111)
Creatinine, Ser: 1.14 mg/dL — ABNORMAL HIGH (ref 0.44–1.00)
GFR, Estimated: 53 mL/min — ABNORMAL LOW (ref >60)
Glucose, Bld: 153 mg/dL — ABNORMAL HIGH (ref 70–99)
Potassium: 4 mmol/L (ref 3.5–5.1)
Sodium: 144 mmol/L (ref 135–145)

## 2022-03-28 LAB — APTT: aPTT: 33 seconds (ref 24–36)

## 2022-03-28 LAB — PHOSPHORUS: Phosphorus: 3.5 mg/dL (ref 2.5–4.6)

## 2022-03-28 LAB — MAGNESIUM: Magnesium: 2.5 mg/dL — ABNORMAL HIGH (ref 1.7–2.4)

## 2022-03-28 NOTE — Progress Notes (Addendum)
?Aortic Surgery Progress Note ? ? ? ?03/28/2022 ?7:17 AM ?7 Days Post-Op ? ?Subjective:  intubated; opens eyes ? ?Afebrile ?HR 70's-90's ?540'G-867'Y systolic ?195% .09TOI7 ? ?Gtts:   ?Fentanyl ?Dexmedetomidine  ?Levophed is off ? ?Vitals:  ? 03/28/22 0500 03/28/22 0600  ?BP: 139/77 119/75  ?Pulse: 89 90  ?Resp: (!) 25 (!) 24  ?Temp:    ?SpO2: 99% 99%  ? ? ?Physical Exam: ?Cardiac:  regular ?Lungs:  intubated on .12WPY0 ?Abdomen:  distended; no bowel sounds heard ?Incisions:  bilateral groin and midline incisions healing nicely ?Extremities:  brisk DP/PT doppler signals bilaterally ?General:  resting with some sedation.  No distress ? ?CBC ?   ?Component Value Date/Time  ? WBC 10.9 (H) 03/28/2022 0333  ? RBC 3.34 (L) 03/28/2022 0333  ? HGB 9.7 (L) 03/28/2022 0333  ? HGB 9.9 (L) 03/28/2022 0333  ? HGB 14.5 03/22/2021 0916  ? HCT 30.8 (L) 03/28/2022 0333  ? HCT 29.0 (L) 03/28/2022 0333  ? HCT 43.9 03/22/2021 0916  ? PLT 214 03/28/2022 0333  ? PLT 261 03/22/2021 0916  ? MCV 92.2 03/28/2022 0333  ? MCV 90 03/22/2021 0916  ? MCH 29.0 03/28/2022 0333  ? MCHC 31.5 03/28/2022 0333  ? RDW 15.2 03/28/2022 0333  ? RDW 12.5 03/22/2021 0916  ? LYMPHSABS 2.8 02/03/2015 0527  ? MONOABS 0.4 02/03/2015 0527  ? EOSABS 0.1 02/03/2015 0527  ? BASOSABS 0.0 02/03/2015 0527  ? ? ?BMET ?   ?Component Value Date/Time  ? NA 144 03/28/2022 0333  ? NA 144 03/28/2022 0333  ? NA 137 03/22/2021 0916  ? K 4.0 03/28/2022 0333  ? K 3.9 03/28/2022 0333  ? CL 106 03/28/2022 0333  ? CO2 26 03/28/2022 0333  ? GLUCOSE 153 (H) 03/28/2022 0333  ? BUN 29 (H) 03/28/2022 0333  ? BUN 11 03/22/2021 0916  ? CREATININE 1.14 (H) 03/28/2022 0333  ? CREATININE 0.74 11/11/2015 1921  ? CALCIUM 8.9 03/28/2022 0333  ? GFRNONAA 53 (L) 03/28/2022 0333  ? [facility] >89 11/11/2015 1921  ? GFRAA 78 10/23/2020 1701  ? GFRAA >89 11/11/2015 1921  ? ? ?INR ?   ?Component Value Date/Time  ? INR 1.1 03/27/2022 0510  ? ? ? ?Intake/Output Summary (Last 24 hours) at 03/28/2022  0717 ?Last data filed at 03/28/2022 0600 ?Gross per 24 hour  ?Intake 998.34 ml  ?Output 947 ml  ?Net 51.34 ml  ? ? ? ?Assessment/Plan:  66 y.o. female is s/p  ?Aortobifemoral bypass using 14 x 7 bifurcated Dacron graft ?Bilateral femoral endarterectomy,  ?Left-sided profunda endarterectomy with bovine pericardial patch ?7 Days Post-Op ? ?-Vascular:  brisk bilateral DP/PT doppler signals present ?-Cardiac:  hemodynamically stable off of levophed.    Echo on 03/23/2021 shows severely reduced EF of 20 to 25% compared to 55 to 60% preop.  Component of cardiogenic shock. ?-Pulmonary:  remains intubated.  ?-Neuro:  opens eyes wiggles toes on right foot.  Head CT Friday night revealed no acute intracranial abnormality after concerns of pt not waking after sedation was held.   ?-Renal:  BUN/Cr increasing 29/1.14-will continue to monitor. ?-GI:  abdomen remains distended.  KUB yesterday revealed persistent gaseous distension of the small bowel loops and was not significantly changed from previous x-ray.  TF were decreased yesterday given distension.  May need suppository ?-Incisions:  all incisions are healing nicely.  Please keep dry gauze in bilateral groins to wick moisture.  ?-Heme/ID:  leukocytosis improving and pt afebrile; thrombocytopenia resolved.  On zosyn  for aspiration PNA ?-General:  no distress ? ? ?Leontine Locket, PA-C ?[facility] ?[phone] ?03/28/2022 ?7:17 AM ? ? ?I have seen and evaluated the patient. I agree with the PA note as documented above. Postop day 7 status post aortobifemoral bypass including bilateral femoral endarterectomies.  Postop course complicated by postoperative shock ultimately requiring intubation. ?  ?Neuro: Continues to be more awake.  Opening her eyes again today.  Head CT obtained Friday night after sedation held with concern patient was not waking up.  No acute intracranial abnormality. ?CV: Levophed weaned off.  Hemoglobin 9.3 --> 9.9.  Echo on 03/23/2021 shows  severely reduced EF of 20 to 25% compared to 55 to 60% preop.  Component of cardiogenic shock.  No indication for transfusion. ?Pulmonary: Intubated.  Ventilator weaned to 40% FiO2 and PEEP of 8.  Appreciate critical care input.  Continued diuresis.  Discussed with critical care yesterday evening and will continue spontaneous trials and work toward extubation. ?GI: Tube feeds decreased to 20 mL an hour Saturday to trickle given significant abdominal distention.  KUB shows dilated small bowel throughout the week.  Repeat KUB again today.  Getting bowel regimen with colace and miralax.  Not toxic to warrant CT at this time. ?Renal: Slight increase in Cr last 2 days now 1.07 --> 1.14 with aggressive diuresis, will monitor.   ?ID: Zosyn given concern for aspiration pneumonia. ?  ?Brisk PT signals bilaterally. ? ?Remains critically ill.  Updated husband at bedside. ?  ? ?Marty Heck, MD ?[facility] ?Office: [phone] ? ?

## 2022-03-28 NOTE — Progress Notes (Addendum)
eLink Physician-Brief Progress Note ?Patient Name: Gabrielle [name] ?DOB: 1956/03/15 ?MRN: [id number] ? ? ?Date of Service ? 03/28/2022  ?HPI/Events of Note ? Pt remains intubated on PEEP 8, 50% FiO2.   ?eICU Interventions ? ABG in AM ordered.   ? ? ? ?Intervention Category ?Minor Interventions: Other: ? ?Elsie Lincoln ?03/28/2022, 1:42 AM ? ?4:51 AM ?Notified of decreased urine output.  Pt has been diuresed and crea has gone up a bit to 1.1 <-- 1.07. ? ?ABG 7.448/40.1/83. ? ?Plan> ?Continue to monitor urine output.  ?Hold further diuretics.  ?Continue con current settings.  ?

## 2022-03-29 DIAGNOSIS — I739 Peripheral vascular disease, unspecified: Secondary | ICD-10-CM | POA: Diagnosis not present

## 2022-03-29 LAB — BASIC METABOLIC PANEL
Anion gap: 9 (ref 5–15)
BUN: 36 mg/dL — ABNORMAL HIGH (ref 8–23)
CO2: 25 mmol/L (ref 22–32)
Calcium: 8.8 mg/dL — ABNORMAL LOW (ref 8.9–10.3)
Chloride: 107 mmol/L (ref 98–111)
Creatinine, Ser: 1.21 mg/dL — ABNORMAL HIGH (ref 0.44–1.00)
GFR, Estimated: 50 mL/min — ABNORMAL LOW (ref >60)
Glucose, Bld: 235 mg/dL — ABNORMAL HIGH (ref 70–99)
Potassium: 3.9 mmol/L (ref 3.5–5.1)
Sodium: 141 mmol/L (ref 135–145)

## 2022-03-29 LAB — GLUCOSE, CAPILLARY
Glucose-Capillary: 219 mg/dL — ABNORMAL HIGH (ref 70–99)
Glucose-Capillary: 192 mg/dL — ABNORMAL HIGH (ref 70–99)
Glucose-Capillary: 193 mg/dL — ABNORMAL HIGH (ref 70–99)
Glucose-Capillary: 177 mg/dL — ABNORMAL HIGH (ref 70–99)
Glucose-Capillary: 205 mg/dL — ABNORMAL HIGH (ref 70–99)

## 2022-03-29 LAB — APTT: aPTT: 29 seconds (ref 24–36)

## 2022-03-29 MED ORDER — FENTANYL CITRATE PF 50 MCG/ML IJ SOSY
25.0000 ug | PREFILLED_SYRINGE | INTRAMUSCULAR | Status: DC | PRN
  Administered 2022-03-29 – 2022-04-01 (×8): 50 ug via INTRAVENOUS
  Filled 2022-03-29 (×8): qty 1

## 2022-03-29 MED ORDER — FENTANYL CITRATE PF 50 MCG/ML IJ SOSY
25.0000 ug | PREFILLED_SYRINGE | INTRAMUSCULAR | Status: DC | PRN
  Administered 2022-03-29: 25 ug via INTRAVENOUS
  Filled 2022-03-29: qty 1

## 2022-03-29 MED ORDER — DOCUSATE SODIUM 50 MG/5ML PO LIQD: 100.0000 mg | Freq: Two times a day (BID) | ORAL | Status: DC

## 2022-03-29 MED ORDER — VITAL AF 1.2 CAL PO LIQD
1000.0000 mL | ORAL | Status: DC
Start: 2022-03-29 — End: 2022-03-30
  Administered 2022-03-29: 1000 mL

## 2022-03-29 MED ORDER — BISACODYL 10 MG RE SUPP
10.0000 mg | Freq: Once | RECTAL | Status: DC
Start: 2022-03-29 — End: 2022-03-29

## 2022-03-29 MED ORDER — INSULIN GLARGINE-YFGN 100 UNIT/ML ~~LOC~~ SOLN
15.0000 [IU] | Freq: Every day | SUBCUTANEOUS | Status: DC
  Administered 2022-03-29 – 2022-03-30 (×2): 15 [IU] via SUBCUTANEOUS
  Filled 2022-03-29 (×3): qty 0.15

## 2022-03-29 MED ORDER — METOCLOPRAMIDE HCL 5 MG/ML IJ SOLN
10.0000 mg | Freq: Four times a day (QID) | INTRAMUSCULAR | Status: DC
  Administered 2022-03-29 – 2022-03-31 (×7): 10 mg via INTRAVENOUS
  Filled 2022-03-29 (×7): qty 2

## 2022-03-29 MED ORDER — PROSOURCE TF PO LIQD
45.0000 mL | Freq: Four times a day (QID) | ORAL | Status: DC
  Administered 2022-03-29 – 2022-03-30 (×3): 45 mL
  Filled 2022-03-29 (×4): qty 45

## 2022-03-29 MED ORDER — BISACODYL 10 MG RE SUPP
10.0000 mg | Freq: Once | RECTAL | Status: AC
  Administered 2022-03-29: 10 mg via RECTAL
  Filled 2022-03-29: qty 1

## 2022-03-29 MED ORDER — POLYETHYLENE GLYCOL 3350 17 G PO PACK
17.0000 g | PACK | Freq: Every day | ORAL | Status: DC
  Administered 2022-03-29 – 2022-04-09 (×6): 17 g
  Filled 2022-03-29 (×7): qty 1

## 2022-03-29 NOTE — Plan of Care (Signed)

## 2022-03-29 NOTE — Progress Notes (Signed)
Nutrition Follow-up ? ?DOCUMENTATION CODES:  ? ?Not applicable ? ?INTERVENTION:  ? ?Inadequate nutrition >/= 8 days; if unable to begin titration of TF to goal rate in next 24 hours, recommend consideration of TPN ? ?Recommend considering short-term trial of  reglan or erythromycin to aid in GI motility to hopefully fully resolve ileus and produce BM. Discussed with PCCM and plan to trial for a few days to see if this helps ? ?Plan for Cortrak when appropriate ? ?Tube Feeding via OG:  ?Change to Vital AF 1.2 at 20 ml/hr ?Goal rate is Vital AF 1.2 at 60 ml/hr  ?TF at goal rate 108 g of protein, 1728 kcals and 1166 mL of free water ? ?Increase Pro-Source TF 45 mL QID until able to titrate TF to goal rate. Each packet provides 11 g of protein and 40 kcals.  ? ? ?NUTRITION DIAGNOSIS:  ? ?Inadequate oral intake related to acute illness as evidenced by NPO status. ? ?Continues ? ?GOAL:  ? ?Patient will meet greater than or equal to 90% of their needs ? ?Not Met ? ?MONITOR:  ? ?Vent status, Weight trends, Labs, TF tolerance ? ?REASON FOR ASSESSMENT:  ? ?Consult, Ventilator ?Enteral/tube feeding initiation and management ? ?ASSESSMENT:  ? ?66 yo female admitted for aortobifemoral bypass due to right-sided Rutherford 4 critical limb ischemia, left-sided Rutherford 3 critical limb ischemia.  Pt developed postop shock and respiratory failure requiring intubation.  PMH includes DM, HTN, HLD, severe PAD ? ?5/08 Aortobifemoral bypass using 14 x 7 bifurcated Dacron graft, Bilateral femoral endarterectomy,  Left-sided profunda endarterectomy with bovine pericardial patch ?5/09 PCCM consult for hypotension ?5/10 Worsening respiratory status requiring intubation ?5/11 Trickle TF initiated ?5/12 Initiated TF titration ?5/13 TF decreased back to trickle TF given abd distention: abd xray with dilated SB ? ?Pt remains on vent support; on precedex and fentanyl gtt for sedation ? ?Osmolite 1.2 infusing at reduced rate of 20 ml/hr ? ?Abd  xray yesterday showed gas in colon suggestive of improving ileus; abd distention persists, no BM ? ?Plan for another dulcolax suppository today/  ? ?Labs: CBGs 106-219 (ICU goal 140-180) ?Meds: ss novolog, novolog q 4 hours, semglee, miralax ? ?Diet Order:   ?Diet Order   ? ?       ?  Diet NPO time specified  Diet effective now       ?  ? ?  ?  ? ?  ? ? ?EDUCATION NEEDS:  ? ?Not appropriate for education at this time ? ?Skin:  Skin Assessment: Skin Integrity Issues: ?Skin Integrity Issues:: DTI ?DTI: perineum ? ?Last BM:  5/15 small type 6, otherwise no recent BM ? ?Height:  ? ?Ht Readings from Last 1 Encounters:  ?03/24/22 4' 9"  (1.448 m)  ? ? ?Weight:  ? ?Wt Readings from Last 1 Encounters:  ?03/29/22 63.7 kg  ? ? ? ?BMI:  Body mass index is 30.39 kg/m?. ? ?Estimated Nutritional Needs:  ? ?Kcal:  1600-1800 kcals ? ?Protein:  80-100 g ? ?Fluid:  >/= 1.6 L ? ? ?Kerman Passey MS, RDN, LDN, CNSC ?Registered Dietitian III ?Clinical Nutrition ?RD Pager and On-Call Pager Number Located in [place]  ? ?

## 2022-03-29 NOTE — Progress Notes (Addendum)
eLink Physician-Brief Progress Note ?Patient Name: SHAWNIA VIZCARRONDO ?DOB: 06/29/1956 ?MRN: [id number] ? ? ?Date of Service ? 03/29/2022  ?HPI/Events of Note ? Pain and discomfort while on ventilator. Has PRN fentanyl orders in but those are to be given via infusion bag. Currently not on infusions.   ?eICU Interventions ? PRN fentanyl order placed back ?If still has discomfort, we will need to consider low dose precedex which she was on, earlier  ? ? ? ?Intervention Category ?Major Interventions: Delirium, psychosis, severe agitation - evaluation and management ? ?Gweneth Fredlund G Oneal Biglow ?03/29/2022, 8:16 PM ? ?Addendum at 12:55 am ?Notified of a rhythm change and RN had done an EKG- showing new changes, some with possible ST elevations when compared to prior multiple EKGS ?D/w ground team and PA at bedside, ordering cardiac labs as well as consulting cardiology  ? ?Addendum at 3 am ?Restless despite PRN fentanyl  ?Remains tachycardic too ?Add precedex  ? ?

## 2022-03-29 NOTE — Progress Notes (Addendum)
?Aortic Surgery Progress Note ? ? ? ?03/29/2022 ?6:53 AM ?8 Days Post-Op ? ?Subjective:  intubated/sedated ? ?Afebrile ?HR 70's-100's ?160'F-093'A systolic ?35% .[id number] ? ?Gtts:   ?Fentanyl ?Dexmedetomidine ?Osmolite 20cc/hr ? ?Vitals:  ? 03/29/22 0500 03/29/22 0600  ?BP: 120/75 119/69  ?Pulse: 86 79  ?Resp: (!) 25 (!) 25  ?Temp:    ?SpO2: 99% 98%  ? ? ?Physical Exam: ?Cardiac:  regular ?Lungs:  intubated on .40 FiO2 ?Abdomen:  continues to be distended; no bowel sounds heard.   ?Incisions:  all incisions continue to heal ?Extremities:  brisk PT doppler signals bilaterally ?General:  intubated/sedated. ? ?CBC ?   ?Component Value Date/Time  ? WBC 10.9 (H) 03/28/2022 0333  ? RBC 3.34 (L) 03/28/2022 0333  ? HGB 9.7 (L) 03/28/2022 0333  ? HGB 9.9 (L) 03/28/2022 0333  ? HGB 14.5 03/22/2021 0916  ? HCT 30.8 (L) 03/28/2022 0333  ? HCT 29.0 (L) 03/28/2022 0333  ? HCT 43.9 03/22/2021 0916  ? PLT 214 03/28/2022 0333  ? PLT 261 03/22/2021 0916  ? MCV 92.2 03/28/2022 0333  ? MCV 90 03/22/2021 0916  ? MCH 29.0 03/28/2022 0333  ? MCHC 31.5 03/28/2022 0333  ? RDW 15.2 03/28/2022 0333  ? RDW 12.5 03/22/2021 0916  ? LYMPHSABS 2.8 02/03/2015 0527  ? MONOABS 0.4 02/03/2015 0527  ? EOSABS 0.1 02/03/2015 0527  ? BASOSABS 0.0 02/03/2015 0527  ? ? ?BMET ?   ?Component Value Date/Time  ? NA 141 03/29/2022 0410  ? NA 137 03/22/2021 0916  ? K 3.9 03/29/2022 0410  ? CL 107 03/29/2022 0410  ? CO2 25 03/29/2022 0410  ? GLUCOSE 235 (H) 03/29/2022 0410  ? BUN 36 (H) 03/29/2022 0410  ? BUN 11 03/22/2021 0916  ? CREATININE 1.21 (H) 03/29/2022 0410  ? CREATININE 0.74 11/11/2015 1921  ? CALCIUM 8.8 (L) 03/29/2022 0410  ? GFRNONAA 50 (L) 03/29/2022 0410  ? [facility] >89 11/11/2015 1921  ? GFRAA 78 10/23/2020 1701  ? GFRAA >89 11/11/2015 1921  ? ? ?INR ?   ?Component Value Date/Time  ? INR 1.1 03/27/2022 0510  ? ? ? ?Intake/Output Summary (Last 24 hours) at 03/29/2022 0653 ?Last data filed at 03/29/2022 0545 ?Gross per 24 hour  ?Intake 865.98 ml  ?Output  815 ml  ?Net 50.98 ml  ? ? ? ?Assessment/Plan:  66 y.o. female is s/p  ?Aortobifemoral bypass using 14 x 7 bifurcated Dacron graft ?Bilateral femoral endarterectomy,  ?Left-sided profunda endarterectomy with bovine pericardial patch ?8 Days Post-Op ? ?-Vascular:  pt continues to have brisk doppler signals bilaterally ?-Cardiac:  hemodynamically stable currently not requiring pressor support; Echo on 03/23/2021 shows severely reduced EF of 20 to 25% compared to 55 to 60% preop.  Component of cardiogenic shock. ?-Pulmonary:  intubated on .40 FiO2; wean as tolerated per CCM ?-Neuro:  sedated; RN reports she will push away if attempting mouth care. ?-Renal:  creatinine continues to slowly rise at 1.21 today up from 1.14 and 1.07 the past couple of days.   ?-GI:  abdomen remains distended.  RN reports that an enema was tried but was not able to be retained.  Will try dulcolax supp today.  TF at 20cc/hr ?-Incisions:  all incisions healing-dry gauze placed in left groin due to being in groin crease to help wick moisture to help prevent wound infection ?-Heme/ID:  pt remains afebrile ?-labs tomorrow ?-appreciate CCM assistance with pt ? ? ?Leontine Locket, PA-C ?[facility] ?[phone] ?03/29/2022 ?6:53 AM ? ? ?  I have seen and evaluated the patient. I agree with the PA note as documented above.  Postop day 8 status post aortobifemoral bypass including bilateral femoral endarterectomies.  Postop course complicated by postoperative shock ultimately requiring intubation. ?  ?Neuro: Remains intubated.  Now following commands today and wiggling her toes - very encouraging. ?CV: Levophed weaned off Sunday - no pressor requirement.  Hemoglobin 9.9 yesterday, no repeat CBC today.  Echo on 03/23/2021 shows severely reduced EF of 20 to 25% compared to 55 to 60% preop.  Component of cardiogenic shock.   ?Pulmonary: Intubated.  Ventilator weaned to 40% FiO2 and PEEP of 5.  Failed SBT yesterday with low tidal volumes.   Will attempt SBT again today.  Appreciate critical care input.  Continued diuresis.   ?GI: Tube feeds 20 mL/hour at trickle given significant abdominal distention.  Abdomen slightly softer today but distended.  KUB shows gas into the colon yesterday and encouraging.   Getting bowel regimen with colace and miralax.  Also got enema yesterday.  Not toxic to warrant CT at this time. ?Renal: Slight AKI from aggressive diuresis and Cr 1.14 ---> 1.21 (baseline Cr ~0.8) , will monitor.   ?ID: Zosyn given concern for aspiration pneumonia. ?  ?Brisk PT signals bilaterally. ?  ?Remains critically ill but seems to show continued improvement. ? ?Marty Heck, MD ?[facility] ?Office: [phone]  ?

## 2022-03-29 NOTE — Progress Notes (Addendum)
? ?NAME:  Gabrielle [name], MRN:  [id number], DOB:  Jan 27, 1956, LOS: 8 ?ADMISSION DATE:  03/21/2022, CONSULTATION DATE:  03/22/22 ?REFERRING MD:  Unk Lightning, CHIEF COMPLAINT:  abd pain  ? ?History of Present Illness:  ?66 year old woman with hx of rectal cancer, DM2, HTN, HLD who presented for open aortobifemoral bypass by Dr. Unk Lightning for critical limb ischemia.  Postop course complicated by shock for which PCCM is consulted.  Patient underwent procedure with 14-7 bifurcated dacron graft, bilateral femoral endarterectomy late yesterday.  EBL 1.5L.  Currently c/o SOB and abdominal discomfort. ? ?Pertinent  Medical History  ?DM2- A1c 9.3% ?HTN ?HLD ?Severe PVD ? ?Significant Hospital Events: ?Including procedures, antibiotic start and stop dates in addition to other pertinent events   ?5/8  ?Aortobifemoral bypass using 14 x 7 bifurcated Dacron graft ?Bilateral femoral endarterectomy,  ?Left-sided profunda endarterectomy with bovine pericardial patch ?5/9 PCCM consult for hypotension ?5/10 worsening respiratory distress, intubated ?5/11 diuresis ?5/16 abdominal distention, increase bowel reg, attempts to wean from ventilator limited by low volumes ?  ?Interim History / Subjective:  ?Yesterday, had KUB which showed new gas in colon suggestive of improving ileus. No acute events overnight. This morning, more awake, able to follow commands. Still no bowel movement. ? ?Objective   ?Blood pressure (!) 158/82, pulse (!) 103, temperature 98.2 ?F (36.8 ?C), temperature source Oral, resp. rate (!) 28, height '4\' 9"'$  (1.448 m), weight 63.7 kg, SpO2 100 %. ?CVP:  [0 mmHg-18 mmHg] 8 mmHg  ?Vent Mode: PRVC ?FiO2 (%):  [40 %-50 %] 40 % ?Set Rate:  [20 bmp] 20 bmp ?Vt Set:  [300 mL] 300 mL ?PEEP:  [5 cmH20-8 cmH20] 5 cmH20 ?Pressure Support:  [15 cmH20] 15 cmH20 ?Plateau Pressure:  [20 KPT46-56 cmH20] 22 cmH20  ? ?Intake/Output Summary (Last 24 hours) at 03/29/2022 1024 ?Last data filed at 03/29/2022 0600 ?Gross per 24 hour  ?Intake 914.21 ml   ?Output 715 ml  ?Net 199.21 ml  ? ? ?Filed Weights  ? 03/27/22 0500 03/28/22 0500 03/29/22 0400  ?Weight: 60.8 kg 63.8 kg 63.7 kg  ? ? ?Examination: ?General appearance: 66 y.o., female, intubated ?HENT: NCAT; MMM ?Lungs: clear bl, equal chest rise ?CV: tachy, RR, no murmur  ?Abdomen: Distended, taut, hypoactive bowel sounds, surgical incision clean dry and intact ?Extremities: No peripheral edema, warm ?Skin: Normal turgor and texture; no rash ?Neuro: Follows commands to open eyes, wiggle toes ? ?Labs/imaging reviewed ?AXR with persistent ileus but progressing with air in the colon ?Cr 1.21 ? ?Resolved Hospital Problem list   ?Postoperative shock ?Troponinemia ? ?Assessment & Plan:  ?Acute respiratory failure  C/f aspiration pneumonia ?Patient intubated on 5/10 due to acute agitation, desaturation and tachycardia. Concern for possible aspiration. She has been satting well on PRVC, FiO2 of 40%, 5 of peep. Failed SBT today due to low tidal volumes. Inability to wean from ventilator is likely due to abdominal distention, pain, and mental status. Will aim to optimize these factors as below.  ?- daily SBTs  ?- s/p zosyn x 5 days, tracheal aspirate with no growth ? ?Abdominal distension  ?Last bowel movement 5/13. Enema yesterday unsuccessful. KUB suggests improving ileus. Abdomen still tense and distended. Will administer suppository today. ?- dulcolax suppository 10 mg  ?- continue miralax, colace ?- scheduled reglan ? ?Acute Kidney Injury ?Patient with rising creatinine, normal kidney function at baseline. Differential for AKI includes ATN or pre-renal etiology in the setting of shock vs potential developing abdominal compartment syndrome contributing to decreased  perfusion. Will hold further diuresis and hold off on fluids for now to preserve respiratory status. Given she continues to make adequate urine, less concern for need for fluids or further work up such as bladder scan. Will continue to monitor closely.  ?-  daily BMP ?- strict I/Os ? ?Acute metabolic encephalopathy ?Patient appears more awake and alert today. Mental status may be due to delirium vs anoxic injury in setting of shock. Will aim to wean sedation today to get better idea of mental status and improve ability to wean.  ?- wean precedex/fentanyl as able ? ?PAD s/p Aortobifemoral bypass  HTN, HLD hx  ?Vascular surgery following. Surgical site clear, dry and intact, perfusion to lower extremities stable.  ?- continue statin, ASA ? ?DM2 ?CBGs range from 164-235. Will increase basal insulin. ?- Increase semlgee to 15U daily ?- continue 6U aspart q4hrs, SSI ? ?Anemia of critical illness - improving ? ?Best Practice (right click and "Reselect all SmartList Selections" daily)  ? ?Diet/type: tubefeeds ?DVT prophylaxis: heparin ppx ?GI prophylaxis: N/A ?Lines: Central line ?Foley:  Yes, and it is still needed ?Code Status:  full code ?Last date of multidisciplinary goals of care discussion: Dr. Shearon Stalls updated husband at bedside 5/16 ? ?Excell Seltzer, MS4 ? ? ?Attending attestation: ?We are working on waking her up. Hold continuous sedation and continue prns for pain. Schedule reglan. Will check a Qtc. Suppository x 2 today. Failing SBT for low Vt likely related to abdominal distention and mental status.  ? ?The patient is critically ill due to respiratory failure.  Critical care was necessary to treat or prevent imminent or life-threatening deterioration.  Critical care was time spent personally by me on the following activities: development of treatment plan with patient and/or surrogate as well as nursing, discussions with consultants, evaluation of patient's response to treatment, examination of patient, obtaining history from patient or surrogate, ordering and performing treatments and interventions, ordering and review of laboratory studies, ordering and review of radiographic studies, pulse oximetry, re-evaluation of patient's condition and participation in  multidisciplinary rounds.  ? ?Critical Care Time devoted to patient care services described in this note is 45 minutes. This time reflects time of care of this [place] . This critical care time does not reflect separately billable procedures or procedure time, teaching time or supervisory time of PA/NP/Med student/Med Resident etc but could involve care discussion time.   ?   ? ?Spero Geralds ?[facility] ?03/29/2022 4:22 PM  ?Pager: see AMION ? ?If no response to pager , please call critical care on call (see AMION) until 7pm ?After 7:00 pm call Elink   ? ?

## 2022-03-30 ENCOUNTER — Inpatient Hospital Stay (HOSPITAL_COMMUNITY): Payer: BC Managed Care – PPO

## 2022-03-30 ENCOUNTER — Inpatient Hospital Stay: Payer: Self-pay

## 2022-03-30 ENCOUNTER — Encounter (HOSPITAL_COMMUNITY): Payer: Self-pay | Admitting: Vascular Surgery

## 2022-03-30 DIAGNOSIS — I739 Peripheral vascular disease, unspecified: Secondary | ICD-10-CM | POA: Diagnosis not present

## 2022-03-30 LAB — BASIC METABOLIC PANEL
Anion gap: 7 (ref 5–15)
BUN: 35 mg/dL — ABNORMAL HIGH (ref 8–23)
CO2: 26 mmol/L (ref 22–32)
Calcium: 9.1 mg/dL (ref 8.9–10.3)
Chloride: 111 mmol/L (ref 98–111)
Creatinine, Ser: 1.21 mg/dL — ABNORMAL HIGH (ref 0.44–1.00)
GFR, Estimated: 50 mL/min — ABNORMAL LOW (ref >60)
Glucose, Bld: 168 mg/dL — ABNORMAL HIGH (ref 70–99)
Potassium: 3.9 mmol/L (ref 3.5–5.1)
Sodium: 144 mmol/L (ref 135–145)

## 2022-03-30 LAB — CBC
HCT: 34.6 % — ABNORMAL LOW (ref 36.0–46.0)
Hemoglobin: 10.9 g/dL — ABNORMAL LOW (ref 12.0–15.0)
MCH: 29.5 pg (ref 26.0–34.0)
MCHC: 31.5 g/dL (ref 30.0–36.0)
MCV: 93.5 fL (ref 80.0–100.0)
Platelets: 342 10*3/uL (ref 150–400)
RBC: 3.7 MIL/uL — ABNORMAL LOW (ref 3.87–5.11)
RDW: 14.8 % (ref 11.5–15.5)
WBC: 15.8 10*3/uL — ABNORMAL HIGH (ref 4.0–10.5)
nRBC: 0 % (ref 0.0–0.2)

## 2022-03-30 LAB — CK TOTAL AND CKMB (NOT AT ARMC)
CK, MB: 1.3 ng/mL (ref 0.5–5.0)
Relative Index: 0.5 (ref 0.0–2.5)
Total CK: 255 U/L — ABNORMAL HIGH (ref 38–234)

## 2022-03-30 LAB — APTT: aPTT: 32 seconds (ref 24–36)

## 2022-03-30 LAB — GLUCOSE, CAPILLARY
Glucose-Capillary: 135 mg/dL — ABNORMAL HIGH (ref 70–99)
Glucose-Capillary: 141 mg/dL — ABNORMAL HIGH (ref 70–99)
Glucose-Capillary: 156 mg/dL — ABNORMAL HIGH (ref 70–99)
Glucose-Capillary: 176 mg/dL — ABNORMAL HIGH (ref 70–99)
Glucose-Capillary: 194 mg/dL — ABNORMAL HIGH (ref 70–99)
Glucose-Capillary: 85 mg/dL (ref 70–99)
Glucose-Capillary: 97 mg/dL (ref 70–99)

## 2022-03-30 LAB — BRAIN NATRIURETIC PEPTIDE: B Natriuretic Peptide: 559.7 pg/mL — ABNORMAL HIGH (ref 0.0–100.0)

## 2022-03-30 LAB — TRIGLYCERIDES: Triglycerides: 235 mg/dL — ABNORMAL HIGH (ref <150)

## 2022-03-30 LAB — TROPONIN I (HIGH SENSITIVITY)
Troponin I (High Sensitivity): 347 ng/L (ref <18)
Troponin I (High Sensitivity): 316 ng/L (ref <18)

## 2022-03-30 MED ORDER — DEXMEDETOMIDINE HCL IN NACL 400 MCG/100ML IV SOLN
0.4000 ug/kg/h | INTRAVENOUS | Status: DC
  Administered 2022-03-30: 0.4 ug/kg/h via INTRAVENOUS
  Administered 2022-03-30: 0.7 ug/kg/h via INTRAVENOUS
  Administered 2022-04-01: 0.4 ug/kg/h via INTRAVENOUS
  Filled 2022-03-30 (×5): qty 100

## 2022-03-30 MED ORDER — ASPIRIN 81 MG PO CHEW
81.0000 mg | CHEWABLE_TABLET | Freq: Once | ORAL | Status: AC
  Administered 2022-03-30: 81 mg
  Filled 2022-03-30: qty 1

## 2022-03-30 MED ORDER — SORBITOL 70 % SOLN
15.0000 mL | Freq: Once | Status: AC
  Administered 2022-03-30: 30 mL
  Filled 2022-03-30: qty 30

## 2022-03-30 MED ORDER — MEDIHONEY WOUND/BURN DRESSING EX PSTE
1.0000 "application " | PASTE | Freq: Every day | CUTANEOUS | Status: DC
  Administered 2022-03-30 – 2022-04-24 (×28): 1 via TOPICAL
  Filled 2022-03-30 (×6): qty 44

## 2022-03-30 MED ORDER — CLONAZEPAM 1 MG PO TABS
1.0000 mg | ORAL_TABLET | Freq: Once | ORAL | Status: AC
  Administered 2022-03-30: 1 mg
  Filled 2022-03-30: qty 1

## 2022-03-30 MED ORDER — BACITRACIN ZINC 500 UNIT/GM EX OINT
TOPICAL_OINTMENT | Freq: Three times a day (TID) | CUTANEOUS | Status: DC
Start: 2022-03-30 — End: 2022-05-01
  Administered 2022-03-30 – 2022-04-30 (×11): 1 via TOPICAL
  Filled 2022-03-30 (×3): qty 28.4

## 2022-03-30 MED ORDER — DOCUSATE SODIUM 50 MG/5ML PO LIQD
100.0000 mg | Freq: Once | ORAL | Status: AC
  Administered 2022-03-30: 100 mg
  Filled 2022-03-30: qty 10

## 2022-03-30 MED ORDER — SODIUM CHLORIDE 0.9% FLUSH
10.0000 mL | Freq: Two times a day (BID) | INTRAVENOUS | Status: DC
  Administered 2022-03-30 – 2022-04-05 (×11): 10 mL
  Administered 2022-04-05: 30 mL
  Administered 2022-04-06 – 2022-04-09 (×6): 10 mL
  Administered 2022-04-09: 20 mL
  Administered 2022-04-10: 40 mL
  Administered 2022-04-10 – 2022-04-13 (×6): 10 mL
  Administered 2022-04-13: 40 mL
  Administered 2022-04-14 (×2): 10 mL
  Administered 2022-04-15: 30 mL
  Administered 2022-04-15 – 2022-04-17 (×4): 10 mL
  Administered 2022-04-17: 20 mL
  Administered 2022-04-18 – 2022-04-19 (×3): 10 mL
  Administered 2022-04-20: 30 mL
  Administered 2022-04-20: 10 mL
  Administered 2022-04-21: 30 mL
  Administered 2022-04-21 – 2022-04-22 (×3): 10 mL
  Administered 2022-04-23: 20 mL
  Administered 2022-04-24: 10 mL
  Administered 2022-04-24: 20 mL
  Administered 2022-04-24 – 2022-04-25 (×2): 10 mL
  Administered 2022-04-25 – 2022-04-26 (×2): 30 mL
  Administered 2022-04-26 – 2022-04-27 (×2): 10 mL
  Administered 2022-04-27: 20 mL
  Administered 2022-04-28 – 2022-05-01 (×6): 10 mL

## 2022-03-30 MED ORDER — FUROSEMIDE 10 MG/ML IJ SOLN
40.0000 mg | Freq: Every day | INTRAMUSCULAR | Status: DC
  Administered 2022-03-30: 40 mg via INTRAVENOUS
  Filled 2022-03-30: qty 4

## 2022-03-30 MED ORDER — ROSUVASTATIN CALCIUM 20 MG PO TABS
40.0000 mg | ORAL_TABLET | Freq: Once | ORAL | Status: AC
  Administered 2022-03-30: 40 mg
  Filled 2022-03-30: qty 2

## 2022-03-30 NOTE — Progress Notes (Addendum)
? ?NAME:  Gabrielle [name], MRN:  [id number], DOB:  1956/07/19, LOS: 9 ?ADMISSION DATE:  03/21/2022, CONSULTATION DATE:  03/22/22 ?REFERRING MD:  Unk Lightning, CHIEF COMPLAINT:  abd pain  ? ?History of Present Illness:  ?66 year old woman with hx of rectal cancer, DM2, HTN, HLD who presented for open aortobifemoral bypass by Dr. Unk Lightning for critical limb ischemia.  Postop course complicated by shock for which PCCM is consulted.  Patient underwent procedure with 14-7 bifurcated dacron graft, bilateral femoral endarterectomy late yesterday.  EBL 1.5L.  Currently c/o SOB and abdominal discomfort. ? ?Pertinent  Medical History  ?DM2- A1c 9.3% ?HTN ?HLD ?Severe PVD ? ?Significant Hospital Events: ?Including procedures, antibiotic start and stop dates in addition to other pertinent events   ?5/8  ?Aortobifemoral bypass using 14 x 7 bifurcated Dacron graft ?Bilateral femoral endarterectomy,  ?Left-sided profunda endarterectomy with bovine pericardial patch ?5/9 PCCM consult for hypotension ?5/10 worsening respiratory distress, intubated ?5/11 diuresis ?5/16 abdominal distention, increase bowel reg, attempts to wean from ventilator limited by low volumes ?5/17 tachycardic, c/f STEMI, per cardiology no STEMI ?  ?Interim History / Subjective:  ?Overnight, patient was increasingly uncomfortable and agitated. Monitor picked up STE, so patient was evaluated for STEMI. Tachycardic to the 120s, no change in respiratory status or blood pressure. Bedside echo unchanged from prior. ?Troponin 347 -> 316, BNP 560, EKG with new ST elevations in lead III, depressions in I, v1. Per cardiology interventionist, no STEMI.  ?Precedex restarted for agitation. ?This morning, patient is sedated. Makes purposeful movements toward ETT. Has passed mucus but no bowel movement. ?Episode of bilious emesis this morning. ? ?Objective   ?Blood pressure 116/84, pulse (!) 105, temperature 97.9 ?F (36.6 ?C), temperature source Axillary, resp. rate (!) 24, height 4'  9" (1.448 m), weight 63.4 kg, SpO2 100 %. ?   ?Vent Mode: PRVC ?FiO2 (%):  [40 %] 40 % ?Set Rate:  [20 bmp] 20 bmp ?Vt Set:  [300 mL] 300 mL ?PEEP:  [5 cmH20-8 cmH20] 5 cmH20 ?Pressure Support:  [15 cmH20] 15 cmH20 ?Plateau Pressure:  [19 cmH20-26 cmH20] 24 cmH20  ? ?Intake/Output Summary (Last 24 hours) at 03/30/2022 0719 ?Last data filed at 03/30/2022 0000 ?Gross per 24 hour  ?Intake 413.39 ml  ?Output 1250 ml  ?Net -836.61 ml  ? ?Filed Weights  ? 03/28/22 0500 03/29/22 0400 03/30/22 0500  ?Weight: 63.8 kg 63.7 kg 63.4 kg  ? ? ?Examination: ?General appearance: 65 y.o., female, intubated and sedated ?HENT: NCAT; MMM ?Lungs: coarse bl, equal chest rise ?CV: RRR, no murmur  ?Abdomen: Distended, taut, bowel sounds present ?Extremities: No peripheral edema, warm ?Skin: Normal turgor and texture; no rash ?Neuro: Unresponsive to voice ? ?Labs/imaging reviewed ?Creatinine stable (1.21) ?WBC 10.9 -> 15.8 ?Trop 347 -> 316 ?BNP 560 ?Abd XR with similar gas pattern to prior, recommend CT given prolonged course ? ?Resolved Hospital Problem list   ?Postoperative shock ? ?Assessment & Plan:  ?Acute respiratory failure  C/f aspiration pneumonia ?Patient intubated on 5/10 due to acute agitation, desaturation and tachycardia. Concern for possible aspiration. Overnight had concern for STEMI with tachycardia but no change in respiratory status. Tachycardia may have been due to increased agitation vs potential PE, which is less likely given no change in O2 needs, lack of unilateral leg swelling, and prophylactic anticoagulation. Will reattempt SBT today. Inability to wean from ventilator is likely due to abdominal distention, pain, and mental status. Given elevated BNP, may have volume on board as well.   ?-  daily SBTs  ?- IV lasix 40 mg  ?- s/p zosyn x 5 days, tracheal aspirate with no growth ? ?Abdominal distension  ?Last bowel movement 5/13. Overnight patient passed mucus but no stool. Abdomen still tense and distended. This  morning had episode of emesis. Given slow resolution of ileus and moderate concern for bowel obstruction, will stop tube feeds and obtain CT a/p. In the event of mechanical obstruction, would hold tube feeds and reglan. ?- CT A/P to rule out bowel obstruction ?- mannitol per vascular surgery ?- continue miralax, colace ?- hold reglan if mechanical obstruction, otherwise can continue ?- zofran for nausea ? ?Elevated Troponin  New Reduced EF ?Patient had acute event overnight with tachycardia, EKG changes and elevated troponin. Echo from 5/10 showed EF of 20%, reduced from normal echo on 5/9. At that time she had troponin elevation which peaked at 5000 but no EKG changes. Will consult cardiology today for input regarding new reduced EF and potential rate-dependent ST changes on EKG. ?- cardiology consult, appreciate assistance ?- IV lasix as above ?- monitor on telemetry ? ?Acute Kidney Injury ?Patient with elevated creatinine, normal kidney function at baseline. Creatinine stable from yesterday. Differential for AKI includes ATN or pre-renal etiology in the setting of shock vs potential developing abdominal compartment syndrome contributing to decreased perfusion. Given elevated BNP, likely will benefit from diuresis from a cardiopulmonary standpoint. Will administer lasix and continue to monitor kidneys. ?- IV lasix 40 mg, monitor for response  ?- daily BMP ?- strict I/Os ? ?Acute metabolic encephalopathy ?Had to restart precedex overnight due to agitation. Patient increasingly sedated this morning.  ?- wean precedex/fentanyl as able ? ?PAD s/p Aortobifemoral bypass  HTN, HLD hx  ?Vascular surgery following. Surgical site clear, dry and intact, perfusion to lower extremities stable.  ?- continue statin, ASA ? ?DM2 ?CBGs range from 176-205.  ?- continue semlgee to 15U daily ?- continue 6U aspart q4hrs, SSI ? ?Anemia of critical illness - improving ? ?Best Practice (right click and "Reselect all SmartList  Selections" daily)  ? ?Diet/type: NPO ?DVT prophylaxis: heparin ppx ?GI prophylaxis: N/A ?Lines: Central line ?Foley:  Yes, and it is still needed ?Code Status:  full code ?Last date of multidisciplinary goals of care discussion: Dr. Shearon Stalls updated husband at bedside 5/16 ? ?Excell Seltzer, MS4 ? ? ?Attending Attestation: ? ?She had large volume emesis this morning followed by a bowel movement finally. Initally I had plans to get a CT A/P to evaluate for mechanical obstruction but this is now less likely given she had a BM. However last night she had some rate related ST segment changes that were concerning and has an echo that shows EF 20%. I have consulted cardiology who feels like likely needs left heart catheterization if something like this happens again. In the setting of her PAD and grossly abnormal echo there is concern for multi-vessel disease. We will lighten her sedation again and see if this happens. Will also start TPN for nutrition she is at risk for malnutrition in the setting of post-operative ileus. This is limiting ventilator liberation. Will continue to follow closely.  ? ? ?The patient is critically ill due to respiratory failure.  Critical care was necessary to treat or prevent imminent or life-threatening deterioration.  Critical care was time spent personally by me on the following activities: development of treatment plan with patient and/or surrogate as well as nursing, discussions with consultants, evaluation of patient's response to treatment, examination of patient, obtaining history from  patient or surrogate, ordering and performing treatments and interventions, ordering and review of laboratory studies, ordering and review of radiographic studies, pulse oximetry, re-evaluation of patient's condition and participation in multidisciplinary rounds.  ? ?Critical Care Time devoted to patient care services described in this note is 55 minutes. This time reflects time of care of this [place] . This critical care time does not reflect separately billable procedures or procedure time, teaching time or supervisory time of PA/NP/Med student/Med Resident etc but could involve care discussion time.   ?   ?

## 2022-03-30 NOTE — Progress Notes (Signed)
Peripherally Inserted Central Catheter Placement ? ?The IV Nurse has discussed with the patient and/or persons authorized to consent for the patient, the purpose of this procedure and the potential benefits and risks involved with this procedure.  The benefits include less needle sticks, lab draws from the catheter, and the patient may be discharged home with the catheter. Risks include, but not limited to, infection, bleeding, blood clot (thrombus formation), and puncture of an artery; nerve damage and irregular heartbeat and possibility to perform a PICC exchange if needed/ordered by physician.  Alternatives to this procedure were also discussed.  Bard Power PICC patient education guide, fact sheet on infection prevention and patient information card has been provided to patient /or left at bedside.   ? ?PICC Placement Documentation  ?PICC Triple Lumen 03/30/22 Right Brachial 33 cm 0 cm (Active)  ?Indication for Insertion or Continuance of Line Administration of hyperosmolar/irritating solutions (i.e. TPN, Vancomycin, etc.) 03/30/22 1727  ?Exposed Catheter (cm) 0 cm 03/30/22 1727  ?Site Assessment Clean, Dry, Intact 03/30/22 1727  ?Lumen #1 Status Flushed;Blood return noted;Saline locked 03/30/22 1727  ?Lumen #2 Status Flushed;Blood return noted;Saline locked 03/30/22 1727  ?Lumen #3 Status Flushed;Blood return noted;Saline locked 03/30/22 1727  ?Dressing Type Transparent 03/30/22 1727  ?Dressing Status Antimicrobial disc in place 03/30/22 1727  ?Dressing Change Due 04/06/22 03/30/22 1727  ? ? ? ? ? ?Gabrielle [name] ?03/30/2022, 5:31 PM ? ?

## 2022-03-30 NOTE — Progress Notes (Signed)
?  03/30/22 1420  ?Clinical Encounter Type  ?Visited With Family ?(Mother-in-law)  ?Visit Type Initial  ?Referral From Chaplain ?(Met in family waiting area)  ?Consult/Referral To Chaplain ?( )  ?Spiritual Encounters  ?Spiritual Needs Emotional  ? ?Met with Ms. Stevison's mother-in-law sitting in 2H waiting area.  ?Chaplain  , M. Min., [phone].   ?

## 2022-03-30 NOTE — Progress Notes (Signed)
I reviewed her KUB from earlier today after I finished in the OR and I think her bowel distension is worse.  I went to check on her progress in the ICU and she had emesis earlier today.  Nursing had already placed her NG tube to suction with 1600 mL of output.  She subsequently had a bowel movement as well that was nonbloody.  I think this is all an ileus picture from her critical illness and I do not think she has any evidence of mechanical obstruction given she has continued to have bowel function.  I think we should stop tube feeds at this time and put her NG to suction.  I discussed with critical care getting a PICC line and starting her on TPN for nutrition.  I have updated the family at bedside. ? ?Marty Heck, MD ?[facility] ?Office: [phone] ? ? ?Marty Heck ? ?

## 2022-03-30 NOTE — Progress Notes (Signed)
Patient's monitor started ringing out STE. Notified primary RN to obtain EKG. See results. Conferred with Elink who contacted ground team, now at bedside. Labs ordered. Instructed to notify cardiology.  ? ?Dr. Alfred Levins reviewed EKG, discussed with internationalist, and requested an additional EKG. Per MD, no STEMI.  ? ?Updated CCM at bedside.  ?

## 2022-03-30 NOTE — Progress Notes (Addendum)
?Aortic Surgery Progress Note ? ? ? ?03/30/2022 ?6:50 AM ?9 Days Post-Op ? ?Subjective:  intubated ? ?Afebrile ?HR 100's-120's overnight ?440'H-474'Q systolic ?595% .63OVF6 ? ?Gtts:   ?Dexmedetomidine ? ?Vitals:  ? 03/30/22 0400 03/30/22 0500  ?BP: 134/84 116/84  ?Pulse: (!) 117 (!) 105  ?Resp: (!) 29 (!) 24  ?Temp: 97.9 ?F (36.6 ?C)   ?SpO2: 98% 100%  ? ? ?Physical Exam: ?Cardiac:  regular ?Lungs:  intubated on .43PIR5 ?Abdomen:  continues to be distended.  No bowel sounds heard.  ?Incisions:  all incisions continue to heal ?Extremities:  brisk bilateral PT doppler signals  ?General:  no distress ? ?CBC ?   ?Component Value Date/Time  ? WBC 15.8 (H) 03/30/2022 1884  ? RBC 3.70 (L) 03/30/2022 0307  ? HGB 10.9 (L) 03/30/2022 0307  ? HGB 14.5 03/22/2021 0916  ? HCT 34.6 (L) 03/30/2022 0307  ? HCT 43.9 03/22/2021 0916  ? PLT 342 03/30/2022 0307  ? PLT 261 03/22/2021 0916  ? MCV 93.5 03/30/2022 0307  ? MCV 90 03/22/2021 0916  ? MCH 29.5 03/30/2022 0307  ? MCHC 31.5 03/30/2022 0307  ? RDW 14.8 03/30/2022 0307  ? RDW 12.5 03/22/2021 0916  ? LYMPHSABS 2.8 02/03/2015 0527  ? MONOABS 0.4 02/03/2015 0527  ? EOSABS 0.1 02/03/2015 0527  ? BASOSABS 0.0 02/03/2015 0527  ? ? ?BMET ?   ?Component Value Date/Time  ? NA 144 03/30/2022 0307  ? NA 137 03/22/2021 0916  ? K 3.9 03/30/2022 0307  ? CL 111 03/30/2022 0307  ? CO2 26 03/30/2022 0307  ? GLUCOSE 168 (H) 03/30/2022 1660  ? BUN 35 (H) 03/30/2022 6301  ? BUN 11 03/22/2021 0916  ? CREATININE 1.21 (H) 03/30/2022 6010  ? CREATININE 0.74 11/11/2015 1921  ? CALCIUM 9.1 03/30/2022 0307  ? GFRNONAA 50 (L) 03/30/2022 9323  ? [facility] >89 11/11/2015 1921  ? GFRAA 78 10/23/2020 1701  ? GFRAA >89 11/11/2015 1921  ? ? ?INR ?   ?Component Value Date/Time  ? INR 1.1 03/27/2022 0510  ? ? ? ?Intake/Output Summary (Last 24 hours) at 03/30/2022 0650 ?Last data filed at 03/30/2022 0000 ?Gross per 24 hour  ?Intake 413.39 ml  ?Output 1250 ml  ?Net -836.61 ml  ? ? ? ?Assessment/Plan:  66 y.o. female is  s/p  ?Aortobifemoral bypass using 14 x 7 bifurcated Dacron graft ?Bilateral femoral endarterectomy,  ?Left-sided profunda endarterectomy with bovine pericardial patch ?9 Days Post-Op ? ?-Vascular:  continues to have brisk bilateral PT doppler signals.  ?-Cardiac:  not requiring pressor support.  EKG overnight suggest STEMI and tachycardic.  She had elevated troponins.  Cardiology contacted and repeat EKG deemed no STEMI.  Echo on 03/23/2021 shows severely reduced EF of 20 to 25% compared to 55 to 60% preop.  Component of cardiogenic shock.   ?-Pulmonary:  remains intubated.   ?-Neuro:  currently sedated.  RN turning down sedation now to evaluate ?-Renal:  BUN/creatinine stable today at 35/1.21  ?-GI:  abdomen remains distended-no bowel sounds heard despite supp x 2 yesterday.  KUB a couple of days ago felt resolving ileus given progression of gas in right colon.   ?-Incisions:  all incisions continue to heal nicely. Continue dry gauze in bilateral groins to wick moisture to help prevent wound infection ?-Heme/ID:  leukocytosis worsening but pt remains afebrile.  She is on zosyn currently for aspiration pna.  May need to check u/a.  Acute blood loss anemia is improved today and hgb is up to  10.9.   ?-General:  no distress.  ? ? ?Leontine Locket, PA-C ?[facility] ?[phone] ?03/30/2022 ?6:50 AM ? ? ?I have seen and evaluated the patient. I agree with the PA note as documented above. Postop day 9 status post aortobifemoral bypass including bilateral femoral endarterectomies.  Postop course complicated by postoperative shock ultimately requiring intubation. ?  ?Neuro: Remains intubated.  Still following commands today and wiggling her toes and opening eyes. ?CV: Levophed weaned off since Sunday - no pressor requirement.  Hemoglobin 10.9 - no indication for transfusion.  Echo on 03/23/2021 shows severely reduced EF of 20 to 25% compared to 55 to 60% preop.  Component of cardiogenic shock.  Question of  ST changes on EKG last night - cardiology called and did not feel it was STEMI after repeat EKG.  BNP 559. ?Pulmonary: Intubated.  Ventilator weaned to 40% FiO2 and PEEP of 5.  Failed SBT yesterday and again today with low tidal volumes.  Appreciate critical care input. May ultimately require trach. ?GI: Tube feeds 20 mL/hour at trickle given significant abdominal distention.  Will repeat KUB today given ongoing distension.  Try sorbitol for bowel regimen. ?Renal: Slight AKI from aggressive diuresis and Cr 1.21 ---> 1.21 and stable since yesterday (baseline Cr ~0.8) - continue to monitor.   ?ID: Zosyn given concern for aspiration pneumonia.  Increase in WBC now 15.8 - remains afebrile.  Will closely monitor.   ?  ?10 cm x 13 cm sacral pressure wound developing - will consult wound care for further input. ? ?Brisk PT signals bilaterally. ?  ?Remains critically ill. ?  ?Marty Heck, MD ?[facility] ?Office: [phone]  ? ?Marty Heck ? ? ?

## 2022-03-30 NOTE — Progress Notes (Signed)
Nutrition Follow-up ? ?DOCUMENTATION CODES:  ? ?Severe malnutrition in context of acute illness/injury ? ?INTERVENTION:  ? ?Recommend initiation of TPN given intolerance to TF and inadequate nutrition >/= 9 days with newly documented unstageable PI to sacrum, acute malnutrition.  Discussed with PCCM this AM and later received ok to initiate TPN. TPN order placed post 12pm cutoff and likely will not start until tomorrow.  ?-Nutrition needs adjusted today given new unstageable PI ? ?Once TPN initiated, monitor magnesium, potassium, and phosphorus BID for at least 3 days, MD to replete as needed, as pt is at risk for refeeding syndrome given prolonged inadequate nutrition, malnutrition. ? ?Hold TF for now given +emesis with >/= 1500 mL out of NG this AM, abd remains distended, persistent distention on abd xray ? ?NUTRITION DIAGNOSIS:  ? ?Severe Malnutrition related to acute illness as evidenced by mild fat depletion, moderate muscle depletion, energy intake < or equal to 50% for > or equal to 5 days. ? ?Continues but being addressed by plan to start TPN ? ?GOAL:  ? ?Patient will meet greater than or equal to 90% of their needs ? ?Not Met ? ?MONITOR:  ? ?Vent status, Weight trends, Labs, TF tolerance ? ?REASON FOR ASSESSMENT:  ? ?Consult, Ventilator ?Enteral/tube feeding initiation and management ? ?ASSESSMENT:  ? ?66 yo female admitted for aortobifemoral bypass due to right-sided Rutherford 4 critical limb ischemia, left-sided Rutherford 3 critical limb ischemia.  Pt developed postop shock and respiratory failure requiring intubation.  PMH includes DM, HTN, HLD, severe PAD ? ?5/08 Aortobifemoral bypass using 14 x 7 bifurcated Dacron graft, Bilateral femoral endarterectomy,  Left-sided profunda endarterectomy with bovine pericardial patch ?5/09 PCCM consult for hypotension ?5/10 Worsening respiratory status requiring intubation ?5/11 Trickle TF initiated ?5/12 Initiated TF titration ?5/13 TF decreased back to trickle TF  given abd distention: abd xray with dilated SB ?5/17 TF continued at trickle TF rate until pt experienced large volume emesis with additional NG suction 1.5 L out ? ?Pt remains on vent support ? ?Pt with large volume emesis this AM; NG placed to LIS with >/= 1.5 L out of dark yellowish, browinsh output ? ?+large liquid BM after emesis per RN; despite having a BM, abdomen remains distended/taught ? ?Abd xray this AM with persistent gaseous distention of small intestine and proximal colon.  ? ?After discussion regarding nutrition poc with CCM; PCCM desiring attempt at post pyloric Cortrak. Cortrak team attempted post-pyloric feeding tube placement but unsuccessful. Cortrak tube tip is gastric. ? ?TF on hold. Pt continues with inadequate nutrition >/= 9 days.  ? ?RD recommends initiation of TPN at this time ? ?[place] RN consulted today secondary to new unstageable wound to sacrococcygeal area, 7 cm x 13 cm. RD able to observe wound on assessment today; noted most of wound intact skin but small portion opened and noted stool on wound, RN cleaning pt at time of visit.  ? ?NUTRITION - FOCUSED PHYSICAL EXAM: ? ?Flowsheet Row Most Recent Value  ?Orbital Region Moderate depletion  ?Upper Arm Region Mild depletion  ?Buccal Region Mild depletion  ?Temple Region Moderate depletion  ?Clavicle Bone Region Mild depletion  ?Clavicle and Acromion Bone Region Moderate depletion  ?Scapular Bone Region Mild depletion  ?Dorsal Hand Unable to assess  ?Patellar Region Moderate depletion  ?Anterior Thigh Region Moderate depletion  ?Posterior Calf Region Moderate depletion  ?Edema (RD Assessment) Mild  ? ?  ? ? ?Diet Order:   ?Diet Order   ? ?       ?  Diet NPO time specified  Diet effective now       ?  ? ?  ?  ? ?  ? ? ?EDUCATION NEEDS:  ? ?Not appropriate for education at this time ? ?Skin:  Skin Assessment: Skin Integrity Issues: ?Skin Integrity Issues:: DTI ?DTI: perineum ? ?Last BM:  5/17 +large BM post large volume emesis ? ?Height:   ? ?Ht Readings from Last 1 Encounters:  ?03/24/22 4' 9"  (1.448 m)  ? ? ?Weight:  ? ?Wt Readings from Last 1 Encounters:  ?03/30/22 63.4 kg  ? ? ?BMI:  Body mass index is 30.25 kg/m?. ? ?Estimated Nutritional Needs:  ? ?Kcal:  1750-1950 kcals ? ?Protein:  95-110 g ? ?Fluid:  >/= 1.7 L ? ?Kerman Passey MS, RDN, LDN, CNSC ?Registered Dietitian III ?Clinical Nutrition ?RD Pager and On-Call Pager Number Located in [place]  ? ?

## 2022-03-30 NOTE — Progress Notes (Addendum)
PCCM Interval Progress Note ? ?Ground team contacted by E-Link to evaluate patient for possible STEMI. ? ?On arrival, evaluated printed EKG with c/f STEMI. Assessed patient at bedside; she is intubated but wakes to voice, making purposeful movements toward ETT with mitts in place. Tachycardic with rate 120s, no murmur or gallop appreciated. Lung fields with coarse breath sounds c/w mechanical ventilation. Brief limited bedside Echo completed and grossly unchanged from prior. ? ?Troponin, CKMB, BNP sent. Cardiology contacted and requested repeat EKG/discussed with interventionalist, who deemed no STEMI. ? ?Will f/u cardiac enzymes. Notified bedside RN/charge RN, PCCM will remain available as needed. ? ?Lestine Mount, PA-C ?[facility] ?03/30/22 2:06 AM ? ?Please see [email or web address] for pager details. ? ?From 7A-7P if no response, please call [phone] ?After hours, please call ELink [phone] ?

## 2022-03-30 NOTE — Procedures (Signed)
Cortrak ? ?Tube Type:  Cortrak - 43 inches ?Tube Location:  Right nare ?Initial Placement:  Stomach ?Secured by: Dorann Lodge ?Technique Used to Measure Tube Placement:  Marking at nare/corner of mouth ?Cortrak Secured At:  75 cm ? ?Cortrak Tube Team Note: ? ?Consult received to place a Cortrak feeding tube.  ? ?X-ray is required, abdominal x-ray has been ordered by the Cortrak team. Please confirm tube placement before using the Cortrak tube.  ? ?If the tube becomes dislodged please keep the tube and contact the Cortrak team at [email or web address] (password [email or web address]) for replacement.  ?If after hours and replacement cannot be delayed, place a NG tube and confirm placement with an abdominal x-ray.  ? ?RD unable to advance tube post-pyloric after multiple attempts. Recommend fluoroscopy guided advancement of tube.  ? ?Koleen Distance MS, RD, LDN ?Please refer to AMION for RD and/or RD on-call/weekend/after hours pager ? ?

## 2022-03-30 NOTE — Consult Note (Signed)
WOC Nurse Consult Note: ?Reason for Consult: Unstageable pressure injury to sacrococcygeal area. Peeling epithelium with 70% maroon, intact discoloration ?Has large bowel movement present at this time.   ?Wound type: Unstageable pressure injury ?Pressure Injury POA: No ?Measurement: 7 cm x 13 cm unable to visualize deepest part of wound due to intact maroon discoloration ?Wound bed:red, peeling epithelium to 30% wound.   ?Drainage (amount, consistency, odor) minimal serosanguinous  no odor  ?Periwound: intact  ?Foley in place  perineal skin is intact.  Bedside RN present as well. Patient is cleansed of stool. Foley care provided.  ?Dressing procedure/placement/frequency: Cleanse sacral wound with NS and pat dry in a nickel thick layer , but not in contact with the peri-wound skin. Cover with silicone foam.  Change dressing daily.  ?We will follow and assess every 7-10 days.  ?Domenic Moras MSN, RN, FNP-BC CWON ?Wound, Ostomy, Continence Nurse ?Pager [phone]  ? ?  ?

## 2022-03-30 NOTE — Consult Note (Signed)
? ?Cardiology Consultation:  ? ?Patient ID: APREL EGELHOFF; [id number]; 11-02-1956  ? ?Admit date: 03/21/2022 ?Date of Consult: 03/30/2022 ? ?Primary Care Provider: Dorna Mai, MD ?Primary Cardiologist: Janina Mayo, MD  ?Primary Electrophysiologist:  None ? ? ?Patient Profile:  ? ?Gabrielle [name] is a 66 y.o. female with a hx of PVD who is being seen today for the evaluation of acute systolic HF at the request of Dr. Shearon Stalls. ? ?History of Present Illness:  ? ?Ms. Gabrielle [name] has a history of peripheral vascular disease and was admitted with critical limb ischemia requiring right aortobifem bypass.  She had aortobifem with a 14 x 7 bifurcated Dacron graft and bilateral femoral endarterectomy.  Left-sided profunda endarterectomy with bovine pericardial patch.  he does not have a history of coronary disease but has significant cardiovascular risk factors with diabetes, hyperlipidemia and hypertension.  She was seen preoperatively.  But have no high risk features or findings.   ? ?Postop the patient had sinus tachycardia.  She had acute blood loss anemia requiring transfusion.  There was some thrombocytopenia. She was hypotensive and required some low-dose Levophed.  She did have some hypoxemia requiring high flow oxygen and nonrebreather.  She subsequently required intubation.  She has been managed with diuresis and covered with antibiotics for possible aspiration.  She has had acute kidney injury.  This necessitated reducing diuresis.  She did have an echocardiogram on 5 9 the day after surgery and was found to have ejection fraction of 55 to 60% with suboptimal imaging.  There are some mild aortic stenosis.  Repeat angiography on 5/10 demonstrated a newly reduced ejection fraction 20 to 25% with severe global hypokinesis.  Right ventricular size was normal.  Of note yesterday she was more restless on the ventilator.  Bedside echo did not suggest change from the previous per the notes last night. ? ?Of note she has a  baseline right bundle branch block.  510 EKG did not suggest acute ST segment changes other than some nonspecific depression diffusely with sinus tachycardia.  QT was prolonged.  EKG today demonstrated lateral ST elevation with continued QT prolongation a bundle branch block.  Of note enzymes were elevated on 5/10 2539 peaking at 5007 but trending down over the course of these last few days. ? ?The patient is intubated and sedated.  However, from her recent cardiology office note there was no report of chest pain or SOB prior to admission.  She was active and did have some limiting claudication.  No prior cardiac work up.   ? ?Past Medical History:  ?Diagnosis Date  ? Cancer [facility]) 2010  ? rectal CA  ? Cataract   ? Diabetes mellitus without complication ([place])   ? Phreesia 11/15/2020  ? Eczema   ? Hyperlipidemia   ? Hypertension   ? Intrinsic (urethral) sphincter deficiency (ISD)   ? RBBB   ? SUI (stress urinary incontinence, female)   ? hx of  ? Type 2 diabetes mellitus ([place])   ? Wears dentures   ? UPPER  ? Wears glasses   ? ? ?Past Surgical History:  ?Procedure Laterality Date  ? ABDOMINAL HYSTERECTOMY  1998  ? fibroids, partial  ? ABDOMINOPLASTY  1999  ? ANGIOPLASTY Left 03/21/2022  ? Procedure: ANGIOPLASTY USING XENOSURE BIOLOGLIC PATCH (2NOI3BC);  Surgeon: Broadus John, MD;  Location: [facility] OR;  Service: Vascular;  Laterality: Left;  ? AORTA - BILATERAL FEMORAL ARTERY BYPASS GRAFT N/A 03/21/2022  ? Procedure: AORTOBIFEMORAL BYPASS GRAFT USING  HEMASHIELD GOLD GRAFT (14x57m);  Surgeon: RBroadus John MD;  [place]  Service: Vascular;  Laterality: N/A;  ? BLADDER SUSPENSION N/A 03/06/2014  ? [facility] ;  Surgeon: Gabrielle Seal MD;  [facility]  Service: [facility];  Laterality: N/A;  ? CARDIOVASCULAR STRESS TEST  03-27-2008  ? NORMAL PERFUSION STUDY/  EF 54%  ? COLON SURGERY  2010  ? for rectal ca  ? COLONOSCOPY    ? ENDARTERECTOMY FEMORAL Bilateral 03/21/2022  ? Procedure:  ENDARTERECTOMY FEMORAL BILATERAL;  Surgeon: RBroadus John MD;  [place] [facility] [facility]  Service: Vascular;  Laterality: Bilateral;  ? EUS N/A 03/02/2017  ? Procedure: LOWER ENDOSCOPIC ULTRASOUND (EUS);  Surgeon: DMilus Banister MD;  [facility];  Service: Endoscopy;  Laterality: N/A;  ? EXCISION LOWER ABDOMINAL SCAR POST ABDOMINOPLASTY  01-05-2000  ? ORIF RIGHT ANKLE FX  12-24-2008  ? RIGHT ANKLE ARTHROSCOPY W/ DEBRIDEMENT AND OPEN REMOVAL HARDWARE  03-12-2010  ? TRANSTHORACIC ECHOCARDIOGRAM  08-05-2009  ? MILD LVH/  EF 60-65%  ? TUBAL LIGATION    ?  ? ?Home Medications:  ?Prior to Admission medications   ?Medication Sig Start Date End Date Taking? Authorizing Provider  ?amLODipine (NORVASC) 10 MG tablet Take 1 tablet (10 mg total) by mouth daily. 11/26/21  Yes WDorna Mai MD  ?aspirin 81 MG tablet Take 81 mg by mouth daily.   Yes [provider]  ?atorvastatin (LIPITOR) 40 MG tablet Take 1 tablet (40 mg total) by mouth every morning. 11/26/21  Yes WDorna Mai MD  ?dapagliflozin propanediol (FARXIGA) 10 MG TABS tablet Take 1 tablet (10 mg total) by mouth daily. 11/26/21  Yes WDorna Mai MD  ?lisinopril (ZESTRIL) 10 MG tablet TAKE 1 TABLET BY MOUTH ONCE DAILY IN THE MORNING 11/26/21  Yes WDorna Mai MD  ?metFORMIN (GLUCOPHAGE) 1000 MG tablet TAKE 1 TABLET BY MOUTH TWICE DAILY WITH A MEAL 11/26/21  Yes WDorna Mai MD  ?Semaglutide (RYBELSUS) 14 MG TABS Take 14 mg by mouth daily. 11/26/21  Yes WDorna Mai MD  ?sitaGLIPtin (JANUVIA) 25 MG tablet Take 25 mg by mouth daily.   Yes [provider]  ?gabapentin (NEURONTIN) 300 MG capsule Take 1 capsule (300 mg total) by mouth at bedtime. ?Patient taking differently: Take 300 mg by mouth at bedtime as needed (pain). 11/26/21   WDorna Mai MD  ?glucose blood (ACCU-CHEK SMARTVIEW) test strip PATIENT NEEDS OFFICE VISIT FOR ADDITIONAL REFILLS - very overdue for DM check up. 11/11/15   EJoretta Bachelor PA  ? ? ?Inpatient  Medications: ?Scheduled Meds: ? artificial tears  1 application. Both Eyes Q8H  ? aspirin  81 mg Per Tube Daily  ? chlorhexidine gluconate (MEDLINE KIT)  15 mL Mouth Rinse BID  ? Chlorhexidine Gluconate Cloth  6 each Topical Daily  ? clonazePAM  1 mg Per Tube BID  ? docusate  100 mg Per Tube BID  ? feeding supplement (PROSource TF)  45 mL Per Tube QID  ? furosemide  40 mg Intravenous Daily  ? heparin injection (subcutaneous)  5,000 Units Subcutaneous Q8H  ? insulin aspart  0-15 Units Subcutaneous Q4H  ? insulin aspart  6 Units Subcutaneous Q4H  ? insulin glargine-yfgn  15 Units Subcutaneous Daily  ? leptospermum manuka honey  1 application. Topical Daily  ? mouth rinse  15 mL Mouth Rinse 10 times per day  ? metoCLOPramide (REGLAN) injection  10 mg Intravenous Q6H  ? pantoprazole (PROTONIX) IV  40 mg Intravenous  QHS  ? polyethylene glycol  17 g Per Tube Daily  ? rosuvastatin  40 mg Per Tube Daily  ? sodium chloride flush  10-40 mL Intracatheter Q12H  ? ?Continuous Infusions: ? sodium chloride    ? sodium chloride 10 mL/hr at 03/30/22 1200  ? dexmedetomidine (PRECEDEX) IV infusion 0.841 mcg/kg/hr (03/30/22 1233)  ? feeding supplement (VITAL AF 1.2 CAL) 1,000 mL (03/29/22 1500)  ? magnesium sulfate bolus IVPB    ? ?PRN Meds: ?Place/Maintain arterial line **AND** sodium chloride, sodium chloride, acetaminophen (TYLENOL) oral liquid 160 mg/5 mL, bisacodyl, fentaNYL (SUBLIMAZE) injection, fentaNYL (SUBLIMAZE) injection, guaiFENesin-dextromethorphan, hydrALAZINE, labetalol, lip balm, magnesium sulfate bolus IVPB, ondansetron, phenol, sodium chloride flush ? ?Allergies:   No Known Allergies ? ?Social History:   ?Social History  ? ?Tobacco Use  ? Smoking status: Former  ?  Years: 20.00  ?  Types: Cigarettes  ?  Quit date: 11/14/2006  ?  Years since quitting: 15.3  ? Smokeless tobacco: Never  ?Vaping Use  ? Vaping Use: Never used  ?Substance Use Topics  ? Alcohol use: No  ? Drug use: No  ?  ? ?Family History:   ? ?Family  History  ?Problem Relation Age of Onset  ? Diabetes Mother   ? Hypertension Mother   ? Diabetes Sister   ? Hypertension Sister   ? Diabetes Brother   ? Hypertension Brother   ? Lupus Daughter   ? Colon cancer Neg Hx

## 2022-03-30 NOTE — TOC Initial Note (Signed)
Transition of Care (TOC) - Initial/Assessment Note  ? ? ?Patient Details  ?Name: Gabrielle [name] ?MRN: [id number] ?Date of Birth: 1956/08/24 ? ?Transition of Care (TOC) CM/SW Contact:    ?Graves-Bigelow, Ocie Cornfield, RN ?Phone Number: ?03/30/2022, 4:13 PM ? ?Clinical Narrative: Patient presented for critical limb ischemia. PTA patient was independent from home. Case Manager continues to follow for disposition needs as the patient progresses-currently intubated. Patient will benefit from PT/OT consult when stable to provide disposition recommendations.            ? ?Expected Discharge Plan: [place] ?Barriers to Discharge: Continued Medical Work up ? ? ?Expected Discharge Plan and Services ?Expected Discharge Plan: [place] ?  ?Discharge Planning Services: CM Consult ?  ?Living arrangements for the past 2 months: [place] ?                ?  ?  ? ?Prior Living Arrangements/Services ?Living arrangements for the past 2 months: [place] ?Lives with:: Spouse ?Patient language and need for interpreter reviewed:: Yes ?       ?Need for Family Participation in Patient Care: Yes (Comment) ?Care giver support system in place?: Yes (comment) ?  ?Criminal Activity/Legal Involvement Pertinent to Current Situation/Hospitalization: No - Comment as needed ? ?  ?  ?Alcohol / Substance Use: Not Applicable ?Psych Involvement: No (comment) ? ?Admission diagnosis:  PAD (peripheral artery disease) ([place]) [I73.9] ?Aortoiliac occlusive disease ([place]) [I74.09] ?Patient Active Problem List  ? Diagnosis Date Noted  ? PAD (peripheral artery disease) ([place]) 03/21/2022  ? Aortoiliac occlusive disease ([place]) 03/21/2022  ? History of colonic polyps 11/04/2019  ? Uncontrolled type 2 diabetes mellitus with hyperglycemia ([place]) 03/09/2018  ? Carcinoid tumor of rectum   ? Essential hypertension 02/02/2015  ? Atherosclerosis of native arteries of extremity with intermittent claudication ([place]) 02/02/2015   ? Stress incontinence in female 03/06/2014  ? Diabetes mellitus ([place]) 02/28/2012  ? Pure hypercholesterolemia 02/28/2012  ? ?PCP:  Dorna Mai, MD ?Pharmacy:   ?[facility] [address] ?[place] [address] ?Phone: [phone] Fax: [phone] ? ? ?Readmission Risk Interventions ?   ? View : No data to display.  ?  ?  ?  ? ? ? ?

## 2022-03-31 DIAGNOSIS — I739 Peripheral vascular disease, unspecified: Secondary | ICD-10-CM | POA: Diagnosis not present

## 2022-03-31 LAB — COMPREHENSIVE METABOLIC PANEL
ALT: 22 U/L (ref 0–44)
AST: 33 U/L (ref 15–41)
Albumin: 2.7 g/dL — ABNORMAL LOW (ref 3.5–5.0)
Alkaline Phosphatase: 126 U/L (ref 38–126)
Anion gap: 5 (ref 5–15)
BUN: 38 mg/dL — ABNORMAL HIGH (ref 8–23)
CO2: 26 mmol/L (ref 22–32)
Calcium: 8.6 mg/dL — ABNORMAL LOW (ref 8.9–10.3)
Chloride: 117 mmol/L — ABNORMAL HIGH (ref 98–111)
Creatinine, Ser: 1.27 mg/dL — ABNORMAL HIGH (ref 0.44–1.00)
GFR, Estimated: 47 mL/min — ABNORMAL LOW (ref >60)
Glucose, Bld: 129 mg/dL — ABNORMAL HIGH (ref 70–99)
Potassium: 3.8 mmol/L (ref 3.5–5.1)
Sodium: 148 mmol/L — ABNORMAL HIGH (ref 135–145)
Total Bilirubin: 0.9 mg/dL (ref 0.3–1.2)
Total Protein: 6.2 g/dL — ABNORMAL LOW (ref 6.5–8.1)

## 2022-03-31 LAB — BASIC METABOLIC PANEL
Anion gap: 5 (ref 5–15)
BUN: 37 mg/dL — ABNORMAL HIGH (ref 8–23)
CO2: 27 mmol/L (ref 22–32)
Calcium: 8.4 mg/dL — ABNORMAL LOW (ref 8.9–10.3)
Chloride: 117 mmol/L — ABNORMAL HIGH (ref 98–111)
Creatinine, Ser: 1.28 mg/dL — ABNORMAL HIGH (ref 0.44–1.00)
GFR, Estimated: 46 mL/min — ABNORMAL LOW (ref >60)
Glucose, Bld: 112 mg/dL — ABNORMAL HIGH (ref 70–99)
Potassium: 3.5 mmol/L (ref 3.5–5.1)
Sodium: 149 mmol/L — ABNORMAL HIGH (ref 135–145)

## 2022-03-31 LAB — CBC
HCT: 27.4 % — ABNORMAL LOW (ref 36.0–46.0)
Hemoglobin: 8.3 g/dL — ABNORMAL LOW (ref 12.0–15.0)
MCH: 28.9 pg (ref 26.0–34.0)
MCHC: 30.3 g/dL (ref 30.0–36.0)
MCV: 95.5 fL (ref 80.0–100.0)
Platelets: 272 10*3/uL (ref 150–400)
RBC: 2.87 MIL/uL — ABNORMAL LOW (ref 3.87–5.11)
RDW: 14.3 % (ref 11.5–15.5)
WBC: 14.9 10*3/uL — ABNORMAL HIGH (ref 4.0–10.5)
nRBC: 0 % (ref 0.0–0.2)

## 2022-03-31 LAB — GLUCOSE, CAPILLARY
Glucose-Capillary: 116 mg/dL — ABNORMAL HIGH (ref 70–99)
Glucose-Capillary: 121 mg/dL — ABNORMAL HIGH (ref 70–99)
Glucose-Capillary: 126 mg/dL — ABNORMAL HIGH (ref 70–99)
Glucose-Capillary: 105 mg/dL — ABNORMAL HIGH (ref 70–99)
Glucose-Capillary: 137 mg/dL — ABNORMAL HIGH (ref 70–99)

## 2022-03-31 LAB — TRIGLYCERIDES: Triglycerides: 223 mg/dL — ABNORMAL HIGH (ref <150)

## 2022-03-31 LAB — MAGNESIUM: Magnesium: 2.5 mg/dL — ABNORMAL HIGH (ref 1.7–2.4)

## 2022-03-31 LAB — PHOSPHORUS: Phosphorus: 3.2 mg/dL (ref 2.5–4.6)

## 2022-03-31 LAB — APTT: aPTT: 36 seconds (ref 24–36)

## 2022-03-31 MED ORDER — TRACE MINERALS CU-MN-SE-ZN 300-55-60-3000 MCG/ML IV SOLN
INTRAVENOUS | Status: AC
  Filled 2022-03-31: qty 288

## 2022-03-31 MED ORDER — INSULIN ASPART 100 UNIT/ML IJ SOLN
0.0000 [IU] | INTRAMUSCULAR | Status: DC
  Administered 2022-03-31 (×2): 3 [IU] via SUBCUTANEOUS
  Administered 2022-04-01: 7 [IU] via SUBCUTANEOUS
  Administered 2022-04-01 (×2): 3 [IU] via SUBCUTANEOUS
  Administered 2022-04-02 (×2): 7 [IU] via SUBCUTANEOUS
  Administered 2022-04-02: 4 [IU] via SUBCUTANEOUS
  Administered 2022-04-02 (×3): 7 [IU] via SUBCUTANEOUS
  Administered 2022-04-03: 11 [IU] via SUBCUTANEOUS
  Administered 2022-04-03: 7 [IU] via SUBCUTANEOUS
  Administered 2022-04-03 (×2): 11 [IU] via SUBCUTANEOUS
  Administered 2022-04-03 – 2022-04-04 (×9): 7 [IU] via SUBCUTANEOUS
  Administered 2022-04-05: 4 [IU] via SUBCUTANEOUS
  Administered 2022-04-05 (×2): 7 [IU] via SUBCUTANEOUS
  Administered 2022-04-05: 4 [IU] via SUBCUTANEOUS
  Administered 2022-04-06 (×2): 7 [IU] via SUBCUTANEOUS
  Administered 2022-04-06 (×2): 4 [IU] via SUBCUTANEOUS
  Administered 2022-04-06: 3 [IU] via SUBCUTANEOUS
  Administered 2022-04-06: 7 [IU] via SUBCUTANEOUS
  Administered 2022-04-07: 3 [IU] via SUBCUTANEOUS
  Administered 2022-04-07 (×2): 4 [IU] via SUBCUTANEOUS
  Administered 2022-04-07 (×2): 3 [IU] via SUBCUTANEOUS
  Administered 2022-04-08 (×3): 4 [IU] via SUBCUTANEOUS
  Administered 2022-04-08 (×2): 7 [IU] via SUBCUTANEOUS
  Administered 2022-04-08 – 2022-04-09 (×2): 4 [IU] via SUBCUTANEOUS
  Administered 2022-04-09: 7 [IU] via SUBCUTANEOUS
  Administered 2022-04-09: 4 [IU] via SUBCUTANEOUS
  Administered 2022-04-09: 7 [IU] via SUBCUTANEOUS
  Administered 2022-04-09: 4 [IU] via SUBCUTANEOUS
  Administered 2022-04-09 – 2022-04-10 (×5): 3 [IU] via SUBCUTANEOUS
  Administered 2022-04-11 – 2022-04-12 (×6): 4 [IU] via SUBCUTANEOUS
  Administered 2022-04-12: 11 [IU] via SUBCUTANEOUS
  Administered 2022-04-12: 6 [IU] via SUBCUTANEOUS
  Administered 2022-04-12: 3 [IU] via SUBCUTANEOUS
  Administered 2022-04-12 – 2022-04-13 (×3): 7 [IU] via SUBCUTANEOUS
  Administered 2022-04-13: 11 [IU] via SUBCUTANEOUS
  Administered 2022-04-13: 4 [IU] via SUBCUTANEOUS
  Administered 2022-04-13 – 2022-04-14 (×3): 7 [IU] via SUBCUTANEOUS
  Administered 2022-04-14: 4 [IU] via SUBCUTANEOUS
  Administered 2022-04-14: 15 [IU] via SUBCUTANEOUS
  Administered 2022-04-14: 11 [IU] via SUBCUTANEOUS
  Administered 2022-04-14: 3 [IU] via SUBCUTANEOUS
  Administered 2022-04-14: 15 [IU] via SUBCUTANEOUS
  Administered 2022-04-15: 4 [IU] via SUBCUTANEOUS
  Administered 2022-04-15: 15 [IU] via SUBCUTANEOUS
  Administered 2022-04-15: 7 [IU] via SUBCUTANEOUS
  Administered 2022-04-15: 4 [IU] via SUBCUTANEOUS
  Administered 2022-04-15: 11 [IU] via SUBCUTANEOUS

## 2022-03-31 NOTE — Progress Notes (Signed)
Progress Note  Patient Name: Gabrielle [name] Date of Encounter: 03/31/2022  Primary Cardiologist:   Janina Mayo, MD   Subjective   The patient is awake on the ventilator and denies chest pain.    Inpatient Medications    Scheduled Meds:  artificial tears  1 application. Both Eyes Q8H   aspirin  81 mg Per Tube Daily   bacitracin   Topical TID   chlorhexidine gluconate (MEDLINE KIT)  15 mL Mouth Rinse BID   Chlorhexidine Gluconate Cloth  6 each Topical Daily   clonazePAM  1 mg Per Tube BID   docusate  100 mg Per Tube BID   furosemide  40 mg Intravenous Daily   heparin injection (subcutaneous)  5,000 Units Subcutaneous Q8H   insulin aspart  0-15 Units Subcutaneous Q4H   insulin aspart  6 Units Subcutaneous Q4H   insulin glargine-yfgn  15 Units Subcutaneous Daily   leptospermum manuka honey  1 application. Topical Daily   mouth rinse  15 mL Mouth Rinse 10 times per day   metoCLOPramide (REGLAN) injection  10 mg Intravenous Q6H   pantoprazole (PROTONIX) IV  40 mg Intravenous QHS   polyethylene glycol  17 g Per Tube Daily   rosuvastatin  40 mg Per Tube Daily   sodium chloride flush  10-40 mL Intracatheter Q12H   sodium chloride flush  10-40 mL Intracatheter Q12H   Continuous Infusions:  sodium chloride     sodium chloride 10 mL/hr at 03/30/22 2000   dexmedetomidine (PRECEDEX) IV infusion 0.9 mcg/kg/hr (03/31/22 0600)   magnesium sulfate bolus IVPB     PRN Meds: Place/Maintain arterial line **AND** sodium chloride, sodium chloride, acetaminophen (TYLENOL) oral liquid 160 mg/5 mL, bisacodyl, fentaNYL (SUBLIMAZE) injection, fentaNYL (SUBLIMAZE) injection, guaiFENesin-dextromethorphan, hydrALAZINE, labetalol, lip balm, magnesium sulfate bolus IVPB, ondansetron, phenol, sodium chloride flush   Vital Signs    Vitals:   03/31/22 0400 03/31/22 0432 03/31/22 0500 03/31/22 0600  BP: (!) 110/59     Pulse: 85  77 75  Resp: (!) 23  (!) 21 20  Temp: 97.8 F (36.6 C)      TempSrc: Axillary     SpO2: 98%  98% 99%  Weight:  58 kg    Height:        Intake/Output Summary (Last 24 hours) at 03/31/2022 0739 Last data filed at 03/31/2022 0600 Gross per 24 hour  Intake 298.29 ml  Output 4079 ml  Net -3780.71 ml   Filed Weights   03/29/22 0400 03/30/22 0500 03/31/22 0432  Weight: 63.7 kg 63.4 kg 58 kg    Telemetry    NSR - Personally Reviewed  ECG    NSR, continue anterolateral T wave inversion .  QT prolonged.  - Personally Reviewed  Physical Exam   GEN:     Acutely ill apearing Neck: No JVD Cardiac: RRR, no murmurs, rubs, or gallops.  Respiratory:     Decreased breath sounds GI: Soft, nontender, distended  MS:    Diffuse non pitting edema; No deformity. Neuro:  Nonfocal   Labs    Chemistry Recent Labs  Lab 03/24/22 0843 03/24/22 1641 03/29/22 0410 03/30/22 0307 03/31/22 0425  NA  --    < > 141 144 149*  K  --    < > 3.9 3.9 3.5  CL  --    < > 107 111 117*  CO2  --    < > 25 26 27   GLUCOSE  --    < >  235* 168* 112*  BUN  --    < > 36* 35* 37*  CREATININE  --    < > 1.21* 1.21* 1.28*  CALCIUM  --    < > 8.8* 9.1 8.4*  PROT 6.7  --   --   --   --   ALBUMIN 3.9  --   --   --   --   AST 68*  --   --   --   --   ALT 16  --   --   --   --   ALKPHOS 44  --   --   --   --   BILITOT 2.4*  --   --   --   --   GFRNONAA  --    < > 50* 50* 46*  ANIONGAP  --    < > 9 7 5    < > = values in this interval not displayed.     Hematology Recent Labs  Lab 03/28/22 0333 03/30/22 0307 03/31/22 0425  WBC 10.9* 15.8* 14.9*  RBC 3.34* 3.70* 2.87*  HGB 9.9*  9.7* 10.9* 8.3*  HCT 29.0*  30.8* 34.6* 27.4*  MCV 92.2 93.5 95.5  MCH 29.0 29.5 28.9  MCHC 31.5 31.5 30.3  RDW 15.2 14.8 14.3  PLT 214 342 272    Cardiac EnzymesNo results for input(s): TROPONINI in the last 168 hours. No results for input(s): TROPIPOC in the last 168 hours.   BNP Recent Labs  Lab 03/30/22 0307  BNP 559.7*     DDimer No results for input(s): DDIMER in the  last 168 hours.   Radiology    DG Abd Portable 1V  Result Date: 03/30/2022 CLINICAL DATA:  Feeding tube placement. EXAM: PORTABLE ABDOMEN - 1 VIEW COMPARISON:  Radiographs 03/30/2022 and 03/28/2022.  CT 02/02/2015. FINDINGS: At 1302 hours. A feeding tube projects over the upper lumbar spine, likely in the distal stomach. Enteric tube projects over the proximal stomach. Generalized gaseous distention of the small and large bowel appears mildly improved from the examination earlier today. IMPRESSION: New feeding tube projects to the level of the distal stomach. Electronically Signed   By: Richardean Sale M.D.   On: 03/30/2022 13:17   DG Abd Portable 1V  Result Date: 03/30/2022 CLINICAL DATA:  Abdominal distension. EXAM: PORTABLE ABDOMEN - 1 VIEW COMPARISON:  Radiographs 03/28/2022 and 03/27/2022.  CT 01/13/2022. FINDINGS: 0858 hours. Enteric tube appears looped in the stomach. The stomach is partially decompressed. Generalized gaseous distention of the small bowel and proximal colon appears similar to recent prior studies. The distal colon is relatively decompressed. No supine evidence of free intraperitoneal air or suspicious abdominal calcification. Aortoiliac atherosclerosis noted. IMPRESSION: Similar appearance of small bowel and proximal colonic distension following recent aortobifemoral bypass grafting. Findings remain most consistent with a postoperative ileus, although given persistence, CT might be considered for further evaluation. Electronically Signed   By: Richardean Sale M.D.   On: 03/30/2022 09:06   [place] EKG SITE RITE  Result Date: 03/30/2022 If Site Rite image not attached, placement could not be confirmed due to current cardiac rhythm.   Cardiac Studies   Echo:   1. Left ventricular ejection fraction, by estimation, is 20 to 25%. The  left ventricle has severely decreased function. The left ventricle  demonstrates global hypokinesis. Left ventricular diastolic parameters are   consistent with Grade II diastolic  dysfunction (pseudonormalization). Elevated left atrial pressure.   2. Right ventricular systolic function was not  well visualized. The right  ventricular size is normal.   3. Mild mitral valve regurgitation.   4. The aortic valve is tricuspid. Aortic valve regurgitation is not  visualized. Aortic valve sclerosis is present, with no evidence of aortic  valve stenosis.   5. The inferior vena cava is dilated in size with <50% respiratory  variability, suggesting right atrial pressure of 15 mmHg.   Patient Profile     66 y.o. female with a hx of PVD who is being seen for the evaluation of acute systolic HF at the request of Dr. Shearon Stalls.  Status post aortobifem with a 14 x 7 bifurcated Dacron graft and bilateral femoral endarterectomy.  Left-sided profunda endarterectomy with bovine pericardial patch.  Post op cardiogenic shock.   Assessment & Plan    Cardiogenic shock:  Today sedation is off and she is having no distress.  EKG with continued anterolateral T wave inversion slightly less pronounced.  Denies pain.  Oxygenating OK.  No pressors.  No room for med titration for reduced EF.  Plan is elective cath before discharge but no need for urgent invasive study at this moment.  Continue ASA.     Hgb drifting down.  Lower threshold for transfusion given the possibility of significant CAD.    AKI:  Creat is mildly increased and stable.     For questions or updates, please contact [place] Please consult [email or web address] for contact info under Cardiology/STEMI.   Signed, Minus Breeding, MD  03/31/2022, 7:39 AM

## 2022-03-31 NOTE — Progress Notes (Signed)
Brief nutrition note:   TPN to start today at 1800.   Pt remains on vent support.   +multiple BMs yesterday, total of 2.5 L output via NG tube. Cortrak remains in place.  NG output improved today. Abdomen less taut today but remains distended.   RD reached out to TPN Pharmacists as well as PCCM regarding poc.   Concern re: blood sugar management given CBGs have been >200 despite minimal nutrition and now starting IV nutrition that contains dextrose. Noted insulin to be added to TPN today. If CBGs uncontrolled on follow-up, recommend considering insulin drip until CBGs improved and TPN stable at goal rate. Pt with inadequate nutrition x 10 days now and hopeful to be able to increase TPN to goal rate tomorrow to optimize nutrition moving forward.   Sodium has been trending up; currently 148. Noted pt received lasix yesterday, holding lasix today. Currently not receiving any water per tube or any IVF. Unable to give free water per tube currently. TPN Pharmacist indicating that team wanting to limit fluid volume. Discussed possibility of D5 infusion today until TPN starts with PCCM, wanting to hold for now. RD to monitor labs closely.   Pt is a refeeding risk  Kerman Passey MS, RDN, LDN, CNSC Registered Dietitian III Clinical Nutrition RD Pager and On-Call Pager Number Located in [place]

## 2022-03-31 NOTE — Progress Notes (Addendum)
Aortic Surgery Progress Note    03/31/2022 6:55 AM 10 Days Post-Op  Subjective:  opens eyes to voice and stimulus.  Tries to follow commands  Afebrile HR 120's>>70's this am NSR 710'G-269'S systolic 85% .40FiO2  Gtts:   Precedex  Vitals:   03/31/22 0500 03/31/22 0600  BP:    Pulse: 77 75  Resp: (!) 21 20  Temp:    SpO2: 98% 99%    Physical Exam: Cardiac:  regular Lungs:  intubated on .40 FiO2 Abdomen:  still distended but softer today Incisions:  all incisions look fine Extremities:  +PT doppler signals bilaterally General:  no distress  CBC    Component Value Date/Time   WBC 14.9 (H) 03/31/2022 0425   RBC 2.87 (L) 03/31/2022 0425   HGB 8.3 (L) 03/31/2022 0425   HGB 14.5 03/22/2021 0916   HCT 27.4 (L) 03/31/2022 0425   HCT 43.9 03/22/2021 0916   PLT 272 03/31/2022 0425   PLT 261 03/22/2021 0916   MCV 95.5 03/31/2022 0425   MCV 90 03/22/2021 0916   MCH 28.9 03/31/2022 0425   MCHC 30.3 03/31/2022 0425   RDW 14.3 03/31/2022 0425   RDW 12.5 03/22/2021 0916   LYMPHSABS 2.8 02/03/2015 0527   MONOABS 0.4 02/03/2015 0527   EOSABS 0.1 02/03/2015 0527   BASOSABS 0.0 02/03/2015 0527    BMET    Component Value Date/Time   NA 149 (H) 03/31/2022 0425   NA 137 03/22/2021 0916   K 3.5 03/31/2022 0425   CL 117 (H) 03/31/2022 0425   CO2 27 03/31/2022 0425   GLUCOSE 112 (H) 03/31/2022 0425   BUN 37 (H) 03/31/2022 0425   BUN 11 03/22/2021 0916   CREATININE 1.28 (H) 03/31/2022 0425   CREATININE 0.74 11/11/2015 1921   CALCIUM 8.4 (L) 03/31/2022 0425   GFRNONAA 46 (L) 03/31/2022 0425   GFRNONAA >89 11/11/2015 1921   GFRAA 78 10/23/2020 1701   GFRAA >89 11/11/2015 1921    INR    Component Value Date/Time   INR 1.1 03/27/2022 0510     Intake/Output Summary (Last 24 hours) at 03/31/2022 0655 Last data filed at 03/31/2022 0600 Gross per 24 hour  Intake 298.29 ml  Output 3475 ml  Net -3176.71 ml     Assessment/Plan:  66 y.o. female is s/p   Aortobifemoral bypass using 14 x 7 bifurcated Dacron graft Bilateral femoral endarterectomy,  Left-sided profunda endarterectomy with bovine pericardial patch 10 Days Post-Op   -Vascular:  continues with PT doppler signals bilaterally -Cardiac:  continues to not require pressor support.  Echo on 03/23/2021 shows severely reduced EF of 20 to 25% compared to 55 to 60% preop.  Component of cardiogenic shock.  cardiology consulted yesterday and felt high possibility of underlying CAD.  Enzymes continuing to trend downward.  She did have deep anterolateral T wave inversions.  Oxygenating well. It was felt if they were having trouble weaning sedation that she can be taken to the cath lab to evaluate for CAD.   -Pulmonary:  intubated on FiO2 of .40  -Neuro:  awakes to voice and stimulus.  Appears to try to follow commands.  Moving BUE; slight movement of toes on both feet -Renal:  creatinine trending upward slightly at 1.28 from 1.21 the past couple of days.  -GI:  abdomen still distended but softer today.  TF currently on hold and NGT back to suction.  Dr. Carlis Abbott did not think she has a mechanical obstruction given she continued to have bowel  function.  PICC line started for TPN. -Incisions:  all incisions continue to heal nicely. -Heme/ID:  leukocytosis down slightly at 14.9 from 15.8 yesterday.  On Zosyn for aspiration pna.   Acute blood loss anemia is down to 8.3 from 10.9 yesterday.  Given decreased EF, may require transfusion, otherwise, she is tolerating.  -General:  no distress -wound care consulted yesterday for unstageable pressure injury to sacrococcygeal area that measures 7 x 13cm.    Appreciate cardiology and CCM assistance Labs tomorrow   Leontine Locket, [facility] [phone] 03/31/2022 6:55 AM   VASCULAR STAFF ADDENDUM: I have independently interviewed and examined the patient. I agree with the above.  10 Days Post-Op ABF Case complicated by  postoperative bleeding, cardiogenic shock, acute respiratory failure requiring reintubation Seems improved from yesterday. Multiple bowel movements overnight, abdomen much softer than previous No bloody bowel movements Currently NG tube to suction, 600 mL out overnight, significantly less output over the course of the day My plan, should the patient continue to have bowel function, would be strict starting trickle feeds tomorrow, continuing to supplement with TPN. From a respiratory standpoint, patient doing well with pressure support trial.  Ongoing. We will need cardiac cath, we will have short trigger to move forward with this should she complain of any chest pain Recommend continued daily labs Appreciate holding diuresis due to AKI Continue Zosyn due to pneumonia Appreciate excellent care from ICU nursing and critical care.   Cassandria Santee, MD [facility] Phone Number: [phone] 03/31/2022 11:49 AM

## 2022-03-31 NOTE — Progress Notes (Addendum)
NAME:  Gabrielle [name], MRN:  [id number], DOB:  05/06/1956, LOS: 20 ADMISSION DATE:  03/21/2022, CONSULTATION DATE:  03/22/22 REFERRING MD:  Unk Lightning, CHIEF COMPLAINT:  abd pain   History of Present Illness:  66 year old woman with hx of rectal cancer, DM2, HTN, HLD who presented for open aortobifemoral bypass by Dr. Unk Lightning for critical limb ischemia.  Postop course complicated by shock for which PCCM is consulted.  Patient underwent procedure with 14-7 bifurcated dacron graft, bilateral femoral endarterectomy late yesterday.  EBL 1.5L.  Currently c/o SOB and abdominal discomfort.  Pertinent  Medical History  DM2- A1c 9.3% HTN HLD Severe PVD  Significant Hospital Events: Including procedures, antibiotic start and stop dates in addition to other pertinent events   5/8  Aortobifemoral bypass using 14 x 7 bifurcated Dacron graft Bilateral femoral endarterectomy,  Left-sided profunda endarterectomy with bovine pericardial patch 5/9 PCCM consult for hypotension 5/10 worsening respiratory distress, intubated 5/11 diuresis 5/16 abdominal distention, increase bowel reg, attempts to wean from ventilator limited by low volumes 5/17 tachycardic, c/f STEMI, per cardiology no STEMI, PICC line placed for TPN 5/18 return of bowel function   Interim History / Subjective:  Overnight, patient had multiple large bowel movements. Continued to have emesis as well. Precedex had to be restarted because of agitation.   This morning, patient is awake and follows commands. Makes purposeful movements toward ETT. Wiggles toes on R, not L.   Objective   Blood pressure (!) 110/59, pulse 75, temperature 97.8 F (36.6 C), temperature source Axillary, resp. rate 20, height '4\' 9"'$  (1.448 m), weight 58 kg, SpO2 99 %. CVP:  [0 mmHg-51 mmHg] 51 mmHg  Vent Mode: PRVC FiO2 (%):  [40 %] 40 % Set Rate:  [20 bmp] 20 bmp Vt Set:  [300 mL] 300 mL PEEP:  [5 cmH20] 5 cmH20 Plateau Pressure:  [17 cmH20-25 cmH20] 18 cmH20    Intake/Output Summary (Last 24 hours) at 03/31/2022 0720 Last data filed at 03/31/2022 0600 Gross per 24 hour  Intake 298.29 ml  Output 4079 ml  Net -3780.71 ml    Filed Weights   03/29/22 0400 03/30/22 0500 03/31/22 0432  Weight: 63.7 kg 63.4 kg 58 kg    Examination: General appearance: 66 y.o., female, awake, in mits with mild agitation  Lungs: coarse bl, scattered crackles CV: RRR, no murmur  Abdomen: Soft, less distended, tender to palpation on R side, bowel sounds present Extremities: No peripheral edema, warm Skin: Normal turgor and texture; no rash Neuro: Follows commands to open eyes and wiggles toes on R, not L.   Labs/imaging reviewed:  Sodium 149 Chloride 117 Creatinine 1.21 -> 1.28 WBC 15.8 -> 14.9 Hgb 10.9 -> 8.3  Resolved Hospital Problem list   Postoperative shock  Assessment & Plan:  Acute hypoxemic respiratory failure  C/f aspiration pneumonia Patient remains stable on 40% FiO2 PRVC. Has failed SBT x2 days because of low tidal volumes.  Patient is more awake today, motioning towards the ETT. Exam notable for coarse breath sounds and scattered crackles. She has had multble bowel movements and abdomen is much softer. Will reattempt SBT today, if fails will consider repeat CXR and potential cath with cardiology. If unable to wean today, will likely start conversations with family regarding possible trach placement. - daily SBTs  - hold diuresis  - s/p zosyn x 5 days, tracheal aspirate with no growth  Postoperative Ileus Return of bowel function yesterday, with multiple large volume stools. Abdomen is soft and less  distended on exam, although notable for right sided tenderness perhaps due to surgical site pain. With continuing emesis, hesitant to resume feeding by mouth so will start TPN today. - s/p sorbitol yesterday - continue miralax, colace - discontinue reglan  - zofran for nausea - start TPN today  Elevated Troponin  New Heart Failure Reduced  EF Suspected ICM Patient had acute event with tachycardia, EKG changes and elevated troponin. Echo from 5/10 showed EF of 20%, reduced from normal echo on 5/9. At that time she had troponin elevation which peaked at 5000 but no EKG changes. Per cardiology, if symptoms return when she is off sedation she may need a heart cath. So far, she has been stable off sedation so will likely plan for outpatient cath.  - cardiology consult, appreciate assistance - monitor on telemetry  Acute Kidney Injury Patient with elevated creatinine, normal kidney function at baseline. Creatinine increased from yesterday in the setting of lasix administration. Will hold diuresis for today. - daily BMP - strict I/Os  Acute metabolic encephalopathy Had to restart precedex overnight due to agitation. Patient awake and following commands this morning, but alertness level appears to wax and wane. Likely has a component of delirium. Hoping to stay off sedation for SBT today.  - wean precedex/fentanyl as able  PAD s/p Aortobifemoral bypass  HTN, HLD hx  Vascular surgery following. Surgical site clear, dry and intact, perfusion to lower extremities stable. Has not been wiggling L toes. - continue statin, ASA  DM2 CBGs range from 97-141. Will likely adjust insulin regimen with TPN. - continue semlgee to 15U daily - continue 6U aspart q4hrs, SSI  Anemia of critical illness Had 2 point drop in hgb today, but she seems to be between 8-9 at baseline. No signs of active bleeding. - ctm  Best Practice (right click and "Reselect all SmartList Selections" daily)   Diet/type: NPO DVT prophylaxis: heparin ppx GI prophylaxis: N/A Lines: Central line Foley:  Yes, and it is still needed Code Status:  full code Last date of multidisciplinary goals of care discussion: Dr. Shearon Stalls updated husband at bedside 5/16  Excell Seltzer, MS4   Attending Attestation:  She is improved today and was following commands and doing well on PS  trial this morning. On my repeat exam more sedated. Would hold all sedation today and just keep prn fentanyl. Her abdomen is much softer after multiple bowel movements. She is due to start TPN later today. Hopefully we can get her extubated int he next 24-48 hours otherwise might need to talk about trach with family. I am feeling optimistic we can get her off. Continue bowel regimen. She will need a left heart cath at a future date given her ST segment change and new heart failure.   The patient is critically ill due to respiratory failure.  Critical care was necessary to treat or prevent imminent or life-threatening deterioration.  Critical care was time spent personally by me on the following activities: development of treatment plan with patient and/or surrogate as well as nursing, discussions with consultants, evaluation of patient's response to treatment, examination of patient, obtaining history from patient or surrogate, ordering and performing treatments and interventions, ordering and review of laboratory studies, ordering and review of radiographic studies, pulse oximetry, re-evaluation of patient's condition and participation in multidisciplinary rounds.   Critical Care Time devoted to patient care services described in this note is 40 minutes. This time reflects time of care of this [place] . This  critical care time does not reflect separately billable procedures or procedure time, teaching time or supervisory time of PA/NP/Med student/Med Resident etc but could involve care discussion time.       Spero Geralds [facility] 03/31/2022 10:25 AM  Pager: see AMION  If no response to pager , please call critical care on call (see AMION) until 7pm After 7:00 pm call [place]

## 2022-03-31 NOTE — Progress Notes (Signed)
PHARMACY - TOTAL PARENTERAL NUTRITION CONSULT NOTE   Indication: Prolonged ileus  Patient Measurements: Height: '4\' 9"'$  (144.8 cm) Weight: 58 kg (127 lb 13.9 oz) IBW/kg (Calculated) : 38.6 TPN AdjBW (KG): 40.6 Body mass index is 27.67 kg/m. Usual Weight: ~118 lbs per chart review   Assessment:  66 yo W admitted with BL LE critical limb ischemia s/p aortofemoral bypass, BL femoral endarterectomy and Left-sided profunda endarterectomy with bovine pericardial patch 5/8. Patient with cardiogenic shock post-procedure now fluid overloaded. Patient with return of bowel function and multiple large stools 5/17, however, continues to have emesis and high NGT output. Imagining consistent with prolonged ileus. Patient received some trickle TF 5/11-5/13 and 5/16. While on TF (received up to 76g dextrose/24hr), patient required 41- 60 units of insulin. Patient is at risk for refeeding. Pharmacy consulted for TPN.   Of note, triglycerides are elevated at baseline, which limits lipid amount in TPN. Discussed fluid status with team who recommends fluid restriction.   Glucose / Insulin: BG < 130, A1C 9.2,  used 15 units insulin aspart and 15 units insulin glargine in last 24 hours Electrolytes: Na 148, K 3.8 (goal >/=4), Cl 117, CoCa 9.6, Mg 2.5 other wnl  Renal: Scr 1.27, BUN 38; gave furosemide IV x1 Hepatic: LFT/Tbili wnl, albumin 2.7, TG 223 Intake / Output; MIVF: UOP 1.2 ml/kg/hr, NGT 2423m, LBM x4 5/17 GI Imaging: 5/8 KUB: unremarkable 5/13 KUB: increased small bowel gas, ileus vs obstruction 5/14 KUB: ileus vs SBO  5/15 KUB: Similar  gaseous distension of small bowel likely resolving ileus  5/17 KUB: Similar small bowel and proximal colonic distension consistent with postoperative ileus  GI Surgeries / Procedures:  5/17 NGT placement   Central access: PICC 5/17  TPN start date:   Nutritional Goals: Goal TPN rate is 60 mL/hr, 66 g/L of 15% protein, 18% dextrose, 39g/L lipids (provides 95 g of  protein, 259 g dextrose, 56 g lipids and 1823 kcals per day) Goal TPN rate with decrease lipids is 55 ml/hr, 72 g/L of 15% protein, 24% dextrose, 22 g/L lipids (provides 95 g of protein, 317 g dextrose, 29 g lipids and 1747 kcals per day)  RD Assessment: Estimated Needs Total Energy Estimated Needs: 1750-1950 kcals Total Protein Estimated Needs: 95-110 g Total Fluid Estimated Needs: >/= 1.7 L  Current Nutrition:  NPO and TPN  Plan:  Start concentrated TPN at 25 mL/hr at 1800 with decreased lipids (provides 43 g protein, 144 g dextrose, 13g lipids, 794 kcal meeting ~45% of estimated needs) Electrolytes in TPN: Na 25 mEq/L, K 40 mEq/L, Ca 8 mEq/L, Mg 0 mEq/L, and Phos 15 mmol/L. Cl:Ac 1:2 Add standard MVI and trace elements to TPN Hold long-acting insulin and scheduled short-acting insulin, add 75 units insulin in TPN (insulin amount calculated based on insulin to carb ratio when patient was on trickle tube feeds) Increase to resistant  q4h SSI and adjust as needed  Monitor TPN labs daily until stable at goal then on Mon/Thurs F/u triglyceride trend and increase lipid (and decrease dextrose) in TPN as able    LBenetta Spar PharmD, BCPS, BVa Northern Arizona Healthcare SystemClinical Pharmacist  Please check AMION for all MPrienphone numbers After 10:00 PM, call [place]

## 2022-04-01 ENCOUNTER — Encounter: Payer: BC Managed Care – PPO | Admitting: Family Medicine

## 2022-04-01 DIAGNOSIS — I739 Peripheral vascular disease, unspecified: Secondary | ICD-10-CM | POA: Diagnosis not present

## 2022-04-01 DIAGNOSIS — J9601 Acute respiratory failure with hypoxia: Secondary | ICD-10-CM | POA: Diagnosis not present

## 2022-04-01 LAB — COMPREHENSIVE METABOLIC PANEL
ALT: 19 U/L (ref 0–44)
AST: 28 U/L (ref 15–41)
Albumin: 2.5 g/dL — ABNORMAL LOW (ref 3.5–5.0)
Alkaline Phosphatase: 112 U/L (ref 38–126)
Anion gap: 4 — ABNORMAL LOW (ref 5–15)
BUN: 37 mg/dL — ABNORMAL HIGH (ref 8–23)
CO2: 28 mmol/L (ref 22–32)
Calcium: 8.9 mg/dL (ref 8.9–10.3)
Chloride: 118 mmol/L — ABNORMAL HIGH (ref 98–111)
Creatinine, Ser: 1.14 mg/dL — ABNORMAL HIGH (ref 0.44–1.00)
GFR, Estimated: 53 mL/min — ABNORMAL LOW (ref >60)
Glucose, Bld: 365 mg/dL — ABNORMAL HIGH (ref 70–99)
Potassium: 4.1 mmol/L (ref 3.5–5.1)
Sodium: 150 mmol/L — ABNORMAL HIGH (ref 135–145)
Total Bilirubin: 1 mg/dL (ref 0.3–1.2)
Total Protein: 6.3 g/dL — ABNORMAL LOW (ref 6.5–8.1)

## 2022-04-01 LAB — BASIC METABOLIC PANEL
Anion gap: 4 — ABNORMAL LOW (ref 5–15)
BUN: 32 mg/dL — ABNORMAL HIGH (ref 8–23)
CO2: 23 mmol/L (ref 22–32)
Calcium: 7.4 mg/dL — ABNORMAL LOW (ref 8.9–10.3)
Chloride: 126 mmol/L — ABNORMAL HIGH (ref 98–111)
Creatinine, Ser: 0.92 mg/dL (ref 0.44–1.00)
GFR, Estimated: >60 mL/min (ref >60)
Glucose, Bld: 70 mg/dL (ref 70–99)
Potassium: 3 mmol/L — ABNORMAL LOW (ref 3.5–5.1)
Sodium: 153 mmol/L — ABNORMAL HIGH (ref 135–145)

## 2022-04-01 LAB — GLUCOSE, CAPILLARY
Glucose-Capillary: 129 mg/dL — ABNORMAL HIGH (ref 70–99)
Glucose-Capillary: 144 mg/dL — ABNORMAL HIGH (ref 70–99)
Glucose-Capillary: 201 mg/dL — ABNORMAL HIGH (ref 70–99)
Glucose-Capillary: 240 mg/dL — ABNORMAL HIGH (ref 70–99)
Glucose-Capillary: 88 mg/dL (ref 70–99)
Glucose-Capillary: 91 mg/dL (ref 70–99)
Glucose-Capillary: 94 mg/dL (ref 70–99)

## 2022-04-01 LAB — MAGNESIUM
Magnesium: 2.6 mg/dL — ABNORMAL HIGH (ref 1.7–2.4)
Magnesium: 2.2 mg/dL (ref 1.7–2.4)

## 2022-04-01 LAB — TRIGLYCERIDES: Triglycerides: 225 mg/dL — ABNORMAL HIGH (ref <150)

## 2022-04-01 LAB — PHOSPHORUS
Phosphorus: 3.5 mg/dL (ref 2.5–4.6)
Phosphorus: 2.6 mg/dL (ref 2.5–4.6)

## 2022-04-01 LAB — APTT: aPTT: 28 seconds (ref 24–36)

## 2022-04-01 MED ORDER — TRAVASOL 10 % IV SOLN
INTRAVENOUS | Status: AC
  Filled 2022-04-01: qty 957.6

## 2022-04-01 MED ORDER — DEXTROSE 5 % IV SOLN: INTRAVENOUS | Status: AC

## 2022-04-01 MED ORDER — POTASSIUM CHLORIDE 10 MEQ/50ML IV SOLN
10.0000 meq | INTRAVENOUS | Status: AC
  Administered 2022-04-01 (×5): 10 meq via INTRAVENOUS
  Filled 2022-04-01 (×2): qty 50

## 2022-04-01 MED ORDER — OXYCODONE HCL 5 MG PO TABS
5.0000 mg | ORAL_TABLET | Freq: Four times a day (QID) | ORAL | Status: DC
  Administered 2022-04-01 – 2022-04-04 (×12): 5 mg
  Filled 2022-04-01 (×12): qty 1

## 2022-04-01 NOTE — Progress Notes (Signed)
Progress Note  Patient Name: Gabrielle [name] Date of Encounter: 04/01/2022  Primary Cardiologist:   Janina Mayo, MD   Subjective   She is awake and denies pain or SOB.   Inpatient Medications    Scheduled Meds:  artificial tears  1 application. Both Eyes Q8H   aspirin  81 mg Per Tube Daily   bacitracin   Topical TID   chlorhexidine gluconate (MEDLINE KIT)  15 mL Mouth Rinse BID   Chlorhexidine Gluconate Cloth  6 each Topical Daily   clonazePAM  1 mg Per Tube BID   docusate  100 mg Per Tube BID   heparin injection (subcutaneous)  5,000 Units Subcutaneous Q8H   insulin aspart  0-20 Units Subcutaneous Q4H   leptospermum manuka honey  1 application. Topical Daily   mouth rinse  15 mL Mouth Rinse 10 times per day   pantoprazole (PROTONIX) IV  40 mg Intravenous QHS   polyethylene glycol  17 g Per Tube Daily   rosuvastatin  40 mg Per Tube Daily   sodium chloride flush  10-40 mL Intracatheter Q12H   sodium chloride flush  10-40 mL Intracatheter Q12H   Continuous Infusions:  sodium chloride     sodium chloride 10 mL/hr at 03/30/22 2000   dexmedetomidine (PRECEDEX) IV infusion Stopped (04/01/22 0714)   magnesium sulfate bolus IVPB     TPN ADULT (ION) Stopped (04/01/22 0751)   PRN Meds: Place/Maintain arterial line **AND** sodium chloride, sodium chloride, acetaminophen (TYLENOL) oral liquid 160 mg/5 mL, bisacodyl, fentaNYL (SUBLIMAZE) injection, fentaNYL (SUBLIMAZE) injection, guaiFENesin-dextromethorphan, hydrALAZINE, labetalol, lip balm, magnesium sulfate bolus IVPB, ondansetron, phenol, sodium chloride flush   Vital Signs    Vitals:   04/01/22 0700 04/01/22 0753 04/01/22 0800 04/01/22 0824  BP: (!) 112/56  (!) 119/57   Pulse: 70 72 78   Resp: 20 (!) 21 (!) 30   Temp:    99 F (37.2 C)  TempSrc:    Oral  SpO2: 100% 100% 99%   Weight:      Height:        Intake/Output Summary (Last 24 hours) at 04/01/2022 0921 Last data filed at 04/01/2022 0800 Gross per 24 hour   Intake 485.27 ml  Output 1225 ml  Net -739.73 ml   Filed Weights   03/30/22 0500 03/31/22 0432 04/01/22 0500  Weight: 63.4 kg 58 kg 58 kg    Telemetry    SR, ST - Personally Reviewed  ECG    NA  Physical Exam   GEN: No acute distress.   Neck: No  JVD Cardiac: RRR, no murmurs, rubs, or gallops.  Respiratory: Clear   to auscultation bilaterally. GI: Soft, nontender, non-distended, normal bowel sounds  MS:  Mild edema; No deformity. Neuro:   Nonfocal  Psych: Unable to assess.     Labs    Chemistry Recent Labs  Lab 03/31/22 0735 04/01/22 0440 04/01/22 0751  NA 148* 150* 153*  K 3.8 4.1 3.0*  CL 117* 118* 126*  CO2 _0 GLUCOSE 129* 365* 70  BUN 38* 37* 32*  CREATININE 1.27* 1.14* 0.92  CALCIUM 8.6* 8.9 7.4*  PROT 6.2* 6.3*  --   ALBUMIN 2.7* 2.5*  --   AST 33 28  --   ALT 22 19  --   ALKPHOS 126 112  --   BILITOT 0.9 1.0  --   GFRNONAA 47* 53* >60  ANIONGAP 5 4* 4*     Hematology Recent Labs  Lab 03/28/22 0333 03/30/22 0307 03/31/22 0425  WBC 10.9* 15.8* 14.9*  RBC 3.34* 3.70* 2.87*  HGB 9.9*  9.7* 10.9* 8.3*  HCT 29.0*  30.8* 34.6* 27.4*  MCV 92.2 93.5 95.5  MCH 29.0 29.5 28.9  MCHC 31.5 31.5 30.3  RDW 15.2 14.8 14.3  PLT 214 342 272    Cardiac EnzymesNo results for input(s): TROPONINI in the last 168 hours. No results for input(s): TROPIPOC in the last 168 hours.   BNP Recent Labs  Lab 03/30/22 0307  BNP 559.7*     DDimer No results for input(s): DDIMER in the last 168 hours.   Radiology    DG Abd Portable 1V  Result Date: 03/30/2022 CLINICAL DATA:  Feeding tube placement. EXAM: PORTABLE ABDOMEN - 1 VIEW COMPARISON:  Radiographs 03/30/2022 and 03/28/2022.  CT 02/02/2015. FINDINGS: At 1302 hours. A feeding tube projects over the upper lumbar spine, likely in the distal stomach. Enteric tube projects over the proximal stomach. Generalized gaseous distention of the small and large bowel appears mildly improved from the  examination earlier today. IMPRESSION: New feeding tube projects to the level of the distal stomach. Electronically Signed   By: Richardean Sale M.D.   On: 03/30/2022 13:17   [place] EKG SITE RITE  Result Date: 03/30/2022 If Site Rite image not attached, placement could not be confirmed due to current cardiac rhythm.   Cardiac Studies   Echo:   1. Left ventricular ejection fraction, by estimation, is 20 to 25%. The  left ventricle has severely decreased function. The left ventricle  demonstrates global hypokinesis. Left ventricular diastolic parameters are  consistent with Grade II diastolic  dysfunction (pseudonormalization). Elevated left atrial pressure.   2. Right ventricular systolic function was not well visualized. The right  ventricular size is normal.   3. Mild mitral valve regurgitation.   4. The aortic valve is tricuspid. Aortic valve regurgitation is not  visualized. Aortic valve sclerosis is present, with no evidence of aortic  valve stenosis.   5. The inferior vena cava is dilated in size with <50% respiratory  variability, suggesting right atrial pressure of 15 mmHg.   Patient Profile     66 y.o. female with a hx of PVD who is being seen for the evaluation of acute systolic HF at the request of Dr. Shearon Stalls.  Status post aortobifem with a 14 x 7 bifurcated Dacron graft and bilateral femoral endarterectomy.  Left-sided profunda endarterectomy with bovine pericardial patch.  Post op cardiogenic shock.   Assessment & Plan    Cardiogenic shock: BP improved off of pressors.   Appears to be dry.  Will follow CVP.  Agree however with giving some volume.  Likely able to titrate afterload reduction over the weekend.   She will need a cardiac cath next week pending continued recovery.  Not yet scheduled.     AKI:  Creat is back to baseline.   Hypernatremia: Agree with lifting free water restriction and starting some hydration.    For questions or updates, please contact [place] Please consult [email or web address] for contact info under Cardiology/STEMI.   Signed, Minus Breeding, MD  04/01/2022, 9:21 AM

## 2022-04-01 NOTE — Progress Notes (Addendum)
Nutrition Follow-up  DOCUMENTATION CODES:   Severe malnutrition in context of acute illness/injury  INTERVENTION:   Recommend continuing TPN until pt TF initiated and pt demonstrating tolerance and TF being titrated to goal successfully  Continue TPN to meet 100% estimated nutrition needs  Monitor for ability to trial TF Tube Feeding Recommendations:  Vital AF 1.2 at 20 ml/hr; titrate by 10 mL q 8 hours until goal rate of 70 ml/hr Goal regimen provides 1872 kcals, 117 g of protein and 1264 mL of free water  Sodium continues to trend up, D5 started today and providing more volume via TPN today. Recommend continuing D5 infusion and/or free water per tube (when able) until sodium corrected   NUTRITION DIAGNOSIS:   Severe Malnutrition related to acute illness as evidenced by mild fat depletion, moderate muscle depletion, energy intake < or equal to 50% for > or equal to 5 days.  Being addressee via TPN  GOAL:   Patient will meet greater than or equal to 90% of their needs  Progressing  MONITOR:   Vent status, Weight trends, Labs, TF tolerance  REASON FOR ASSESSMENT:   Consult, Ventilator Enteral/tube feeding initiation and management  ASSESSMENT:   66 yo female admitted for aortobifemoral bypass due to right-sided Rutherford 4 critical limb ischemia, left-sided Rutherford 3 critical limb ischemia.  Pt developed postop shock and respiratory failure requiring intubation.  PMH includes DM, HTN, HLD, severe PAD    5/08 Aortobifemoral bypass using 14 x 7 bifurcated Dacron graft, Bilateral femoral endarterectomy,  Left-sided profunda endarterectomy with bovine pericardial patch 5/09 PCCM consult for hypotension 5/10 Worsening respiratory status requiring intubation 5/11 Trickle TF initiated 5/12 Initiated TF titration 5/13 TF decreased back to trickle TF given abd distention: abd xray with dilated SB 5/17 TF continued at trickle TF rate until pt experienced large volume  emesis with additional NG suction 1.5 L out, TPN recommended, Cortrak placed-gastric, unable to get PP 5/18 TPN initiated  Pt remains on vent support; noted possible extubation today  NG with 650 mL dark output in 24 hours, 200 mL out thus far today. Abdomen remains distended but seems improve some daily. +BM. May be able to initiate trickle TF soon if continues to improve.   Sodium continuing to trend up, current value 153. Recommended free water administration yesterday, noted MD started D5W today,  unable to give flushes per NG at this time.   TPN initiated yesterday at 25 ml/hr; noted plan to go to goal rate today. TPN today with increased volume, no sodium addition  Noted 75 units in TPN yesterday; CBGs 88-137. Noted adjustments in insulin today.   Noted TPN with low lipid content; recommend not restricting fat as TG <400  Labs: sodium 153 H), potassium 3.0 (L) Meds: colace, miralax, ss novolog   Diet Order:   Diet Order             Diet NPO time specified  Diet effective now                   EDUCATION NEEDS:   Not appropriate for education at this time  Skin:  Skin Assessment: Skin Integrity Issues: Skin Integrity Issues:: Unstageable DTI: perineum Unstageable: sacrococcygeal: seen by WOC  Last BM:  5/19 BM  Height:   Ht Readings from Last 1 Encounters:  03/24/22 '4\' 9"'$  (1.448 m)    Weight:   Wt Readings from Last 1 Encounters:  04/01/22 58 kg   BMI:  Body mass index is  27.67 kg/m.  Estimated Nutritional Needs:   Kcal:  1750-1950 kcals  Protein:  95-110 g  Fluid:  >/= 1.7 L   Kerman Passey MS, RDN, LDN, CNSC Registered Dietitian III Clinical Nutrition RD Pager and On-Call Pager Number Located in [place]

## 2022-04-01 NOTE — Progress Notes (Addendum)
Aortic Surgery Progress Note    04/01/2022 6:52 AM 11 Days Post-Op  Subjective:  intubated/sedated.  RN just turned off Precedex  Afebrile HR 70's-100's NSR 29'J-188'C systolic 166% .[id number]  Gtts:   Precedex  Vitals:   04/01/22 0400 04/01/22 0500  BP: (!) 113/55 121/60  Pulse: 71 72  Resp: 20 (!) 21  Temp: 98.7 F (37.1 C)   SpO2: 100% 100%    Physical Exam: Cardiac:  regular Lungs:  intubated Abdomen:  still distended but softer; +BM yesterday and two small BM last night Incisions:  all incisions look good Extremities:  +doppler signals bilateral PT General:  resting comfortably  CBC    Component Value Date/Time   WBC 14.9 (H) 03/31/2022 0425   RBC 2.87 (L) 03/31/2022 0425   HGB 8.3 (L) 03/31/2022 0425   HGB 14.5 03/22/2021 0916   HCT 27.4 (L) 03/31/2022 0425   HCT 43.9 03/22/2021 0916   PLT 272 03/31/2022 0425   PLT 261 03/22/2021 0916   MCV 95.5 03/31/2022 0425   MCV 90 03/22/2021 0916   MCH 28.9 03/31/2022 0425   MCHC 30.3 03/31/2022 0425   RDW 14.3 03/31/2022 0425   RDW 12.5 03/22/2021 0916   LYMPHSABS 2.8 02/03/2015 0527   MONOABS 0.4 02/03/2015 0527   EOSABS 0.1 02/03/2015 0527   BASOSABS 0.0 02/03/2015 0527    BMET    Component Value Date/Time   NA 150 (H) 04/01/2022 0440   NA 137 03/22/2021 0916   K 4.1 04/01/2022 0440   CL 118 (H) 04/01/2022 0440   CO2 28 04/01/2022 0440   GLUCOSE 365 (H) 04/01/2022 0440   BUN 37 (H) 04/01/2022 0440   BUN 11 03/22/2021 0916   CREATININE 1.14 (H) 04/01/2022 0440   CREATININE 0.74 11/11/2015 1921   CALCIUM 8.9 04/01/2022 0440   GFRNONAA 53 (L) 04/01/2022 0440   GFRNONAA >89 11/11/2015 1921   GFRAA 78 10/23/2020 1701   GFRAA >89 11/11/2015 1921    INR    Component Value Date/Time   INR 1.1 03/27/2022 0510     Intake/Output Summary (Last 24 hours) at 04/01/2022 0109 Last data filed at 04/01/2022 0400 Gross per 24 hour  Intake 464.21 ml  Output 725 ml  Net -260.79 ml      Assessment/Plan:  66 y.o. female is s/p  Aortobifemoral bypass using 14 x 7 bifurcated Dacron graft Bilateral femoral endarterectomy,  Left-sided profunda endarterectomy with bovine pericardial patch 11 Days Post-Op  -Vascular:  continues with + PT doppler signals -Cardiac:  hemodynamically stable not requiring pressor support -Pulmonary:  intubated.  RN reports she did well yesterday off of sedation.  Hopeful for extubation soon.  Weaning per CCM. -Neuro:  sedated currently.  RN reports she followed commands yesterday -Renal:  creatinine improved today at 1.14; mild hypernatremia 148-150 past 3 days is stable -GI:  abdomen continues to be distended but softer; +BM x 3 past 24 hrs -Incisions:  all incisions are healing -Heme/ID:  remains afebrile; check CBC tomorrow -General:  resting comfortably.  RN reports she did well yesterday off sedation.     Leontine Locket, PA-C [facility] [phone] 04/01/2022 6:52 AM   VASCULAR STAFF ADDENDUM: I have independently interviewed and examined the patient. I agree with the above.  Did well with pressure support yesterday, hoping to wean to extubation today or in the days to follow.  Appreciate critical care's assistance in this. Abdomen continues to feel soft.  Over 600 mL NG tube output,  gastric, we will continue TPN for the time being Excellent signals distally, following commands. Labs reviewed, hyponatremic, hyperglycemic, would appreciate adjustments to TPN accordingly Appreciate continued turns from nursing due to superficial sacral wound.  Overall, I think Leeanne has improved dramatically over the last 48 hours. Should she have any subjective chest pain, will need immediate cardiac cath.  Appreciate excellent care from the nursing, critical care team.  Cassandria Santee, MD [facility] Phone Number: [phone] 04/01/2022 8:05 AM

## 2022-04-01 NOTE — Progress Notes (Addendum)
NAME:  Gabrielle [name], MRN:  [id number], DOB:  22-May-1956, LOS: 79 ADMISSION DATE:  03/21/2022, CONSULTATION DATE:  03/22/22 REFERRING MD:  Unk Lightning, CHIEF COMPLAINT:  abd pain   History of Present Illness:  66 year old woman with hx of rectal cancer, DM2, HTN, HLD who presented for open aortobifemoral bypass by Dr. Unk Lightning for critical limb ischemia.  Postop course complicated by shock for which PCCM is consulted.  Patient underwent procedure with 14-7 bifurcated dacron graft, bilateral femoral endarterectomy late yesterday.  EBL 1.5L.  Currently c/o SOB and abdominal discomfort.  Pertinent  Medical History  DM2- A1c 9.3% HTN HLD Severe PVD  Significant Hospital Events: Including procedures, antibiotic start and stop dates in addition to other pertinent events   5/8  Aortobifemoral bypass using 14 x 7 bifurcated Dacron graft Bilateral femoral endarterectomy,  Left-sided profunda endarterectomy with bovine pericardial patch 5/9 PCCM consult for hypotension 5/10 worsening respiratory distress, intubated 5/11 diuresis 5/16 abdominal distention, increase bowel reg, attempts to wean from ventilator limited by low volumes 5/17 tachycardic, c/f STEMI, per cardiology no STEMI, PICC line placed for TPN 5/18 return of bowel function   Interim History / Subjective:  Overnight, patient continued to have bowel movements. Low dose Precedex had to be restarted because of agitation. Stopped precedex this am.   This morning, patient is awake and follows commands. More aware than previously, but intermittently sleepy. Weaning on PSV. NG tube to suction with green liquid in canister.  Objective   Blood pressure (!) 116/57, pulse 71, temperature 98.7 F (37.1 C), temperature source Axillary, resp. rate 20, height '4\' 9"'$  (1.448 m), weight 58 kg, SpO2 100 %. CVP:  [31 mmHg-56 mmHg] 56 mmHg  Vent Mode: PRVC FiO2 (%):  [40 %] 40 % Set Rate:  [20 bmp] 20 bmp Vt Set:  [300 mL] 300 mL PEEP:  [5 cmH20] 5  cmH20 Pressure Support:  [8 cmH20] 8 cmH20 Plateau Pressure:  [17 cmH20] 17 cmH20   Intake/Output Summary (Last 24 hours) at 04/01/2022 0733 Last data filed at 04/01/2022 0400 Gross per 24 hour  Intake 464.21 ml  Output 725 ml  Net -260.79 ml    Filed Weights   03/30/22 0500 03/31/22 0432 04/01/22 0500  Weight: 63.4 kg 58 kg 58 kg    Examination: General appearance: 66 y.o., female, awake, in mits, tracking and following commands Lungs: coarse bl CV: RRR, no murmur  Abdomen: Soft, less distended, tender to palpation, bowel sounds present Extremities: No peripheral edema, warm Skin: Normal turgor and texture; no rash Neuro: Follows commands to open eyes and wiggles toes on R, not L.   Labs/imaging reviewed:  Sodium 150 -> 153 Chloride 117 -> 126 Creatinine 1.14 -> 0.92 Glucose 70-91  Resolved Hospital Problem list   Postoperative shock  Assessment & Plan:  Acute hypoxemic respiratory failure  C/f aspiration pneumonia Patient weaning today on PSV with normal RR and moderately low tidal volumes.  Patient is more awake today, eye tracking and following commands, but still intermittently sleepy. Exam notable for coarse breath sounds. She has had multble bowel movements and abdomen is much softer. Will likely trial extubation today. If unable to extubate today, will likely start conversations with family regarding possible trach placement. - daily SBTs  - trial extubation today  Postoperative Ileus Patient continuing to have bowel movements. Abdomen is soft and less distended on exam. NG tube suctioning thin green liquid. Patient on TPN while ileus resolves.  - continue miralax, colace -  zofran for nausea - continue TPN   Elevated Troponin  New Heart Failure Reduced EF Suspected ICM Patient clinically appears dry, with CVP of 5 and elevated sodium. Likely has had fluid loss with emesis, ng suction, bowel movements, and insensible losses. Will give fluids with TPN per  pharmacy.  - cardiology consult, appreciate assistance - ASA 81, may be able to start GDMT over weekend - cardiac cath likely next week - monitor on telemetry  Hypernatremia Patient with elevated sodium in the setting of recent diuresis and fluid losses. Patient appears dry on exam. Free water deficit 2.5L Will add free water in TPN per pharmacy. Unable to give free water flushes given NG tube to suction. - TPN with volume per pharmacy  Acute Kidney Injury Creatinine improving after holding diuresis yesterday. Will continue to hold diuresis given dry exam and hypernatremia. - daily BMP - strict I/Os  Acute metabolic encephalopathy Had to restart precedex overnight due to agitation. Patient awake and following commands this morning, but alertness level appears to wax and wane. Likely has a component of delirium. Needs to be more awake for extubation trial. - stop precedex, prn fentanyl  PAD s/p Aortobifemoral bypass  HTN, HLD hx  Vascular surgery following. Surgical site clear, dry and intact, perfusion to lower extremities stable.  - continue statin, ASA  DM2 CBGs range from 70-137. Will likely adjust insulin regimen with TPN. - continue semlgee to 15U daily - continue 6U aspart q4hrs, SSI  Anemia of critical illness Stable  Best Practice (right click and "Reselect all SmartList Selections" daily)   Diet/type: NPO DVT prophylaxis: heparin ppx GI prophylaxis: N/A Lines: Central line Foley:  Yes, and it is still needed Code Status:  full code Last date of multidisciplinary goals of care discussion: Dr. Shearon Stalls updated husband at bedside 5/16  Excell Seltzer, MS4  Attending Attestation  She is more awake and alert today. On PS trial. Sedation held. Hopefully we can extubate. She is hypernatremic and CVP is 5. Increasing free water and talked with pharmacy about not fluid restricting her TPN. She is clinically dry. Ileus continues to improve. Talked with cardiology. Will need heart  cath next week.   The patient is critically ill due to respiratory failure.  Critical care was necessary to treat or prevent imminent or life-threatening deterioration.  Critical care was time spent personally by me on the following activities: development of treatment plan with patient and/or surrogate as well as nursing, discussions with consultants, evaluation of patient's response to treatment, examination of patient, obtaining history from patient or surrogate, ordering and performing treatments and interventions, ordering and review of laboratory studies, ordering and review of radiographic studies, pulse oximetry, re-evaluation of patient's condition and participation in multidisciplinary rounds.   Critical Care Time devoted to patient care services described in this note is 41 minutes. This time reflects time of care of this [place] . This critical care time does not reflect separately billable procedures or procedure time, teaching time or supervisory time of PA/NP/Med student/Med Resident etc but could involve care discussion time.       Spero Geralds [place] [facility] 04/01/2022 12:19 PM  Pager: see AMION  If no response to pager , please call critical care on call (see AMION) until 7pm After 7:00 pm call [place]

## 2022-04-01 NOTE — Progress Notes (Signed)
PHARMACY - TOTAL PARENTERAL NUTRITION CONSULT NOTE   Indication: Prolonged ileus  Patient Measurements: Height: '4\' 9"'$  (144.8 cm) Weight: 58 kg (127 lb 13.9 oz) IBW/kg (Calculated) : 38.6 TPN AdjBW (KG): 40.6 Body mass index is 27.67 kg/m. Usual Weight: ~118 lbs per chart review   Assessment:  66 yo W admitted with BL LE critical limb ischemia s/p aortofemoral bypass, BL femoral endarterectomy and Left-sided profunda endarterectomy with bovine pericardial patch 5/8. Patient with cardiogenic shock post-procedure now fluid overloaded. Patient with return of bowel function and multiple large stools 5/17, however, continues to have emesis and high NGT output. Imagining consistent with prolonged ileus. Patient received some trickle TF 5/11-5/13 and 5/16. While on TF (received up to 76g dextrose/24hr), patient required 41- 60 units of insulin. Patient is at risk for refeeding. Pharmacy consulted for TPN.   Of note, triglycerides are elevated at baseline (have been as high as 499 this admission while on propofol) but seem to have tolerated initial low dose lipids in TPN with no increase in TG past 12 hours of TPN. Discussed fluid status with CCM/Dr. Percival Spanish, pt appears dry with Na up to 153 and CVP 5. Plan to start D5 at 9m/hr and liberalize TPN fluids. **First set of labs drawn 5/19 a.m. were contaminated with TPN (this is why glucose so high)**  Glucose / Insulin: A1C 9.2. BG < 140 since TPN started, one as low as 70. TPN has 75 units in it currently. Noted D5 starting at 562mhr. Electrolytes: Na up to 153, K 3 (goal >/=4), Cl up to 126, CoCa 8.6, others wnl Renal: Scr 0.92, BUN 32 Hepatic: LFT/Tbili wnl, albumin 2.5, TG 225 (stable) Intake / Output; MIVF: UOP 1215 yday, NGT down to 150 yday but now up to 500 in past 10 hours, LBM 5/18. CVP 5. Starting D5 at 5095mr per CCM (ok with cards) as pt appears dry this morning. GI Imaging: 5/8 KUB: unremarkable 5/13 KUB: increased small bowel gas,  ileus vs obstruction 5/14 KUB: ileus vs SBO  5/15 KUB: Similar gaseous distension of small bowel likely resolving ileus  5/17 KUB: Similar small bowel and proximal colonic distension consistent with postoperative ileus  GI Surgeries / Procedures:  5/17 NGT placement   Central access: PICC 5/17  TPN start date: 5/18  Nutritional Goals: Goal TPN rate (non-concentrated) is 70 mL/hr, 57 g/L of 10% protein, 15% dextrose, 30g/L lipids (provides 95 g of protein, 252 g dextrose, 50 g lipids and 1750 kcals per day)  RD Assessment: Estimated Needs Total Energy Estimated Needs: 1750-1950 kcals Total Protein Estimated Needs: 95-110 g Total Fluid Estimated Needs: >/= 1.7 L  Current Nutrition:  Concentrated TPN at 25 mL/hr at 1800 with decreased lipids (provides 43 g protein, 144 g dextrose, 13g lipids, 794 kcal meeting ~45% of estimated needs)  Plan:  Increase TPN to goal rate of 70 mL/hr at 1800. Okay to give nonconcentrated TPN as this is only source of fluids at this time (d/w CCM and Hochrein) D5W currently at 20m18m - will d/c when TPN hangs at 1800. Potassium chloride 10mE67mml x 5 runs. Electrolytes in TPN: Decrease Na to 0 mEq/L, Increase K to 50 mEq/L, Mg to 3 mEq/L. Continue Ca 8 mEq/L and Phos 15 mmol/L. Change Cl:Ac to max acetate Add standard MVI and trace elements to TPN Increase insulin in TPN to 100 units (insulin amount increased overall but decreased the insulin to carb ratio with CBG <100 this morning) Continue resistant q4h SSI and adjust as  needed  Monitor TPN labs daily until stable at goal then on Mon/Thurs F/u triglyceride trend closely with increased lipids in TPN F/u fluid status - can go back to more concentrated TPN if needed  Sherlon Handing, PharmD, BCPS Please see amion for complete clinical pharmacist phone list 04/01/2022 10:28 AM

## 2022-04-02 DIAGNOSIS — E43 Unspecified severe protein-calorie malnutrition: Secondary | ICD-10-CM | POA: Diagnosis not present

## 2022-04-02 DIAGNOSIS — R570 Cardiogenic shock: Secondary | ICD-10-CM | POA: Diagnosis not present

## 2022-04-02 DIAGNOSIS — I739 Peripheral vascular disease, unspecified: Secondary | ICD-10-CM | POA: Diagnosis not present

## 2022-04-02 DIAGNOSIS — I5021 Acute systolic (congestive) heart failure: Secondary | ICD-10-CM

## 2022-04-02 DIAGNOSIS — K567 Ileus, unspecified: Secondary | ICD-10-CM

## 2022-04-02 DIAGNOSIS — K9189 Other postprocedural complications and disorders of digestive system: Secondary | ICD-10-CM

## 2022-04-02 DIAGNOSIS — J9601 Acute respiratory failure with hypoxia: Secondary | ICD-10-CM | POA: Diagnosis not present

## 2022-04-02 DIAGNOSIS — I7409 Other arterial embolism and thrombosis of abdominal aorta: Secondary | ICD-10-CM | POA: Diagnosis not present

## 2022-04-02 LAB — BASIC METABOLIC PANEL
Anion gap: 4 — ABNORMAL LOW (ref 5–15)
BUN: 33 mg/dL — ABNORMAL HIGH (ref 8–23)
CO2: 26 mmol/L (ref 22–32)
Calcium: 9.1 mg/dL (ref 8.9–10.3)
Chloride: 119 mmol/L — ABNORMAL HIGH (ref 98–111)
Creatinine, Ser: 0.98 mg/dL (ref 0.44–1.00)
GFR, Estimated: >60 mL/min (ref >60)
Glucose, Bld: 235 mg/dL — ABNORMAL HIGH (ref 70–99)
Potassium: 4.2 mmol/L (ref 3.5–5.1)
Sodium: 149 mmol/L — ABNORMAL HIGH (ref 135–145)

## 2022-04-02 LAB — PHOSPHORUS: Phosphorus: 3.2 mg/dL (ref 2.5–4.6)

## 2022-04-02 LAB — CBC
HCT: 29.1 % — ABNORMAL LOW (ref 36.0–46.0)
Hemoglobin: 8.8 g/dL — ABNORMAL LOW (ref 12.0–15.0)
MCH: 29 pg (ref 26.0–34.0)
MCHC: 30.2 g/dL (ref 30.0–36.0)
MCV: 96 fL (ref 80.0–100.0)
Platelets: 354 10*3/uL (ref 150–400)
RBC: 3.03 MIL/uL — ABNORMAL LOW (ref 3.87–5.11)
RDW: 14.4 % (ref 11.5–15.5)
WBC: 16.6 10*3/uL — ABNORMAL HIGH (ref 4.0–10.5)
nRBC: 0 % (ref 0.0–0.2)

## 2022-04-02 LAB — APTT: aPTT: 29 seconds (ref 24–36)

## 2022-04-02 LAB — GLUCOSE, CAPILLARY
Glucose-Capillary: 219 mg/dL — ABNORMAL HIGH (ref 70–99)
Glucose-Capillary: 219 mg/dL — ABNORMAL HIGH (ref 70–99)
Glucose-Capillary: 161 mg/dL — ABNORMAL HIGH (ref 70–99)
Glucose-Capillary: 212 mg/dL — ABNORMAL HIGH (ref 70–99)
Glucose-Capillary: 208 mg/dL — ABNORMAL HIGH (ref 70–99)

## 2022-04-02 LAB — TRIGLYCERIDES: Triglycerides: 215 mg/dL — ABNORMAL HIGH (ref <150)

## 2022-04-02 LAB — MAGNESIUM: Magnesium: 2.2 mg/dL (ref 1.7–2.4)

## 2022-04-02 MED ORDER — CLONAZEPAM 0.5 MG PO TABS: 0.2500 mg | ORAL_TABLET | Freq: Two times a day (BID) | ORAL | Status: DC

## 2022-04-02 MED ORDER — CLONAZEPAM 0.25 MG PO TBDP
0.2500 mg | ORAL_TABLET | Freq: Two times a day (BID) | ORAL | Status: AC
  Administered 2022-04-02 – 2022-04-03 (×3): 0.25 mg via ORAL
  Filled 2022-04-02 (×3): qty 1

## 2022-04-02 MED ORDER — TRAVASOL 10 % IV SOLN
INTRAVENOUS | Status: AC
  Filled 2022-04-02: qty 974.4

## 2022-04-02 NOTE — Evaluation (Signed)
Occupational Therapy Evaluation Patient Details Name: Gabrielle [name] MRN: [id number] DOB: [birth year] Today's Date: 04/02/2022   History of Present Illness Patient is a 66 y/o female who presents on 5/8 for open aortobifemoral bypass and Bilateral femoral endarterectomy complicated by MI, heart failure and shock, intubated 5/10-5/20. PMH includes rectal cancer, DM2, HTN.   Clinical Impression   Unsure of PLOF as pt unreliable historian, and family not in room at end of session - next session establish home set up and PLOF with family. Assume that Pt was independent/mod I. Today pt presents with lethargy, impaired cognition, decreased balance and strength, pain. She is overall mod to max A for all aspects of ADL at bed level. She does attempt to mouth responses to questions - but does not produce any vocal quality able to be heard. She is very easily distracted, and follows commands <50% of the time. Total A +2 for bed level peri care, including linens change. OT will continue to follow acutely. At this time recommending SNF post-acute to maximize safety and independence in ADL and functional transfers.      Recommendations for follow up therapy are one component of a multi-disciplinary discharge planning process, led by the attending physician.  Recommendations may be updated based on patient status, additional functional criteria and insurance authorization.   Follow Up Recommendations  Skilled nursing-short term rehab (<3 hours/day)    Assistance Recommended at Discharge Frequent or constant Supervision/Assistance  Patient can return home with the following Two people to help with walking and/or transfers;Two people to help with bathing/dressing/bathroom;Direct supervision/assist for medications management;Direct supervision/assist for financial management;Assist for transportation;Help with stairs or ramp for entrance    Functional Status Assessment  Patient has had a recent decline in  their functional status and demonstrates the ability to make significant improvements in function in a reasonable and predictable amount of time.  Equipment Recommendations  BSC/3in1;Other (comment) (defer to next level of care)    Recommendations for Other Services PT consult;Speech consult     Precautions / Restrictions Precautions Precautions: Fall;Other (comment) Precaution Comments: NG tube to suction, A-line Restrictions Weight Bearing Restrictions: No      Mobility Bed Mobility Overal bed mobility: Needs Assistance Bed Mobility: Rolling Rolling: Total assist, +2 for physical assistance         General bed mobility comments: Rolling to left/right with total A of 2-3, no initiation of movement to assist.    Transfers                   General transfer comment: NT this session      Balance                                           ADL either performed or assessed with clinical judgement   ADL Overall ADL's : Needs assistance/impaired Eating/Feeding: NPO   Grooming: Maximal assistance;Bed level   Upper Body Bathing: Maximal assistance;Bed level   Lower Body Bathing: Total assistance;Bed level   Upper Body Dressing : Total assistance   Lower Body Dressing: Total assistance   Toilet Transfer: Total assistance   Toileting- Clothing Manipulation and Hygiene: Total assistance;Bed level;+2 for physical assistance;+2 for safety/equipment         General ADL Comments: currently max to total A for ADL due to impaired cognition, pain, general deconditioning/generalized weakness     Vision Ability to  See in Adequate Light: 1 Impaired Vision Assessment?: Vision impaired- to be further tested in functional context Additional Comments: continue to assess, cognition impacting ability to participate     Perception     Praxis      Pertinent Vitals/Pain Pain Assessment Pain Assessment: Faces Faces Pain Scale: Hurts even more Pain  Location: generalized with movement Pain Descriptors / Indicators: Grimacing, Guarding Pain Intervention(s): Limited activity within patient's tolerance, Monitored during session, Repositioned     Hand Dominance Right   Extremity/Trunk Assessment Upper Extremity Assessment Upper Extremity Assessment: Generalized weakness;RUE deficits/detail;LUE deficits/detail RUE Deficits / Details: moves against gravity, grossly 4-/5 LUE Deficits / Details: grossly 3+/5; grasp 3+/5, shoulder FF lacks initiation, generally weaker and less coordintaed than Right LUE Coordination: decreased gross motor   Lower Extremity Assessment Lower Extremity Assessment: Defer to PT evaluation   Cervical / Trunk Assessment Cervical / Trunk Assessment: Other exceptions Cervical / Trunk Exceptions: large incision sited mid-abdomen, along creases where legs meet body   Communication Communication Communication: Expressive difficulties (moutuhing words only despite encouragement)   Cognition Arousal/Alertness: Awake/alert, Lethargic Behavior During Therapy: Flat affect Overall Cognitive Status: Difficult to assess Area of Impairment: Attention, Following commands, Safety/judgement, Problem solving, Orientation                 Orientation Level: Disoriented to, Situation, Time, Place Current Attention Level: Focused   Following Commands: Follows one step commands inconsistently, Follows one step commands with increased time Safety/Judgement: Decreased awareness of safety, Decreased awareness of deficits   Problem Solving: Slow processing, Decreased initiation, Requires verbal cues, Requires tactile cues General Comments: Requires repetition to follow simple 1 step commands and increased time inconsistently. Able to squeeze hands and wiggle feet. Poor attention, a few seconds at most. Nods yes to her name but not to place. No verbalizations throughout session.     General Comments  Family stepped out of  room during session. Pt found sitting in stool and assisted RN with cleaning up patient. VSS during session. Noted to have sore on bottom with some eschar and red patches, sensitive to touch.    Exercises     Shoulder Instructions      Home Living Family/patient expects to be discharged to:: Private residence Living Arrangements: Spouse/significant other Available Help at Discharge: Family                             Additional Comments: family left room during peri care, and Pt unreliable historian - will need to get set up      Prior Functioning/Environment Prior Level of Function : Independent/Modified Independent             Mobility Comments: LIkely independent ADLs Comments: likely independent        OT Problem List: Decreased strength;Decreased range of motion;Decreased activity tolerance;Impaired balance (sitting and/or standing);Decreased cognition;Decreased safety awareness;Decreased knowledge of use of DME or AE;Cardiopulmonary status limiting activity;Pain      OT Treatment/Interventions: Self-care/ADL training;Energy conservation;DME and/or AE instruction;Therapeutic activities;Cognitive remediation/compensation;Patient/family education;Balance training    OT Goals(Current goals can be found in the care plan section) Acute Rehab OT Goals Patient Stated Goal: none stated OT Goal Formulation: Patient unable to participate in goal setting Time For Goal Achievement: 04/16/22 Potential to Achieve Goals: Fair ADL Goals Pt Will Perform Grooming: with set-up;sitting Pt Will Perform Upper Body Dressing: sitting;with mod assist Pt Will Perform Lower Body Dressing: with max assist;sit to/from stand Pt Will  Transfer to Toilet: with max assist;stand pivot transfer;bedside commode Pt Will Perform Toileting - Clothing Manipulation and hygiene: with max assist;sit to/from stand Pt/caregiver will Perform Home Exercise Program: Both right and left upper  extremity;With theraband;With written HEP provided;With minimal assist Additional ADL Goal #1: Pt will perform bed mobility to come EOB as precursor to participation in ADL at mod A  OT Frequency: Min 2X/week    Co-evaluation PT/OT/SLP Co-Evaluation/Treatment: Yes Reason for Co-Treatment: Complexity of the patient's impairments (multi-system involvement);Necessary to address cognition/behavior during functional activity;For patient/therapist safety;To address functional/ADL transfers PT goals addressed during session: Balance;Strengthening/ROM OT goals addressed during session: ADL's and self-care;Strengthening/ROM      AM-PAC OT "6 Clicks" Daily Activity     Outcome Measure Help from another person eating meals?: Total (NPO) Help from another person taking care of personal grooming?: A Lot Help from another person toileting, which includes using toliet, bedpan, or urinal?: Total Help from another person bathing (including washing, rinsing, drying)?: Total Help from another person to put on and taking off regular upper body clothing?: Total Help from another person to put on and taking off regular lower body clothing?: Total 6 Click Score: 7   End of Session Equipment Utilized During Treatment: Oxygen (4L) Nurse Communication: Mobility status;Need for lift equipment;Precautions  Activity Tolerance: Patient tolerated treatment well Patient left: in bed;with call bell/phone within reach;with bed alarm set;with nursing/sitter in room  OT Visit Diagnosis: Unsteadiness on feet (R26.81);Other abnormalities of gait and mobility (R26.89);Muscle weakness (generalized) (M62.81);Other symptoms and signs involving cognitive function Pain - part of body:  (abdomen)                Time: 9030-0923 OT Time Calculation (min): 26 min Charges:  OT General Charges $OT Visit: 1 Visit OT Evaluation $OT Eval Moderate Complexity: [place] OTR/L Acute Rehabilitation Services Pager:  [phone] Office: [place] 04/02/2022, 4:36 PM

## 2022-04-02 NOTE — Evaluation (Signed)
Physical Therapy Evaluation Patient Details Name: Gabrielle [name] MRN: [id number] DOB: Jan 06, 1956 Today's Date: 04/02/2022  History of Present Illness  Patient is a 66 y/o female who presents on 5/8 for open aortobifemoral bypass and Bilateral femoral endarterectomy complicated by MI, heart failure and shock, intubated 5/10-5/20. PMH includes rectal cancer, DM2, HTN.  Clinical Impression  Patient presents with lethargy, impaired attention, generalized weakness/deconditioning, difficulty following commands, slow processing and impaired mobility s/p above. Not able to obtain PLOF as pt nonverbal during session and family stepped out of room for evaluation, however likely independent. Today, pt requires total A of 2 for rolling in bed for pericare, follows a few 1 step commands inconsistently with repetition and increased time. Grimacing in pain with movement through LEs and trunk. Noted to have pressure sore on bottom, wound care following per RN. Will benefit from SNF to maximize independence, mobility and ease burden of care prior to return home. Will continue to follow acutely to progress EOB/OOB mobility and will plan to chat with family next session to obtain PLOF/home setup.      Recommendations for follow up therapy are one component of a multi-disciplinary discharge planning process, led by the attending physician.  Recommendations may be updated based on patient status, additional functional criteria and insurance authorization.  Follow Up Recommendations Skilled nursing-short term rehab (<3 hours/day)    Assistance Recommended at Discharge Frequent or constant Supervision/Assistance  Patient can return home with the following  Two people to help with walking and/or transfers;Two people to help with bathing/dressing/bathroom;Assistance with cooking/housework;Assist for transportation;Direct supervision/assist for financial management;Help with stairs or ramp for entrance;Direct  supervision/assist for medications management    Equipment Recommendations Other (comment) (TBA)  Recommendations for Other Services       Functional Status Assessment Patient has had a recent decline in their functional status and demonstrates the ability to make significant improvements in function in a reasonable and predictable amount of time.     Precautions / Restrictions Precautions Precautions: Fall;Other (comment) Precaution Comments: NG tube to suction, A-line Restrictions Weight Bearing Restrictions: No      Mobility  Bed Mobility Overal bed mobility: Needs Assistance Bed Mobility: Rolling Rolling: Total assist, +2 for physical assistance         General bed mobility comments: Rolling to left/right with total A of 2-3, no initiation of movement to assist.    Transfers                        Ambulation/Gait                  Stairs            Wheelchair Mobility    Modified Rankin (Stroke Patients Only)       Balance                                             Pertinent Vitals/Pain Pain Assessment Pain Assessment: Faces Faces Pain Scale: Hurts even more Pain Location: generalized with movement Pain Descriptors / Indicators: Grimacing, Guarding Pain Intervention(s): Monitored during session, Limited activity within patient's tolerance, Repositioned    Home Living Family/patient expects to be discharged to:: Private residence Living Arrangements: Spouse/significant other Available Help at Discharge: Family  Prior Function Prior Level of Function : Independent/Modified Independent             Mobility Comments: LIkely independent       Hand Dominance        Extremity/Trunk Assessment   Upper Extremity Assessment Upper Extremity Assessment:  (Weak grip bilaterally. Able to lift RUE against gravity, assist needed with LUE into shoulder flexion)    Lower Extremity  Assessment Lower Extremity Assessment: Generalized weakness;Difficult to assess due to impaired cognition (Limited movement through ankles, knees actively. Pain with PROM at knees/hips)       Communication      Cognition Arousal/Alertness: Awake/alert, Lethargic Behavior During Therapy: Flat affect Overall Cognitive Status: Difficult to assess Area of Impairment: Attention, Following commands, Safety/judgement, Problem solving, Orientation                 Orientation Level: Disoriented to, Situation, Time, Place Current Attention Level: Focused   Following Commands: Follows one step commands inconsistently, Follows one step commands with increased time Safety/Judgement: Decreased awareness of safety, Decreased awareness of deficits   Problem Solving: Slow processing, Decreased initiation, Requires verbal cues, Requires tactile cues General Comments: Requires repetition to follow simple 1 step commands and increased time inconsistently. Able to squeeze hands and wiggle feet. Poor attention, a few seconds at most. Nods yes to her name but not to place. No verbalizations throughout session.        General Comments General comments (skin integrity, edema, etc.): Family stepped out of room during session. Pt found sitting in stool and assisted RN with cleaning up patient. VSS during session. Noted to have sore on bottom with some eschar and red patches, sensitive to touch.    Exercises     Assessment/Plan    PT Assessment Patient needs continued PT services  PT Problem List Decreased strength;Decreased range of motion;Decreased mobility;Decreased safety awareness;Impaired tone;Decreased activity tolerance;Decreased balance;Decreased cognition;Cardiopulmonary status limiting activity;Decreased skin integrity;Pain       PT Treatment Interventions Therapeutic exercise;Patient/family education;Therapeutic activities;Wheelchair mobility training;DME instruction;Balance training;Gait  training;Functional mobility training;Cognitive remediation;Neuromuscular re-education;Manual techniques    PT Goals (Current goals can be found in the Care Plan section)  Acute Rehab PT Goals Patient Stated Goal: unable to state PT Goal Formulation: Patient unable to participate in goal setting Time For Goal Achievement: 04/16/22 Potential to Achieve Goals: Fair    Frequency Min 2X/week     Co-evaluation PT/OT/SLP Co-Evaluation/Treatment: Yes Reason for Co-Treatment: Complexity of the patient's impairments (multi-system involvement);Necessary to address cognition/behavior during functional activity;For patient/therapist safety;To address functional/ADL transfers PT goals addressed during session: Strengthening/ROM;Mobility/safety with mobility         AM-PAC PT "6 Clicks" Mobility  Outcome Measure Help needed turning from your back to your side while in a flat bed without using bedrails?: Total Help needed moving from lying on your back to sitting on the side of a flat bed without using bedrails?: Total Help needed moving to and from a bed to a chair (including a wheelchair)?: Total Help needed standing up from a chair using your arms (e.g., wheelchair or bedside chair)?: Total Help needed to walk in hospital room?: Total Help needed climbing 3-5 steps with a railing? : Total 6 Click Score: 6    End of Session Equipment Utilized During Treatment: Oxygen Activity Tolerance: Patient limited by fatigue;Patient limited by lethargy Patient left: in bed;with call bell/phone within reach;with nursing/sitter in room Nurse Communication: Mobility status;Need for lift equipment PT Visit Diagnosis: Muscle weakness (generalized) (M62.81)  Time: 5784-6962 PT Time Calculation (min) (ACUTE ONLY): 29 min   Charges:   PT Evaluation $PT Eval Moderate Complexity: 1 Mod          Marisa Severin, PT, DPT Acute Rehabilitation Services Secure chat preferred Office  [place] 04/02/2022, 3:28 PM

## 2022-04-02 NOTE — Progress Notes (Signed)
[facility] [place] PROGRESS NOTE  ASSESSMENT / PLAN: CODA FILLER is a 66 y.o. female status post aortobifemoral bypass 12/21/49, complicated by postoperative myocardial infarction and heart failure.  She has required prolonged intubation and mechanical ventilation.  Globally, she appears to be doing better.  Plan trial of extubation today.  If she fails extubation, we will need to consider tracheostomy.  We will await advancing enteral nutrition until we are able to liberate her from the endotracheal tube.  Otherwise, continue current care plan.  Appreciate cardiology and critical care service assistance.  I will update her husband by telephone later today.  SUBJECTIVE: No events overnight.  Resting comfortably.  OBJECTIVE: BP (!) 123/54   Pulse 71   Temp 98.1 F (36.7 C) (Axillary)   Resp 20   Ht '4\' 9"'$  (1.448 m)   Wt 57.8 kg   SpO2 100%   BMI 27.57 kg/m   Intake/Output Summary (Last 24 hours) at 04/02/2022 7001 Last data filed at 04/02/2022 0600 Gross per 24 hour  Intake 1944.47 ml  Output 1440 ml  Net 504.47 ml    Constitutional: Well-appearing woman in no acute distress.  Intubated and sedated. CNS: GCS 11 T. Cardiac: Regular rate and rhythm. Pulmonary: Unlabored on vent Abdomen: Soft abdomen.  Mild distention.  Nontender.  Midline incision healing well. Vascular: Groin incisions healing well.  Good Doppler flow in the dorsalis pedis arteries bilaterally.     Latest Ref Rng & Units 04/02/2022    2:17 AM 03/31/2022    4:25 AM 03/30/2022    3:07 AM  CBC  WBC 4.0 - 10.5 K/uL 16.6   14.9   15.8    Hemoglobin 12.0 - 15.0 g/dL 8.8   8.3   10.9    Hematocrit 36.0 - 46.0 % 29.1   27.4   34.6    Platelets 150 - 400 K/uL 354   272   342          Latest Ref Rng & Units 04/02/2022    2:17 AM 04/01/2022    7:51 AM 04/01/2022    4:40 AM  CMP  Glucose 70 - 99 mg/dL 235   70   365    BUN 8 - 23 mg/dL 33   32   37    Creatinine 0.44 - 1.00 mg/dL  0.98   0.92   1.14    Sodium 135 - 145 mmol/L 149   153   150    Potassium 3.5 - 5.1 mmol/L 4.2   3.0   4.1    Chloride 98 - 111 mmol/L 119   126   118    CO2 22 - 32 mmol/L '26   23   28    '$ Calcium 8.9 - 10.3 mg/dL 9.1   7.4   8.9    Total Protein 6.5 - 8.1 g/dL   6.3    Total Bilirubin 0.3 - 1.2 mg/dL   1.0    Alkaline Phos 38 - 126 U/L   112    AST 15 - 41 U/L   28    ALT 0 - 44 U/L   19      Estimated Creatinine Clearance: 41.8 mL/min (by C-G formula based on SCr of 0.98 mg/dL).  Gabrielle [name]. Stanford Breed, MD [facility] Phone Number: [phone] 04/02/2022 6:09 AM

## 2022-04-02 NOTE — Procedures (Signed)
Extubation Procedure Note  Patient Details:   Name: Gabrielle [name] DOB: 08/02/1956 MRN: [id number]   Airway Documentation:    Vent end date: 04/02/22 Vent end time: 0850   Evaluation  O2 sats: stable throughout Complications: No apparent complications Patient did tolerate procedure well. Bilateral Breath Sounds: Clear   Patient extubated per MD order & placed on 4L [place]. Patient is able to cough, but weak at this time. Patient will not speak so we can hear her voice when we ask her questions. Patient is in no distress at this time.  Kathie Dike 04/02/2022, 8:59 AM

## 2022-04-02 NOTE — Progress Notes (Signed)
NAME:  Gabrielle [name], MRN:  [id number], DOB:  1956/02/02, LOS: 12 ADMISSION DATE:  03/21/2022, CONSULTATION DATE:  03/22/22 REFERRING MD:  Unk Lightning, CHIEF COMPLAINT:  abd pain   History of Present Illness:  66 year old woman with hx of rectal cancer, DM2, HTN, HLD who presented for open aortobifemoral bypass by Dr. Unk Lightning for critical limb ischemia.  Postop course complicated by shock for which PCCM is consulted.  Patient underwent procedure with 14-7 bifurcated dacron graft, bilateral femoral endarterectomy late yesterday.  EBL 1.5L.  Currently c/o SOB and abdominal discomfort.  Pertinent  Medical History  DM2- A1c 9.3% HTN HLD Severe PVD  Significant Hospital Events: Including procedures, antibiotic start and stop dates in addition to other pertinent events   5/8  Aortobifemoral bypass using 14 x 7 bifurcated Dacron graft Bilateral femoral endarterectomy,  Left-sided profunda endarterectomy with bovine pericardial patch 5/9 PCCM consult for hypotension 5/10 worsening respiratory distress, intubated 5/11 diuresis 5/16 abdominal distention, increase bowel reg, attempts to wean from ventilator limited by low volumes 5/17 tachycardic, c/f STEMI, per cardiology no STEMI, PICC line placed for TPN 5/18 return of bowel function   Interim History / Subjective:  No overnight issues.  On CPAP trial this morning and sedation is held.  BM overnight.  Objective   Blood pressure (!) 150/59, pulse 87, temperature 98.6 F (37 C), temperature source Oral, resp. rate 19, height '4\' 9"'$  (1.448 m), weight 57.8 kg, SpO2 100 %.    Vent Mode: PSV;CPAP FiO2 (%):  [40 %] 40 % Set Rate:  [20 bmp] 20 bmp Vt Set:  [300 mL] 300 mL PEEP:  [5 cmH20] 5 cmH20 Pressure Support:  [5 cmH20-10 cmH20] 5 cmH20 Plateau Pressure:  [15 cmH20-16 cmH20] 16 cmH20   Intake/Output Summary (Last 24 hours) at 04/02/2022 0818 Last data filed at 04/02/2022 0800 Gross per 24 hour  Intake 1989.69 ml  Output 1515 ml  Net  474.69 ml   Filed Weights   03/31/22 0432 04/01/22 0500 04/02/22 0500  Weight: 58 kg 58 kg 57.8 kg    Examination: Gen:      Intubated, sedated, acutely ill appearing HEENT:  ETT to vent Lungs:    sounds of mechanical ventilation auscultated no wheezes CV:         tachycardic, regular Abd:      Midline surgical incision well healed, abdomen soft Ext:    No edema Skin:      Warm and dry; no rashes Neuro:   sedated, RASS 0, moves all 4 extremities   Labs/imaging reviewed:  Sodium 149 Chloride 119 Creatinine 0.98 CBGs on the high side over 200  Resolved Hospital Problem list   Postoperative shock  Assessment & Plan:  Acute hypoxemic respiratory failure - daily SBTs. Discussed with vascular surgery on 5/19. Will attempt extubation today hopefully. If she can't get extubate this weekend will need trach most likely Monday.  Postoperative Ileus Patient continuing to have bowel movements. Abdomen is soft and less distended on exam. NG tube suctioning thin green liquid. Patient on TPN while ileus resolves.  - continue miralax, colace - zofran for nausea - continue TPN   Elevated Troponin  New Heart Failure Reduced EF Suspected ICM Patient clinically appears dry, with CVP of 5 and elevated sodium. Likely has had fluid loss with emesis, ng suction, bowel movements, and insensible losses. Will give fluids with TPN per pharmacy.  - cardiology consult, appreciate assistance - ASA 81, may be able to start GDMT over  weekend - cardiac cath likely next week - monitor on telemetry  Hypernatremia Patient with elevated sodium in the setting of recent diuresis and fluid losses. Patient appears dry on exam. Free water deficit 2.5L Will add free water in TPN per pharmacy. Unable to give free water flushes given NG tube to suction. - TPN with volume per pharmacy  Acute Kidney Injury - stabilizing Cr - daily BMP - strict I/Os  Acute metabolic encephalopathy Sedation for mechanical  ventilation On precedex, scheduled oxycodone enterally Hopefully today with better pain control we can achieve this  PAD s/p Aortobifemoral bypass  HTN, HLD hx  Vascular surgery following. Surgical site clear, dry and intact, perfusion to lower extremities stable.  - continue statin, ASA  DM2 CBGs range from 70-137. Will likely adjust insulin regimen with TPN. - continue aspart q4hrs, SSI - will have pharmacy adjust insulin with TPN since she has been hyperglycemic today  Anemia of critical illness Stable  Best Practice (right click and "Reselect all SmartList Selections" daily)   Diet/type: NPO DVT prophylaxis: heparin ppx GI prophylaxis: N/A Lines: Central line Foley:  Yes, and it is still needed Code Status:  full code Last date of multidisciplinary goals of care discussion: per primary  Critical Care Time:  The patient is critically ill due to respirtory failure.  Critical care was necessary to treat or prevent imminent or life-threatening deterioration.  Critical care was time spent personally by me on the following activities: development of treatment plan with patient and/or surrogate as well as nursing, discussions with consultants, evaluation of patient's response to treatment, examination of patient, obtaining history from patient or surrogate, ordering and performing treatments and interventions, ordering and review of laboratory studies, ordering and review of radiographic studies, pulse oximetry, re-evaluation of patient's condition and participation in multidisciplinary rounds.   Critical Care Time devoted to patient care services described in this note is 35 minutes. This time reflects time of care of this [place] . This critical care time does not reflect separately billable procedures or procedure time, teaching time or supervisory time of PA/NP/Med student/Med Resident etc but could involve care discussion time.       Spero [place] [facility] 04/02/2022 8:18 AM  Pager: see AMION  If no response to pager , please call critical care on call (see AMION) until 7pm After 7:00 pm call El[place]

## 2022-04-02 NOTE — Progress Notes (Signed)
OT Cancellation Note  Patient Details Name: Gabrielle [name] MRN: [id number] DOB: 1956/06/22   Cancelled Treatment:    Reason Eval/Treat Not Completed: Patient not medically ready. See MD note of planned extubation today. OT will hold re-evaluation until extubation vs potential trach. OT will continue to follow acutely.  [place] 04/02/2022, 8:24 AM  Jesse Sans OTR/L Acute Rehabilitation Services Pager: [phone] Office: [phone]

## 2022-04-02 NOTE — Progress Notes (Signed)
Progress Note  Patient Name: Gabrielle [name] Date of Encounter: 04/02/2022  [facility] Cardiologist: Janina Mayo, MD   Subjective   Still intubated.  Awake, appears slightly groggy, but following commands. Off all pressors.  Oxygenating well.  Seems to be tolerating CPAP trial well.  Inpatient Medications    Scheduled Meds:  artificial tears  1 application. Both Eyes Q8H   aspirin  81 mg Per Tube Daily   bacitracin   Topical TID   chlorhexidine gluconate (MEDLINE KIT)  15 mL Mouth Rinse BID   Chlorhexidine Gluconate Cloth  6 each Topical Daily   clonazePAM  1 mg Per Tube BID   docusate  100 mg Per Tube BID   heparin injection (subcutaneous)  5,000 Units Subcutaneous Q8H   insulin aspart  0-20 Units Subcutaneous Q4H   leptospermum manuka honey  1 application. Topical Daily   mouth rinse  15 mL Mouth Rinse 10 times per day   oxyCODONE  5 mg Per Tube Q6H   pantoprazole (PROTONIX) IV  40 mg Intravenous QHS   polyethylene glycol  17 g Per Tube Daily   rosuvastatin  40 mg Per Tube Daily   sodium chloride flush  10-40 mL Intracatheter Q12H   sodium chloride flush  10-40 mL Intracatheter Q12H   Continuous Infusions:  sodium chloride     sodium chloride 10 mL/hr at 03/30/22 2000   dexmedetomidine (PRECEDEX) IV infusion Stopped (04/02/22 0548)   magnesium sulfate bolus IVPB     TPN ADULT (ION) 70 mL/hr at 04/02/22 0800   TPN ADULT (ION)     PRN Meds: Place/Maintain arterial line **AND** sodium chloride, sodium chloride, acetaminophen (TYLENOL) oral liquid 160 mg/5 mL, bisacodyl, fentaNYL (SUBLIMAZE) injection, fentaNYL (SUBLIMAZE) injection, guaiFENesin-dextromethorphan, hydrALAZINE, labetalol, lip balm, magnesium sulfate bolus IVPB, ondansetron, phenol, sodium chloride flush   Vital Signs    Vitals:   04/02/22 0600 04/02/22 0700 04/02/22 0800 04/02/22 0807  BP: (!) 123/54 (!) 125/52 (!) 150/59   Pulse: 71 82 87   Resp: 20 20 19    Temp:   98.6 F (37 C)    TempSrc:   Oral   SpO2: 100% 100% 100% 100%  Weight:      Height:        Intake/Output Summary (Last 24 hours) at 04/02/2022 0824 Last data filed at 04/02/2022 0800 Gross per 24 hour  Intake 1989.69 ml  Output 1515 ml  Net 474.69 ml      04/02/2022    5:00 AM 04/01/2022    5:00 AM 03/31/2022    4:32 AM  Last 3 Weights  Weight (lbs) 127 lb 6.8 oz 127 lb 13.9 oz 127 lb 13.9 oz  Weight (kg) 57.8 kg 58 kg 58 kg      Telemetry    Normal sinus rhythm- Personally Reviewed  ECG    Normal sinus rhythm, incomplete right bundle branch block, prominent anterior T wave inversion, borderline prolonged QTc 476 ms on 03/31/2022- Personally Reviewed  Physical Exam  Awake, follows commands appears a little groggy GEN: No acute distress.   Neck: No JVD Cardiac: RRR, no murmurs, rubs, or gallops.  Warm extremities Respiratory: Clear to auscultation bilaterally. GI: Soft, nontender, non-distended  MS: No edema; No deformity. Neuro:  Nonfocal  Psych: Normal affect   Labs    High Sensitivity Troponin:   Recent Labs  Lab 03/23/22 1138 03/24/22 0843 03/25/22 1037 03/30/22 0057 03/30/22 0307  TROPONINIHS 3,592* 5,007* 2,820* 347* 316*  Chemistry Recent Labs  Lab 03/31/22 0735 04/01/22 0440 04/01/22 0751 04/02/22 0217  NA 148* 150* 153* 149*  K 3.8 4.1 3.0* 4.2  CL 117* 118* 126* 119*  CO2 26 28 23 26   GLUCOSE 129* 365* 70 235*  BUN 38* 37* 32* 33*  CREATININE 1.27* 1.14* 0.92 0.98  CALCIUM 8.6* 8.9 7.4* 9.1  MG 2.5* 2.6* 2.2 2.2  PROT 6.2* 6.3*  --   --   ALBUMIN 2.7* 2.5*  --   --   AST 33 28  --   --   ALT 22 19  --   --   ALKPHOS 126 112  --   --   BILITOT 0.9 1.0  --   --   GFRNONAA 47* 53* >60 >60  ANIONGAP 5 4* 4* 4*    Lipids  Recent Labs  Lab 04/02/22 0217  TRIG 215*    Hematology Recent Labs  Lab 03/30/22 0307 03/31/22 0425 04/02/22 0217  WBC 15.8* 14.9* 16.6*  RBC 3.70* 2.87* 3.03*  HGB 10.9* 8.3* 8.8*  HCT 34.6* 27.4* 29.1*  MCV 93.5  95.5 96.0  MCH 29.5 28.9 29.0  MCHC 31.5 30.3 30.2  RDW 14.8 14.3 14.4  PLT 342 272 354   Thyroid No results for input(s): TSH, FREET4 in the last 168 hours.  BNP Recent Labs  Lab 03/30/22 0307  BNP 559.7*    DDimer No results for input(s): DDIMER in the last 168 hours.   Radiology    No results found.  Cardiac Studies  Echocardiogram 03/23/2022   1. Left ventricular ejection fraction, by estimation, is 20 to 25%. The left ventricle has severely decreased function. The left ventricle demonstrates global hypokinesis. Left ventricular diastolic parameters are consistent with Grade II diastolic  dysfunction (pseudonormalization). Elevated left atrial pressure.   2. Right ventricular systolic function was not well visualized. The right ventricular size is normal.   3. Mild mitral valve regurgitation.   4. The aortic valve is tricuspid. Aortic valve regurgitation is not  visualized. Aortic valve sclerosis is present, with no evidence of aortic valve stenosis.   5. The inferior vena cava is dilated in size with <50% respiratory variability, suggesting right atrial pressure of 15 mmHg.   Comparison(s): Compared to prior TTE on 03/22/22, the LVEF has dropped  significantly to 20% from 55-60%.   Patient Profile     66 y.o. female with PAD presenting with critical limb ischemia, poorly controlled diabetes mellitus, hyperlipidemia, hypertension developing acute systolic heart failure and cardiogenic shock roughly 48 hours following aortobifemoral bypass surgery and bilateral femoral enterectomy, left-sided profunda endarterectomy, performed 03/21/2022.  Echocardiogram showed acute reduction in LVEF from 55 to 60% to 20-25% from 1 day to the next 05/09 - 05/10.  Assessment & Plan    Cardiogenic shock: No longer on pressors.  Differential diagnosis includes severe Takotsubo syndrome versus global left ventricular ischemia due to extensive CAD and plan cardiac catheterization, probably next  week.  Cardiac troponin did increase to 5000 but this is a minor increase, disproportional to the extent of left ventricular dysfunction. CHF: General impression is that she is hypovolemic with hypernatremia and low CVP yesterday, today CVP = 9.  Oxygenating well.  Consider repeating a limited echo early next week prior to catheterization AKI/Hypernatremia: Renal parameters are back to normal, remains mildly hypernatremic.  Continue free water, added to TPN. Acute respiratory failure: Plan for extubation later today if she passes CPAP trial.  If this is unsuccessful potential tracheostomy  on Monday. CRITICAL CARE Performed by: Dani Gobble Budd [place]   Total critical care time: 35 minutes  Critical care time was exclusive of separately billable procedures and treating other patients.  Critical care was necessary to treat or prevent imminent or life-threatening deterioration.  Critical care was time spent personally by me on the following activities: development of treatment plan with patient and/or surrogate as well as nursing, discussions with consultants, evaluation of patient's response to treatment, examination of patient, obtaining history from patient or surrogate, ordering and performing treatments and interventions, ordering and review of laboratory studies, ordering and review of radiographic studies, pulse oximetry and re-evaluation of patient's condition.      For questions or updates, please contact [place] Please consult [email or web address] for contact info under        Signed, Sanda Klein, MD  04/02/2022, 8:24 AM

## 2022-04-02 NOTE — Progress Notes (Signed)
PHARMACY - TOTAL PARENTERAL NUTRITION CONSULT NOTE   Indication: Prolonged ileus  Patient Measurements: Height: '4\' 9"'$  (144.8 cm) Weight: 57.8 kg (127 lb 6.8 oz) IBW/kg (Calculated) : 38.6 TPN AdjBW (KG): 40.6 Body mass index is 27.57 kg/m. Usual Weight: ~118 lbs per chart review   Assessment:  66 yo W admitted with BL LE critical limb ischemia s/p aortofemoral bypass, BL femoral endarterectomy and Left-sided profunda endarterectomy with bovine pericardial patch 5/8. Patient with cardiogenic shock post-procedure now fluid overloaded. Patient with return of bowel function and multiple large stools 5/17, however, continues to have emesis and high NGT output. Imagining consistent with prolonged ileus. Patient received some trickle TF 5/11-5/13 and 5/16. While on TF (received up to 76g dextrose/24hr), patient required 41- 60 units of insulin. Patient is at risk for refeeding. Pharmacy consulted for TPN.   Of note, triglycerides are elevated at baseline (have been as high as 499 this admission while on propofol) but seem to have tolerated initial low dose lipids in TPN with no increase in TG past 12 hours of TPN. Discussed fluid status with CCM/Dr. Percival Spanish, pt appears dry with Na up to 153 and CVP 5. Plan to start D5 at 1m/hr and liberalize TPN fluids. **First set of labs drawn 5/19 a.m. were contaminated with TPN (this is why glucose so high)**  Glucose / Insulin: A1C 9.2. BG 70-240, on D5 566mhr for 7.5 hours 5/19, used 24 units SSI and 100 units insulin in TPN (BG and insulin requirement increased after new TPN hung at 1800)  Electrolytes: Na 149, K 4.2 (s/p 50 mEq IV, goal >/=4), Cl 119, CoCa 10.3, others wnl Renal: Scr 0.98, BUN 33 Hepatic: LFT/Tbili wnl, albumin 2.5, TG 215 (stable) Intake / Output; MIVF: UOP 0.8 ml/kg/d, NGT 350, LBM 5/19. CVP 5. Starting D5 at 50107mr per CCM (ok with cards) as pt appears dry this morning. GI Imaging: 5/8 KUB: unremarkable 5/13 KUB: increased small  bowel gas, ileus vs obstruction 5/14 KUB: ileus vs SBO  5/15 KUB: Similar gaseous distension of small bowel likely resolving ileus  5/17 KUB: Similar small bowel and proximal colonic distension consistent with postoperative ileus  GI Surgeries / Procedures:  5/17 NGT placement   Central access: PICC 5/17  TPN start date: 5/18  Nutritional Goals: Goal TPN rate (non-concentrated) is 70 mL/hr, 57 g/L of 10% protein, 15% dextrose, 30g/L lipids (provides 97 g of protein, 252 g dextrose, 54 g lipids and 1778 kcals per day)  RD Assessment: Estimated Needs Total Energy Estimated Needs: 1750-1950 kcals Total Protein Estimated Needs: 95-110 g Total Fluid Estimated Needs: >/= 1.7 L  Current Nutrition:  TPN and NPO   Plan:  Continue TPN at goal rate of 70 mL/hr at 1800 (provides 97 g protein, 1778 kcals, meeting 100% of estimated needs)  Electrolytes in TPN:  Decrease  Ca 5 mEq/L, Phos 12 mmol/L;  Continue Na to 0 mEq/L, K to 50 mEq/L, Mg to 3 mEq/L;  Cl:Ac max acetate Add standard MVI and trace elements to TPN Increase insulin in TPN to 120 units   Continue resistant q4h SSI and decrease as able  Monitor TPN labs daily until stable at goal then on Mon/Thurs F/u triglyceride trend closely with increased lipids in TPN F/u fluid status - can go back to more concentrated TPN if needed  LydBenetta SparharmD, BCPS, BCCUnionarmacist  Please check AMION for all [facility] [place] numbers After 10:00 PM, call [place]

## 2022-04-03 ENCOUNTER — Inpatient Hospital Stay (HOSPITAL_COMMUNITY): Payer: BC Managed Care – PPO

## 2022-04-03 DIAGNOSIS — I739 Peripheral vascular disease, unspecified: Secondary | ICD-10-CM | POA: Diagnosis not present

## 2022-04-03 DIAGNOSIS — E43 Unspecified severe protein-calorie malnutrition: Secondary | ICD-10-CM | POA: Diagnosis not present

## 2022-04-03 DIAGNOSIS — I5021 Acute systolic (congestive) heart failure: Secondary | ICD-10-CM

## 2022-04-03 DIAGNOSIS — E870 Hyperosmolality and hypernatremia: Secondary | ICD-10-CM

## 2022-04-03 DIAGNOSIS — I7409 Other arterial embolism and thrombosis of abdominal aorta: Secondary | ICD-10-CM | POA: Diagnosis not present

## 2022-04-03 LAB — BASIC METABOLIC PANEL
Anion gap: 3 — ABNORMAL LOW (ref 5–15)
BUN: 26 mg/dL — ABNORMAL HIGH (ref 8–23)
CO2: 27 mmol/L (ref 22–32)
Calcium: 9.3 mg/dL (ref 8.9–10.3)
Chloride: 119 mmol/L — ABNORMAL HIGH (ref 98–111)
Creatinine, Ser: 0.78 mg/dL (ref 0.44–1.00)
GFR, Estimated: >60 mL/min (ref >60)
Glucose, Bld: 261 mg/dL — ABNORMAL HIGH (ref 70–99)
Potassium: 4.1 mmol/L (ref 3.5–5.1)
Sodium: 149 mmol/L — ABNORMAL HIGH (ref 135–145)

## 2022-04-03 LAB — GLUCOSE, CAPILLARY
Glucose-Capillary: 201 mg/dL — ABNORMAL HIGH (ref 70–99)
Glucose-Capillary: 221 mg/dL — ABNORMAL HIGH (ref 70–99)
Glucose-Capillary: 227 mg/dL — ABNORMAL HIGH (ref 70–99)
Glucose-Capillary: 250 mg/dL — ABNORMAL HIGH (ref 70–99)
Glucose-Capillary: 254 mg/dL — ABNORMAL HIGH (ref 70–99)
Glucose-Capillary: 276 mg/dL — ABNORMAL HIGH (ref 70–99)
Glucose-Capillary: 277 mg/dL — ABNORMAL HIGH (ref 70–99)

## 2022-04-03 LAB — ECHOCARDIOGRAM LIMITED
AR max vel: 1.48 cm2
AV Peak grad: 11.7 mmHg
Ao pk vel: 1.71 m/s
Area-P 1/2: 10.25 cm2
Calc EF: 48.5 %
Height: 57 in
S' Lateral: 2.3 cm
Single Plane A2C EF: 51 %
Single Plane A4C EF: 48.8 %
Weight: 2038.81 oz

## 2022-04-03 LAB — MAGNESIUM: Magnesium: 1.9 mg/dL (ref 1.7–2.4)

## 2022-04-03 LAB — PHOSPHORUS: Phosphorus: 3.2 mg/dL (ref 2.5–4.6)

## 2022-04-03 LAB — APTT: aPTT: 28 seconds (ref 24–36)

## 2022-04-03 MED ORDER — LOSARTAN POTASSIUM 25 MG PO TABS
25.0000 mg | ORAL_TABLET | Freq: Every day | ORAL | Status: DC
  Administered 2022-04-03 – 2022-04-05 (×3): 25 mg
  Filled 2022-04-03 (×3): qty 1

## 2022-04-03 MED ORDER — ORAL CARE MOUTH RINSE
15.0000 mL | Freq: Two times a day (BID) | OROMUCOSAL | Status: DC
  Administered 2022-04-03 – 2022-05-01 (×56): 15 mL via OROMUCOSAL

## 2022-04-03 MED ORDER — TRAVASOL 10 % IV SOLN
INTRAVENOUS | Status: AC
  Filled 2022-04-03: qty 974.4

## 2022-04-03 MED ORDER — PERFLUTREN LIPID MICROSPHERE
1.0000 mL | INTRAVENOUS | Status: AC | PRN
  Administered 2022-04-03: 2 mL via INTRAVENOUS

## 2022-04-03 NOTE — Progress Notes (Signed)
Progress Note  Patient Name: Gabrielle [name] Date of Encounter: 04/03/2022  [facility] Cardiologist: Janina Mayo, MD   Subjective   Successfully extubated yesterday.  She appears to be wide awake and alert and attempts to mouth words, but is not speaking.  Inpatient Medications    Scheduled Meds:  artificial tears  1 application. Both Eyes Q8H   aspirin  81 mg Per Tube Daily   bacitracin   Topical TID   chlorhexidine gluconate (MEDLINE KIT)  15 mL Mouth Rinse BID   Chlorhexidine Gluconate Cloth  6 each Topical Daily   clonazepam  0.25 mg Oral BID   docusate  100 mg Per Tube BID   heparin injection (subcutaneous)  5,000 Units Subcutaneous Q8H   insulin aspart  0-20 Units Subcutaneous Q4H   leptospermum manuka honey  1 application. Topical Daily   mouth rinse  15 mL Mouth Rinse 10 times per day   oxyCODONE  5 mg Per Tube Q6H   pantoprazole (PROTONIX) IV  40 mg Intravenous QHS   polyethylene glycol  17 g Per Tube Daily   rosuvastatin  40 mg Per Tube Daily   sodium chloride flush  10-40 mL Intracatheter Q12H   sodium chloride flush  10-40 mL Intracatheter Q12H   Continuous Infusions:  sodium chloride     sodium chloride 10 mL/hr at 03/30/22 2000   magnesium sulfate bolus IVPB     TPN ADULT (ION) 70 mL/hr at 04/03/22 0600   TPN ADULT (ION)     PRN Meds: Place/Maintain arterial line **AND** sodium chloride, sodium chloride, acetaminophen (TYLENOL) oral liquid 160 mg/5 mL, bisacodyl, fentaNYL (SUBLIMAZE) injection, fentaNYL (SUBLIMAZE) injection, guaiFENesin-dextromethorphan, hydrALAZINE, labetalol, lip balm, magnesium sulfate bolus IVPB, ondansetron, phenol, sodium chloride flush   Vital Signs    Vitals:   04/03/22 0300 04/03/22 0400 04/03/22 0500 04/03/22 0600  BP: (!) 164/77 (!) 150/66 (!) 156/78 (!) 153/101  Pulse: (!) 112 (!) 111 (!) 109 (!) 113  Resp: (!) 28 (!) 24 (!) 21 17  Temp:  97.9 F (36.6 C)    TempSrc:  Oral    SpO2: 100% 100% 100% 98%  Weight:       Height:        Intake/Output Summary (Last 24 hours) at 04/03/2022 0815 Last data filed at 04/03/2022 0600 Gross per 24 hour  Intake 1589.24 ml  Output 2070 ml  Net -480.76 ml      04/02/2022    5:00 AM 04/01/2022    5:00 AM 03/31/2022    4:32 AM  Last 3 Weights  Weight (lbs) 127 lb 6.8 oz 127 lb 13.9 oz 127 lb 13.9 oz  Weight (kg) 57.8 kg 58 kg 58 kg      Telemetry    Sinus rhythm- Personally Reviewed  ECG    No new tracing- Personally Reviewed  Physical Exam  Lying fully supine in bed without any breathing difficulty.  NG tube in place. GEN: No acute distress.   Neck: No JVD Cardiac: RRR, no murmurs, rubs, or gallops.  Respiratory: Clear to auscultation bilaterally. GI: Soft, nontender, non-distended  MS: No edema; No deformity. Neuro:  Nonfocal  Psych: Normal affect   Labs    High Sensitivity Troponin:   Recent Labs  Lab 03/23/22 1138 03/24/22 0843 03/25/22 1037 03/30/22 0057 03/30/22 0307  TROPONINIHS 3,592* 5,007* 2,820* [place]  Lab 03/31/22 4503 04/01/22 0440 04/01/22 0751 04/02/22 0217 04/03/22 0525  NA  148* 150* 153* 149* 149*  K 3.8 4.1 3.0* 4.2 4.1  CL 117* 118* 126* 119* 119*  CO2 26 28 23 26 27  GLUCOSE 129* 365* 70 235* 261*  BUN 38* 37* 32* 33* 26*  CREATININE 1.27* 1.14* 0.92 0.98 0.78  CALCIUM 8.6* 8.9 7.4* 9.1 9.3  MG 2.5* 2.6* 2.2 2.2 1.9  PROT 6.2* 6.3*  --   --   --   ALBUMIN 2.7* 2.5*  --   --   --   AST 33 28  --   --   --   ALT 22 19  --   --   --   ALKPHOS 126 112  --   --   --   BILITOT 0.9 1.0  --   --   --   GFRNONAA 47* 53* >60 >60 >60  ANIONGAP 5 4* 4* 4* 3*    Lipids  Recent Labs  Lab 04/02/22 0217  TRIG 215*    Hematology Recent Labs  Lab 03/30/22 0307 03/31/22 0425 04/02/22 0217  WBC 15.8* 14.9* 16.6*  RBC 3.70* 2.87* 3.03*  HGB 10.9* 8.3* 8.8*  HCT 34.6* 27.4* 29.1*  MCV 93.5 95.5 96.0  MCH 29.5 28.9 29.0  MCHC 31.5 30.3 30.2  RDW 14.8 14.3 14.4  PLT 342 272  354   Thyroid No results for input(s): TSH, FREET4 in the last 168 hours.  BNP Recent Labs  Lab 03/30/22 0307  BNP 559.7*    DDimer No results for input(s): DDIMER in the last 168 hours.   Radiology    No results found.  Cardiac Studies   Echocardiogram 03/23/2022    1. Left ventricular ejection fraction, by estimation, is 20 to 25%. The left ventricle has severely decreased function. The left ventricle demonstrates global hypokinesis. Left ventricular diastolic parameters are consistent with Grade II diastolic  dysfunction (pseudonormalization). Elevated left atrial pressure.   2. Right ventricular systolic function was not well visualized. The right ventricular size is normal.   3. Mild mitral valve regurgitation.   4. The aortic valve is tricuspid. Aortic valve regurgitation is not  visualized. Aortic valve sclerosis is present, with no evidence of aortic valve stenosis.   5. The inferior vena cava is dilated in size with <50% respiratory variability, suggesting right atrial pressure of 15 mmHg.    Comparison(s): Compared to prior TTE on 03/22/22, the LVEF has dropped  significantly to 20% from 55-60%.     Patient Profile     66 y.o. female with PAD presenting with critical limb ischemia, poorly controlled diabetes mellitus, hyperlipidemia, hypertension developing acute systolic heart failure and cardiogenic shock roughly 48 hours following aortobifemoral bypass surgery and bilateral femoral enterectomy, left-sided profunda endarterectomy, performed 03/21/2022.  Echocardiogram showed acute reduction in LVEF from 55 to 60% to 20-25% from 1 day to the next 05/09 - 05/10.    Assessment & Plan    Cardiogenic shock: Hemodynamically stable without pressors.  Differential diagnosis includes severe Takotsubo syndrome versus global left ventricular ischemia due to extensive CAD.  Plan cardiac catheterization at some point next week, when she has recovered better and her renal function  has stabilized.  Cardiac troponin did increase to 5000 but this is a minor increase, disproportional to the extent of left ventricular dysfunction.  Plan limited echo tomorrow. CHF: General impression is that she is hypovolemic with hypernatremia and low CVP yesterday, today CVP = 6.  Oxygenating well.  Limited echo tomorrow.  Blood pressure is higher.    We will add ARB (preferred to ACE inhibitor in case we will eventually transition to [facility]).  If EF has improved can also add carvedilol.  Prior to admission she was receiving amlodipine and lisinopril for hypertension.  Chronically on Farxiga for diabetes. AKI/Hypernatremia: Renal parameters are back to normal hypernatremia has improved.  Suspect she needs more volume/free water. Acute respiratory failure: Successfully extubated yesterday.     For questions or updates, please contact [place] Please consult [email or web address] for contact info under        Signed, Sanda Klein, MD  04/03/2022, 8:15 AM

## 2022-04-03 NOTE — Progress Notes (Signed)
PHARMACY - TOTAL PARENTERAL NUTRITION CONSULT NOTE   Indication: Prolonged ileus  Patient Measurements: Height: '4\' 9"'$  (144.8 cm) Weight: 57.8 kg (127 lb 6.8 oz) IBW/kg (Calculated) : 38.6 TPN AdjBW (KG): 40.6 Body mass index is 27.57 kg/m. Usual Weight: ~118 lbs per chart review   Assessment:  66 yo W admitted with BL LE critical limb ischemia s/p aortofemoral bypass, BL femoral endarterectomy and Left-sided profunda endarterectomy with bovine pericardial patch 5/8. Patient with cardiogenic shock post-procedure now fluid overloaded. Patient with return of bowel function and multiple large stools 5/17, however, continues to have emesis and high NGT output. Imagining consistent with prolonged ileus. Patient received some trickle TF 5/11-5/13 and 5/16. While on TF (received up to 76g dextrose/24hr), patient required 41- 60 units of insulin. Patient is at risk for refeeding. Pharmacy consulted for TPN.   Of note, triglycerides are elevated at baseline (have been as high as 499 this admission while on propofol) but has been stable since TPN start. Per team, pt appears dry with Na elevated and CVP low. Ok to liberalize TPN fluids.   Glucose / Insulin: A1C 9.2. BG 161-261, used 39 units SSI and 120 units insulin in TPN   Electrolytes: Na 149, Cl 119, CoCa 10.5, others wnl Renal: Scr 0.78, BUN 26 Hepatic: LFT/Tbili wnl, albumin 2.5, TG 215 (stable) Intake / Output; MIVF: UOP 1.5 ml/kg/d, NGT 100 ml, LBM 5/20 GI Imaging: 5/8 KUB: unremarkable 5/13 KUB: increased small bowel gas, ileus vs obstruction 5/14 KUB: ileus vs SBO  5/15 KUB: Similar gaseous distension of small bowel likely resolving ileus  5/17 KUB: Similar small bowel and proximal colonic distension consistent with postoperative ileus  GI Surgeries / Procedures:  5/17 NGT placement   Central access: PICC 5/17  TPN start date: 5/18  Nutritional Goals: Goal TPN rate (non-concentrated) is 70 mL/hr, 57 g/L of 10% protein, 15%  dextrose, 30 g/L lipids (provides 97 g of protein, 252 g dextrose, 54 g lipids and 1778 kcals per day)  RD Assessment: Estimated Needs Total Energy Estimated Needs: 1750-1950 kcals Total Protein Estimated Needs: 95-110 g Total Fluid Estimated Needs: >/= 1.7 L  Current Nutrition:  TPN and NPO   Plan:  Continue TPN at goal rate of 70 mL/hr at 1800 (provides 97 g protein, 1778 kcals, meeting 100% of estimated needs)  Electrolytes in TPN: Increase Mg 6 mEq/L;  Decrease  Ca 2 mEq/L;  Continue Na 0 mEq/L, K 50 mEq/L, Phos 12 mmol/L;  max acetate Add standard MVI and trace elements to TPN Increase insulin in TPN to 145 units   Continue resistant q4h SSI and decrease as able  Next TPN labs 5/23 then on Mon/Thurs F/u triglyceride trend with increased lipids in [place], PharmD, BCPS, [place] Pharmacist  Please check AMION for all [place] phone numbers After 10:00 PM, call [place]

## 2022-04-03 NOTE — Progress Notes (Signed)
[facility] OF [place] PROGRESS NOTE  ASSESSMENT / PLAN: Gabrielle [name] is a 66 y.o. female status post aortobifemoral bypass 02/14/67, complicated by postoperative myocardial infarction and heart failure.  She has required prolonged intubation and mechanical ventilation.  She seems to be tolerated her trial of extubation very well.  Mobilize as able. Slowly increase TF to goal. May be ready to remove NG tomorrow.  Appreciate cardiology and critical care service assistance.  Husband updated at bedside.  SUBJECTIVE: No events overnight.  Resting comfortably.  OBJECTIVE: BP (!) 148/74   Pulse (!) 114   Temp 98.2 F (36.8 C) (Oral)   Resp (!) 25   Ht '4\' 9"'$  (1.448 m)   Wt 57.8 kg   SpO2 99%   BMI 27.57 kg/m   Intake/Output Summary (Last 24 hours) at 04/03/2022 0947 Last data filed at 04/03/2022 3419 Gross per 24 hour  Intake 1667.96 ml  Output 2380 ml  Net -712.04 ml     Constitutional: Well-appearing woman in no acute distress.  Intubated and sedated. CNS: Awake and alert. Globally weak. Cardiac: Regular rate and rhythm. Pulmonary: Unlabored Abdomen: Soft abdomen.  Mild distention.  Nontender.  Midline incision healing well. Vascular: Groin incisions healing well.  Good Doppler flow in the dorsalis pedis arteries bilaterally.     Latest Ref Rng & Units 04/02/2022    2:17 AM 03/31/2022    4:25 AM 03/30/2022    3:07 AM  CBC  WBC 4.0 - 10.5 K/uL 16.6   14.9   15.8    Hemoglobin 12.0 - 15.0 g/dL 8.8   8.3   10.9    Hematocrit 36.0 - 46.0 % 29.1   27.4   34.6    Platelets 150 - 400 K/uL 354   272   342          Latest Ref Rng & Units 04/03/2022    5:25 AM 04/02/2022    2:17 AM 04/01/2022    7:51 AM  CMP  Glucose 70 - 99 mg/dL 261   235   70    BUN 8 - 23 mg/dL 26   33   32    Creatinine 0.44 - 1.00 mg/dL 0.78   0.98   0.92    Sodium 135 - 145 mmol/L 149   149   153    Potassium 3.5 - 5.1 mmol/L 4.1   4.2   3.0    Chloride 98 - 111 mmol/L 119    119   126    CO2 22 - 32 mmol/L '27   26   23    '$ Calcium 8.9 - 10.3 mg/dL 9.3   9.1   7.4      Estimated Creatinine Clearance: 51.2 mL/min (by C-G formula based on SCr of 0.78 mg/dL).  Gabrielle [name]. Stanford Breed, MD [facility] Phone Number: [phone] 04/03/2022 9:47 AM

## 2022-04-03 NOTE — Progress Notes (Signed)
NAME:  Gabrielle [name], MRN:  [id number], DOB:  26-Dec-1955, LOS: 2 ADMISSION DATE:  03/21/2022, CONSULTATION DATE:  03/22/22 REFERRING MD:  Unk Lightning, CHIEF COMPLAINT:  abd pain   History of Present Illness:  66 year old woman with hx of rectal cancer, DM2, HTN, HLD who presented for open aortobifemoral bypass by Dr. Unk Lightning for critical limb ischemia.  Postop course complicated by shock for which PCCM is consulted.  Patient underwent procedure with 14-7 bifurcated dacron graft, bilateral femoral endarterectomy late yesterday.  EBL 1.5L.  Currently c/o SOB and abdominal discomfort.  Pertinent  Medical History  DM2- A1c 9.3% HTN HLD Severe PVD  Significant Hospital Events: Including procedures, antibiotic start and stop dates in addition to other pertinent events   5/8  Aortobifemoral bypass using 14 x 7 bifurcated Dacron graft Bilateral femoral endarterectomy,  Left-sided profunda endarterectomy with bovine pericardial patch 5/9 PCCM consult for hypotension 5/10 worsening respiratory distress, intubated 5/11 diuresis 5/16 abdominal distention, increase bowel reg, attempts to wean from ventilator limited by low volumes 5/17 tachycardic, c/f STEMI, per cardiology no STEMI, PICC line placed for TPN 5/18 return of bowel function 5/20 extubated   Interim History / Subjective:  Did ok after extubation yesterday. More alert and aware this morning. Improved pain control. Not as delirious  Objective   Blood pressure 134/64, pulse (!) 106, temperature 97.9 F (36.6 C), temperature source Oral, resp. rate (!) 26, height '4\' 9"'$  (1.448 m), weight 57.8 kg, SpO2 100 %. CVP:  [0 mmHg-82 mmHg] 6 mmHg      Intake/Output Summary (Last 24 hours) at 04/03/2022 0900 Last data filed at 04/03/2022 0800 Gross per 24 hour  Intake 1657.96 ml  Output 2380 ml  Net -722.04 ml   Filed Weights   03/31/22 0432 04/01/22 0500 04/02/22 0500  Weight: 58 kg 58 kg 57.8 kg    Examination: Laying flat  comfortably no distress Tachycardic, regular No respiratory distress Abdomen is soft, minimally tender Well healed midline surgical incision   Labs/imaging reviewed:  Sodium 149 Chloride 119 Creatinine 0.78 CBGs still high  Resolved Hospital Problem list   Postoperative shock Postoperative ileus Acute hypoxemic respiratory failure AKI Acute metabolic encephalopathy/icu delirium Assessment & Plan:   Elevated Troponin New Heart Failure Reduced EF Suspected ICM Patient clinically appears dry, with CVP of 5 and elevated sodium. Likely has had fluid loss with emesis, ng suction, bowel movements, and insensible losses. Will give fluids with TPN per pharmacy.  - cardiology consult, appreciate assistance - ASA 81, may be able to start GDMT over weekend - cardiac cath likely next week - monitor on telemetry  Hypernatremia Patient with elevated sodium in the setting of recent diuresis and fluid losses.  - continue free water with TPN  PAD s/p Aortobifemoral bypass  HTN, HLD hx  Vascular surgery following. Surgical site clear, dry and intact, perfusion to lower extremities stable.  - continue statin, ASA  DM2 - still hyperglycemic - talked to pharmacy this am about increasing insulin in TF again  Anemia of critical illness Stable  Best Practice (right click and "Reselect all SmartList Selections" daily)   Diet/type: NPO on TPN. Diet advanced per surgery DVT prophylaxis: heparin ppx GI prophylaxis: N/A Lines: Central line Foley:  Yes, and it is no longer needed and removal ordered  Code Status:  full code Last date of multidisciplinary goals of care discussion: per primary. I updated husband at bedside of improving progress  She can probably transfer out of the ICU  today but I defer to surgery.   Lenice Llamas, MD Pulmonary and [place] 04/03/2022 9:00 AM Pager: see AMION  If no response to pager, please call critical care on call  (see AMION) until 7pm After 7:00 pm call [place]

## 2022-04-04 DIAGNOSIS — I739 Peripheral vascular disease, unspecified: Secondary | ICD-10-CM | POA: Diagnosis not present

## 2022-04-04 DIAGNOSIS — R570 Cardiogenic shock: Secondary | ICD-10-CM | POA: Diagnosis not present

## 2022-04-04 LAB — GLUCOSE, CAPILLARY
Glucose-Capillary: 221 mg/dL — ABNORMAL HIGH (ref 70–99)
Glucose-Capillary: 227 mg/dL — ABNORMAL HIGH (ref 70–99)
Glucose-Capillary: 233 mg/dL — ABNORMAL HIGH (ref 70–99)
Glucose-Capillary: 235 mg/dL — ABNORMAL HIGH (ref 70–99)
Glucose-Capillary: 240 mg/dL — ABNORMAL HIGH (ref 70–99)
Glucose-Capillary: 240 mg/dL — ABNORMAL HIGH (ref 70–99)

## 2022-04-04 LAB — APTT: aPTT: 29 seconds (ref 24–36)

## 2022-04-04 MED ORDER — OXYCODONE HCL 5 MG PO TABS
5.0000 mg | ORAL_TABLET | Freq: Four times a day (QID) | ORAL | Status: DC | PRN
  Administered 2022-04-04 – 2022-04-29 (×18): 5 mg
  Filled 2022-04-04 (×20): qty 1

## 2022-04-04 MED ORDER — QUETIAPINE FUMARATE 25 MG PO TABS
25.0000 mg | ORAL_TABLET | Freq: Every day | ORAL | Status: DC
  Administered 2022-04-04 – 2022-04-30 (×27): 25 mg
  Filled 2022-04-04 (×27): qty 1

## 2022-04-04 MED ORDER — TRAVASOL 10 % IV SOLN
INTRAVENOUS | Status: AC
  Filled 2022-04-04: qty 1008

## 2022-04-04 MED ORDER — INSULIN DETEMIR 100 UNIT/ML ~~LOC~~ SOLN
30.0000 [IU] | Freq: Two times a day (BID) | SUBCUTANEOUS | Status: DC
  Administered 2022-04-04 (×2): 30 [IU] via SUBCUTANEOUS
  Filled 2022-04-04 (×4): qty 0.3

## 2022-04-04 MED ORDER — CARVEDILOL 6.25 MG PO TABS
6.2500 mg | ORAL_TABLET | Freq: Two times a day (BID) | ORAL | Status: DC
Start: 2022-04-04 — End: 2022-04-06
  Administered 2022-04-04 – 2022-04-06 (×4): 6.25 mg
  Filled 2022-04-04 (×5): qty 1

## 2022-04-04 NOTE — Plan of Care (Signed)
  Problem: Clinical Measurements: Goal: Will remain free from infection Outcome: Progressing Goal: Diagnostic test results will improve Outcome: Progressing Goal: Respiratory complications will improve Outcome: Progressing Goal: Cardiovascular complication will be avoided Outcome: Progressing   Problem: Nutrition: Goal: Adequate nutrition will be maintained Outcome: Progressing Note: Pt is on TPN, plan to start trickle TF tomorrow as bowel motility has progressed positively   Problem: Coping: Goal: Level of anxiety will decrease Outcome: Progressing Note: Pt is calm and cooperative, non verbal or limited verbal interaction but not anxious, reorients and is calmed with conversation   Problem: Elimination: Goal: Will not experience complications related to bowel motility Outcome: Progressing Note: Pt with multiple BMs, hypoactive BS, abdomen softer Goal: Will not experience complications related to urinary retention Outcome: Completed/Met   Problem: Pain Managment: Goal: General experience of comfort will improve Outcome: Progressing   Problem: Safety: Goal: Ability to remain free from injury will improve Outcome: Progressing   Problem: Skin Integrity: Goal: Risk for impaired skin integrity will decrease Outcome: Progressing   Problem: Activity: Goal: Ability to tolerate increased activity will improve Outcome: Completed/Met   Problem: Respiratory: Goal: Ability to maintain a clear airway and adequate ventilation will improve Outcome: Completed/Met   Problem: Role Relationship: Goal: Method of communication will improve Outcome: Completed/Met

## 2022-04-04 NOTE — Progress Notes (Signed)
[facility] OF [place] PROGRESS NOTE  ASSESSMENT / PLAN: Gabrielle [name] is a 66 y.o. female status post aortobifemoral bypass 01/13/94, complicated by postoperative myocardial infarction and heart failure.  She has required prolonged intubation and mechanical ventilation.  Overall improved from last week. Extubated, minimal cough effort Some delirium.  Not speaking but following commands.  NGT output low, place to gravity today, possibly pull this afternoon.  Hold TF during NGT gravity trial Appreciate ICU help with TPN, electrolytes.  EF improved - appreciated cardiology input regarding catheterization  Pt not medically appropriate for progressive due to current problems.  Can transfer to another ICU should heart beds be needed.   Would like consideration for all pain meds and others meds that could be involved in delirium to be weaned or held. I would expect Annalyce to have more energy.  Aggressive PT, OT  Husband updated via phone.   SUBJECTIVE: No events overnight.  Resting comfortably.  OBJECTIVE: BP (!) 151/67   Pulse (!) 116   Temp 98 F (36.7 C) (Axillary)   Resp (!) 22   Ht '4\' 9"'$  (1.448 m)   Wt 57.8 kg   SpO2 100%   BMI 27.57 kg/m   Intake/Output Summary (Last 24 hours) at 04/04/2022 0804 Last data filed at 04/04/2022 0600 Gross per 24 hour  Intake 2601 ml  Output 1580 ml  Net 1021 ml     Constitutional: Well-appearing woman in no acute distress.  Intubated and sedated. CNS: Awake and alert. Globally weak. Cardiac: Regular rate and rhythm. Pulmonary: Unlabored Abdomen: Soft abdomen.  Mild distention.  Nontender.  Midline incision healing well. Vascular: Groin incisions healing well.  Good Doppler flow in the dorsalis pedis arteries bilaterally.     Latest Ref Rng & Units 04/02/2022    2:17 AM 03/31/2022    4:25 AM 03/30/2022    3:07 AM  CBC  WBC 4.0 - 10.5 K/uL 16.6   14.9   15.8    Hemoglobin 12.0 - 15.0 g/dL 8.8   8.3   10.9     Hematocrit 36.0 - 46.0 % 29.1   27.4   34.6    Platelets 150 - 400 K/uL 354   272   342          Latest Ref Rng & Units 04/03/2022    5:25 AM 04/02/2022    2:17 AM 04/01/2022    7:51 AM  CMP  Glucose 70 - 99 mg/dL 261   235   70    BUN 8 - 23 mg/dL 26   33   32    Creatinine 0.44 - 1.00 mg/dL 0.78   0.98   0.92    Sodium 135 - 145 mmol/L 149   149   153    Potassium 3.5 - 5.1 mmol/L 4.1   4.2   3.0    Chloride 98 - 111 mmol/L 119   119   126    CO2 22 - 32 mmol/L '27   26   23    '$ Calcium 8.9 - 10.3 mg/dL 9.3   9.1   7.4      Estimated Creatinine Clearance: 51.2 mL/min (by C-G formula based on SCr of 0.78 mg/dL).  Broadus John MD [facility] Phone Number: [phone] 04/04/2022 8:04 AM

## 2022-04-04 NOTE — Consult Note (Signed)
[place] Nurse wound follow up Wound type:Deep tissue injury to sacrococcygeal area.  Has continued to evolve.  Due to presence of devitalized tissue, will continue Medihoney and initiate consult for Hydrotherapy (PT) .  Measurement: 7 cm x 14 cm unable to visualize wound bed  Wound bed: Darkened  Drainage (amount, consistency, odor) minimal serosanguinous  musty odor Periwound: intact  foley remains.   Dressing procedure/placement/frequency:Continue medihoney and initiate hydrotherapy consult.  Will follow.  Domenic Moras MSN, RN, FNP-BC [facility]ON Wound, Ostomy, Continence Nurse Pager [phone]

## 2022-04-04 NOTE — Progress Notes (Signed)
Progress Note  Patient Name: Gabrielle [name] Date of Encounter: 04/04/2022  [facility] Cardiologist: Janina Mayo, MD   Subjective   Continues to make improvement.  Mild delirium noted.  She nodded her head yes.  Not vocalizing currently.  Her prior coworker was here.  Inpatient Medications    Scheduled Meds:  artificial tears  1 application. Both Eyes Q8H   aspirin  81 mg Per Tube Daily   bacitracin   Topical TID   Chlorhexidine Gluconate Cloth  6 each Topical Daily   docusate  100 mg Per Tube BID   heparin injection (subcutaneous)  5,000 Units Subcutaneous Q8H   insulin aspart  0-20 Units Subcutaneous Q4H   leptospermum manuka honey  1 application. Topical Daily   losartan  25 mg Per Tube Daily   mouth rinse  15 mL Mouth Rinse BID   oxyCODONE  5 mg Per Tube Q6H   pantoprazole (PROTONIX) IV  40 mg Intravenous QHS   polyethylene glycol  17 g Per Tube Daily   rosuvastatin  40 mg Per Tube Daily   sodium chloride flush  10-40 mL Intracatheter Q12H   sodium chloride flush  10-40 mL Intracatheter Q12H   Continuous Infusions:  sodium chloride 10 mL/hr at 04/04/22 0600   magnesium sulfate bolus IVPB     TPN ADULT (ION) 70 mL/hr at 04/04/22 0600   PRN Meds: sodium chloride, acetaminophen (TYLENOL) oral liquid 160 mg/5 mL, bisacodyl, fentaNYL (SUBLIMAZE) injection, fentaNYL (SUBLIMAZE) injection, guaiFENesin-dextromethorphan, hydrALAZINE, labetalol, lip balm, magnesium sulfate bolus IVPB, ondansetron, phenol, sodium chloride flush   Vital Signs    Vitals:   04/04/22 0400 04/04/22 0500 04/04/22 0600 04/04/22 0700  BP: (!) 142/67 (!) 143/67 (!) 151/67   Pulse: (!) 118 (!) 117 (!) 116   Resp: (!) 25 18 (!) 22   Temp:  98 F (36.7 C)  98 F (36.7 C)  TempSrc:  Axillary    SpO2: 99% 100% 100%   Weight:      Height:        Intake/Output Summary (Last 24 hours) at 04/04/2022 0848 Last data filed at 04/04/2022 0600 Gross per 24 hour  Intake 2601 ml  Output 1580 ml   Net 1021 ml      04/02/2022    5:00 AM 04/01/2022    5:00 AM 03/31/2022    4:32 AM  Last 3 Weights  Weight (lbs) 127 lb 6.8 oz 127 lb 13.9 oz 127 lb 13.9 oz  Weight (kg) 57.8 kg 58 kg 58 kg      Telemetry    No adverse arrhythmias- Personally Reviewed  ECG    Sinus rhythm with nonspecific T wave changes on 03/31/2022- Personally Reviewed  Physical Exam   GEN: Appears comfortable Neck: No JVD Cardiac: RRR, no murmurs, rubs, or gallops.  Respiratory: Clear to auscultation bilaterally. GI: Soft, nontender, non-distended  MS: No edema; No deformity.  Globally weak and Neuro:  Nonfocal  Psych: Mildly delirious  Labs    High Sensitivity Troponin:   Recent Labs  Lab 03/23/22 1138 03/24/22 0843 03/25/22 1037 03/30/22 0057 03/30/22 0307  TROPONINIHS 3,592* 5,007* 2,820* 347* 316*     Chemistry Recent Labs  Lab 03/31/22 0735 04/01/22 0440 04/01/22 0751 04/02/22 0217 04/03/22 0525  NA 148* 150* 153* 149* 149*  K 3.8 4.1 3.0* 4.2 4.1  CL 117* 118* 126* 119* 119*  CO2 '26 28 23 26 27  '$ GLUCOSE 129* 365* 70 235* 261*  BUN 38*  37* 32* 33* 26*  CREATININE 1.27* 1.14* 0.92 0.98 0.78  CALCIUM 8.6* 8.9 7.4* 9.1 9.3  MG 2.5* 2.6* 2.2 2.2 1.9  PROT 6.2* 6.3*  --   --   --   ALBUMIN 2.7* 2.5*  --   --   --   AST 33 28  --   --   --   ALT 22 19  --   --   --   ALKPHOS 126 112  --   --   --   BILITOT 0.9 1.0  --   --   --   GFRNONAA 47* 53* >60 >60 >60  ANIONGAP 5 4* 4* 4* 3*    Lipids  Recent Labs  Lab 04/02/22 0217  TRIG 215*    Hematology Recent Labs  Lab 03/30/22 0307 03/31/22 0425 04/02/22 0217  WBC 15.8* 14.9* 16.6*  RBC 3.70* 2.87* 3.03*  HGB 10.9* 8.3* 8.8*  HCT 34.6* 27.4* 29.1*  MCV 93.5 95.5 96.0  MCH 29.5 28.9 29.0  MCHC 31.5 30.3 30.2  RDW 14.8 14.3 14.4  PLT 342 272 354   Thyroid No results for input(s): TSH, FREET4 in the last 168 hours.  BNP Recent Labs  Lab 03/30/22 0307  BNP 559.7*    DDimer No results for input(s): DDIMER in the  last 168 hours.   Radiology    ECHOCARDIOGRAM LIMITED  Result Date: 04/03/2022    ECHOCARDIOGRAM LIMITED REPORT   Patient Name:   Gabrielle [name] Date of Exam: 04/03/2022 Medical Rec #:  [id number]        Height:       57.0 in Accession #:    [id number]       Weight:       127.4 lb Date of Birth:  10/13/1956        BSA:          1.485 m Patient Age:    66 years         BP:           135/72 mmHg Patient Gender: F                HR:           119 bpm. Exam Location:  Inpatient Procedure: 2D Echo, Limited Echo, Cardiac Doppler, Color Doppler and            Intracardiac Opacification Agent Indications:    CHF  History:        Patient has prior history of Echocardiogram examinations. Risk                 Factors:Hypertension and Diabetes.  Sonographer:    Jyl Heinz Referring Phys: [phone] MIHAI CROITORU  Sonographer Comments: Done in supine. IMPRESSIONS  1. Left ventricular ejection fraction, by estimation, is 55 to 60%. The left ventricle has normal function. The left ventricle has no regional wall motion abnormalities. Left ventricular diastolic parameters are consistent with Grade I diastolic dysfunction (impaired relaxation).  2. Right ventricular systolic function is normal. The right ventricular size is normal.  3. The mitral valve is normal in structure. No evidence of mitral valve regurgitation. No evidence of mitral stenosis.  4. The aortic valve is normal in structure. Aortic valve regurgitation is not visualized. Aortic valve sclerosis is present, with no evidence of aortic valve stenosis.  5. The inferior vena cava is normal in size with greater than 50% respiratory variability, suggesting right atrial pressure of 3 mmHg. Comparison(s): Prior  images reviewed side by side. The left ventricular function has improved. The left ventricular diastolic function has improved. FINDINGS  Left Ventricle: Left ventricular ejection fraction, by estimation, is 55 to 60%. The left ventricle has normal function. The  left ventricle has no regional wall motion abnormalities. Definity contrast agent was given IV to delineate the left ventricular  endocardial borders. The left ventricular internal cavity size was normal in size. There is no left ventricular hypertrophy. Left ventricular diastolic parameters are consistent with Grade I diastolic dysfunction (impaired relaxation). Indeterminate filling pressures. Right Ventricle: The right ventricular size is normal. No increase in right ventricular wall thickness. Right ventricular systolic function is normal. Left Atrium: Left atrial size was normal in size. Right Atrium: Right atrial size was normal in size. Pericardium: There is no evidence of pericardial effusion. Mitral Valve: The mitral valve is normal in structure. No evidence of mitral valve stenosis. Tricuspid Valve: The tricuspid valve is normal in structure. Tricuspid valve regurgitation is not demonstrated. No evidence of tricuspid stenosis. Aortic Valve: The aortic valve is normal in structure. Aortic valve regurgitation is not visualized. Aortic valve sclerosis is present, with no evidence of aortic valve stenosis. Aortic valve peak gradient measures 11.7 mmHg. Pulmonic Valve: The pulmonic valve was normal in structure. Pulmonic valve regurgitation is not visualized. No evidence of pulmonic stenosis. Aorta: The aortic root is normal in size and structure. Venous: The inferior vena cava is normal in size with greater than 50% respiratory variability, suggesting right atrial pressure of 3 mmHg. IAS/Shunts: No atrial level shunt detected by color flow Doppler. LEFT VENTRICLE PLAX 2D LVIDd:         3.00 cm     Diastology LVIDs:         2.30 cm     LV e' medial:    5.66 cm/s LV PW:         0.90 cm     LV E/e' medial:  10.8 LV IVS:        1.10 cm     LV e' lateral:   6.31 cm/s LVOT diam:     1.80 cm     LV E/e' lateral: 9.7 LV SV:         35 LV SV Index:   24 LVOT Area:     2.54 cm  LV Volumes (MOD) LV vol d, MOD A2C: 65.9  ml LV vol d, MOD A4C: 59.2 ml LV vol s, MOD A2C: 32.3 ml LV vol s, MOD A4C: 30.3 ml LV SV MOD A2C:     33.6 ml LV SV MOD A4C:     59.2 ml LV SV MOD BP:      31.0 ml RIGHT VENTRICLE            IVC RV S prime:     9.64 cm/s  IVC diam: 1.10 cm TAPSE (M-mode): 1.8 cm LEFT ATRIUM         Index LA diam:    2.10 cm 1.41 cm/m  AORTIC VALVE AV Area (Vmax): 1.48 cm AV Vmax:        171.00 cm/s AV Peak Grad:   11.7 mmHg LVOT Vmax:      99.70 cm/s LVOT Vmean:     63.900 cm/s LVOT VTI:       0.139 m  AORTA Ao Root diam: 2.70 cm MITRAL VALVE MV Area (PHT): 10.25 cm    SHUNTS MV Decel Time: 74 msec      Systemic VTI:  0.14  m MV E velocity: 60.90 cm/s   Systemic Diam: 1.80 cm MV A velocity: 102.00 cm/s MV E/A ratio:  0.60 Mihai Croitoru MD Electronically signed by Sanda Klein MD Signature Date/Time: 04/03/2022/1:08:47 PM    Final     Cardiac Studies   ECHO limited 04/03/2022:   1. Left ventricular ejection fraction, by estimation, is 55 to 60%. The  left ventricle has normal function. The left ventricle has no regional  wall motion abnormalities. Left ventricular diastolic parameters are  consistent with Grade I diastolic  dysfunction (impaired relaxation).   2. Right ventricular systolic function is normal. The right ventricular  size is normal.   3. The mitral valve is normal in structure. No evidence of mitral valve  regurgitation. No evidence of mitral stenosis.   4. The aortic valve is normal in structure. Aortic valve regurgitation is  not visualized. Aortic valve sclerosis is present, with no evidence of  aortic valve stenosis.   5. The inferior vena cava is normal in size with greater than 50%  respiratory variability, suggesting right atrial pressure of 3 mmHg.   Comparison(s): Prior images reviewed side by side. The left ventricular  function has improved. The left ventricular diastolic function has  improved, previously 20 to 25%.   Patient Profile     66 y.o. female  with PAD presenting  with critical limb ischemia, poorly controlled diabetes mellitus, hyperlipidemia, hypertension developing acute systolic heart failure and cardiogenic shock roughly 48 hours following aortobifemoral bypass surgery and bilateral femoral enterectomy, left-sided profunda endarterectomy, performed 03/21/2022.  Echocardiogram showed acute reduction in LVEF from 55 to 60% to 20-25% from 1 day to the next 05/09 - 05/10.  Limited echo on 04/03/2022 shows normal ejection fraction 55 to 60%  Assessment & Plan    Cardiogenic shock: Hemodynamically stable without pressors, in fact slightly hypertensive this morning.  Her cardiogenic shock/acute systolic heart failure was secondary to Takotsubo cardiomyopathy or stress-induced cardiomyopathy.  Repeat echocardiogram on 04/03/2022 shows normalized ejection fraction up from 20 to 25% to 55 to 60%.  Excellent recovery. Cardiac troponin did increase to 5000 but this is a minor increase, disproportional to the extent of left ventricular dysfunction.  I do not feel strongly that we need to proceed with cardiac catheterization at this time given her marked improvement/normalization of ejection fraction currently. CHF, acute systolic heart failure: General impression is that she is hypovolemic with hypernatremia and low CVP yesterday, today CVP = 9.  Oxygenating well.  Limited echocardiogram showed resolution of EF.  ARB has been added (preferred to ACE inhibitor in case we will eventually transition to [facility]).  I we will add carvedilol 6.25 mg twice a day.  Prior to admission she was receiving amlodipine and lisinopril for hypertension.  Chronically on Farxiga for diabetes. AKI/Hypernatremia: Renal parameters are back to normal hypernatremia has improved.  Suspect she needs more volume/free water. Acute respiratory failure: Successfully extubated   CRITICAL CARE Performed by: Candee Furbish   Total critical care time: 35 minutes  Critical care time was exclusive of  separately billable procedures and treating other patients.  Critical care was necessary to treat or prevent imminent or life-threatening deterioration.  Critical care was time spent personally by me on the following activities: development of treatment plan with patient and/or surrogate as well as nursing, discussions with consultants, evaluation of patient's response to treatment, examination of patient, obtaining history from patient or surrogate, ordering and performing treatments and interventions, ordering and review of laboratory studies, ordering  and review of radiographic studies, pulse oximetry and re-evaluation of patient's condition.    For questions or updates, please contact [place] Please consult [email or web address] for contact info under        Signed, Candee Furbish, MD  04/04/2022, 8:48 AM

## 2022-04-04 NOTE — Progress Notes (Signed)
PHARMACY - TOTAL PARENTERAL NUTRITION CONSULT NOTE   Indication: Prolonged ileus  Patient Measurements: Height: '4\' 9"'$  (144.8 cm) Weight: 57.8 kg (127 lb 6.8 oz) IBW/kg (Calculated) : 38.6 TPN AdjBW (KG): 40.6 Body mass index is 27.57 kg/m. Usual Weight: ~118 lbs per chart review   Assessment:  66 yo W admitted with BL LE critical limb ischemia s/p aortofemoral bypass, BL femoral endarterectomy and Left-sided profunda endarterectomy with bovine pericardial patch 5/8. Patient with cardiogenic shock post-procedure now fluid overloaded. Patient with return of bowel function and multiple large stools 5/17, however, continues to have emesis and high NGT output. Imagining consistent with prolonged ileus. Patient received some trickle TF 5/11-5/13 and 5/16. While on TF (received up to 76g dextrose/24hr), patient required 41- 60 units of insulin. Patient is at risk for refeeding. Pharmacy consulted for TPN.   Of note, triglycerides are elevated at baseline (have been as high as 499 this admission while on propofol) but has been stable since TPN start. Per team, pt appears dry with Na elevated and CVP low. Ok to liberalize TPN fluids.   Glucose / Insulin: A1C 9.2. CBGs 240-270s, used 54 units SSI and 145 units insulin in TPN   Electrolytes: Na 149 - FW, Cl 119, CoCa 10.5, others wnl Renal: Scr 0.78, BUN 26 Hepatic: LFT/Tbili wnl, albumin 2.5, TG 215 (stable) Intake / Output; MIVF: UOP 1.3 ml/kg/d, NGT 120 ml - plan to remove today, LBM 5/21 (type 6) GI Imaging: 5/8 KUB: unremarkable 5/13 KUB: increased small bowel gas, ileus vs obstruction 5/14 KUB: ileus vs SBO  5/15 KUB: Similar gaseous distension of small bowel likely resolving ileus  5/17 KUB: Similar small bowel and proximal colonic distension consistent with postoperative ileus  GI Surgeries / Procedures:  5/17 NGT placement   Central access: PICC 5/17  TPN start date: 5/18  Nutritional Goals: Goal TPN rate (non-concentrated) is 70  mL/hr, 57 g/L of 10% protein, 15% dextrose, 30 g/L lipids (provides 97 g of protein, 252 g dextrose, 54 g lipids and 1778 kcals per day)  RD Assessment: Estimated Needs Total Energy Estimated Needs: 1750-1950 kcals Total Protein Estimated Needs: 95-110 g Total Fluid Estimated Needs: >/= 1.7 L  Current Nutrition:  TPN and NPO   Plan:  Continue TPN at goal rate of 70 mL/hr at 1800 (provides 100.8 g protein, 1751 kcals, meeting 100% of estimated needs)  Electrolytes in TPN: Increase Mg 8 mEq/L; Remove Ca; Continue Na 0 mEq/L, K 50 mEq/L, Phos 12 mmol/L;  max acetate Add standard MVI and trace elements to TPN Decrease insulin in TPN to 109 units (*at max of 65 units/L) and add Semglee 30 SQ BID and titrate to CBG control  (plan discussed with CCM) Continue resistant q4h SSI and decrease as able  Next TPN labs 5/23 then on Mon/Thurs F/u triglyceride trend with increased lipids in [place], PharmD, BCPS, BCCCP Clinical Pharmacist  Please check AMION for all [place] phone numbers After 10:00 PM, call [place]

## 2022-04-04 NOTE — Progress Notes (Signed)
   NAME:  Gabrielle [name], MRN:  [id number], DOB:  04/28/1956, LOS: 62 ADMISSION DATE:  03/21/2022, CONSULTATION DATE:  03/22/22 REFERRING MD:  Unk Lightning, CHIEF COMPLAINT:  abd pain   History of Present Illness:  66 year old woman with hx of rectal cancer, DM2, HTN, HLD who presented for open aortobifemoral bypass by Dr. Unk Lightning for critical limb ischemia.  Postop course complicated by shock for which PCCM is consulted.  Patient underwent procedure with 14-7 bifurcated dacron graft, bilateral femoral endarterectomy late yesterday.  EBL 1.5L.  Currently c/o SOB and abdominal discomfort.  Pertinent  Medical History  DM2- A1c 9.3% HTN HLD Severe PVD  Significant Hospital Events: Including procedures, antibiotic start and stop dates in addition to other pertinent events   5/8  Aortobifemoral bypass using 14 x 7 bifurcated Dacron graft Bilateral femoral endarterectomy,  Left-sided profunda endarterectomy with bovine pericardial patch 5/9 PCCM consult for hypotension 5/10 worsening respiratory distress, intubated 5/11 diuresis 5/16 abdominal distention, increase bowel reg, attempts to wean from ventilator limited by low volumes 5/17 tachycardic, c/f STEMI, per cardiology no STEMI, PICC line placed for TPN 5/18 return of bowel function 5/20 extubated   Interim History / Subjective:  Waxing/waning alertness levels. NGT currently to gravity. Resp status stable. Not answering questions for me.  Objective   Blood pressure 128/65, pulse (!) 119, temperature 98 F (36.7 C), resp. rate 18, height '4\' 9"'$  (1.448 m), weight 57.8 kg, SpO2 99 %. CVP:  [3 mmHg-14 mmHg] 9 mmHg      Intake/Output Summary (Last 24 hours) at 04/04/2022 1157 Last data filed at 04/04/2022 0600 Gross per 24 hour  Intake 2591 ml  Output 1580 ml  Net 1011 ml    Filed Weights   03/31/22 0432 04/01/22 0500 04/02/22 0500  Weight: 58 kg 58 kg 57.8 kg    Examination: No distress Follows commands but will not talk to  me Shallow inspirations Abdomen soft but hypoactive BS Heart sounds tachy Dopplerable LE pulses Incision sites look good  Cr ok Sugars up   Resolved Hospital Problem list   Postoperative shock Postoperative ileus Acute hypoxemic respiratory failure AKI Acute metabolic encephalopathy/icu delirium Assessment & Plan:   Stress cardiomyopathy- resolved - Tele, asa/statin/bb - Can consider OP ischemic eval down the line  Hypernatremia- Patient with elevated sodium in the setting of recent diuresis and fluid losses.  DM2 with Hyperglycemia - Free water and insulin adjustments in TPN  PAD s/p Aortobifemoral bypass  HTN, HLD hx  Vascular surgery following. Surgical site clear, dry and intact, perfusion to lower extremities stable.  - continue statin, ASA - TF activity per primary  DM2 - still hyperglycemic - insulin in TPN and do levemir 30 units BID  Anemia of critical illness Stable  ICU delirium- reorientation, qHS seroquel, change oxycodone from standing to PRN, DC fent  Ileus- improving, continue bowel regimen, NG activity advancement per primary  Best Practice (right click and "Reselect all SmartList Selections" daily)   Diet/type: NPO on TPN.  DVT prophylaxis: heparin ppx GI prophylaxis: N/A Lines: Central line Foley:  Yes, and it is no longer needed and removal ordered  Code Status:  full code Last date of multidisciplinary goals of care discussion: per primary.  Glad to see her doing better, maybe CIR in near future.  Will follow while in ICU  Erskine Emery MD PCCM

## 2022-04-04 NOTE — Progress Notes (Signed)
Nutrition Follow-up  DOCUMENTATION CODES:   Severe malnutrition in context of acute illness/injury  INTERVENTION:   Recommend continuing TPN until pt TF initiated and pt demonstrating tolerance and TF being titrated to goal successfully   Continue TPN to meet 100% estimated nutrition needs   Monitor for ability to trial TF Tube Feeding Recommendations:  Vital AF 1.2 at 20 ml/hr; titrate by 10 mL q 8 hours until goal rate of 70 ml/hr Goal regimen provides 1872 kcals, 117 g of protein and 1264 mL of free water   Recommend addition of free water per Cortrak once ok for use by MD    NUTRITION DIAGNOSIS:   Severe Malnutrition related to acute illness as evidenced by mild fat depletion, moderate muscle depletion, energy intake < or equal to 50% for > or equal to 5 days.  Being addressed via TPN  GOAL:   Patient will meet greater than or equal to 90% of their needs  Met via nutrition support  MONITOR:   Vent status, Weight trends, Labs, TF tolerance  REASON FOR ASSESSMENT:   Consult, Ventilator Enteral/tube feeding initiation and management  ASSESSMENT:   66 yo female admitted for aortobifemoral bypass due to right-sided Rutherford 4 critical limb ischemia, left-sided Rutherford 3 critical limb ischemia.  Pt developed postop shock and respiratory failure requiring intubation.  PMH includes DM, HTN, HLD, severe PAD  5/08 Aortobifemoral bypass using 14 x 7 bifurcated Dacron graft, Bilateral femoral endarterectomy,  Left-sided profunda endarterectomy with bovine pericardial patch 5/09 PCCM consult for hypotension 5/10 Worsening respiratory status requiring intubation 5/11 Trickle TF initiated 5/12 Initiated TF titration 5/13 TF decreased back to trickle TF given abd distention: abd xray with dilated SB 5/17 TF continued at trickle TF rate until pt experienced large volume emesis with additional NG suction 1.5 L out, TPN recommended, Cortrak placed-gastric, unable to get  PP 5/18 TPN initiated 5/20 Extubated   Continues on TPN at 70 ml/hr providing 1778 kcals, 97 g of protein  NG tube removed by MD today. Noted possible initiation of trickle TF via [place] wound continues to evolve, WOC RN following, noted plan for hydrotherapy  Hypernatremia persists but trending down. Noted no IVF other than TPN, which is currently "not concentrated). Recommend free water per Cortrak once able  Noted TG level trending down  CBGs poorly controlled; noted insulin being decreased in TPN, levemir BID added, continues on sliding scale  Labs: sodium 149 (H), CBGs 201-277 (goal 140-180) Meds: colace, ss novolog levemir   Diet Order:   Diet Order             Diet NPO time specified  Diet effective now                   EDUCATION NEEDS:   Not appropriate for education at this time  Skin:  Skin Assessment: Skin Integrity Issues: Skin Integrity Issues:: Unstageable DTI: perineum Unstageable: sacrococcygeal: seen by WOC  Last BM:  5/21  Height:   Ht Readings from Last 1 Encounters:  03/24/22 4' 9"  (1.448 m)    Weight:   Wt Readings from Last 1 Encounters:  04/02/22 57.8 kg    BMI:  Body mass index is 27.57 kg/m.  Estimated Nutritional Needs:   Kcal:  1750-1950 kcals  Protein:  95-110 g  Fluid:  >/= 1.7 L  Kerman Passey MS, RDN, LDN, CNSC Registered Dietitian III Clinical Nutrition RD Pager and On-Call Pager Number Located in [place]

## 2022-04-05 ENCOUNTER — Other Ambulatory Visit (HOSPITAL_COMMUNITY): Payer: Self-pay

## 2022-04-05 ENCOUNTER — Inpatient Hospital Stay (HOSPITAL_COMMUNITY): Payer: BC Managed Care – PPO

## 2022-04-05 DIAGNOSIS — I5021 Acute systolic (congestive) heart failure: Secondary | ICD-10-CM | POA: Diagnosis not present

## 2022-04-05 LAB — APTT: aPTT: 29 seconds (ref 24–36)

## 2022-04-05 LAB — GLUCOSE, CAPILLARY
Glucose-Capillary: 195 mg/dL — ABNORMAL HIGH (ref 70–99)
Glucose-Capillary: 217 mg/dL — ABNORMAL HIGH (ref 70–99)
Glucose-Capillary: 170 mg/dL — ABNORMAL HIGH (ref 70–99)
Glucose-Capillary: 196 mg/dL — ABNORMAL HIGH (ref 70–99)
Glucose-Capillary: 229 mg/dL — ABNORMAL HIGH (ref 70–99)
Glucose-Capillary: 207 mg/dL — ABNORMAL HIGH (ref 70–99)

## 2022-04-05 LAB — BASIC METABOLIC PANEL
Anion gap: 3 — ABNORMAL LOW (ref 5–15)
BUN: 31 mg/dL — ABNORMAL HIGH (ref 8–23)
CO2: 26 mmol/L (ref 22–32)
Calcium: 9 mg/dL (ref 8.9–10.3)
Chloride: 116 mmol/L — ABNORMAL HIGH (ref 98–111)
Creatinine, Ser: 0.76 mg/dL (ref 0.44–1.00)
GFR, Estimated: >60 mL/min (ref >60)
Glucose, Bld: 206 mg/dL — ABNORMAL HIGH (ref 70–99)
Potassium: 4 mmol/L (ref 3.5–5.1)
Sodium: 145 mmol/L (ref 135–145)

## 2022-04-05 LAB — MAGNESIUM: Magnesium: 2.1 mg/dL (ref 1.7–2.4)

## 2022-04-05 LAB — PHOSPHORUS: Phosphorus: 2.4 mg/dL — ABNORMAL LOW (ref 2.5–4.6)

## 2022-04-05 MED ORDER — FREE WATER
100.0000 mL | Freq: Four times a day (QID) | Status: DC
  Administered 2022-04-05 – 2022-05-01 (×102): 100 mL

## 2022-04-05 MED ORDER — SILVER NITRATE-POT NITRATE 75-25 % EX MISC
1.0000 "application " | CUTANEOUS | Status: DC | PRN
  Filled 2022-04-05 (×2): qty 1

## 2022-04-05 MED ORDER — INSULIN DETEMIR 100 UNIT/ML ~~LOC~~ SOLN
35.0000 [IU] | Freq: Two times a day (BID) | SUBCUTANEOUS | Status: DC
  Administered 2022-04-05 (×2): 35 [IU] via SUBCUTANEOUS
  Filled 2022-04-05 (×4): qty 0.35

## 2022-04-05 MED ORDER — SPIRONOLACTONE 25 MG PO TABS
25.0000 mg | ORAL_TABLET | Freq: Every day | ORAL | Status: DC
  Filled 2022-04-05: qty 1

## 2022-04-05 MED ORDER — SACUBITRIL-VALSARTAN 49-51 MG PO TABS
1.0000 | ORAL_TABLET | Freq: Two times a day (BID) | ORAL | Status: DC
  Administered 2022-04-05: 1 via ORAL
  Filled 2022-04-05 (×3): qty 1

## 2022-04-05 MED ORDER — K PHOS MONO-SOD PHOS DI & MONO 155-852-130 MG PO TABS
250.0000 mg | ORAL_TABLET | Freq: Once | ORAL | Status: AC
  Administered 2022-04-05: 250 mg
  Filled 2022-04-05: qty 1

## 2022-04-05 MED ORDER — SPIRONOLACTONE 25 MG PO TABS
25.0000 mg | ORAL_TABLET | Freq: Every day | ORAL | Status: DC
  Administered 2022-04-05 – 2022-04-15 (×11): 25 mg
  Filled 2022-04-05 (×10): qty 1

## 2022-04-05 MED ORDER — K PHOS MONO-SOD PHOS DI & MONO 155-852-130 MG PO TABS: 250.0000 mg | ORAL_TABLET | Freq: Once | ORAL | Status: DC

## 2022-04-05 MED ORDER — VITAL AF 1.2 CAL PO LIQD
1000.0000 mL | ORAL | Status: DC
Start: 2022-04-05 — End: 2022-04-07
  Administered 2022-04-05 – 2022-04-06 (×2): 1000 mL

## 2022-04-05 MED ORDER — TRAMADOL HCL 50 MG PO TABS
50.0000 mg | ORAL_TABLET | Freq: Four times a day (QID) | ORAL | Status: DC | PRN
  Administered 2022-04-06 – 2022-04-07 (×2): 50 mg via ORAL
  Filled 2022-04-05 (×2): qty 1

## 2022-04-05 MED ORDER — TRAVASOL 10 % IV SOLN
INTRAVENOUS | Status: AC
  Filled 2022-04-05: qty 1008

## 2022-04-05 MED ORDER — VITAL HIGH PROTEIN PO LIQD: 1000.0000 mL | ORAL | Status: DC

## 2022-04-05 NOTE — Progress Notes (Signed)
[facility] Progress Note Patient Name: Gabrielle [name] DOB: 1956-08-22 MRN: [id number]   Date of Service  04/05/2022  HPI/Events of Note  RN requesting flexi for multiple loose stools in setting of stage 1 decubitus on buttocks  Patient has hx of rectal cancer.  Colonoscopy report in 2018 reports normal colon including rectum and sigmoid  eICU Interventions  OK to order flexiseal     Intervention Category Intermediate Interventions: Other:  Marisol Glazer Rodman Pickle 04/05/2022, 10:20 PM

## 2022-04-05 NOTE — Progress Notes (Signed)
 Nutrition Follow-up  DOCUMENTATION CODES:   Severe malnutrition in context of acute illness/injury  INTERVENTION:   Recommend continuing TPN until pt TF initiated and pt demonstrating tolerance and TF being titrated to goal successfully   Continue TPN to meet 100% estimated nutrition needs   Tube Feeding via Cortrak (post pyloric):  Vital AF 1.2 at 10 ml/hr (no titration) Goal rate of 70 ml/hr Goal regimen provides 1872 kcals, 117 g of protein and 1264 mL of free water   Once demonstrating tolerance of TF, plan to add  Juven BID, each packet provides 80 calories, 8 grams of carbohydrate, 2.5  grams of protein (collagen), 7 grams of L-arginine and 7 grams of L-glutamine; supplement contains CaHMB, Vitamins C, E, B12 and Zinc to promote wound healing  NUTRITION DIAGNOSIS:   Severe Malnutrition related to acute illness as evidenced by mild fat depletion, moderate muscle depletion, energy intake < or equal to 50% for > or equal to 5 days.  Being addressed via TPN  GOAL:   Patient will meet greater than or equal to 90% of their needs  Met via nutrition support  MONITOR:   Vent status, Weight trends, Labs, TF tolerance  REASON FOR ASSESSMENT:   Consult, Ventilator Enteral/tube feeding initiation and management  ASSESSMENT:   66 yo female admitted for aortobifemoral bypass due to right-sided Rutherford 4 critical limb ischemia, left-sided Rutherford 3 critical limb ischemia.  Pt developed postop shock and respiratory failure requiring intubation.  PMH includes DM, HTN, HLD, severe PAD  5/08 Aortobifemoral bypass using 14 x 7 bifurcated Dacron graft, Bilateral femoral endarterectomy,  Left-sided profunda endarterectomy with bovine pericardial patch 5/09 PCCM consult for hypotension 5/10 Worsening respiratory status requiring intubation 5/11 Trickle TF initiated 5/12 Initiated TF titration 5/13 TF decreased back to trickle TF given abd distention: abd xray with dilated  SB 5/17 TF continued at trickle TF rate until pt experienced large volume emesis with additional NG suction 1.5 L out, TPN recommended, Cortrak placed-gastric, unable to get PP 5/18 TPN initiated 5/20 Extubated  5/23 Abd xray with decreasing distention in SB and colon, Cortrak with tip in duodenum  Continues on TPN at 70 ml/hr providing 1778 kcals, 97 g of protein  RD received consult to start trickle TF today via [place] initiated today; sacral wound 7 cm x 12 cm x 0 cm on initiation  Hypernatremia continuing to improve, current level 145 (wdl)  Noted TG level trending down  CBGs poorly controlled; noted insulin being decreased in TPN, levemir BID added, continues on sliding scale  Labs: sodium 145 (wdl), CBGs 195-276 (goal 140-180) Meds: colace, ss novolog levemir   Diet Order:   Diet Order             Diet NPO time specified  Diet effective now                   EDUCATION NEEDS:   Not appropriate for education at this time  Skin:  Skin Assessment: Skin Integrity Issues: Skin Integrity Issues:: Unstageable DTI: perineum Unstageable: sacrococcygeal: seen by WOC  Last BM:  5/23  Height:   Ht Readings from Last 1 Encounters:  03/24/22 _0  (1.448 m)    Weight:   Wt Readings from Last 1 Encounters:  04/05/22 59.4 kg    BMI:  Body mass index is 28.34 kg/m.  Estimated Nutritional Needs:   Kcal:  1750-1950 kcals  Protein:  95-110 g  Fluid:  >/= 1.7 L    Rico Junker MS, RDN, LDN, CNSC Registered Dietitian III Clinical Nutrition RD Pager and On-Call Pager Number Located in [place]

## 2022-04-05 NOTE — Progress Notes (Signed)
Progress Note  Patient Name: Gabrielle [name] Date of Encounter: 04/05/2022  [facility] Cardiologist: Janina Mayo, MD   Subjective   Making slow progress. No complaints  Inpatient Medications    Scheduled Meds:  aspirin  81 mg Per Tube Daily   bacitracin   Topical TID   carvedilol  6.25 mg Per Tube BID WC   Chlorhexidine Gluconate Cloth  6 each Topical Daily   docusate  100 mg Per Tube BID   free water  100 mL Per Tube Q6H   heparin injection (subcutaneous)  5,000 Units Subcutaneous Q8H   insulin aspart  0-20 Units Subcutaneous Q4H   insulin detemir  35 Units Subcutaneous BID   leptospermum manuka honey  1 application. Topical Daily   losartan  25 mg Per Tube Daily   mouth rinse  15 mL Mouth Rinse BID   pantoprazole (PROTONIX) IV  40 mg Intravenous QHS   polyethylene glycol  17 g Per Tube Daily   QUEtiapine  25 mg Per Tube QHS   rosuvastatin  40 mg Per Tube Daily   sodium chloride flush  10-40 mL Intracatheter Q12H   spironolactone  25 mg Per Tube Daily   Continuous Infusions:  sodium chloride 10 mL/hr at 04/05/22 0700   feeding supplement (VITAL AF 1.2 CAL) 1,000 mL (04/05/22 1012)   magnesium sulfate bolus IVPB     TPN ADULT (ION) 70 mL/hr at 04/05/22 0700   TPN ADULT (ION)     PRN Meds: sodium chloride, acetaminophen (TYLENOL) oral liquid 160 mg/5 mL, bisacodyl, guaiFENesin-dextromethorphan, hydrALAZINE, labetalol, lip balm, magnesium sulfate bolus IVPB, ondansetron, oxyCODONE, phenol, sodium chloride flush, traMADol   Vital Signs    Vitals:   04/05/22 0500 04/05/22 0600 04/05/22 0700 04/05/22 1009  BP: (!) 151/63 (!) 154/69 (!) 150/71 (!) 176/80  Pulse: (!) 112 (!) 114 (!) 111 (!) 115  Resp: (!) 30 (!) 30 (!) 30   Temp:      TempSrc:      SpO2: 98% 98% 96%   Weight:  59.4 kg    Height:        Intake/Output Summary (Last 24 hours) at 04/05/2022 1139 Last data filed at 04/05/2022 0700 Gross per 24 hour  Intake 1727.79 ml  Output 1250 ml  Net  477.79 ml      04/05/2022    6:00 AM 04/02/2022    5:00 AM 04/01/2022    5:00 AM  Last 3 Weights  Weight (lbs) 130 lb 15.3 oz 127 lb 6.8 oz 127 lb 13.9 oz  Weight (kg) 59.4 kg 57.8 kg 58 kg      Telemetry    No adverse arrhythmias, tachy- Personally Reviewed  ECG    Sinus rhythm with nonspecific T wave changes on 03/31/2022- Personally Reviewed  Physical Exam   GEN: Appears comfortable Neck: No JVD Cardiac: Tachy reg, no murmurs, rubs, or gallops.  Respiratory: Clear to auscultation bilaterally. GI: Soft, nontender, non-distended  MS: No edema; No deformity.  Globally weak and Neuro:  Nonfocal  Psych: Mildly delirious, mildly improved  Labs    High Sensitivity Troponin:   Recent Labs  Lab 03/23/22 1138 03/24/22 0843 03/25/22 1037 03/30/22 0057 03/30/22 0307  TROPONINIHS 3,592* 5,007* 2,820* 347* 316*     Chemistry Recent Labs  Lab 03/31/22 0735 04/01/22 0440 04/01/22 0751 04/02/22 0217 04/03/22 0525 04/05/22 0345  NA 148* 150*   < > 149* 149* 145  K 3.8 4.1   < > 4.2  4.1 4.0  CL 117* 118*   < > 119* 119* 116*  CO2 26 28   < > '26 27 26  '$ GLUCOSE 129* 365*   < > 235* 261* 206*  BUN 38* 37*   < > 33* 26* 31*  CREATININE 1.27* 1.14*   < > 0.98 0.78 0.76  CALCIUM 8.6* 8.9   < > 9.1 9.3 9.0  MG 2.5* 2.6*   < > 2.2 1.9 2.1  PROT 6.2* 6.3*  --   --   --   --   ALBUMIN 2.7* 2.5*  --   --   --   --   AST 33 28  --   --   --   --   ALT 22 19  --   --   --   --   ALKPHOS 126 112  --   --   --   --   BILITOT 0.9 1.0  --   --   --   --   GFRNONAA 47* 53*   < > >60 >60 >60  ANIONGAP 5 4*   < > 4* 3* 3*   < > = values in this interval not displayed.    Lipids  Recent Labs  Lab 04/02/22 0217  TRIG 215*    Hematology Recent Labs  Lab 03/30/22 0307 03/31/22 0425 04/02/22 0217  WBC 15.8* 14.9* 16.6*  RBC 3.70* 2.87* 3.03*  HGB 10.9* 8.3* 8.8*  HCT 34.6* 27.4* 29.1*  MCV 93.5 95.5 96.0  MCH 29.5 28.9 29.0  MCHC 31.5 30.3 30.2  RDW 14.8 14.3 14.4  PLT  342 272 354   Thyroid No results for input(s): TSH, FREET4 in the last 168 hours.  BNP Recent Labs  Lab 03/30/22 0307  BNP 559.7*    DDimer No results for input(s): DDIMER in the last 168 hours.   Radiology    DG Abd Portable 1V  Result Date: 04/05/2022 CLINICAL DATA:  Abdominal distension EXAM: PORTABLE ABDOMEN - 1 VIEW COMPARISON:  Radiograph 03/30/2022 FINDINGS: Feeding tube tip overlies the descending duodenum. Removal of nasogastric tube. There are few prominent loops of small bowel in the left hemiabdomen, overall decreased in distension in comparison prior. There is also decreased distention of the colon. IMPRESSION: Decreased distention of small bowel and colon, with some persistent mildly distended small bowel loops in the left hemiabdomen. Feeding tube tip overlies the descending duodenum. Electronically Signed   By: Maurine Simmering M.D.   On: 04/05/2022 08:51    Cardiac Studies   ECHO limited 04/03/2022:   1. Left ventricular ejection fraction, by estimation, is 55 to 60%. The  left ventricle has normal function. The left ventricle has no regional  wall motion abnormalities. Left ventricular diastolic parameters are  consistent with Grade I diastolic  dysfunction (impaired relaxation).   2. Right ventricular systolic function is normal. The right ventricular  size is normal.   3. The mitral valve is normal in structure. No evidence of mitral valve  regurgitation. No evidence of mitral stenosis.   4. The aortic valve is normal in structure. Aortic valve regurgitation is  not visualized. Aortic valve sclerosis is present, with no evidence of  aortic valve stenosis.   5. The inferior vena cava is normal in size with greater than 50%  respiratory variability, suggesting right atrial pressure of 3 mmHg.   Comparison(s): Prior images reviewed side by side. The left ventricular  function has improved. The left ventricular diastolic function has  improved, previously 20 to 25%.    Patient Profile     66 y.o. female  with PAD presenting with critical limb ischemia, poorly controlled diabetes mellitus, hyperlipidemia, hypertension developing acute systolic heart failure and cardiogenic shock roughly 48 hours following aortobifemoral bypass surgery and bilateral femoral enterectomy, left-sided profunda endarterectomy, performed 03/21/2022.  Echocardiogram showed acute reduction in LVEF from 55 to 60% to 20-25% from 1 day to the next 05/09 - 05/10.  Limited echo on 04/03/2022 shows normal ejection fraction 55 to 60%  Assessment & Plan    Cardiogenic shock: Resolved. Hemodynamically stable without pressors, in fact hypertensive this morning.  Her cardiogenic shock/acute systolic heart failure was likely secondary to Takotsubo cardiomyopathy or stress-induced cardiomyopathy.  Repeat echocardiogram on 04/03/2022 shows normalized ejection fraction up from 20 to 25% to 55 to 60%.  Excellent recovery.   I do not feel strongly that we need to proceed with cardiac catheterization at this time given her marked improvement/normalization of ejection fraction currently. CHF, acute systolic heart failure: General impression is that she is hypovolemic with hypernatremia and low CVP yesterday, today CVP = 9.  Oxygenating well.  Limited echocardiogram showed resolution of EF.  ARB has been added. Since BP elevated now I will change to Entresto 49/51 PO BID.   I we will add carvedilol 6.25 mg twice a day.  Prior to admission she was receiving amlodipine and lisinopril (has been off for several days now) for hypertension.  Chronically on Farxiga for diabetes. AKI/Hypernatremia: Renal parameters are back to normal hypernatremia has improved.  Suspect she needs more volume/free water. Acute respiratory failure: Successfully extubated  For questions or updates, please contact [place] Please consult [email or web address] for contact info under        Signed, Candee Furbish, MD  04/05/2022, 11:39 AM

## 2022-04-05 NOTE — Progress Notes (Signed)
[facility] [place] PROGRESS NOTE  ASSESSMENT / PLAN: Gabrielle [name] is a 66 y.o. female status post aortobifemoral bypass 01/13/34, complicated by postoperative myocardial infarction and heart failure.  She has required prolonged intubation and mechanical ventilation.  Overall improving. Extubated, minimal cough effort, some delirium, but improved.  Not speaking but following commands.  Abd soft, can advance TF to trickle, continue TPN until TF at goal Appreciate ICU help with TPN, electrolytes.  EF improved - cards recommending holding on catheterization  Sacral ulcer seen today. Not stageable- appreciate WOCN help and continued turns. Could possibly need debridement in the future Pt not medically appropriate for progressive due to current problems.  Can transfer to another ICU should heart beds be needed.   Would like consideration for all pain meds and others meds that could be involved in delirium to be weaned or held. I Aggressive PT, OT, OOB to chair.   Husband updated via phone.   SUBJECTIVE: No events overnight.  Resting comfortably.  OBJECTIVE: BP (!) 150/71   Pulse (!) 111   Temp 98.1 F (36.7 C) (Axillary)   Resp (!) 30   Ht '4\' 9"'$  (1.448 m)   Wt 59.4 kg   SpO2 96%   BMI 28.34 kg/m   Intake/Output Summary (Last 24 hours) at 04/05/2022 5732 Last data filed at 04/05/2022 0700 Gross per 24 hour  Intake 1967.88 ml  Output 1330 ml  Net 637.88 ml     Constitutional: Well-appearing woman in no acute distress.  Intubated and sedated. CNS: Awake and alert. Globally weak. Cardiac: Regular rate and rhythm. Pulmonary: Unlabored Abdomen: Soft abdomen.  Mild distention.  Nontender.  Midline incision healing well. Vascular: Groin incisions healing well.  Good Doppler flow in the dorsalis pedis arteries bilaterally.     Latest Ref Rng & Units 04/02/2022    2:17 AM 03/31/2022    4:25 AM 03/30/2022    3:07 AM  CBC  WBC 4.0 - 10.5 K/uL 16.6   14.9    15.8    Hemoglobin 12.0 - 15.0 g/dL 8.8   8.3   10.9    Hematocrit 36.0 - 46.0 % 29.1   27.4   34.6    Platelets 150 - 400 K/uL 354   272   342          Latest Ref Rng & Units 04/05/2022    3:45 AM 04/03/2022    5:25 AM 04/02/2022    2:17 AM  CMP  Glucose 70 - 99 mg/dL 206   261   235    BUN 8 - 23 mg/dL 31   26   33    Creatinine 0.44 - 1.00 mg/dL 0.76   0.78   0.98    Sodium 135 - 145 mmol/L 145   149   149    Potassium 3.5 - 5.1 mmol/L 4.0   4.1   4.2    Chloride 98 - 111 mmol/L 116   119   119    CO2 22 - 32 mmol/L '26   27   26    '$ Calcium 8.9 - 10.3 mg/dL 9.0   9.3   9.1      Estimated Creatinine Clearance: 51.9 mL/min (by C-G formula based on SCr of 0.76 mg/dL).  Broadus John MD [facility] Phone Number: [phone] 04/05/2022 8:39 AM

## 2022-04-05 NOTE — Progress Notes (Signed)
Physical Therapy Wound Treatment Patient Details  Name: Gabrielle [name] MRN: [id number] Date of Birth: 02-05-1956  Today's Date: 04/05/2022 Time: 9357-0177 Time Calculation (min): 54 min  Subjective  Subjective Assessment Subjective: pt nodding/shaking head to answer question/agree/disagree. Patient and Family Stated Goals: not stated Date of Onset:  (unknown) Prior Treatments:  (unknown)  Pain Score:  0/10 premedicated  Wound Assessment  Pressure Injury 03/30/22 Sacrum Posterior;Right;Left Deep Tissue Pressure Injury - Purple or maroon localized area of discolored intact skin or blood-filled blister due to damage of underlying soft tissue from pressure and/or shear. large deep tissue inj (Active)  Wound Image   04/05/22 1232  Dressing Type Foam - Lift dressing to assess site every shift;[place]comb 04/05/22 1232  Dressing Clean, Dry, Intact 04/05/22 1232  Dressing Change Frequency PRN 04/05/22 1232  State of Healing Eschar 04/05/22 1232  Site / Wound Assessment Brown;Yellow 04/05/22 1232  % Wound base Red or Granulating 0% 04/05/22 1232  % Wound base Yellow/Fibrinous Exudate 0% 04/05/22 1232  % Wound base Black/Eschar 100% 04/05/22 1232  % Wound base Other/Granulation Tissue (Comment) 0% 04/05/22 1232  Peri-wound Assessment Purple;Erythema (non-blanchable) 04/05/22 1232  Wound Length (cm) 7 cm 04/05/22 1232  Wound Width (cm) 12 cm 04/05/22 1232  Wound Depth (cm) 0 cm 04/05/22 1232  Wound Surface Area (cm^2) 84 cm^2 04/05/22 1232  Wound Volume (cm^3) 0 cm^3 04/05/22 1232  Margins Unattached edges (unapproximated) 04/05/22 1232  Drainage Amount Minimal 04/05/22 1232  Drainage Description Sanguineous 04/05/22 1232  Treatment Cleansed;Debridement (Selective);Hydrotherapy (Pulse lavage) 04/05/22 1232      Hydrotherapy Pulsed lavage therapy - wound location: sacral Pulsed Lavage with Suction (psi): 8 psi (to 12 psi) Pulsed Lavage with Suction - Normal Saline Used: 1000 mL Pulsed  Lavage Tip: Tip with splash shield Selective Debridement Selective Debridement - Location: sacral Selective Debridement - Tools Used: Forceps, Scalpel Selective Debridement - Tissue Removed: Upon removing each section of leatery eschar, the wound bed bled copiously, so ended up cross-hatching the whole wound.    Wound Assessment and Plan  Wound Therapy - Assess/Plan/Recommendations Wound Therapy - Clinical Statement: pt will likely benefit from hydrotherapy to assist softening and selectively debrieding the sacral wound.  Pulsed lavage will help decrease the bacterial burden during the debridement process. Wound Therapy - Functional Problem List: functional debility after prolonged time in bed. Factors Delaying/Impairing Wound Healing: Immobility, Multiple medical problems, Vascular compromise, Diabetes Mellitus Hydrotherapy Plan: Dressing change, Debridement, Pulsatile lavage with suction, Patient/family education Wound Therapy - Frequency: 3X / week Wound Therapy - Current Recommendations: PT Wound Therapy - Follow Up Recommendations: dressing changes by RN, dressing changes by family/patient  Wound Therapy Goals- Improve the function of patient's integumentary system by progressing the wound(s) through the phases of wound healing (inflammation - proliferation - remodeling) by: Wound Therapy Goals - Improve the function of patient's integumentary system by progressing the wound(s) through the phases of wound healing by: Decrease Necrotic Tissue to: 10 percent Decrease Necrotic Tissue - Progress: Goal set today Increase Granulation Tissue to: 90% Increase Granulation Tissue - Progress: Goal set today Improve Drainage Characteristics: Min, Serous Improve Drainage Characteristics - Progress: Goal set today Goals/treatment plan/discharge plan were made with and agreed upon by patient/family: Yes Time For Goal Achievement: 7 days Wound Therapy - Potential for Goals: Good  Goals will be  updated until maximal potential achieved or discharge criteria met.  Discharge criteria: when goals achieved, discharge from hospital, MD decision/surgical intervention, no progress towards goals, refusal/missing three consecutive  treatments without notification or medical reason.  GP     Charges PT Wound Care Charges $Wound Debridement up to 20 cm: < or equal to 20 cm $ Wound Debridement each add'l 20 sqcm: 4 $PT PLS Gun and Tip: 1 Supply $PT Hydrotherapy Visit: 1 Visit       Tessie Fass Renard Caperton 04/05/2022, 12:43 PM

## 2022-04-05 NOTE — Progress Notes (Signed)
PHARMACY - TOTAL PARENTERAL NUTRITION CONSULT NOTE   Indication: Prolonged ileus  Patient Measurements: Height: '4\' 9"'$  (144.8 cm) Weight: 59.4 kg (130 lb 15.3 oz) IBW/kg (Calculated) : 38.6 TPN AdjBW (KG): 40.6 Body mass index is 28.34 kg/m. Usual Weight: ~118 lbs per chart review   Assessment:  66 yo W admitted with BL LE critical limb ischemia s/p aortofemoral bypass, BL femoral endarterectomy and Left-sided profunda endarterectomy with bovine pericardial patch 5/8. Patient with cardiogenic shock post-procedure now fluid overloaded. Patient with return of bowel function and multiple large stools 5/17, however, continues to have emesis and high NGT output. Imagining consistent with prolonged ileus. Patient received some trickle TF 5/11-5/13 and 5/16. While on TF (received up to 76g dextrose/24hr), patient required 41- 60 units of insulin. Patient is at risk for refeeding. Pharmacy consulted for TPN.   Of note, triglycerides are elevated at baseline (have been as high as 499 this admission while on propofol) but has been stable since TPN start. Per team, pt appears dry with Na elevated and CVP low. Ok to liberalize TPN fluids.   Glucose / Insulin: A1C 9.2. CBGs 195-235, used 26 units SSI with Semglee 30 BID + 109 units insulin in TPN (Max allowed in TPN)  Electrolytes: Na 145 - FW, Cl 116, CoCa 10.2, others wnl Renal: Scr 0.78, BUN 31 Hepatic: LFT/Tbili wnl, albumin 2.5, TG 215 (stable) Intake / Output; MIVF: UOP 0.9 ml/kg/d, NGT removed 5/22, Cortrak in place, LBM 5/22 (x4) GI Imaging: 5/8 KUB: unremarkable 5/13 KUB: increased small bowel gas, ileus vs obstruction 5/14 KUB: ileus vs SBO  5/15 KUB: Similar gaseous distension of small bowel likely resolving ileus  5/17 KUB: Similar small bowel and proximal colonic distension consistent with postoperative ileus  GI Surgeries / Procedures:  5/17 NGT placement   Central access: PICC 5/17  TPN start date: 5/18  Nutritional Goals: Goal  TPN rate (non-concentrated) is 70 mL/hr, 57 g/L of 10% protein, 15% dextrose, 30 g/L lipids (provides 97 g of protein, 252 g dextrose, 54 g lipids and 1778 kcals per day)  RD Assessment: Estimated Needs Total Energy Estimated Needs: 1750-1950 kcals Total Protein Estimated Needs: 95-110 g Total Fluid Estimated Needs: >/= 1.7 L  Current Nutrition:  TPN and Vital AF 1.2 at 10 ml/hr  Plan:  Continue TPN at goal rate of 70 mL/hr at 1800 (provides 100.8 g protein, 1751 kcals, meeting 100% of estimated needs)  Electrolytes in TPN: Continue Mg 8 mEq/L; Remove Ca; Continue Na 0 mEq/L, K 50 mEq/L, Phos 12 mmol/L;  max acetate Add standard MVI and trace elements to TPN Decrease insulin in TPN to 109 units (*at max of 65 units/L) and Semglee 35 SQ BID and titrate to CBG control  (plan discussed with CCM) Continue resistant q4h SSI  TPN las Mon/Thurs F/u triglyceride trend with increased lipids in TPN Follow-up tube feed toleration and titration and ability to wean TPN  Sloan Leiter, PharmD, BCPS, BCCCP Clinical Pharmacist  Please check AMION for all [place] phone numbers After 10:00 PM, call [place]

## 2022-04-05 NOTE — TOC Benefit Eligibility Note (Signed)
Patient Advocate Encounter   Received notification that prior authorization for Entresto 49-'51MG'$  tablets is required.   PA submitted on 04/05/2022 [place] [id number] Status is pending       Lyndel Safe, [place] Patient Advocate Specialist [place] Patient Advocate Team Direct Number: [phone]  Fax: [phone]

## 2022-04-05 NOTE — Progress Notes (Addendum)
NAME:  Gabrielle [name], MRN:  [id number], DOB:  1956-06-29, LOS: 25 ADMISSION DATE:  03/21/2022, CONSULTATION DATE:  03/22/22 REFERRING MD:  Unk Lightning, CHIEF COMPLAINT:  abd pain   History of Present Illness:  66 year old woman with hx of rectal cancer, DM2, HTN, HLD who presented for open aortobifemoral bypass by Dr. Unk Lightning for critical limb ischemia.  Postop course complicated by shock in the setting of stress cardiomyopathy.  Patient underwent procedure with 14-7 bifurcated dacron graft, bilateral femoral endarterectomy late 5/8pm.  EBL 1.5L.  Currently c/o SOB and abdominal discomfort. Developed respiratory distress 5/10, intubated.  ICU course complicated by ileus requiring TPN with malnutrition.  She was successfully extubated 5/20.    Pertinent  Medical History  DM2- A1c 9.3% HTN HLD Severe PVD  Significant Hospital Events: Including procedures, antibiotic start and stop dates in addition to other pertinent events   5/8 Admit, Aortobifemoral bypass using 14 x 7 bifurcated Dacron graft, Bilateral femoral endarterectomy, Left-sided profunda endarterectomy with bovine pericardial patch 5/9 PCCM consult for hypotension 5/10 worsening respiratory distress, intubated 5/11 diuresis 5/16 abdominal distention, increase bowel reg, attempts to wean from ventilator limited by low volumes 5/17 tachycardic, c/f STEMI, per cardiology no STEMI, PICC line placed for TPN 5/18 return of bowel function 5/20 extubated 5/22 Waxing/waning alertness, small bore feeding tube. Not answering questions.    Interim History / Subjective:  Afebrile BP slightly elevated RN reports sacral wound with dark area, improved mental status this am Pt received '15mg'$  oxycodone in last 24 hours Glucose range 195-217  Objective   Blood pressure (!) 150/71, pulse (!) 111, temperature 98.1 F (36.7 C), temperature source Axillary, resp. rate (!) 30, height '4\' 9"'$  (1.448 m), weight 59.4 kg, SpO2 96 %. CVP:  [2 mmHg-9 mmHg] 2  mmHg      Intake/Output Summary (Last 24 hours) at 04/05/2022 0748 Last data filed at 04/05/2022 0700 Gross per 24 hour  Intake 2127.81 ml  Output 1330 ml  Net 797.81 ml   Filed Weights   04/01/22 0500 04/02/22 0500 04/05/22 0600  Weight: 58 kg 57.8 kg 59.4 kg    Examination: General: adult female sitting up in bed in NAD HEENT: MM pink/moist, anicteric, small bore feeding tube in place, pupils =/reactive Neuro: Awake/alert, raspy voice but able to state , follows commands / nods yes & no to questions CV: s1s2 RRR, ST on monitor, no m/r/g PULM: non-labored at rest on [place] O2, lungs bilaterally with good air entry GI: soft, bsx4 active  Extremities: warm/dry, no edema  Skin: sacral lesion as below       Resolved Hospital Problem list   Postoperative shock Postoperative ileus Acute hypoxemic respiratory failure AKI Acute metabolic encephalopathy/icu delirium Assessment & Plan:   Stress Cardiomyopathy with Cardiogenic Shock - resolved Acute Systolic Heart Failure  -continue tele monitoring  -appreciate Cardiology assistance with patient care > rec's for no LHC at this time given marked improvement in EF  -continue ASA, statin, coreg  -defer decision regarding outpatient ischemic eval to Cardiology  -begin spironolactone   Hypernatremia   Patient with elevated sodium in the setting of recent diuresis and fluid losses.  -Na improved  -free water PT 100 ml Q6  Hypophosphatemia -replace phos PT x1 (on TPN)  DM2 with Hyperglycemia -increase levemir to 35 units BID  -SSI, resistant scale   PAD s/p Aortobifemoral bypass  HTN, HLD hx  -post operative care per VVS  -ASA, statin -PT efforts   Anemia  of critical illness -trend CBC   ICU Delirium -suspect delirium may have been "treated" with oxycodone, reduce oxy to ultram for pain control  -promote sleep/wake cycle -PT/OT efforts  -hopeful for CIR in near future   Ileus -assess KUB  -begin trickle TF   -continue bowel regimen   Sacral Tissue Injury  -WOC consult  -continue medihoney -pressure relieving measures, may need hydrotherapy  Best Practice (right click and "Reselect all SmartList Selections" daily)  Diet/type: NPO on TPN.  DVT prophylaxis: heparin ppx GI prophylaxis: N/A Lines: Central line Foley:  N/A Code Status:  full code Last date of multidisciplinary goals of care discussion: per primary.       Noe Gens, MSN, APRN, NP-C, AGACNP-BC [facility] 04/05/2022, 7:48 AM   Please see [email or web address] for pager details.   From 7A-7P if no response, please call [phone] After hours, please call ELink [phone]

## 2022-04-05 NOTE — Consult Note (Signed)
WOC Nurse Consult Note: Patient receiving care in [place]. Reason for Consult: sacral wound Please see the note and orders for this wound entered yesterday by [facility], [place] nurse. No new needs identified. Val Riles, RN, MSN, CWOCN, CNS-BC, pager [phone]

## 2022-04-05 NOTE — TOC Benefit Eligibility Note (Signed)
Patient Teacher, English as a foreign language completed.    The patient is currently admitted and upon discharge could be taking Entresto 24-26 mg.  Requires Prior Authorization  The patient is insured through [place], [place] Patient [place] Patient Advocate Team Direct Number: [phone]  Fax: [phone]

## 2022-04-05 NOTE — Progress Notes (Signed)
Physical Therapy Treatment Patient Details Name: Gabrielle [name] MRN: [id number] DOB: [birth year] Today's Date: 04/05/2022   History of Present Illness Patient is a 66 y/o female who presents on 5/8 for open aortobifemoral bypass and Bilateral femoral endarterectomy complicated by MI, heart failure and shock, intubated 5/10-5/20. PMH includes rectal cancer, DM2, HTN.    PT Comments    Patient progressing slowly towards PT goals. Session focused on EOB and OOB transfer to chair for the first time. Requires total A of 2 for all mobility with little initiation of movement from patient. No verbalizations during session today despite encouragement. Nodding occasionally to questions asked.  Difficult to fully assess cognition but likely demonstrates deficits relating to attention, awareness, safety and problem solving. Pt able to sit EOB with total A with flexed posture in a posterior pelvic tilt with no balance reactions. Able to maintain Sp02 on RA throughout transfer in high 90s so kept 02 off at end of session. RN aware. Recommend using lift to transfer pt back to bed. Continues to be appropriate for SNF. Will follow.   Recommendations for follow up therapy are one component of a multi-disciplinary discharge planning process, led by the attending physician.  Recommendations may be updated based on patient status, additional functional criteria and insurance authorization.  Follow Up Recommendations  Skilled nursing-short term rehab (<3 hours/day)     Assistance Recommended at Discharge Frequent or constant Supervision/Assistance  Patient can return home with the following Two people to help with walking and/or transfers;Two people to help with bathing/dressing/bathroom;Assistance with cooking/housework;Assist for transportation;Direct supervision/assist for financial management;Help with stairs or ramp for entrance;Direct supervision/assist for medications management   Equipment Recommendations   Wheelchair (measurements PT);Wheelchair cushion (measurements PT);Hospital bed    Recommendations for Other Services       Precautions / Restrictions Precautions Precautions: Fall;Other (comment) Precaution Comments: NG tube, bil mitts Restrictions Weight Bearing Restrictions: No     Mobility  Bed Mobility Overal bed mobility: Needs Assistance Bed Mobility: Rolling, Sidelying to Sit Rolling: Total assist, +2 for physical assistance Sidelying to sit: Total assist, +2 for physical assistance, HOB elevated       General bed mobility comments: Rolling to left/right with total A for pericare, very little initiation of movement to assist. Total A to bring LEs to EOB and elevate trunk as well as scoot hips forward. Legs not able to touch floor due to short stature.    Transfers Overall transfer level: Needs assistance Equipment used: 2 person hand held assist Transfers: Sit to/from Stand, Bed to chair/wheelchair/BSC Sit to Stand: Total assist     Squat pivot transfers: Total assist, +2 physical assistance     General transfer comment: Total A of 2 to stand and squat pivot to chair towards left side, little to no assist from patient. Requires mutliple attempts to stand to clear bottom to get onto chair, not ideal height due to height of pt.    Ambulation/Gait               General Gait Details: Unable   Stairs             Wheelchair Mobility    Modified Rankin (Stroke Patients Only)       Balance Overall balance assessment: Needs assistance Sitting-balance support: Feet unsupported, Bilateral upper extremity supported Sitting balance-Leahy Scale: Zero Sitting balance - Comments: Assist to maintain balance, no balance reactions noted. Postural control: Posterior lean Standing balance support: During functional activity Standing balance-Leahy Scale: Zero  Cognition Arousal/Alertness: Awake/alert Behavior During  Therapy: Flat affect Overall Cognitive Status: Difficult to assess Area of Impairment: Following commands, Attention, Problem solving, Safety/judgement                   Current Attention Level: Focused   Following Commands: Follows one step commands inconsistently, Follows one step commands with increased time Safety/Judgement: Decreased awareness of safety, Decreased awareness of deficits   Problem Solving: Slow processing, Decreased initiation, Requires verbal cues, Requires tactile cues General Comments: Pt not verbal throughout session, nodding yes or no occasionally to questions asked. Follows a few simple commands but inconsistently. Poor attention. Nods yes to remembering this therapist.        Exercises      General Comments General comments (skin integrity, edema, etc.): Family stepped out of room for session. Pt found sitting in stool so assisted with cleaning up patient. VSS on RA. Left on RA at end of session. RN notified.      Pertinent Vitals/Pain Pain Assessment Pain Assessment: Faces Faces Pain Scale: Hurts even more Pain Location: generalized with movement, likely bottom due to sore Pain Descriptors / Indicators: Grimacing, Guarding, Discomfort Pain Intervention(s): Repositioned, Monitored during session    Home Living                          Prior Function            PT Goals (current goals can now be found in the care plan section) Progress towards PT goals: Progressing toward goals (slowly)    Frequency    Min 2X/week      PT Plan Current plan remains appropriate    Co-evaluation PT/OT/SLP Co-Evaluation/Treatment: Yes Reason for Co-Treatment: Complexity of the patient's impairments (multi-system involvement);Necessary to address cognition/behavior during functional activity;For patient/therapist safety;To address functional/ADL transfers PT goals addressed during session: Mobility/safety with  mobility;Balance;Strengthening/ROM        AM-PAC PT "6 Clicks" Mobility   Outcome Measure  Help needed turning from your back to your side while in a flat bed without using bedrails?: Total Help needed moving from lying on your back to sitting on the side of a flat bed without using bedrails?: Total Help needed moving to and from a bed to a chair (including a wheelchair)?: Total Help needed standing up from a chair using your arms (e.g., wheelchair or bedside chair)?: Total Help needed to walk in hospital room?: Total Help needed climbing 3-5 steps with a railing? : Total 6 Click Score: 6    End of Session   Activity Tolerance: Patient tolerated treatment well;Patient limited by fatigue Patient left: in chair;with call bell/phone within reach;with chair alarm set Nurse Communication: Mobility status;Need for lift equipment PT Visit Diagnosis: Muscle weakness (generalized) (M62.81)     Time: 5093-2671 PT Time Calculation (min) (ACUTE ONLY): 37 min  Charges:  $Therapeutic Activity: 8-22 mins                     Marisa Severin, PT, DPT Acute Rehabilitation Services Secure chat preferred Office [phone]      Marguarite Arbour A Sabra Heck 04/05/2022, 3:54 PM

## 2022-04-06 ENCOUNTER — Inpatient Hospital Stay (HOSPITAL_COMMUNITY): Payer: BC Managed Care – PPO

## 2022-04-06 ENCOUNTER — Other Ambulatory Visit (HOSPITAL_COMMUNITY): Payer: Self-pay

## 2022-04-06 DIAGNOSIS — I5181 Takotsubo syndrome: Secondary | ICD-10-CM

## 2022-04-06 DIAGNOSIS — I5021 Acute systolic (congestive) heart failure: Secondary | ICD-10-CM | POA: Diagnosis not present

## 2022-04-06 DIAGNOSIS — I21A1 Myocardial infarction type 2: Secondary | ICD-10-CM

## 2022-04-06 DIAGNOSIS — L89159 Pressure ulcer of sacral region, unspecified stage: Secondary | ICD-10-CM

## 2022-04-06 DIAGNOSIS — I10 Essential (primary) hypertension: Secondary | ICD-10-CM

## 2022-04-06 LAB — GLUCOSE, CAPILLARY
Glucose-Capillary: 141 mg/dL — ABNORMAL HIGH (ref 70–99)
Glucose-Capillary: 157 mg/dL — ABNORMAL HIGH (ref 70–99)
Glucose-Capillary: 159 mg/dL — ABNORMAL HIGH (ref 70–99)
Glucose-Capillary: 181 mg/dL — ABNORMAL HIGH (ref 70–99)
Glucose-Capillary: 219 mg/dL — ABNORMAL HIGH (ref 70–99)
Glucose-Capillary: 243 mg/dL — ABNORMAL HIGH (ref 70–99)

## 2022-04-06 LAB — CBC
HCT: 31.3 % — ABNORMAL LOW (ref 36.0–46.0)
Hemoglobin: 9.4 g/dL — ABNORMAL LOW (ref 12.0–15.0)
MCH: 30.2 pg (ref 26.0–34.0)
MCHC: 30 g/dL (ref 30.0–36.0)
MCV: 100.6 fL — ABNORMAL HIGH (ref 80.0–100.0)
Platelets: 220 10*3/uL (ref 150–400)
RBC: 3.11 MIL/uL — ABNORMAL LOW (ref 3.87–5.11)
RDW: 15.5 % (ref 11.5–15.5)
WBC: 17 10*3/uL — ABNORMAL HIGH (ref 4.0–10.5)
nRBC: 0 % (ref 0.0–0.2)

## 2022-04-06 LAB — BASIC METABOLIC PANEL
Anion gap: 5 (ref 5–15)
BUN: 35 mg/dL — ABNORMAL HIGH (ref 8–23)
CO2: 24 mmol/L (ref 22–32)
Calcium: 8.6 mg/dL — ABNORMAL LOW (ref 8.9–10.3)
Chloride: 116 mmol/L — ABNORMAL HIGH (ref 98–111)
Creatinine, Ser: 0.73 mg/dL (ref 0.44–1.00)
GFR, Estimated: >60 mL/min (ref >60)
Glucose, Bld: 243 mg/dL — ABNORMAL HIGH (ref 70–99)
Potassium: 3.7 mmol/L (ref 3.5–5.1)
Sodium: 145 mmol/L (ref 135–145)

## 2022-04-06 LAB — APTT: aPTT: 29 seconds (ref 24–36)

## 2022-04-06 LAB — MAGNESIUM: Magnesium: 2.3 mg/dL (ref 1.7–2.4)

## 2022-04-06 LAB — PHOSPHORUS: Phosphorus: 2.7 mg/dL (ref 2.5–4.6)

## 2022-04-06 MED ORDER — TRAVASOL 10 % IV SOLN
INTRAVENOUS | Status: AC
  Filled 2022-04-06: qty 1264.8

## 2022-04-06 MED ORDER — CARVEDILOL 12.5 MG PO TABS
12.5000 mg | ORAL_TABLET | Freq: Two times a day (BID) | ORAL | Status: DC
  Administered 2022-04-06: 12.5 mg
  Filled 2022-04-06 (×2): qty 1

## 2022-04-06 MED ORDER — INSULIN ASPART 100 UNIT/ML IJ SOLN
5.0000 [IU] | INTRAMUSCULAR | Status: DC
  Administered 2022-04-06 – 2022-04-07 (×5): 5 [IU] via SUBCUTANEOUS

## 2022-04-06 MED ORDER — SACUBITRIL-VALSARTAN 49-51 MG PO TABS
1.0000 | ORAL_TABLET | Freq: Two times a day (BID) | ORAL | Status: DC
  Administered 2022-04-06 – 2022-04-17 (×21): 1
  Filled 2022-04-06 (×26): qty 1

## 2022-04-06 MED ORDER — TRAVASOL 10 % IV SOLN: INTRAVENOUS | Status: DC

## 2022-04-06 MED ORDER — POTASSIUM CHLORIDE 20 MEQ PO PACK
20.0000 meq | PACK | Freq: Once | ORAL | Status: AC
  Administered 2022-04-06: 20 meq
  Filled 2022-04-06: qty 1

## 2022-04-06 MED ORDER — INSULIN DETEMIR 100 UNIT/ML ~~LOC~~ SOLN
40.0000 [IU] | Freq: Two times a day (BID) | SUBCUTANEOUS | Status: DC
  Administered 2022-04-06 (×2): 40 [IU] via SUBCUTANEOUS
  Filled 2022-04-06 (×4): qty 0.4

## 2022-04-06 NOTE — Progress Notes (Signed)
NAME:  Gabrielle [name], MRN:  [id number], DOB:  05-Mar-1956, LOS: 37 ADMISSION DATE:  03/21/2022, CONSULTATION DATE:  03/22/22 REFERRING MD:  Unk Lightning, CHIEF COMPLAINT:  abd pain   History of Present Illness:  66 year old woman with hx of rectal cancer, DM2, HTN, HLD who presented for open aortobifemoral bypass by Dr. Unk Lightning for critical limb ischemia.  Postop course complicated by shock in the setting of stress cardiomyopathy.  Patient underwent procedure with 14-7 bifurcated dacron graft, bilateral femoral endarterectomy late 5/8pm.  EBL 1.5L.  Currently c/o SOB and abdominal discomfort. Developed respiratory distress 5/10, intubated.  ICU course complicated by ileus requiring TPN with malnutrition.  She was successfully extubated 5/20.    Pertinent  Medical History  DM2- A1c 9.3% HTN HLD Severe PVD  Significant Hospital Events: Including procedures, antibiotic start and stop dates in addition to other pertinent events   5/8 Admit, Aortobifemoral bypass using 14 x 7 bifurcated Dacron graft, Bilateral femoral endarterectomy, Left-sided profunda endarterectomy with bovine pericardial patch 5/9 PCCM consult for hypotension 5/10 worsening respiratory distress, intubated 5/11 diuresis 5/16 abdominal distention, increase bowel reg, attempts to wean from ventilator limited by low volumes 5/17 tachycardic, c/f STEMI, per cardiology no STEMI, PICC line placed for TPN 5/18 return of bowel function 5/20 extubated 5/22 Waxing/waning alertness, small bore feeding tube. Not answering questions.    Interim History / Subjective:  Afebrile BP slightly elevated RN reports sacral wound with dark area, improved mental status this am Pt received '15mg'$  oxycodone in last 24 hours Glucose range 195-217  Objective   Blood pressure 137/62, pulse (!) 107, temperature 98.8 F (37.1 C), temperature source Oral, resp. rate (!) 31, height '4\' 9"'$  (1.448 m), weight 59.4 kg, SpO2 96 %. CVP:  [1 mmHg] 1 mmHg       Intake/Output Summary (Last 24 hours) at 04/06/2022 0827 Last data filed at 04/06/2022 0700 Gross per 24 hour  Intake 2419 ml  Output 3550 ml  Net -1131 ml    Filed Weights   04/02/22 0500 04/05/22 0600 04/06/22 0400  Weight: 57.8 kg 59.4 kg 59.4 kg    Examination: No distress Able to follow commands except LLE Does not like to talk Abdomen slightly more distended this am but +BS Lungs diminished bases, no accessory muscle use Sacral wound as documented yesterday  Sugars still high, insulin adjusted Cr stable CBC stable  Resolved Hospital Problem list   Postoperative shock Postoperative ileus Acute hypoxemic respiratory failure AKI Acute metabolic encephalopathy/icu delirium Assessment & Plan:   Stress Cardiomyopathy with Cardiogenic Shock - resolved Acute Systolic Heart Failure  -continue tele monitoring  -appreciate Cardiology assistance with patient care > rec's for no LHC at this time given marked improvement in EF  -continue ASA, statin, coreg, spirinolactone, increasing coreg -defer decision regarding outpatient ischemic eval to Cardiology   Hypernatremia - improved/stable Patient with elevated sodium in the setting of recent diuresis and fluid losses.  -free water PT 100 ml Q6  DM2 with Hyperglycemia -increase levemir to 40 units BID  -SSI, resistant scale + TF coverage (5 units q4h)  PAD s/p Aortobifemoral bypass  HTN, HLD hx  -post operative care per VVS  -ASA, statin -PT efforts   Anemia of critical illness -trend CBC   ICU Delirium-suspect delirium may have been "treated" with oxycodone, reduce oxy to ultram for pain control  -promote sleep/wake cycle -PT/OT efforts  - seems improved today  Ileus- improved on imaging yesterday but worsened distension today, having loose  stools -continue trickle TF, recheck KUG, serial abd exams -flexiseal -continue bowel regimen   Sacral Tissue Injury  -WOC consult  -continue medihoney -pressure  relieving measures, may need hydrotherapy  LLE paresis- has been there since surgery, will need to eval at some point down line  Best Practice (right click and "Reselect all SmartList Selections" daily)  Diet/type: NPO on TPN.  Trickle feeds DVT prophylaxis: heparin ppx GI prophylaxis: N/A Lines: PICC- TPN Foley:  N/A Code Status:  full code Last date of multidisciplinary goals of care discussion: per primary.    Erskine Emery MD PCCM

## 2022-04-06 NOTE — Progress Notes (Addendum)
  Progress Note    04/06/2022 7:54 AM 16 Days Post-Op  Subjective:  Loose stools overnight. Resting comfortably this morning   Vitals:   04/06/22 0635 04/06/22 0700  BP: (!) 150/68 137/62  Pulse: (!) 107 (!) 107  Resp: (!) 34 (!) 31  Temp:    SpO2: 95% 96%   Physical Exam: Lungs:  extubated; non labored on RA Incisions:  abd and groin incisions c/d/i Extremities:  Feet symmetrically warm and well perfused; stable motor and sensation deficit L LE Abdomen:  distended with pain to palpation Neurologic: stable  CBC    Component Value Date/Time   WBC 17.0 (H) 04/06/2022 0428   RBC 3.11 (L) 04/06/2022 0428   HGB 9.4 (L) 04/06/2022 0428   HGB 14.5 03/22/2021 0916   HCT 31.3 (L) 04/06/2022 0428   HCT 43.9 03/22/2021 0916   PLT 220 04/06/2022 0428   PLT 261 03/22/2021 0916   MCV 100.6 (H) 04/06/2022 0428   MCV 90 03/22/2021 0916   MCH 30.2 04/06/2022 0428   MCHC 30.0 04/06/2022 0428   RDW 15.5 04/06/2022 0428   RDW 12.5 03/22/2021 0916   LYMPHSABS 2.8 02/03/2015 0527   MONOABS 0.4 02/03/2015 0527   EOSABS 0.1 02/03/2015 0527   BASOSABS 0.0 02/03/2015 0527    BMET    Component Value Date/Time   NA 145 04/06/2022 0530   NA 137 03/22/2021 0916   K 3.7 04/06/2022 0530   CL 116 (H) 04/06/2022 0530   CO2 24 04/06/2022 0530   GLUCOSE 243 (H) 04/06/2022 0530   BUN 35 (H) 04/06/2022 0530   BUN 11 03/22/2021 0916   CREATININE 0.73 04/06/2022 0530   CREATININE 0.74 11/11/2015 1921   CALCIUM 8.6 (L) 04/06/2022 0530   GFRNONAA >60 04/06/2022 0530   GFRNONAA >89 11/11/2015 1921   GFRAA 78 10/23/2020 1701   GFRAA >89 11/11/2015 1921    INR    Component Value Date/Time   INR 1.1 03/27/2022 0510     Intake/Output Summary (Last 24 hours) at 04/06/2022 0754 Last data filed at 04/06/2022 0700 Gross per 24 hour  Intake 2499.03 ml  Output 3550 ml  Net -1050.97 ml     Assessment/Plan:  66 y.o. female is s/p ABF bypass complicated by post op MI and heart failure 16  Days Post-Op   General: appears comfortable; following commands, not speaking Lungs: non labored on RA Abd: loose stools, more distended this morning, KUB improved but still some dilated small bowel; continue TPN; may need to pause trickle feeds if abd exam worsens BLE warm and well perfused; L leg numbness and weakness stable Appreciate help from critical care team   Dagoberto Ligas, PA-C [facility] [phone] 04/06/2022 7:54 AM  VASCULAR STAFF ADDENDUM: I have independently interviewed and examined the patient. I agree with the above.  Can increase TF Had a long discussion with both her and her husband regarding therpay and the importance of aggressive rehab Would like for her to be more vocal. Left leg with some weakness, but able to move it. Will continue to watch. This is not new. Watching leukocytosis PT/OT OOB as much as possible  Hydrotherapy to sacral wound  Appreciate nursing and critical care assistance with her care.   Cassandria Santee, MD [facility] Phone Number: [phone] 04/06/2022 12:45 PM

## 2022-04-06 NOTE — Progress Notes (Addendum)
PHARMACY - TOTAL PARENTERAL NUTRITION CONSULT NOTE   Indication: Prolonged ileus  Patient Measurements: Height: '4\' 9"'$  (144.8 cm) Weight: 59.4 kg (130 lb 15.3 oz) IBW/kg (Calculated) : 38.6 TPN AdjBW (KG): 40.6 Body mass index is 28.34 kg/m. Usual Weight: ~118 lbs per chart review   Assessment:  66 yo W admitted with BL LE critical limb ischemia s/p aortofemoral bypass, BL femoral endarterectomy and Left-sided profunda endarterectomy with bovine pericardial patch 5/8. Patient with cardiogenic shock post-procedure now fluid overloaded. Patient with return of bowel function and multiple large stools 5/17, however, continues to have emesis and high NGT output. Imagining consistent with prolonged ileus. Patient received some trickle TF 5/11-5/13 and 5/16. While on TF (received up to 76g dextrose/24hr), patient required 41- 60 units of insulin. Patient is at risk for refeeding. Pharmacy consulted for TPN.   Of note, triglycerides are elevated at baseline (have been as high as 499 this admission while on propofol) but has been stable since TPN start. Per team, pt appears dry with Na elevated and CVP low. Ok to liberalize TPN fluids.   Glucose / Insulin: A1C 9.2. CBGs 200-240s, used 32 units SSI with levemir 35 BID + 109 units insulin in TPN (Max allowed in TPN)  Electrolytes: Na 145 (On FW 100 q6h), K 3.7, Cl 116, CoCa 9.8, phos 2.7 (s/p K phos neutral '250mg'$  x1) Renal: Scr 0.73, BUN 35 Hepatic: LFT/Tbili wnl, albumin 2.5, TG 215 (stable) Intake / Output; MIVF: UOP 1.1 ml/kg/hr, NGT removed 5/22, Cortrak in place, LBM 5/23 - 2081m stool GI Imaging: 5/8 KUB: unremarkable 5/13 KUB: increased small bowel gas, ileus vs obstruction 5/14 KUB: ileus vs SBO  5/15 KUB: Similar gaseous distension of small bowel likely resolving ileus  5/17 KUB: Similar small bowel and proximal colonic distension consistent with postoperative ileus  5/23 KUB: persistent mild distended small bowl loops; dec  distention 5/24 KUB: less intestinal gas, no dilated small bowel loops GI Surgeries / Procedures:  5/17 NGT placement   Central access: PICC 5/17  TPN start date: 5/18  Nutritional Goals: Goal TPN rate (non-concentrated) is 70 mL/hr, 57 g/L of 10% protein, 15% dextrose, 30 g/L lipids (provides 97 g of protein, 252 g dextrose, 54 g lipids and 1778 kcals per day)  RD Assessment: Estimated Needs Total Energy Estimated Needs: 1750-1950 kcals Total Protein Estimated Needs: 95-110 g Total Fluid Estimated Needs: >/= 1.7 L  Current Nutrition:  TPN and Vital AF 1.2 at 10 ml/hr  Plan:  Continue TPN at goal rate of 70 mL/hr at 1800 (provides 100.8 g protein, 1751 kcals, meeting 100% of estimated needs)  Electrolytes in TPN: Increase phos to 15 mmol/L. Continue Na 0 mEq/L, K 50 mEq/L, Mg 8 mEq/L, Ca 0 mEq/L; max acetate Add standard MVI and trace elements to TPN Potassium 23m x1 - holding on increasing K in TPN for now with new entresto and spiro Continue insulin in TPN at 109 units (*at max of 65 units/L) and increased levemir to 40 twice daily and start of 5 units q4h per CCM Continue resistant q4h SSI  TPN las Mon/Thurs F/u triglyceride trend with increased lipids in TPN Follow-up tube feed toleration and titration and ability to wean TPN  F/u abdominal distension and recheck KUNorth ZanesvillePharmD, BCPS Clinical Pharmacist 04/06/2022 7:03 AM   Addendum: Contacted by RD with updated protein and kcal goals. Goal protein 110-130 g - asked by RD to target upper range. Goal kcal 1900-2100 kcal. Adjusted TPN to reflect  updated goals but limited dextrose to contain ~ same total g dextrose as current TPN due to hyperglycemia. New TPN at 85 ml/hr will provide 126g AA and 1902 kcal/day.   Plan: - start new TPN at 44m/hr to provide 126 g and 1902 kcal - adjust percent dextrose from current of 11.6 % to goal of 13% with improved hyperglycemia to provide 2000kcal/day - Adjust insulin to 132  units/TPN (max of 65 units/L) with new goal rate; adding hold parameters to scheduled 5 units q4h to hold for CBG <120 - Adjusted electrolytes to reflect same current total electrolytes in TPN with increase in phos -  Na 0 mEq/L. K 45 mEq/L, Ca 0 mEq/L, Mg 6 mEq/L, phos 12 mmol/L (inc from current TPN), max acetate  GCristela Felt PharmD, BCPS Clinical Pharmacist 04/06/2022 11:06 AM

## 2022-04-06 NOTE — TOC Progression Note (Signed)
Transition of Care [facility]) - Progression Note    Patient Details  Name: Gabrielle [name] MRN: [id number] Date of Birth: 1956/09/14  Transition of Care [facility]) CM/SW Contact  Graves-Bigelow, Ocie Cornfield, RN Phone Number: 04/06/2022, 11:13 AM  Clinical Narrative: Case Manager received consult for LTAC. Spouse at the bedside and we discussed options of [facility] vs [facility]. Spouse would rather have patient go to [facility] since in the hospital; however he has questions for the provider. Case Manager did make the spouse aware that she will reach out to the [facility] Liaison Lorenza Burton to see if she can come down and speak with family. Case Manager will continue to follow for additional transition of care needs.   Expected Discharge Plan: Long Term Acute Care (LTAC) Barriers to Discharge: Continued Medical Work up  Expected Discharge Plan and Services Expected Discharge Plan: [place] (LTAC)   Discharge Planning Services: CM Consult Post Acute Care Choice: Long Term Acute Care (LTAC) Living arrangements for the past 2 months: Single Family Home                  Readmission Risk Interventions     View : No data to display.

## 2022-04-06 NOTE — Progress Notes (Signed)
Occupational Therapy Treatment Note - late entry  Entered room with husband asking pt if staff had been walking her to the bathroom. Do not feel husband understand his wife's current functional deficits. Family left room for session, however discussed need for post acute rehab with them after the session. Able to progress OOB to chair with +2 Max A; requires total A with all ADL tasks at this time. Pt restless during session, frequently rubbing nose/coretrack with gloved mitt. Removed mitt and attempted to have pt wash her face however pt resisted all movement. Pt non-verbbal throughout session however at the very end of session pt waved goodbye.  Recommend all strategies to reduce risk of delirium. Continue to recommend rehab at [facility]. If pt progresses, she may become appropriate for rehab at [facility].    04/05/22 1550  OT Visit Information  Last OT Received On 04/06/22  Assistance Needed +2  PT/OT/SLP Co-Evaluation/Treatment Yes  Reason for Co-Treatment Complexity of the patient's impairments (multi-system involvement);For patient/therapist safety;To address functional/ADL transfers;Necessary to address cognition/behavior during functional activity  OT goals addressed during session ADL's and self-care;Strengthening/ROM  History of Present Illness Patient is a 66 y/o female who presents on 5/8 for open aortobifemoral bypass and Bilateral femoral endarterectomy complicated by MI, heart failure and shock, intubated 5/10-5/20. PMH includes rectal cancer, DM2, HTN.  Precautions  Precautions Fall;Other (comment)  Precaution Comments NG tube, bil mitts  Pain Assessment  Pain Assessment Faces  Faces Pain Scale 6  Pain Location generalized with movement, likely bottom due to sore  Pain Descriptors / Indicators Grimacing;Guarding;Discomfort  Pain Intervention(s) Limited activity within patient's tolerance  Cognition  Arousal/Alertness Awake/alert  Behavior During Therapy Flat affect  Overall Cognitive  Status Impaired/Different from baseline  Area of Impairment Attention;Following commands;Safety/judgement;Awareness;Problem solving  Current Attention Level Focused  Following Commands Follows one step commands inconsistently  Safety/Judgement Decreased awareness of safety;Decreased awareness of deficits  Awareness Intellectual  Problem Solving Slow processing;Decreased initiation;Difficulty sequencing;Requires verbal cues;Requires tactile cues  General Comments Pt not verbal throughout session, nodding yes or no occasionally to questions asked. Follows a few simple commands but inconsistently. Poor attention. Nods yes to remembering this therapist.  Upper Extremity Assessment  Upper Extremity Assessment Generalized weakness  Lower Extremity Assessment  Lower Extremity Assessment Defer to PT evaluation  Vision- Assessment  Additional Comments poor visual attention; conjugate gaze  ADL  Eating/Feeding NPO  Grooming Total assistance  Bed Mobility  Overal bed mobility Needs Assistance  Bed Mobility Rolling;Sidelying to Sit  Rolling Total assist;+2 for physical assistance  Sidelying to sit Total assist;+2 for physical assistance;HOB elevated  General bed mobility comments Rolling to left/right with total A for pericare, very little initiation of movement to assist. Total A to bring LEs to EOB and elevate trunk as well as scoot hips forward. Legs not able to touch floor due to short stature.  Transfers  Overall transfer level Needs assistance  Equipment used 2 person hand held assist  Transfers Sit to/from Stand;Bed to chair/wheelchair/BSC  Sit to Stand Total assist  Bed to/from chair/wheelchair/BSC transfer type: Squat pivot  Squat pivot transfers Total assist;+2 physical assistance  General transfer comment Total A of 2 to stand and squat pivot to chair towards left side, little to no assist from patient. Requires mutliple attempts to stand to clear bottom to get onto chair, not ideal  height due to height of pt.  Balance  Overall balance assessment Needs assistance  Sitting-balance support Feet unsupported  Sitting balance-Leahy Scale Zero  Sitting  balance - Comments posterior and R bias  General Comments  General comments (skin integrity, edema, etc.) sacral wound  OT - End of Session  Equipment Utilized During Treatment Oxygen (2L; SpO2 above 90 on RA throughout session)  Activity Tolerance Patient tolerated treatment well  Patient left in chair;with call bell/phone within reach;with chair alarm set  Nurse Communication Mobility status;Need for lift equipment  OT Assessment/Plan  OT Plan Discharge plan remains appropriate  OT Visit Diagnosis Unsteadiness on feet (R26.81);Other abnormalities of gait and mobility (R26.89);Muscle weakness (generalized) (M62.81);Other symptoms and signs involving cognitive function  OT Frequency (ACUTE ONLY) Min 2X/week  Follow Up Recommendations Skilled nursing-short term rehab (<3 hours/day)  Assistance recommended at discharge Frequent or constant Supervision/Assistance  Patient can return home with the following Two people to help with walking and/or transfers;Two people to help with bathing/dressing/bathroom;Direct supervision/assist for medications management;Direct supervision/assist for financial management;Assist for transportation;Help with stairs or ramp for entrance  OT Equipment BSC/3in1;Other (comment)  AM-PAC OT "6 Clicks" Daily Activity Outcome Measure (Version 2)  Help from another person eating meals? 1  Help from another person taking care of personal grooming? 1  Help from another person toileting, which includes using toliet, bedpan, or urinal? 1  Help from another person bathing (including washing, rinsing, drying)? 1  Help from another person to put on and taking off regular upper body clothing? 1  Help from another person to put on and taking off regular lower body clothing? 1  6 Click Score 6  Progressive  Mobility  What is the highest level of mobility based on the progressive mobility assessment? Level 1 (Bedfast) - Unable to balance while sitting on edge of bed  Activity Transferred from bed to chair  OT Goal Progression  Progress towards OT goals Not progressing toward goals - comment (cognitive impairment; total A for ADL at this time)  Acute Rehab OT Goals  Patient Stated Goal none stated  OT Goal Formulation With patient/family  Time For Goal Achievement 04/16/22  Potential to Achieve Goals Fair  ADL Goals  Pt Will Perform Grooming with set-up;sitting  Pt Will Perform Upper Body Dressing sitting;with mod assist  Pt Will Perform Lower Body Dressing with max assist;sit to/from stand  Pt Will Transfer to Toilet with max assist;stand pivot transfer;bedside commode  Pt Will Perform Toileting - Clothing Manipulation and hygiene with max assist;sit to/from stand  Pt/caregiver will Perform Home Exercise Program Both right and left upper extremity;With theraband;With written HEP provided;With minimal assist  Additional ADL Goal #1 Pt will perform bed mobility to come EOB as precursor to participation in ADL at mod A  OT Time Calculation  OT Start Time (ACUTE ONLY) 1334  OT Stop Time (ACUTE ONLY) 1421  OT Time Calculation (min) 47 min  OT General Charges  $OT Visit 1 Visit  OT Treatments  $Self Care/Home Management  8-22 mins  $Therapeutic Activity 8-22 mins   Maurie Boettcher, OT/L   Acute OT Clinical Specialist [place] Pager [phone] Office [phone]

## 2022-04-06 NOTE — Progress Notes (Signed)
Progress Note  Patient Name: Gabrielle [name] Date of Encounter: 04/06/2022  [facility] Cardiologist: Janina Mayo, MD   Subjective   Family member in room.  Talking on phone   Inpatient Medications    Scheduled Meds:  aspirin  81 mg Per Tube Daily   bacitracin   Topical TID   carvedilol  12.5 mg Per Tube BID WC   Chlorhexidine Gluconate Cloth  6 each Topical Daily   docusate  100 mg Per Tube BID   free water  100 mL Per Tube Q6H   heparin injection (subcutaneous)  5,000 Units Subcutaneous Q8H   insulin aspart  0-20 Units Subcutaneous Q4H   insulin aspart  5 Units Subcutaneous Q4H   insulin detemir  40 Units Subcutaneous BID   leptospermum manuka honey  1 application. Topical Daily   mouth rinse  15 mL Mouth Rinse BID   pantoprazole (PROTONIX) IV  40 mg Intravenous QHS   polyethylene glycol  17 g Per Tube Daily   QUEtiapine  25 mg Per Tube QHS   rosuvastatin  40 mg Per Tube Daily   sacubitril-valsartan  1 tablet Per Tube BID   sodium chloride flush  10-40 mL Intracatheter Q12H   spironolactone  25 mg Per Tube Daily   Continuous Infusions:  sodium chloride 10 mL/hr at 04/06/22 0700   feeding supplement (VITAL AF 1.2 CAL) 1,000 mL (04/05/22 1012)   magnesium sulfate bolus IVPB     TPN ADULT (ION) 70 mL/hr at 04/06/22 0700   TPN ADULT (ION)     PRN Meds: sodium chloride, acetaminophen (TYLENOL) oral liquid 160 mg/5 mL, bisacodyl, guaiFENesin-dextromethorphan, hydrALAZINE, labetalol, lip balm, magnesium sulfate bolus IVPB, ondansetron, oxyCODONE, phenol, silver nitrate applicators, sodium chloride flush, traMADol   Vital Signs    Vitals:   04/06/22 0635 04/06/22 0700 04/06/22 0800 04/06/22 0823  BP: (!) 150/68 137/62 (!) 142/63   Pulse: (!) 107 (!) 107 (!) 106 (!) 111  Resp: (!) 34 (!) 31 (!) 32 (!) 27  Temp:    98.8 F (37.1 C)  TempSrc:    Oral  SpO2: 95% 96% 95% 96%  Weight:      Height:        Intake/Output Summary (Last 24 hours) at 04/06/2022  1103 Last data filed at 04/06/2022 0700 Gross per 24 hour  Intake 2148.94 ml  Output 3150 ml  Net -1001.06 ml      04/06/2022    4:00 AM 04/05/2022    6:00 AM 04/02/2022    5:00 AM  Last 3 Weights  Weight (lbs) 130 lb 15.3 oz 130 lb 15.3 oz 127 lb 6.8 oz  Weight (kg) 59.4 kg 59.4 kg 57.8 kg      Telemetry    Sinus rhythm- Personally Reviewed Physical Exam   Comfortable in bed.  Labs    High Sensitivity Troponin:   Recent Labs  Lab 03/23/22 1138 03/24/22 0843 03/25/22 1037 03/30/22 0057 03/30/22 0307  TROPONINIHS 3,592* 5,007* 2,820* 347* 316*     Chemistry Recent Labs  Lab 03/31/22 0735 04/01/22 0440 04/01/22 0751 04/03/22 0525 04/05/22 0345 04/06/22 0530  NA 148* 150*   < > 149* 145 145  K 3.8 4.1   < > 4.1 4.0 3.7  CL 117* 118*   < > 119* 116* 116*  CO2 26 28   < > '27 26 24  '$ GLUCOSE 129* 365*   < > 261* 206* 243*  BUN 38* 37*   < >  26* 31* 35*  CREATININE 1.27* 1.14*   < > 0.78 0.76 0.73  CALCIUM 8.6* 8.9   < > 9.3 9.0 8.6*  MG 2.5* 2.6*   < > 1.9 2.1 2.3  PROT 6.2* 6.3*  --   --   --   --   ALBUMIN 2.7* 2.5*  --   --   --   --   AST 33 28  --   --   --   --   ALT 22 19  --   --   --   --   ALKPHOS 126 112  --   --   --   --   BILITOT 0.9 1.0  --   --   --   --   GFRNONAA 47* 53*   < > >60 >60 >60  ANIONGAP 5 4*   < > 3* 3* 5   < > = values in this interval not displayed.    Lipids  Recent Labs  Lab 04/02/22 0217  TRIG 215*    Hematology Recent Labs  Lab 03/31/22 0425 04/02/22 0217 04/06/22 0428  WBC 14.9* 16.6* 17.0*  RBC 2.87* 3.03* 3.11*  HGB 8.3* 8.8* 9.4*  HCT 27.4* 29.1* 31.3*  MCV 95.5 96.0 100.6*  MCH 28.9 29.0 30.2  MCHC 30.3 30.2 30.0  RDW 14.3 14.4 15.5  PLT 272 354 220   Thyroid No results for input(s): TSH, FREET4 in the last 168 hours.  BNPNo results for input(s): BNP, PROBNP in the last 168 hours.  DDimer No results for input(s): DDIMER in the last 168 hours.   Radiology    DG Abd 1 View  Result Date:  04/06/2022 CLINICAL DATA:  Ileus EXAM: ABDOMEN - 1 VIEW COMPARISON:  04/05/2022 FINDINGS: Soft feeding tube remains in place within the 3rd to 4th portion of the duodenum. There is less visible intestinal gas. No worsening or new finding. IMPRESSION: Less intestinal gas than seen yesterday. No dilated small bowel loops. Soft feeding tube tip remains in the junction of the third and fourth portion of the duodenum. Electronically Signed   By: Nelson Chimes M.D.   On: 04/06/2022 08:57   DG Abd Portable 1V  Result Date: 04/05/2022 CLINICAL DATA:  Abdominal distension EXAM: PORTABLE ABDOMEN - 1 VIEW COMPARISON:  Radiograph 03/30/2022 FINDINGS: Feeding tube tip overlies the descending duodenum. Removal of nasogastric tube. There are few prominent loops of small bowel in the left hemiabdomen, overall decreased in distension in comparison prior. There is also decreased distention of the colon. IMPRESSION: Decreased distention of small bowel and colon, with some persistent mildly distended small bowel loops in the left hemiabdomen. Feeding tube tip overlies the descending duodenum. Electronically Signed   By: Maurine Simmering M.D.   On: 04/05/2022 08:51    Cardiac Studies   EF now 55 to 60%.  Was 20%  Patient Profile     66 y.o. female with PAD presenting with critical limb ischemia, poorly controlled diabetes mellitus, hyperlipidemia, hypertension developing acute systolic heart failure and cardiogenic shock roughly 48 hours following aortobifemoral bypass surgery and bilateral femoral enterectomy, left-sided profunda endarterectomy, performed 03/21/2022.  Echocardiogram showed acute reduction in LVEF from 55 to 60% to 20-25% from 1 day to the next 05/09 - 05/10.  Limited echo on 04/03/2022 shows normal ejection fraction 55 to 60%  Assessment & Plan    Takotsubo cardiomyopathy Type II myocardial infarction - Supportive care.  Started Entresto 49/51 medium dose yesterday.  Carvedilol also  added/increased to 12.5  twice a day.  Tolerating well from a blood pressure perspective.  Continue. -Also on spironolactone 25 mg. -Since there is improvement in EF, no need for coronary angiogram.  PAD status post aortobifemoral bypass - Vascular surgery note reviewed.  Sacral decubitus - Observed wound yesterday with wound care team.    For questions or updates, please contact [place] Please consult [email or web address] for contact info under        Signed, Candee Furbish, MD  04/06/2022, 11:03 AM

## 2022-04-06 NOTE — Progress Notes (Addendum)
Physical Therapy Wound Treatment Patient Details  Name: Gabrielle [name] MRN: [id number] Date of Birth: [birth year]  Today's Date: 04/06/2022 Time: 7619-5093 Time Calculation (min): 44 min  Subjective  Subjective Assessment Subjective: Pt did state "Alright" when asked how she was doing upon entry, but otherwise was non-verbal with pt nodding/shaking head to answer question/agree/disagree throughout remaining session. Patient and Family Stated Goals: not stated Date of Onset:  (unknown) Prior Treatments: dressing changes  Pain Score:  Pt premedicated, fidgeting often during session, but when asked if she had pain she would shake her head "no"  Wound Assessment  Pressure Injury 03/30/22 Sacrum Posterior;Right;Left Deep Tissue Pressure Injury - Purple or maroon localized area of discolored intact skin or blood-filled blister due to damage of underlying soft tissue from pressure and/or shear. large deep tissue inj (Active)  Wound Image  04/05/22 1232  Dressing Type Foam - Lift dressing to assess site every shift;Barrier Film (skin prep);Gauze (Comment);Other (Comment) 04/06/22 1236  Dressing Clean, Dry, Intact 04/06/22 1236  Dressing Change Frequency Daily 04/06/22 1236  State of Healing Eschar 04/06/22 1236  Site / Wound Assessment Brown;Yellow;Pink;Pale 04/06/22 1236  % Wound base Red or Granulating 5% 04/06/22 1236  % Wound base Yellow/Fibrinous Exudate 5% 04/06/22 1236  % Wound base Black/Eschar 90% 04/06/22 1236  % Wound base Other/Granulation Tissue (Comment) 0% 04/06/22 1236  Peri-wound Assessment Erythema (non-blanchable);Pink 04/06/22 1236  Wound Length (cm) 7 cm 04/05/22 1232  Wound Width (cm) 12 cm 04/05/22 1232  Wound Depth (cm) 0 cm 04/05/22 1232  Wound Surface Area (cm^2) 84 cm^2 04/05/22 1232  Wound Volume (cm^3) 0 cm^3 04/05/22 1232  Margins Unattached edges (unapproximated) 04/06/22 1236  Drainage Amount Scant 04/06/22 1236  Drainage Description Serosanguineous  04/06/22 1236  Treatment Cleansed;Debridement (Selective);Hydrotherapy (Pulse lavage);Packing (Dry gauze);Other (Comment) 04/06/22 1236      Hydrotherapy Pulsed lavage therapy - wound location: sacral Pulsed Lavage with Suction (psi): 12 psi Pulsed Lavage with Suction - Normal Saline Used: 1000 mL Pulsed Lavage Tip: Tip with splash shield Selective Debridement Selective Debridement - Location: sacral Selective Debridement - Tools Used: Forceps, Scalpel Selective Debridement - Tissue Removed: Black and a small amount of yellow, the wound bed did bleed continuously 1x even after pressure was applied so did have to use silver nitrate 1x to stop the bleeding, but otherwise was able to remove without much difficulty    Wound Assessment and Plan  Wound Therapy - Assess/Plan/Recommendations Wound Therapy - Clinical Statement: The pt's wound bed is making gradual progress with hydrotherapy. She continues to have black eschar over the majority of the wound and would benefit from further pulsed lavage and selective debirdement to facilitate increased granulation tissue and decreased necrotic tissue to thereby improve the healing process. Wound Therapy - Functional Problem List: functional debility after prolonged time in bed. Factors Delaying/Impairing Wound Healing: Immobility, Multiple medical problems, Vascular compromise, Diabetes Mellitus Hydrotherapy Plan: Dressing change, Debridement, Pulsatile lavage with suction, Patient/family education Wound Therapy - Frequency: 3X / week Wound Therapy - Current Recommendations: PT Wound Therapy - Follow Up Recommendations: dressing changes by RN, dressing changes by family/patient  Wound Therapy Goals- Improve the function of patient's integumentary system by progressing the wound(s) through the phases of wound healing (inflammation - proliferation - remodeling) by: Wound Therapy Goals - Improve the function of patient's integumentary system by  progressing the wound(s) through the phases of wound healing by: Decrease Necrotic Tissue to: 10 percent Decrease Necrotic Tissue - Progress: Progressing toward goal Increase Granulation  Tissue to: 90% Increase Granulation Tissue - Progress: Progressing toward goal Improve Drainage Characteristics: Min, Serous Improve Drainage Characteristics - Progress: Progressing toward goal Goals/treatment plan/discharge plan were made with and agreed upon by patient/family: Yes Time For Goal Achievement: 7 days Wound Therapy - Potential for Goals: Good  Goals will be updated until maximal potential achieved or discharge criteria met.  Discharge criteria: when goals achieved, discharge from hospital, MD decision/surgical intervention, no progress towards goals, refusal/missing three consecutive treatments without notification or medical reason.  GP     Charges PT Wound Care Charges $Wound Debridement up to 20 cm: < or equal to 20 cm $ Wound Debridement each add'l 20 sqcm: 3 $PT PLS Gun and Tip: 1 Supply $PT Hydrotherapy Visit: 1 Visit      Moishe Spice, PT, DPT Acute Rehabilitation Services  Pager: [phone] Office: [place] 04/06/2022, 3:11 PM

## 2022-04-06 NOTE — Progress Notes (Signed)
Nutrition Follow-up  DOCUMENTATION CODES:   Severe malnutrition in context of acute illness/injury  INTERVENTION:   Recommend continuing TPN until pt TF initiated and pt demonstrating tolerance and TF being titrated to goal successfully   Continue TPN to meet 100% estimated nutrition needs -RD increased nutritional needs today given significant wound requiring hydrotherapy, malnutrition. Discussed with TPN Pharmacists   Tube Feeding via Cortrak (post pyloric): Given significant stool output with just 10 ml/hr of TF, do not recommend increasing TF at this time. If ileus has resolved, consider pulling Cortrak back to gastric Vital AF 1.2 at 10 ml/hr (no titration) Goal rate of 70 ml/hr Goal regimen provides 1872 kcals, 117 g of protein and 1264 mL of free water   Once demonstrating tolerance of TF, plan to add  Juven BID, each packet provides 80 calories, 8 grams of carbohydrate, 2.5  grams of protein (collagen), 7 grams of L-arginine and 7 grams of L-glutamine; supplement contains CaHMB, Vitamins C, E, B12 and Zinc to promote wound healing  NUTRITION DIAGNOSIS:   Severe Malnutrition related to acute illness as evidenced by mild fat depletion, moderate muscle depletion, energy intake < or equal to 50% for > or equal to 5 days.  Being addressed via TPN  GOAL:   Patient will meet greater than or equal to 90% of their needs  Met via nutrition support  MONITOR:   Vent status, Weight trends, Labs, TF tolerance  REASON FOR ASSESSMENT:   Consult, Ventilator Enteral/tube feeding initiation and management  ASSESSMENT:   66 yo female admitted for aortobifemoral bypass due to right-sided Rutherford 4 critical limb ischemia, left-sided Rutherford 3 critical limb ischemia.  Pt developed postop shock and respiratory failure requiring intubation.  PMH includes DM, HTN, HLD, severe PAD  5/08 Aortobifemoral bypass using 14 x 7 bifurcated Dacron graft, Bilateral femoral endarterectomy,   Left-sided profunda endarterectomy with bovine pericardial patch 5/09 PCCM consult for hypotension 5/10 Worsening respiratory status requiring intubation 5/11 Trickle TF initiated 5/12 Initiated TF titration 5/13 TF decreased back to trickle TF given abd distention: abd xray with dilated SB 5/17 TF continued at trickle TF rate until pt experienced large volume emesis with additional NG suction 1.5 L out, TPN recommended, Cortrak placed-gastric, unable to get PP 5/18 TPN initiated 5/20 Extubated  5/23 Abd xray with decreasing distention in SB and colon, Cortrak with tip in duodenum  Continues on TPN at 70 ml/hr providing 1778 kcals, 97 g of protein  Trickle TF of Vital AF 1.2 started at 10 ml/hr today. Noted multiple loose stools over night, flexiseal inserted. Pt with increased abd distention and abd xray obtained  Rectal tube with 2 L of stool post insertion overnight. Noted 5 stool occurrences in  addition to this that are not charted.   Hydrotherapy again today; sacral wound 7 cm x 12 cm x 0 cm on initiation  CBGs a little better today  Labs: sodium 145 (wdl), CBGs 187-243 (goal 140-180) Meds: colace, ss novolog levemir   Diet Order:   Diet Order             Diet NPO time specified  Diet effective now                   EDUCATION NEEDS:   Not appropriate for education at this time  Skin:  Skin Assessment: Skin Integrity Issues: Skin Integrity Issues:: Unstageable DTI: perineum Unstageable: sacrococcygeal: seen by WOC  Last BM:  5/23  Height:   Ht Readings from  Last 1 Encounters:  03/24/22 4' 9"  (1.448 m)    Weight:   Wt Readings from Last 1 Encounters:  04/06/22 59.4 kg    BMI:  Body mass index is 28.34 kg/m.  Estimated Nutritional Needs:   Kcal:  1900-2100 kcals  Protein:  110-130 g  Fluid:  >/= 1.9 L  Kerman Passey MS, RDN, LDN, CNSC Registered Dietitian III Clinical Nutrition RD Pager and On-Call Pager Number Located in [place]

## 2022-04-06 NOTE — TOC Benefit Eligibility Note (Signed)
Patient Advocate Encounter  Prior Authorization for Entresto 49-'51MG'$  tablets has been approved.    PA# [id number] Effective dates: 04/05/2022 through 04/06/2023  Patients co-pay is $30.00.     Lyndel Safe, [place] Patient Advocate Specialist [place] Patient Advocate Team Direct Number: [phone]  Fax: [phone]

## 2022-04-07 ENCOUNTER — Inpatient Hospital Stay (HOSPITAL_COMMUNITY): Payer: BC Managed Care – PPO

## 2022-04-07 DIAGNOSIS — I739 Peripheral vascular disease, unspecified: Secondary | ICD-10-CM | POA: Diagnosis not present

## 2022-04-07 LAB — CBC
HCT: 30.6 % — ABNORMAL LOW (ref 36.0–46.0)
Hemoglobin: 9.4 g/dL — ABNORMAL LOW (ref 12.0–15.0)
MCH: 28.7 pg (ref 26.0–34.0)
MCHC: 30.7 g/dL (ref 30.0–36.0)
MCV: 93.6 fL (ref 80.0–100.0)
Platelets: 435 10*3/uL — ABNORMAL HIGH (ref 150–400)
RBC: 3.27 MIL/uL — ABNORMAL LOW (ref 3.87–5.11)
RDW: 14.9 % (ref 11.5–15.5)
WBC: 17.9 10*3/uL — ABNORMAL HIGH (ref 4.0–10.5)
nRBC: 0 % (ref 0.0–0.2)

## 2022-04-07 LAB — GLUCOSE, CAPILLARY
Glucose-Capillary: 112 mg/dL — ABNORMAL HIGH (ref 70–99)
Glucose-Capillary: 135 mg/dL — ABNORMAL HIGH (ref 70–99)
Glucose-Capillary: 138 mg/dL — ABNORMAL HIGH (ref 70–99)
Glucose-Capillary: 144 mg/dL — ABNORMAL HIGH (ref 70–99)
Glucose-Capillary: 165 mg/dL — ABNORMAL HIGH (ref 70–99)
Glucose-Capillary: 168 mg/dL — ABNORMAL HIGH (ref 70–99)

## 2022-04-07 LAB — COMPREHENSIVE METABOLIC PANEL
ALT: 42 U/L (ref 0–44)
AST: 41 U/L (ref 15–41)
Albumin: 2.4 g/dL — ABNORMAL LOW (ref 3.5–5.0)
Alkaline Phosphatase: 108 U/L (ref 38–126)
Anion gap: 4 — ABNORMAL LOW (ref 5–15)
BUN: 38 mg/dL — ABNORMAL HIGH (ref 8–23)
CO2: 22 mmol/L (ref 22–32)
Calcium: 8.3 mg/dL — ABNORMAL LOW (ref 8.9–10.3)
Chloride: 119 mmol/L — ABNORMAL HIGH (ref 98–111)
Creatinine, Ser: 0.73 mg/dL (ref 0.44–1.00)
GFR, Estimated: >60 mL/min (ref >60)
Glucose, Bld: 128 mg/dL — ABNORMAL HIGH (ref 70–99)
Potassium: 4 mmol/L (ref 3.5–5.1)
Sodium: 145 mmol/L (ref 135–145)
Total Bilirubin: 0.6 mg/dL (ref 0.3–1.2)
Total Protein: 6.7 g/dL (ref 6.5–8.1)

## 2022-04-07 LAB — APTT: aPTT: 31 seconds (ref 24–36)

## 2022-04-07 LAB — PHOSPHORUS: Phosphorus: 2.9 mg/dL (ref 2.5–4.6)

## 2022-04-07 LAB — TRIGLYCERIDES: Triglycerides: 157 mg/dL — ABNORMAL HIGH (ref <150)

## 2022-04-07 LAB — MAGNESIUM: Magnesium: 2.2 mg/dL (ref 1.7–2.4)

## 2022-04-07 MED ORDER — CARVEDILOL 25 MG PO TABS
25.0000 mg | ORAL_TABLET | Freq: Two times a day (BID) | ORAL | Status: DC
  Administered 2022-04-07 – 2022-04-13 (×12): 25 mg
  Filled 2022-04-07 (×12): qty 1

## 2022-04-07 MED ORDER — INSULIN ASPART 100 UNIT/ML IJ SOLN
3.0000 [IU] | INTRAMUSCULAR | Status: DC
  Administered 2022-04-07 – 2022-04-09 (×13): 3 [IU] via SUBCUTANEOUS

## 2022-04-07 MED ORDER — INSULIN DETEMIR 100 UNIT/ML ~~LOC~~ SOLN
30.0000 [IU] | Freq: Two times a day (BID) | SUBCUTANEOUS | Status: DC
  Administered 2022-04-07 – 2022-04-10 (×7): 30 [IU] via SUBCUTANEOUS
  Filled 2022-04-07 (×8): qty 0.3

## 2022-04-07 MED ORDER — TRAMADOL HCL 50 MG PO TABS
50.0000 mg | ORAL_TABLET | Freq: Four times a day (QID) | ORAL | Status: DC | PRN
  Administered 2022-04-08 – 2022-04-30 (×20): 50 mg
  Filled 2022-04-07 (×21): qty 1

## 2022-04-07 MED ORDER — VITAL AF 1.2 CAL PO LIQD
1000.0000 mL | ORAL | Status: DC
  Administered 2022-04-07: 1000 mL

## 2022-04-07 MED ORDER — TRAVASOL 10 % IV SOLN
INTRAVENOUS | Status: AC
  Filled 2022-04-07: qty 1264.8

## 2022-04-07 NOTE — Progress Notes (Signed)
Progress Note  Patient Name: Gabrielle [name] Date of Encounter: 04/07/2022  [facility] Cardiologist: Janina Mayo, MD   Subjective   No significant events overnight.  Was having some occasional abdominal discomfort.  Inpatient Medications    Scheduled Meds:  aspirin  81 mg Per Tube Daily   bacitracin   Topical TID   carvedilol  12.5 mg Per Tube BID WC   Chlorhexidine Gluconate Cloth  6 each Topical Daily   docusate  100 mg Per Tube BID   free water  100 mL Per Tube Q6H   heparin injection (subcutaneous)  5,000 Units Subcutaneous Q8H   insulin aspart  0-20 Units Subcutaneous Q4H   insulin aspart  3 Units Subcutaneous Q4H   insulin detemir  30 Units Subcutaneous BID   leptospermum manuka honey  1 application. Topical Daily   mouth rinse  15 mL Mouth Rinse BID   pantoprazole (PROTONIX) IV  40 mg Intravenous QHS   polyethylene glycol  17 g Per Tube Daily   QUEtiapine  25 mg Per Tube QHS   rosuvastatin  40 mg Per Tube Daily   sacubitril-valsartan  1 tablet Per Tube BID   sodium chloride flush  10-40 mL Intracatheter Q12H   spironolactone  25 mg Per Tube Daily   Continuous Infusions:  sodium chloride 10 mL/hr at 04/07/22 0700   feeding supplement (VITAL AF 1.2 CAL) 1,000 mL (04/06/22 1906)   magnesium sulfate bolus IVPB     TPN ADULT (ION) 85 mL/hr at 04/07/22 0700   PRN Meds: sodium chloride, acetaminophen (TYLENOL) oral liquid 160 mg/5 mL, bisacodyl, guaiFENesin-dextromethorphan, hydrALAZINE, labetalol, lip balm, magnesium sulfate bolus IVPB, ondansetron, oxyCODONE, phenol, silver nitrate applicators, sodium chloride flush, traMADol   Vital Signs    Vitals:   04/07/22 0500 04/07/22 0600 04/07/22 0700 04/07/22 0755  BP: (!) 158/110 (!) 140/47 139/62   Pulse: (!) 104 (!) 102 (!) 103   Resp: (!) 28 (!) 27 (!) 26   Temp:    98.5 F (36.9 C)  TempSrc:    Oral  SpO2: 95% 92% 94%   Weight:      Height:        Intake/Output Summary (Last 24 hours) at 04/07/2022  0923 Last data filed at 04/07/2022 0700 Gross per 24 hour  Intake 1622.59 ml  Output 1190 ml  Net 432.59 ml      04/07/2022    4:01 AM 04/06/2022    4:00 AM 04/05/2022    6:00 AM  Last 3 Weights  Weight (lbs) 128 lb 8.5 oz 130 lb 15.3 oz 130 lb 15.3 oz  Weight (kg) 58.3 kg 59.4 kg 59.4 kg      Telemetry    Sinus rhythm, no adverse arrhythmias- Personally Reviewed Physical Exam   Comfortable laying on side in bed.  Normal respiratory effort  Labs    High Sensitivity Troponin:   Recent Labs  Lab 03/23/22 1138 03/24/22 0843 03/25/22 1037 03/30/22 0057 03/30/22 0307  TROPONINIHS 3,592* 5,007* 2,820* 347* 316*     Chemistry Recent Labs  Lab 04/01/22 0440 04/01/22 0751 04/05/22 0345 04/06/22 0530 04/07/22 0400  NA 150*   < > 145 145 145  K 4.1   < > 4.0 3.7 4.0  CL 118*   < > 116* 116* 119*  CO2 28   < > '26 24 22  '$ GLUCOSE 365*   < > 206* 243* 128*  BUN 37*   < > 31* 35* 38*  CREATININE 1.14*   < > 0.76 0.73 0.73  CALCIUM 8.9   < > 9.0 8.6* 8.3*  MG 2.6*   < > 2.1 2.3 2.2  PROT 6.3*  --   --   --  6.7  ALBUMIN 2.5*  --   --   --  2.4*  AST 28  --   --   --  41  ALT 19  --   --   --  42  ALKPHOS 112  --   --   --  108  BILITOT 1.0  --   --   --  0.6  GFRNONAA 53*   < > >60 >60 >60  ANIONGAP 4*   < > 3* 5 4*   < > = values in this interval not displayed.    Lipids  Recent Labs  Lab 04/07/22 0400  TRIG 157*    Hematology Recent Labs  Lab 04/02/22 0217 04/06/22 0428 04/07/22 0442  WBC 16.6* 17.0* 17.9*  RBC 3.03* 3.11* 3.27*  HGB 8.8* 9.4* 9.4*  HCT 29.1* 31.3* 30.6*  MCV 96.0 100.6* 93.6  MCH 29.0 30.2 28.7  MCHC 30.2 30.0 30.7  RDW 14.4 15.5 14.9  PLT 354 220 435*   Thyroid No results for input(s): TSH, FREET4 in the last 168 hours.  BNPNo results for input(s): BNP, PROBNP in the last 168 hours.  DDimer No results for input(s): DDIMER in the last 168 hours.   Radiology    DG Abd 1 View  Result Date: 04/06/2022 CLINICAL DATA:  Ileus EXAM:  ABDOMEN - 1 VIEW COMPARISON:  04/05/2022 FINDINGS: Soft feeding tube remains in place within the 3rd to 4th portion of the duodenum. There is less visible intestinal gas. No worsening or new finding. IMPRESSION: Less intestinal gas than seen yesterday. No dilated small bowel loops. Soft feeding tube tip remains in the junction of the third and fourth portion of the duodenum. Electronically Signed   By: Nelson Chimes M.D.   On: 04/06/2022 08:57   DG Abd Portable 1V  Result Date: 04/07/2022 CLINICAL DATA:  Abdominal pain EXAM: PORTABLE ABDOMEN - 1 VIEW COMPARISON:  04/06/2022 FINDINGS: Tip of enteric tube is seen in the stomach. Bowel gas pattern is nonspecific. There is interval decrease in amount of small bowel gas. IMPRESSION: Tip of feeding tube is seen in the stomach. Nonspecific bowel gas pattern. Electronically Signed   By: Elmer Picker M.D.   On: 04/07/2022 08:21   DG Abd Portable 1V  Result Date: 04/06/2022 CLINICAL DATA:  NG tube placement EXAM: PORTABLE ABDOMEN - 1 VIEW COMPARISON:  None Available. FINDINGS: Feeding tube with tip in the gastric body. Tip directed towards the antral region. Stylet removed. IMPRESSION: Feeding tube within the gastric body Electronically Signed   By: Suzy Bouchard M.D.   On: 04/06/2022 16:27    Cardiac Studies   EF now 55 to 60%.  Was 20%  Patient Profile     66 y.o. female with PAD presenting with critical limb ischemia, poorly controlled diabetes mellitus, hyperlipidemia, hypertension developing acute systolic heart failure and cardiogenic shock roughly 48 hours following aortobifemoral bypass surgery and bilateral femoral enterectomy, left-sided profunda endarterectomy, performed 03/21/2022.  Echocardiogram showed acute reduction in LVEF from 55 to 60% to 20-25% from 1 day to the next 05/09 - 05/10.  Limited echo on 04/03/2022 shows normal ejection fraction 55 to 60%  Assessment & Plan    Takotsubo cardiomyopathy Type II myocardial infarction -  Continue with  supportive care.  Started Entresto 49/51 medium dose 5/23.  Carvedilol also added/increased to 12.5 twice a day previously.  Tolerating well from a blood pressure perspective.  Continue. -Also on spironolactone 25 mg.  No changes made. -Since there is improvement in EF, no need for coronary angiogram.  I do not feel strongly that an ischemic evaluation is needed given the resolution of EF.    PAD status post aortobifemoral bypass - Vascular surgery note reviewed.  Tube feeds are increasing.  He had long discussion about importance of aggressive rehab.  Sacral decubitus - Observed wound previously with wound care team.    For questions or updates, please contact [place] Please consult [email or web address] for contact info under        Signed, Candee Furbish, MD  04/07/2022, 9:23 AM

## 2022-04-07 NOTE — Progress Notes (Signed)
  Progress Note    04/07/2022 7:41 AM 17 Days Post-Op  Subjective:  Loose stools overnight. Resting comfortably this morning. When asked about abdominal pain, verbalized no.   Vitals:   04/07/22 0200 04/07/22 0300  BP: 124/60 139/67  Pulse: (!) 105 (!) 105  Resp: (!) 24 (!) 25  Temp:    SpO2: 95% 93%   Physical Exam: Lungs:  extubated; non labored on RA Incisions:  abd and groin incisions c/d/i Extremities:  Feet symmetrically warm and well perfused; stable motor and sensation deficit L LE, able to move the foot Abdomen:  distended with pain to palpation Neurologic: stable  CBC    Component Value Date/Time   WBC 17.9 (H) 04/07/2022 0442   RBC 3.27 (L) 04/07/2022 0442   HGB 9.4 (L) 04/07/2022 0442   HGB 14.5 03/22/2021 0916   HCT 30.6 (L) 04/07/2022 0442   HCT 43.9 03/22/2021 0916   PLT 435 (H) 04/07/2022 0442   PLT 261 03/22/2021 0916   MCV 93.6 04/07/2022 0442   MCV 90 03/22/2021 0916   MCH 28.7 04/07/2022 0442   MCHC 30.7 04/07/2022 0442   RDW 14.9 04/07/2022 0442   RDW 12.5 03/22/2021 0916   LYMPHSABS 2.8 02/03/2015 0527   MONOABS 0.4 02/03/2015 0527   EOSABS 0.1 02/03/2015 0527   BASOSABS 0.0 02/03/2015 0527    BMET    Component Value Date/Time   NA 145 04/07/2022 0400   NA 137 03/22/2021 0916   K 4.0 04/07/2022 0400   CL 119 (H) 04/07/2022 0400   CO2 22 04/07/2022 0400   GLUCOSE 128 (H) 04/07/2022 0400   BUN 38 (H) 04/07/2022 0400   BUN 11 03/22/2021 0916   CREATININE 0.73 04/07/2022 0400   CREATININE 0.74 11/11/2015 1921   CALCIUM 8.3 (L) 04/07/2022 0400   GFRNONAA >60 04/07/2022 0400   GFRNONAA >89 11/11/2015 1921   GFRAA 78 10/23/2020 1701   GFRAA >89 11/11/2015 1921    INR    Component Value Date/Time   INR 1.1 03/27/2022 0510     Intake/Output Summary (Last 24 hours) at 04/07/2022 0741 Last data filed at 04/07/2022 0300 Gross per 24 hour  Intake 1292.18 ml  Output 950 ml  Net 342.18 ml      Assessment/Plan:  66 y.o. female  is s/p ABF bypass complicated by post op MI and heart failure 17 Days Post-Op   Will follow up abd xray General: appears comfortable; following commands, not speaking Lungs: non labored on RA, able to take deep breaths to command Abd: loose stools, more distended this morning, KUB improved but still some dilated small bowel; continue TPN; may need to pause trickle feeds if abd exam worsens BLE warm and well perfused; L leg numbness and weakness stable  Pt's WBC elevated. Currently not on abx. Will continue to monitor. If this continues to climb will discuss scan of abd/pelvis Appears to have hypoactive delirium.   Head MR negative s/p ABF. May need rescan pending no improvement over the coming days.   Appreciate excellent nursing and care from critical care team   [facility] [phone] 04/07/2022 7:41 AM

## 2022-04-07 NOTE — Evaluation (Addendum)
Speech-Language-Cognitive Assessment  Patient Details  Name: Gabrielle [name] MRN: [id number] Date of Birth: 1956/05/09  Today's Date: 04/07/2022 Time: SLP Start Time (ACUTE ONLY): 1000 SLP Stop Time (ACUTE ONLY): 1017 SLP Time Calculation (min) (ACUTE ONLY): 17 min  Past Medical History:  Past Medical History:  Diagnosis Date   Cancer ([place]) 2010   rectal CA   Cataract    Diabetes mellitus without complication ([place])    Phreesia 11/15/2020   Eczema    Hyperlipidemia    Hypertension    Intrinsic (urethral) sphincter deficiency (ISD)    RBBB    SUI (stress urinary incontinence, female)    hx of   Type 2 diabetes mellitus ([place])    Past Surgical History:  Past Surgical History:  Procedure Laterality Date   ABDOMINAL HYSTERECTOMY  1998   fibroids, partial   ABDOMINOPLASTY  1999   ANGIOPLASTY Left 03/21/2022   Procedure: ANGIOPLASTY USING XENOSURE BIOLOGLIC PATCH ([id number]);  Surgeon: Broadus John, MD;  Location: [place];  Service: Vascular;  Laterality: Left;   AORTA - BILATERAL FEMORAL ARTERY BYPASS GRAFT N/A 03/21/2022   Procedure: AORTOBIFEMORAL BYPASS GRAFT USING HEMASHIELD GOLD GRAFT (14x51m);  Surgeon: RBroadus John MD;  Location[place]  Service: Vascular;  Laterality: N/A;   BLADDER SUSPENSION N/A 03/06/2014   [facility] ;  Surgeon: JIrine Seal MD;  [facility]  Service: Urology;  Laterality: N/A;   CARDIOVASCULAR STRESS TEST  03-27-2008   NORMAL PERFUSION STUDY/  EF 54%   COLON SURGERY  2010   for rectal ca   COLONOSCOPY     ENDARTERECTOMY FEMORAL Bilateral 03/21/2022   Procedure: ENDARTERECTOMY FEMORAL BILATERAL;  Surgeon: RBroadus John MD;  [place]  Service: Vascular;  Laterality: Bilateral;   EUS N/A 03/02/2017   Procedure: LOWER ENDOSCOPIC ULTRASOUND (EUS);  Surgeon: DMilus Banister MD;  [facility];  Service: Endoscopy;  Laterality: N/A;   EXCISION LOWER ABDOMINAL SCAR POST ABDOMINOPLASTY  01-05-2000    ORIF RIGHT ANKLE FX  12-24-2008   RIGHT ANKLE ARTHROSCOPY W/ DEBRIDEMENT AND OPEN REMOVAL HARDWARE  03-12-2010   TRANSTHORACIC ECHOCARDIOGRAM  08-05-2009   MILD LVH/  EF 60-65%   TUBAL LIGATION     HPI:  Patient is a 66y/o female who presents on 5/8 for open aortobifemoral bypass and Bilateral femoral endarterectomy complicated by MI, heart failure and shock, intubated 5/10-5/20. PMH includes rectal cancer, DM2, HTN.    Assessment / Plan / Recommendation  Clinical Impression  Gabrielle [name] a cognitive based communication deficit and assessed with spouse present. This week RN states she will interact and respond inconsistently and occasionally says a few words. Today therapist also noted inconsistency largely impacted by her cognitive presentation. She was restless throughout and had difficulty sustainng attention to therapist looking at hands, the wall and moving side to side. She followed approximately 3 simple commands out of 8 with visual cues, repetition and additional time to respond. She did not respond to most yes/no questions and would not state biographical information. Therapist wrote numbers on paper and with encouragement to read/name however she looked at numbers intermittently without verbalizations. She understood some amount of humor and appropriately smiled or looked away from therapist. ST will continue to see for cognitive-communication impairments and appears to have good prognosis with additional time. SLP Visit Diagnosis: Cognitive communication deficit (R41.841)  Other  Recommendations      Recommendations for follow up therapy are one component of a multi-disciplinary discharge planning process, led by the attending physician.  Recommendations may be updated based on patient status, additional functional criteria and insurance authorization.  Follow up Recommendations Acute inpatient rehab (3hours/day)      Assistance Recommended at  Discharge Frequent or constant Supervision/Assistance  Functional Status Assessment Patient has had a recent decline in their functional status and demonstrates the ability to make significant improvements in function in a reasonable and predictable amount of time.  Frequency and Duration min 2x/week          Prognosis

## 2022-04-07 NOTE — Progress Notes (Signed)
PHARMACY - TOTAL PARENTERAL NUTRITION CONSULT NOTE   Indication: Prolonged ileus  Patient Measurements: Height: '4\' 9"'$  (144.8 cm) Weight: 58.3 kg (128 lb 8.5 oz) IBW/kg (Calculated) : 38.6 TPN AdjBW (KG): 40.6 Body mass index is 27.81 kg/m. Usual Weight: ~118 lbs per chart review   Assessment:  66 yo W admitted with BL LE critical limb ischemia s/p aortofemoral bypass, BL femoral endarterectomy and Left-sided profunda endarterectomy with bovine pericardial patch 5/8. Patient with cardiogenic shock post-procedure now fluid overloaded. Patient with return of bowel function and multiple large stools 5/17, however, continues to have emesis and high NGT output. Imagining consistent with prolonged ileus. Patient received some trickle TF 5/11-5/13 and 5/16. While on TF (received up to 76g dextrose/24hr), patient required 41- 60 units of insulin. Patient is at risk for refeeding. Pharmacy consulted for TPN.   Of note, triglycerides are elevated at baseline (have been as high as 499 this admission while on propofol) but has been stable since TPN start. Per team, pt appears dry with Na elevated and CVP low. Ok to liberalize TPN fluids.   Glucose / Insulin: A1C 9.2. CBGs now wnl at 112-159. Used 47 units SSI with 25 units from scheduled 5 units q4h + levemir 40 BID + 132 units insulin in TPN (Max allowed in TPN)  Electrolytes: Na 145 (on FW 100 q6h), K 4.0 (s/p 20 meq Kcl), Cl 119, CoCa 9.3. Other electrolytes wnl. Renal: Scr 0.73, BUN 38 Hepatic: LFTs/Tbili wnl, albumin 2.5, TG 157 Intake / Output; MIVF: UOP 0.5 ml/kg/hr, NGT removed 5/22, Cortrak in place, 440m stool GI Imaging: 5/8 KUB: unremarkable 5/13 KUB: increased small bowel gas, ileus vs obstruction 5/14 KUB: ileus vs SBO  5/15 KUB: Similar gaseous distension of small bowel likely resolving ileus  5/17 KUB: Similar small bowel and proximal colonic distension consistent with postoperative ileus  5/23 KUB: persistent mild distended small  bowl loops; dec distention 5/24 KUB: less intestinal gas, no dilated small bowel loops 5/25 KUB: dec in small bowel gas GI Surgeries / Procedures:  5/17 NGT placement   Central access: PICC 5/17  TPN start date: 5/18  Nutritional Goals: Goal TPN rate (non-concentrated) is 85 mL/hr, 62 g/L of 10% protein, 13% dextrose, 29 g/L lipids (provides 126 g of protein, 266 g dextrose, 59 g lipids and 2000 kcals per day)  RD Assessment: Estimated Needs Total Energy Estimated Needs: 1900-2100 kcals Total Protein Estimated Needs: 110-130 g Total Fluid Estimated Needs: >/= 1.9 L  Current Nutrition:  TPN and Vital AF 1.2 at 10 ml/hr - increasing to 20 ml/hr today  Plan:  Continue TPN at goal rate of 85 mL/hr at 1800 to provide 126g AA and 2000 kcal/day. Increase in percent dextrose from 11.6% to 13% with improvement in hyperglycemia.  Electrolytes in TPN: Na 0 mEq/L, K 45 mEq/L, Ca 0 mEq/L, Mg 6 mEq/L, phos 12 mmol/L. Max acetate - no changes today.  Add standard MVI and trace elements to TPN Continue insulin in TPN at 132 units (*at max of 65 units/L) and decrease levemir to 30 BID and decrease scheduled 5 units q4h to 3 units q4h with hold parameters to hold for CBG <120 Continue resistant q4h SSI  TPN las Mon/Thurs, repeat electrolytes tomorrow Follow-up tube feed toleration and titration and ability to wean TPN   GCristela Felt PharmD, BCPS Clinical Pharmacist 04/07/2022 7:10 AM

## 2022-04-07 NOTE — Progress Notes (Signed)
NAME:  Gabrielle [name], MRN:  [id number], DOB:  1956/01/11, LOS: 79 ADMISSION DATE:  03/21/2022, CONSULTATION DATE:  03/22/22 REFERRING MD:  Unk Lightning, CHIEF COMPLAINT:  abd pain   History of Present Illness:  66 year old woman with hx of rectal cancer, DM2, HTN, HLD who presented for open aortobifemoral bypass by Dr. Unk Lightning for critical limb ischemia.  Postop course complicated by shock in the setting of stress cardiomyopathy.  Patient underwent procedure with 14-7 bifurcated dacron graft, bilateral femoral endarterectomy late 5/8pm.  EBL 1.5L.  Currently c/o SOB and abdominal discomfort. Developed respiratory distress 5/10, intubated.  ICU course complicated by ileus requiring TPN with malnutrition.  She was successfully extubated 5/20.    Pertinent  Medical History  DM2- A1c 9.3% HTN HLD Severe PVD  Significant Hospital Events: Including procedures, antibiotic start and stop dates in addition to other pertinent events   5/8 Admit, Aortobifemoral bypass using 14 x 7 bifurcated Dacron graft, Bilateral femoral endarterectomy, Left-sided profunda endarterectomy with bovine pericardial patch 5/9 PCCM consult for hypotension 5/10 worsening respiratory distress, intubated 5/11 diuresis 5/16 abdominal distention, increase bowel reg, attempts to wean from ventilator limited by low volumes 5/17 tachycardic, c/f STEMI, per cardiology no STEMI, PICC line placed for TPN 5/18 return of bowel function 5/20 extubated 5/22 Waxing/waning alertness, small bore feeding tube. Not answering questions.  5/25 No major issues overnight, mentation continues to wax and wane    Interim History / Subjective:  Continues to have liquid stool through flexi seal Mentation waxes and wanes   Objective   Blood pressure 139/67, pulse (!) 105, temperature 98.1 F (36.7 C), temperature source Axillary, resp. rate (!) 25, height '4\' 9"'$  (1.448 m), weight 58.3 kg, SpO2 93 %. CVP:  [1 mmHg-2 mmHg] 2 mmHg      Intake/Output  Summary (Last 24 hours) at 04/07/2022 4270 Last data filed at 04/07/2022 0300 Gross per 24 hour  Intake 1292.18 ml  Output 950 ml  Net 342.18 ml    Filed Weights   04/05/22 0600 04/06/22 0400 04/07/22 0401  Weight: 59.4 kg 59.4 kg 58.3 kg    Examination: General: Acute on chronically ill appearing middle aged female lying in bed, in NAD HEENT: [place]?AT, MM pink/moist, PERRL,  Neuro: Will state her name but then quickly stops responding, unable to follow any commands CV: s1s2 regular rate and rhythm, no murmur, rubs, or gallops,  PULM:  Clear to ascultation, no increased work of breathing, no added breath sounds  GI: soft, bowel sounds active in all 4 quadrants, non-tender, non-distended, tolerating trickle TF Extremities: warm/dry, no edema  Skin: no rashes or lesions  Resolved Hospital Problem list   Postoperative shock Postoperative ileus Acute hypoxemic respiratory failure AKI Acute metabolic encephalopathy/icu delirium Hypernatremia   Assessment & Plan:   Stress Cardiomyopathy with Cardiogenic Shock - resolved Acute Systolic Heart Failure  -appreciate Cardiology assistance with patient care > rec's for no LHC at this time given marked improvement in EF  P: Continuous telemetry  Continue ASA, statin, Coreg, (increase again, and spirolactone Strict intake and output  DM2 with Hyperglycemia P: Insulin increased in TPN  Continue SSI   PAD s/p Aortobifemoral bypass  HTN, HLD hx  P: Primary management per vascular  Continue ASA and statin  PT/OT   Anemia of critical illness P: Trend CBC  ICU Delirium -suspect delirium may have been "treated" with oxycodone, reduce oxy to ultram for pain control  P: Maintain neuro protective measures Nutrition and bowel regiment  Seizure precautions  Aspirations precautions   Ileus - improved on imaging yesterday but worsened distension today, having loose stools P: Continue bowel regiment  Remains on trickle feeds will  try and increased TF rate 5/26 Flexiseal in place   Sacral Tissue Injury  P: WOC and PT following  Pressure relieving devices   LLE paresis- has been there since surgery, will need to eval at some point down line P: Plan to obtain images or brain   Best Practice (right click and "Reselect all SmartList Selections" daily)  Diet/type: NPO on TPN.  Trickle feeds DVT prophylaxis: heparin ppx GI prophylaxis: N/A Lines: PICC- TPN Foley:  N/A Code Status:  full code Last date of multidisciplinary goals of care discussion: per primary.   Critical care: NA  Lelah Rennaker D. Kenton Kingfisher, NP-C [facility] Personal contact information can be found on Amion  04/07/2022, 9:33 AM

## 2022-04-08 ENCOUNTER — Inpatient Hospital Stay (HOSPITAL_COMMUNITY): Payer: BC Managed Care – PPO

## 2022-04-08 DIAGNOSIS — I739 Peripheral vascular disease, unspecified: Secondary | ICD-10-CM | POA: Diagnosis not present

## 2022-04-08 LAB — GLUCOSE, CAPILLARY
Glucose-Capillary: 175 mg/dL — ABNORMAL HIGH (ref 70–99)
Glucose-Capillary: 184 mg/dL — ABNORMAL HIGH (ref 70–99)
Glucose-Capillary: 197 mg/dL — ABNORMAL HIGH (ref 70–99)
Glucose-Capillary: 203 mg/dL — ABNORMAL HIGH (ref 70–99)
Glucose-Capillary: 212 mg/dL — ABNORMAL HIGH (ref 70–99)
Glucose-Capillary: 214 mg/dL — ABNORMAL HIGH (ref 70–99)
Glucose-Capillary: 447 mg/dL — ABNORMAL HIGH (ref 70–99)

## 2022-04-08 LAB — BASIC METABOLIC PANEL
Anion gap: 9 (ref 5–15)
BUN: 38 mg/dL — ABNORMAL HIGH (ref 8–23)
CO2: 19 mmol/L — ABNORMAL LOW (ref 22–32)
Calcium: 8.7 mg/dL — ABNORMAL LOW (ref 8.9–10.3)
Chloride: 114 mmol/L — ABNORMAL HIGH (ref 98–111)
Creatinine, Ser: 0.73 mg/dL (ref 0.44–1.00)
GFR, Estimated: >60 mL/min (ref >60)
Glucose, Bld: 200 mg/dL — ABNORMAL HIGH (ref 70–99)
Potassium: 4.4 mmol/L (ref 3.5–5.1)
Sodium: 142 mmol/L (ref 135–145)

## 2022-04-08 LAB — CBC
HCT: 33.6 % — ABNORMAL LOW (ref 36.0–46.0)
Hemoglobin: 10.5 g/dL — ABNORMAL LOW (ref 12.0–15.0)
MCH: 29 pg (ref 26.0–34.0)
MCHC: 31.3 g/dL (ref 30.0–36.0)
MCV: 92.8 fL (ref 80.0–100.0)
Platelets: 446 10*3/uL — ABNORMAL HIGH (ref 150–400)
RBC: 3.62 MIL/uL — ABNORMAL LOW (ref 3.87–5.11)
RDW: 15 % (ref 11.5–15.5)
WBC: 18.1 10*3/uL — ABNORMAL HIGH (ref 4.0–10.5)
nRBC: 0.2 % (ref 0.0–0.2)

## 2022-04-08 LAB — APTT: aPTT: 24 seconds (ref 24–36)

## 2022-04-08 LAB — PHOSPHORUS: Phosphorus: 3 mg/dL (ref 2.5–4.6)

## 2022-04-08 LAB — MRSA NEXT GEN BY PCR, NASAL: MRSA by PCR Next Gen: NOT DETECTED

## 2022-04-08 LAB — PROCALCITONIN: Procalcitonin: 0.21 ng/mL

## 2022-04-08 LAB — MAGNESIUM: Magnesium: 2.3 mg/dL (ref 1.7–2.4)

## 2022-04-08 MED ORDER — VITAL AF 1.2 CAL PO LIQD
1000.0000 mL | ORAL | Status: DC
  Administered 2022-04-08: 1000 mL

## 2022-04-08 MED ORDER — TRAVASOL 10 % IV SOLN
INTRAVENOUS | Status: AC
  Filled 2022-04-08: qty 669.6

## 2022-04-08 NOTE — TOC Progression Note (Signed)
Transition of Care [facility]) - Progression Note    Patient Details  Name: Gabrielle [name] MRN: [id number] Date of Birth: 1956/10/20  [facility] CM/SW Contact  Graves-Bigelow, Ocie Cornfield, RN Phone Number: 04/08/2022, 2:09 PM  Clinical Narrative:  Case Manager discussed with patients spouse that [employer] has submitted clinicals for insurance authorization for LTAC. Awaiting to hear back from insurance for authorization. Case Manager will continue to follow for additional transition of care needs.   Expected Discharge Plan: Long Term Acute Care (LTAC) Barriers to Discharge: Continued Medical Work up  Expected Discharge Plan and Services Expected Discharge Plan: Long Term Acute Care (LTAC)   Discharge Planning Services: CM Consult Post Acute Care Choice: Long Term Acute Care (LTAC) Living arrangements for the past 2 months: Single Family Home                   DME Agency: NA       HH Arranged: NA    Readmission Risk Interventions     View : No data to display.

## 2022-04-08 NOTE — Progress Notes (Signed)
Nutrition Follow-up  DOCUMENTATION CODES:   Severe malnutrition in context of acute illness/injury  INTERVENTION:   Recommend continuing TPN until TF meeting >75% of estimated needs and being titrated to goal successfully   Continue TPN  -RD increased nutritional needs 5/24 given significant wound requiring hydrotherapy, malnutrition.  - Discussed with TPN Pharmacists, rate to be decreased tonight as TF are titrated to goal over the weekend   Advance Tube Feeding via Cortrak slowly while monitoring for tolerance: Vital AF 1.2, Goal rate of 70 ml/hr Goal regimen provides 1872 kcals, 117 g of protein and 1264 mL of free water   Once TF at goal, will add Juven BID, each packet provides 80 calories, 8 grams of carbohydrate, 2.5  grams of protein (collagen), 7 grams of L-arginine and 7 grams of L-glutamine; supplement contains CaHMB, Vitamins C, E, B12 and Zinc to promote wound healing  NUTRITION DIAGNOSIS:   Severe Malnutrition related to acute illness as evidenced by mild fat depletion, moderate muscle depletion, energy intake < or equal to 50% for > or equal to 5 days.  Being addressed via TPN and TF  GOAL:   Patient will meet greater than or equal to 90% of their needs  Met via nutrition support  MONITOR:   Vent status, Weight trends, Labs, TF tolerance  REASON FOR ASSESSMENT:   Consult, Ventilator Enteral/tube feeding initiation and management  ASSESSMENT:   66 yo female admitted for aortobifemoral bypass due to right-sided Rutherford 4 critical limb ischemia, left-sided Rutherford 3 critical limb ischemia.  Pt developed postop shock and respiratory failure requiring intubation.  PMH includes DM, HTN, HLD, severe PAD  5/08 Aortobifemoral bypass using 14 x 7 bifurcated Dacron graft, Bilateral femoral endarterectomy,  Left-sided profunda endarterectomy with bovine pericardial patch 5/09 PCCM consult for hypotension 5/10 Worsening respiratory status requiring  intubation 5/11 Trickle TF initiated 5/12 Initiated TF titration 5/13 TF decreased back to trickle TF given abd distention: abd xray with dilated SB 5/17 TF continued at trickle TF rate until pt experienced large volume emesis with additional NG suction 1.5 L out, TPN recommended, Cortrak placed-gastric, unable to get PP 5/18 TPN initiated 5/20 Extubated  5/23 Abd xray with decreasing distention in SB and colon, Cortrak with tip in duodenum 5/25 XR shows cortrak tube in stomach  Pt resting in bed at the time of assessment. Cortrak tube in place with TF infusing at 52m/h.   Discussed nutrition plan with pharmacist. Pt's TF were increased to 240mh 5/25 and 3553mhis AM. Appears to be tolerating. TPN to be decrease tonight to meet ~50% of needs. CCM plans to further increase TF to 68m74mmorrow and then to goal of 70 on Sunday. TPN will be weaned as TF increase.  Discussed stool output with RN as when trickles were started, excessive amount of output occurred. RN reports that as of noon, there had been 268mL19moutput, volume appears to be decreasing.   Would recommend continuing supplemental TPN until TF are tolerated and meeting at least 75% of estimated needs.    Current TPN: 85 mL/hr provides 126g AA and 2000 kcal/day TPN to be changed to: 45ml/58mo provide 67g AA and 1059 kcal (5/26 1800)  Current TF: Vital AF 1.2 @ 35mL/h67m0mL fo35ma) provides 1008kcal and 63g of protein   Intake/Output Summary (Last 24 hours) at 04/08/2022 1546 Last data filed at 04/08/2022 1400 Gross per 24 hour  Intake 3162.91 ml  Output 1450 ml  Net 1712.91 ml  Net IO  Since Admission: 5,740.72 mL [04/08/22 1546]  Nutritionally Relevant Medications: Scheduled Meds:  docusate  100 mg Per Tube BID   free water  100 mL Per Tube Q6H   insulin aspart  0-20 Units Subcutaneous Q4H   insulin aspart  3 Units Subcutaneous Q4H   insulin detemir  30 Units Subcutaneous BID   pantoprazole IV  40 mg Intravenous QHS    polyethylene glycol  17 g Per Tube Daily   rosuvastatin  40 mg Per Tube Daily   spironolactone  25 mg Per Tube Daily   Continuous Infusions:  feeding supplement (VITAL AF 1.2 CAL) 1,000 mL (04/08/22 1146)   magnesium sulfate bolus IVPB     TPN ADULT (ION) 85 mL/hr at 04/08/22 1400   TPN ADULT (ION)     PRN Meds: bisacodyl, magnesium sulfate bolus IVPB, ondansetron  Labs Reviewed: CBG ranges from 135-197 mg/dL over the last 24 hours HgbA1c 9.2% (5/1)  Diet Order:   Diet Order             Diet NPO time specified  Diet effective now                   EDUCATION NEEDS:   Not appropriate for education at this time  Skin:  Skin Assessment: Skin Integrity Issues: Skin Integrity Issues:: Unstageable DTI: perineum Unstageable: sacrococcygeal: seen by WOC  Last BM:  5/26 - type 7, rectal tube in place  Height:   Ht Readings from Last 1 Encounters:  03/24/22 4' 9"  (1.448 m)    Weight:   Wt Readings from Last 1 Encounters:  04/08/22 61.2 kg    BMI:  Body mass index is 29.2 kg/m.  Estimated Nutritional Needs:   Kcal:  1900-2100 kcals  Protein:  110-130 g  Fluid:  >/= 1.9 L  Kerman Passey MS, RDN, LDN, CNSC Registered Dietitian III Clinical Nutrition RD Pager and On-Call Pager Number Located in [place]

## 2022-04-08 NOTE — Progress Notes (Signed)
NAME:  Gabrielle [name], MRN:  [id number], DOB:  04-08-1956, LOS: 6 ADMISSION DATE:  03/21/2022, CONSULTATION DATE:  03/22/22 REFERRING MD:  Unk Lightning, CHIEF COMPLAINT:  abd pain   History of Present Illness:  66 year old woman with hx of rectal cancer, DM2, HTN, HLD who presented for open aortobifemoral bypass by Dr. Unk Lightning for critical limb ischemia.  Postop course complicated by shock in the setting of stress cardiomyopathy.  Patient underwent procedure with 14-7 bifurcated dacron graft, bilateral femoral endarterectomy late 5/8pm.  EBL 1.5L.  Currently c/o SOB and abdominal discomfort. Developed respiratory distress 5/10, intubated.  ICU course complicated by ileus requiring TPN with malnutrition.  She was successfully extubated 5/20.    Pertinent  Medical History  DM2- A1c 9.3% HTN HLD Severe PVD  Significant Hospital Events: Including procedures, antibiotic start and stop dates in addition to other pertinent events   5/8 Admit, Aortobifemoral bypass using 14 x 7 bifurcated Dacron graft, Bilateral femoral endarterectomy, Left-sided profunda endarterectomy with bovine pericardial patch 5/9 PCCM consult for hypotension 5/10 worsening respiratory distress, intubated 5/11 diuresis 5/16 abdominal distention, increase bowel reg, attempts to wean from ventilator limited by low volumes 5/17 tachycardic, c/f STEMI, per cardiology no STEMI, PICC line placed for TPN 5/18 return of bowel function 5/20 extubated 5/22 Waxing/waning alertness, small bore feeding tube. Not answering questions.  5/25 No major issues overnight, mentation continues to wax and wane  5/26 Head CT pending    Interim History / Subjective:  She remains alert and mildly interactive but verbally does not respond apart from stating her name   Objective   Blood pressure (!) 116/53, pulse (!) 102, temperature 97.8 F (36.6 C), temperature source Axillary, resp. rate (!) 31, height '4\' 9"'$  (1.448 m), weight 61.2 kg, SpO2 98 %.         Intake/Output Summary (Last 24 hours) at 04/08/2022 0720 Last data filed at 04/08/2022 0600 Gross per 24 hour  Intake 2854.33 ml  Output 1100 ml  Net 1754.33 ml    Filed Weights   04/06/22 0400 04/07/22 0401 04/08/22 0600  Weight: 59.4 kg 58.3 kg 61.2 kg    Examination: General: Acute on chronically ill appearing middle aged female lying in bed, in NAD HEENT: [place]/AT MM pink/moist, PERRL,  Neuro: Spontaneously awake and again states her name but unable to follow commands, limited to no movement seen to lower extremity  CV: s1s2 regular rate and rhythm, no murmur, rubs, or gallops,  PULM:  Clear to ascultation, no increased work of breathing, no added breath sounds  GI: soft, bowel sounds active in all 4 quadrants, non-tender, non-distended, tolerating TF and TPN Extremities: warm/dry, no edema  Skin: no rashes or lesions  Resolved Hospital Problem list   Postoperative shock Postoperative ileus Acute hypoxemic respiratory failure AKI Acute metabolic encephalopathy/icu delirium Hypernatremia   Assessment & Plan:   Stress Cardiomyopathy with Cardiogenic Shock - resolved Acute Systolic Heart Failure  -appreciate Cardiology assistance with patient care > rec's for no LHC at this time given marked improvement in EF  P: Continuous telemetry  Continue ASA, statin, coreg, and spironolactone Strict intake and output    DM2 with Hyperglycemia P: Continue to utilize insulin in TPN to assist in management of CBG Continue SSI   PAD s/p Aortobifemoral bypass  HTN, HLD hx  P: Primary management per Vascular  Continue ASA and statin  PT/OT  Anemia of critical illness P: Trend CBC   ICU Delirium -suspect delirium may have been "  treated" with oxycodone, reduce oxy to ultram for pain control  P: Obtain head CT Maintain neuro protective measures Nutrition and bowel regiment  Seizure precautions  Aspirations precautions   Ileus - improved on imaging yesterday but  worsened distension today, having loose stools P: Continue bowel regiment  Remains on trickle tube feed will advance again today to a rate of 81ms/hr  Sacral Tissue Injury  P: WOC/PT following  Pressure alleviating devices/measures Optimize nutrition   LLE paresis- has been there since surgery, will need to eval at some point down line P: Head CT as above   Best Practice (right click and "Reselect all SmartList Selections" daily)  Diet/type: NPO on TPN.  Trickle feeds DVT prophylaxis: heparin ppx GI prophylaxis: N/A Lines: PICC- TPN Foley:  N/A Code Status:  full code Last date of multidisciplinary goals of care discussion: per primary.   Critical care: NA  Taim Wurm D. HKenton Kingfisher NP-C [facility] Personal contact information can be found on Amion  04/08/2022, 7:20 AM

## 2022-04-08 NOTE — Progress Notes (Signed)
Physical Therapy Wound Treatment Patient Details  Name: Gabrielle [name] MRN: [id number] Date of Birth: Jan 31, 1956  Today's Date: 04/08/2022 Time: 0962-8366 Time Calculation (min): 39 min  Subjective  Subjective Assessment Subjective: Pt attempted to verbally express herself initially, but unable to comprehend her. Pt nodding/shaking head to answer question/agree/disagree throughout remaining session. Patient and Family Stated Goals: not stated Date of Onset:  (unknown) Prior Treatments: dressing changes  Pain Score:  Premedicated, tolerated pain fairly well only grimacing occasionally, but shaking head "no" when asked if she had any pain  Wound Assessment  Pressure Injury 03/30/22 Sacrum Posterior;Right;Left Deep Tissue Pressure Injury - Purple or maroon localized area of discolored intact skin or blood-filled blister due to damage of underlying soft tissue from pressure and/or shear. large deep tissue inj (Active)  Wound Image  04/05/22 1232  Dressing Type Foam - Lift dressing to assess site every shift;Barrier Film (skin prep);Gauze (Comment);Other (Comment) 04/08/22 0859  Dressing Clean, Dry, Intact 04/08/22 0859  Dressing Change Frequency Daily 04/08/22 0859  State of Healing Eschar 04/08/22 0859  Site / Wound Assessment Brown;Yellow;Pink;Pale 04/08/22 0859  % Wound base Red or Granulating 5% 04/08/22 0859  % Wound base Yellow/Fibrinous Exudate 15% 04/08/22 0859  % Wound base Black/Eschar 80% 04/08/22 0859  % Wound base Other/Granulation Tissue (Comment) 0% 04/08/22 0859  Peri-wound Assessment Erythema (non-blanchable);Pink 04/08/22 0859  Wound Length (cm) 7 cm 04/05/22 1232  Wound Width (cm) 12 cm 04/05/22 1232  Wound Depth (cm) 0 cm 04/05/22 1232  Wound Surface Area (cm^2) 84 cm^2 04/05/22 1232  Wound Volume (cm^3) 0 cm^3 04/05/22 1232  Margins Unattached edges (unapproximated) 04/08/22 0859  Drainage Amount Minimal 04/08/22 0859  Drainage Description Serosanguineous  04/08/22 0859  Treatment Debridement (Selective);Hydrotherapy (Pulse lavage);Packing (Dry gauze);Other (Comment) 04/08/22 0859      Hydrotherapy Pulsed lavage therapy - wound location: sacral Pulsed Lavage with Suction (psi): 12 psi Pulsed Lavage with Suction - Normal Saline Used: 1000 mL Pulsed Lavage Tip: Tip with splash shield Selective Debridement Selective Debridement - Location: sacral Selective Debridement - Tools Used: Forceps, Scalpel Selective Debridement - Tissue Removed: Black and yellow necrotic tissue with some necrotic adipose tissue    Wound Assessment and Plan  Wound Therapy - Assess/Plan/Recommendations Wound Therapy - Clinical Statement: The pt's wound bed is making gradual progress with hydrotherapy, displaying decreased percentage of black eschar tissue. Pt was premedicated and tolerated the pain fairly well, grimacing only occasionally. However, pt was very restless and often rotating hips or trying to reach her R UE back to her buttocks, thus had to pause often and cease debridement as her restlessness increased. Pt would benefit from further hydrotherapy to remove necrotic tissue and promote wound healing. Wound Therapy - Functional Problem List: functional debility after prolonged time in bed. Factors Delaying/Impairing Wound Healing: Immobility, Multiple medical problems, Vascular compromise, Diabetes Mellitus Hydrotherapy Plan: Dressing change, Debridement, Pulsatile lavage with suction, Patient/family education Wound Therapy - Frequency: 3X / week Wound Therapy - Current Recommendations: PT Wound Therapy - Follow Up Recommendations: dressing changes by RN, dressing changes by family/patient  Wound Therapy Goals- Improve the function of patient's integumentary system by progressing the wound(s) through the phases of wound healing (inflammation - proliferation - remodeling) by: Wound Therapy Goals - Improve the function of patient's integumentary system by  progressing the wound(s) through the phases of wound healing by: Decrease Necrotic Tissue to: 10 percent Decrease Necrotic Tissue - Progress: Progressing toward goal Increase Granulation Tissue to: 90% Increase Granulation Tissue -  Progress: Progressing toward goal Improve Drainage Characteristics: Min, Serous Improve Drainage Characteristics - Progress: Progressing toward goal Goals/treatment plan/discharge plan were made with and agreed upon by patient/family: Yes Time For Goal Achievement: 7 days Wound Therapy - Potential for Goals: Good  Goals will be updated until maximal potential achieved or discharge criteria met.  Discharge criteria: when goals achieved, discharge from hospital, MD decision/surgical intervention, no progress towards goals, refusal/missing three consecutive treatments without notification or medical reason.  GP     Charges PT Wound Care Charges $Wound Debridement up to 20 cm: < or equal to 20 cm $ Wound Debridement each add'l 20 sqcm: 3 $PT PLS Gun and Tip: 1 Supply $PT Hydrotherapy Visit: 1 Visit     Moishe Spice, PT, DPT Acute Rehabilitation Services  Pager: [phone] Office: [place] 04/08/2022, 12:35 PM

## 2022-04-08 NOTE — Progress Notes (Signed)
Occupational Therapy Treatment Patient Details Name: Gabrielle [name] MRN: [id number] DOB: 29-Jul-1956 Today's Date: 04/08/2022   History of present illness Pt is a 66 y.o. female admitted 03/21/22 for open aortobifemoral bypass and bilateral femoral endarterectomy, complicated by MI, HF, shock. ETT 5/10-5/20. Pt with waxing/waning mentation; head CT 5/26 negative for acute abnormality. Pt also with sacral wound. PMH includes rectal CA, DM2, HTN   OT comments  Pt progressing towards OT goals today (continues to be limited by cognition, restlessness, generalized weakness). Session focused on functional transfers and seated balance in preparation for ADL. Pt mod A +2 to come EOB, once sitting EOB able to maintain brief moments of seated balance at close min guard but also with multiple LOB requiring mod A to correct. Pt very restless so did not take off mitts today for line safety. Pt with good initiation into standing, but ultimately required total A +2 to pivot to the recliner. Lift pad in place for RN staff. Once in the chair Pt continues to be restless, RN aware and in room. Pt demonstrating simple one step commands with multimodal cues at 50% throughout session with extra time for processing. OT will continue to follow acutely, POC remains appropriate.    Recommendations for follow up therapy are one component of a multi-disciplinary discharge planning process, led by the attending physician.  Recommendations may be updated based on patient status, additional functional criteria and insurance authorization.    Follow Up Recommendations  Skilled nursing-short term rehab (<3 hours/day)    Assistance Recommended at Discharge Frequent or constant Supervision/Assistance  Patient can return home with the following  Two people to help with walking and/or transfers;Two people to help with bathing/dressing/bathroom;Direct supervision/assist for medications management;Direct supervision/assist for financial  management;Assist for transportation;Help with stairs or ramp for entrance   Equipment Recommendations  BSC/3in1;Other (comment) (defer to next venue of care)    Recommendations for Other Services PT consult;Speech consult    Precautions / Restrictions Precautions Precautions: Fall;Other (comment) Precaution Comments: cortrak, bilateral soft mitts, sacral wound Restrictions Weight Bearing Restrictions: No       Mobility Bed Mobility Overal bed mobility: Needs Assistance Bed Mobility: Rolling, Sidelying to Sit Rolling: Mod assist Sidelying to sit: Mod assist, +2 for physical assistance, HOB elevated       General bed mobility comments: pt received performing partial rolls to L with attempted use of bed rail though wearing mitts (suspect in attempts to offload painful buttocks); modA to roll towards R side and elevate trunk, pt initiating scooting self towards EOB. in recliner, restlessly/impulsively leaning towards R/L without assist    Transfers Overall transfer level: Needs assistance Equipment used: 2 person hand held assist Transfers: Sit to/from Stand, Bed to chair/wheelchair/BSC Sit to Stand: Max assist, +2 physical assistance   Squat pivot transfers: Max assist, +2 physical assistance, Total assist       General transfer comment: pt initiating stand, improved ability to clear buttocks on first standing trial with maxA+2 for stability (some difficulty from EOB due to pt's short height); pt not initiating steps, ultimately requiring totalA to complete pivot transfer to recliner     Balance Overall balance assessment: Needs assistance Sitting-balance support: Feet unsupported, Single extremity supported, Bilateral upper extremity supported, No upper extremity supported Sitting balance-Leahy Scale: Poor Sitting balance - Comments: can maintain static sitting EOB with UE support and close min guard; intermittent LOB in all directions requiring min-modA to  correct Postural control: Posterior lean Standing balance support: During functional activity  Standing balance-Leahy Scale: Zero                             ADL either performed or assessed with clinical judgement   ADL Overall ADL's : Needs assistance/impaired Eating/Feeding: NPO   Grooming: Total assistance               Lower Body Dressing: Total assistance;Bed level   Toilet Transfer: Total assistance;+2 for physical assistance;+2 for safety/equipment;Squat-pivot             General ADL Comments: currently max to total A for ADL due to impaired cognition, pain, general deconditioning/generalized weakness    Extremity/Trunk Assessment Upper Extremity Assessment Upper Extremity Assessment: Generalized weakness (bil mitts on, moves against gravity and utilizing grasp through mitts on some occasions)            Vision   Vision Assessment?: Vision impaired- to be further tested in functional context Additional Comments: poor visual attention, conjugate gaze   Perception     Praxis      Cognition Arousal/Alertness: Awake/alert Behavior During Therapy: Flat affect, Restless, Impulsive Overall Cognitive Status: Impaired/Different from baseline Area of Impairment: Attention, Following commands, Safety/judgement, Awareness, Problem solving                 Orientation Level: Disoriented to, Situation, Time, Place Current Attention Level: Focused   Following Commands: Follows one step commands inconsistently, Follows one step commands with increased time Safety/Judgement: Decreased awareness of safety, Decreased awareness of deficits Awareness: Intellectual Problem Solving: Slow processing, Decreased initiation, Difficulty sequencing, Requires verbal cues, Requires tactile cues General Comments: pt with minimal verbalizations during sessions - occasional yes/no to questions, states "appreciate it" when offerred to tie gown; pt rolling eyes in  response to 'therapy' introduction and initially smiling with some jokes; this interaction decreased during session and pt becoming more restless with less command following, suspect related to increasing fatigue        Exercises Other Exercises Other Exercises: attempted sitting balance tasks sitting EOB, including maintaining static midline posture and corrective leans - difficult to perform due to pt's restlessness compromising multiple lines; pt impulsively leaning R/L multiple times in recliner (suspect in attempts to offload painful buttocks), but minimally doing so to commands requiring maxA and verbal cues for repositioning in recliner    Shoulder Instructions       General Comments SpO2 98% on RA, HR 91; pt left in recliner at EOB with geomat and pillows to encourage pressure relief in seated position, recommend return to bed with maximove; RN present and aware    Pertinent Vitals/ Pain       Pain Assessment Pain Assessment: Faces Faces Pain Scale: Hurts little more Pain Location: generalized with movement, suspect sacrum/buttocks due to wound Pain Descriptors / Indicators: Grimacing, Guarding, Discomfort Pain Intervention(s): Monitored during session, Repositioned, Other (comment) (geomat used for sacral wound)  Home Living                                          Prior Functioning/Environment              Frequency  Min 2X/week        Progress Toward Goals  OT Goals(current goals can now be found in the care plan section)  Progress towards OT goals: Progressing toward goals  Acute Rehab  OT Goals Patient Stated Goal: none stated OT Goal Formulation: Patient unable to participate in goal setting Time For Goal Achievement: 04/16/22 Potential to Achieve Goals: [place] Discharge plan remains appropriate    Co-evaluation    PT/OT/SLP Co-Evaluation/Treatment: Yes Reason for Co-Treatment: Complexity of the patient's impairments  (multi-system involvement) PT goals addressed during session: Mobility/safety with mobility;Balance;Strengthening/ROM OT goals addressed during session: ADL's and self-care;Strengthening/ROM      AM-PAC OT "6 Clicks" Daily Activity     Outcome Measure   Help from another person eating meals?: Total Help from another person taking care of personal grooming?: Total Help from another person toileting, which includes using toliet, bedpan, or urinal?: Total Help from another person bathing (including washing, rinsing, drying)?: Total Help from another person to put on and taking off regular upper body clothing?: Total Help from another person to put on and taking off regular lower body clothing?: Total 6 Click Score: 6    End of Session Equipment Utilized During Treatment: Gait belt  OT Visit Diagnosis: Unsteadiness on feet (R26.81);Other abnormalities of gait and mobility (R26.89);Muscle weakness (generalized) (M62.81);Other symptoms and signs involving cognitive function Pain - part of body:  (sacrum)   Activity Tolerance Patient tolerated treatment well   Patient Left in chair;with call bell/phone within reach;with chair alarm set   Nurse Communication Mobility status;Need for lift equipment        Time: 8676-1950 OT Time Calculation (min): 26 min  Charges: OT General Charges $OT Visit: 1 Visit OT Treatments $Self Care/Home Management : 8-22 mins  Jesse Sans OTR/L Acute Rehabilitation Services Pager: [phone] Office: [place] 04/08/2022, 12:42 PM

## 2022-04-08 NOTE — Progress Notes (Signed)
Physical Therapy Treatment Patient Details Name: Gabrielle [name] MRN: [id number] DOB: 1955/12/25 Today's Date: 04/08/2022   History of Present Illness Pt is a 66 y.o. female admitted 03/21/22 for open aortobifemoral bypass and bilateral femoral endarterectomy, complicated by MI, HF, shock. ETT 5/10-5/20. Pt with waxing/waning mentation; head CT 5/26 negative for acute abnormality. Pt also with sacral wound. PMH includes rectal CA, DM2, HTN   PT Comments    Pt slowly progressing with mobility. Today's session focused on sitting tolerance and standing trials. Pt able to maintain unsupported static sitting EOB for brief periods, though limited by restlessness with lines and (suspected) sacral wound discomfort. Pt with improved initiation to stand, requiring maxA+2 to [name] for pivot transfer to recliner. Pt with only a few verbalizations this session (yes/no, "appreciate it" when offered to tie gown). Pt remains limited by generalized weakness, decreased activity tolerance, poor balance strategies/postural reactions and impaired cognition. Continue to recommend SNF-level therapies to maximize functional mobility and independence prior to return home.    Recommendations for follow up therapy are one component of a multi-disciplinary discharge planning process, led by the attending physician.  Recommendations may be updated based on patient status, additional functional criteria and insurance authorization.  Follow Up Recommendations  Skilled nursing-short term rehab (<3 hours/day)     Assistance Recommended at Discharge Frequent or constant Supervision/Assistance  Patient can return home with the following Two people to help with walking and/or transfers;Two people to help with bathing/dressing/bathroom;Assistance with cooking/housework;Assist for transportation;Direct supervision/assist for financial management;Help with stairs or ramp for entrance;Direct supervision/assist for medications  management   Equipment Recommendations  Wheelchair (measurements PT);Wheelchair cushion (measurements PT);Hospital bed;Other (comment) (lift equipment)    Recommendations for Other Services       Precautions / Restrictions Precautions Precautions: Fall;Other (comment) Precaution Comments: cortrak, bilateral soft mitts, sacral wound Restrictions Weight Bearing Restrictions: No     Mobility  Bed Mobility Overal bed mobility: Needs Assistance Bed Mobility: Rolling, Sidelying to Sit Rolling: Mod assist Sidelying to sit: Mod assist, +2 for physical assistance, HOB elevated       General bed mobility comments: pt received performing partial rolls to L with attempted use of bed rail though wearing mitts (suspect in attempts to offload painful buttocks); modA to roll towards R side and elevate trunk, pt initiating scooting self towards EOB. in recliner, restlessly/impulsively leaning towards R/L without assist    Transfers Overall transfer level: Needs assistance Equipment used: 2 person hand held assist Transfers: Sit to/from Stand, Bed to chair/wheelchair/BSC Sit to Stand: Max assist, +2 physical assistance     Squat pivot transfers: Max assist, +2 physical assistance, Total assist     General transfer comment: pt initiating stand, improved ability to clear buttocks on first standing trial with maxA+2 for stability (some difficulty from EOB due to pt's short height); pt not initiating steps, ultimately requiring totalA to complete pivot transfer to recliner    Ambulation/Gait                   Stairs             Wheelchair Mobility    Modified Rankin (Stroke Patients Only)       Balance Overall balance assessment: Needs assistance Sitting-balance support: Feet unsupported, Single extremity supported, Bilateral upper extremity supported, No upper extremity supported Sitting balance-Leahy Scale: Poor Sitting balance - Comments: can maintain static  sitting EOB with UE support and close min guard; intermittent LOB in all directions requiring min-modA to  correct   Standing balance support: During functional activity Standing balance-Leahy Scale: Zero                              Cognition Arousal/Alertness: Awake/alert Behavior During Therapy: Flat affect, Restless, Impulsive Overall Cognitive Status: Impaired/Different from baseline Area of Impairment: Attention, Following commands, Safety/judgement, Awareness, Problem solving                   Current Attention Level: Focused   Following Commands: Follows one step commands inconsistently, Follows one step commands with increased time Safety/Judgement: Decreased awareness of safety, Decreased awareness of deficits Awareness: Intellectual Problem Solving: Slow processing, Decreased initiation, Difficulty sequencing, Requires verbal cues, Requires tactile cues General Comments: pt with minimal verbalizations during sessions - occasional yes/no to questions, states "appreciate it" when offerred to tie gown; pt rolling eyes in response to 'physical therapy' introduction and initially smiling with some jokes; this interaction decreased during session and pt becoming more restless with less command following, suspect related to increasing fatigue        Exercises Other Exercises Other Exercises: attempted sitting balance tasks sitting EOB, including maintaining static midline posture and corrective leans - difficult to perform due to pt's restlessness compromising multiple lines; pt impulsively leaning R/L multiple times in recliner (suspect in attempts to offload painful buttocks), but minimally doing so to commands requiring maxA and verbal cues for repositioning in recliner    General Comments General comments (skin integrity, edema, etc.): SpO2 98% on RA, HR 91; pt left in recliner at EOB with geomat and pillows to encourage pressure relief in seated position,  recommend return to bed with maximove; RN present and aware      Pertinent Vitals/Pain Pain Assessment Pain Assessment: Faces Faces Pain Scale: Hurts little more Pain Location: generalized with movement, suspect sacrum/buttocks due to wound Pain Descriptors / Indicators: Grimacing, Guarding, Discomfort Pain Intervention(s): Monitored during session, Limited activity within patient's tolerance, Repositioned    Home Living                          Prior Function            PT Goals (current goals can now be found in the care plan section) Progress towards PT goals: Progressing toward goals (slowly)    Frequency    Min 2X/week      PT Plan Current plan remains appropriate    Co-evaluation              AM-PAC PT "6 Clicks" Mobility   Outcome Measure  Help needed turning from your back to your side while in a flat bed without using bedrails?: A Lot Help needed moving from lying on your back to sitting on the side of a flat bed without using bedrails?: Total Help needed moving to and from a bed to a chair (including a wheelchair)?: Total Help needed standing up from a chair using your arms (e.g., wheelchair or bedside chair)?: Total Help needed to walk in hospital room?: Total Help needed climbing 3-5 steps with a railing? : Total 6 Click Score: 7    End of Session Equipment Utilized During Treatment: Gait belt Activity Tolerance: Patient tolerated treatment well;Patient limited by fatigue Patient left: in chair;with call bell/phone within reach;with nursing/sitter in room Nurse Communication: Mobility status;Need for lift equipment PT Visit Diagnosis: Muscle weakness (generalized) (M62.81);Other abnormalities of gait and mobility (R26.89)  Time: 3013-1438 PT Time Calculation (min) (ACUTE ONLY): 24 min  Charges:  $Therapeutic Activity: 8-22 mins                     Mabeline Caras, PT, DPT Acute Rehabilitation Services  Pager  [phone] Office [place] 04/08/2022, 12:23 PM

## 2022-04-08 NOTE — Progress Notes (Signed)
Progress Note  Patient Name: Gabrielle [name] Date of Encounter: 04/09/2022  [facility] Cardiologist: Janina Mayo, MD   Subjective   Tired today. States she is sick of being in the hospital.   BP mainly low 100s HR 90s  Inpatient Medications    Scheduled Meds:  aspirin  81 mg Per Tube Daily   bacitracin   Topical TID   carvedilol  25 mg Per Tube BID WC   Chlorhexidine Gluconate Cloth  6 each Topical Daily   docusate  100 mg Per Tube BID   free water  100 mL Per Tube Q6H   heparin injection (subcutaneous)  5,000 Units Subcutaneous Q8H   insulin aspart  0-20 Units Subcutaneous Q4H   insulin aspart  3 Units Subcutaneous Q4H   insulin detemir  30 Units Subcutaneous BID   leptospermum manuka honey  1 application. Topical Daily   mouth rinse  15 mL Mouth Rinse BID   pantoprazole (PROTONIX) IV  40 mg Intravenous QHS   polyethylene glycol  17 g Per Tube Daily   QUEtiapine  25 mg Per Tube QHS   rosuvastatin  40 mg Per Tube Daily   sacubitril-valsartan  1 tablet Per Tube BID   sodium chloride flush  10-40 mL Intracatheter Q12H   spironolactone  25 mg Per Tube Daily   Continuous Infusions:  sodium chloride Stopped (04/09/22 0351)   feeding supplement (VITAL AF 1.2 CAL) 1,000 mL (04/08/22 1146)   magnesium sulfate bolus IVPB     TPN ADULT (ION) 45 mL/hr at 04/09/22 0600   PRN Meds: sodium chloride, acetaminophen (TYLENOL) oral liquid 160 mg/5 mL, bisacodyl, guaiFENesin-dextromethorphan, hydrALAZINE, labetalol, lip balm, magnesium sulfate bolus IVPB, ondansetron, oxyCODONE, phenol, silver nitrate applicators, sodium chloride flush, traMADol   Vital Signs    Vitals:   04/09/22 0300 04/09/22 0400 04/09/22 0500 04/09/22 0600  BP: 104/60 (!) 102/59 104/84 (!) 141/70  Pulse: 92 93 95 98  Resp: (!) 22 (!) '25 19 15  '$ Temp:  97.8 F (36.6 C)    TempSrc:  Axillary    SpO2: 95% 97% 99% 98%  Weight:    60.6 kg  Height:        Intake/Output Summary (Last 24 hours) at  04/09/2022 0659 Last data filed at 04/09/2022 0600 Gross per 24 hour  Intake 2474.73 ml  Output 1050 ml  Net 1424.73 ml      04/09/2022    6:00 AM 04/08/2022    6:00 AM 04/07/2022    4:01 AM  Last 3 Weights  Weight (lbs) 133 lb 9.6 oz 134 lb 14.7 oz 128 lb 8.5 oz  Weight (kg) 60.6 kg 61.2 kg 58.3 kg      Telemetry    NSR - Personally Reviewed  ECG    No new tracing - Personally Reviewed  Physical Exam   GEN: No acute distress. Sitting in bed Neck: No JVD Cardiac: RRR, no murmurs, rubs, or gallops.  Respiratory: Diminished at bases GI: Soft, nontender, non-distended  MS: Warm Neuro:  Nonfocal  Psych: Withdrawn  Labs    High Sensitivity Troponin:   Recent Labs  Lab 03/23/22 1138 03/24/22 0843 03/25/22 1037 03/30/22 0057 03/30/22 0307  TROPONINIHS 3,592* 5,007* 2,820* 347* 316*     Chemistry Recent Labs  Lab 04/06/22 0530 04/07/22 0400 04/08/22 0726 04/09/22 0409  NA 145 145 142 135  K 3.7 4.0 4.4 4.2  CL 116* 119* 114* 110  CO2 24 22 19* 20*  GLUCOSE  243* 128* 200* 185*  BUN 35* 38* 38* 47*  CREATININE 0.73 0.73 0.73 0.84  CALCIUM 8.6* 8.3* 8.7* 8.0*  MG 2.3 2.2 2.3  --   PROT  --  6.7  --   --   ALBUMIN  --  2.4*  --   --   AST  --  41  --   --   ALT  --  42  --   --   ALKPHOS  --  108  --   --   BILITOT  --  0.6  --   --   GFRNONAA >60 >60 >60 >60  ANIONGAP 5 4* 9 5    Lipids  Recent Labs  Lab 04/07/22 0400  TRIG 157*    Hematology Recent Labs  Lab 04/07/22 0442 04/08/22 0726 04/09/22 0409  WBC 17.9* 18.1* 17.9*  RBC 3.27* 3.62* 3.03*  HGB 9.4* 10.5* 8.7*  HCT 30.6* 33.6* 28.3*  MCV 93.6 92.8 93.4  MCH 28.7 29.0 28.7  MCHC 30.7 31.3 30.7  RDW 14.9 15.0 15.3  PLT 435* 446* 399   Thyroid No results for input(s): TSH, FREET4 in the last 168 hours.  BNPNo results for input(s): BNP, PROBNP in the last 168 hours.  DDimer No results for input(s): DDIMER in the last 168 hours.   Radiology    CT HEAD WO CONTRAST (5MM)  Result  Date: 04/08/2022 CLINICAL DATA:  Mental status change with unknown cause EXAM: CT HEAD WITHOUT CONTRAST TECHNIQUE: Contiguous axial images were obtained from the base of the skull through the vertex without intravenous contrast. RADIATION DOSE REDUCTION: This exam was performed according to the departmental dose-optimization program which includes automated exposure control, adjustment of the mA and/or kV according to patient size and/or use of iterative reconstruction technique. COMPARISON:  Head CT 03/25/2022 FINDINGS: Brain: No evidence of acute infarction, hemorrhage, hydrocephalus, extra-axial collection or mass lesion/mass effect. Generalized atrophy. Chronic periventricular chronic small vessel ischemia. Vascular: No hyperdense vessel or unexpected calcification. Skull: Normal. Negative for fracture or focal lesion. Sinuses/Orbits: No acute finding. Other: Subcutaneous low-density in the right cheek, usually dermal inclusion cyst. IMPRESSION: 1. No acute finding. 2. Generalized atrophy with chronic microvascular ischemia. Electronically Signed   By: Jorje Guild M.D.   On: 04/08/2022 10:55    Cardiac Studies   TTE 03/23/22: IMPRESSIONS     1. Left ventricular ejection fraction, by estimation, is 20 to 25%. The  left ventricle has severely decreased function. The left ventricle  demonstrates global hypokinesis. Left ventricular diastolic parameters are  consistent with Grade II diastolic  dysfunction (pseudonormalization). Elevated left atrial pressure.   2. Right ventricular systolic function was not well visualized. The right  ventricular size is normal.   3. Mild mitral valve regurgitation.   4. The aortic valve is tricuspid. Aortic valve regurgitation is not  visualized. Aortic valve sclerosis is present, with no evidence of aortic  valve stenosis.   5. The inferior vena cava is dilated in size with <50% respiratory  variability, suggesting right atrial pressure of 15 mmHg.    Comparison(s): Compared to prior TTE on 03/22/22, the LVEF has dropped  significantly to 20% from 55-60%.   04/03/22: IMPRESSIONS     1. Left ventricular ejection fraction, by estimation, is 55 to 60%. The  left ventricle has normal function. The left ventricle has no regional  wall motion abnormalities. Left ventricular diastolic parameters are  consistent with Grade I diastolic  dysfunction (impaired relaxation).   2. Right  ventricular systolic function is normal. The right ventricular  size is normal.   3. The mitral valve is normal in structure. No evidence of mitral valve  regurgitation. No evidence of mitral stenosis.   4. The aortic valve is normal in structure. Aortic valve regurgitation is  not visualized. Aortic valve sclerosis is present, with no evidence of  aortic valve stenosis.   5. The inferior vena cava is normal in size with greater than 50%  respiratory variability, suggesting right atrial pressure of 3 mmHg.   Comparison(s): Prior images reviewed side by side. The left ventricular  function has improved. The left ventricular diastolic function has  improved.   Patient Profile     66 y.o. female DMII, HLD, HTN and critical limb ischemia who  presented for aortobifemoral bypass for critical limb ischemia with post-op course complicated by cardiogenic shock found to have LVEF 20% with TTE consistent with takotsubo cardiomyopathy. Now improving with repeat TTE with LVEF 55-60%.   Assessment & Plan    #Takotsubo Cardiomyopathy: #Cardiogenic Shock>Resolved: Patient's post-op course complicated by cardiogenic shock in the setting of stress cardiomyopathy. Initial TTE on 5/9 with LVEF 55-60% that dropped to 20-25% on 5/10 which has subsequently improved to 55-60% on 5/21. Currently significantly improved. No plans for ischemic evaluation given rapid recovery in EF. Will continue medical management.  -Continue entresto 49/'51mg'$  BID -Continue coreg '25mg'$  BID -Continue  spironolactone '25mg'$  daily -Will arrange follow-up as outpatient  #PAD s/p aortobifemoral bypass: #Critical Limb Ischemia: -Vascular following -Continue ASA and statin  #DMII with Hyperglycemia: -Management per critical care  #Sacral Wound: -Wound care  Cardiology will sign-off. Will arrange for outpatient follow-up.      For questions or updates, please contact [place] Please consult [email or web address] for contact info under        Signed, Freada Bergeron, MD  04/09/2022, 6:59 AM

## 2022-04-08 NOTE — Progress Notes (Addendum)
PHARMACY - TOTAL PARENTERAL NUTRITION CONSULT NOTE   Indication: Prolonged ileus  Patient Measurements: Height: '4\' 9"'$  (144.8 cm) Weight: 61.2 kg (134 lb 14.7 oz) IBW/kg (Calculated) : 38.6 TPN AdjBW (KG): 40.6 Body mass index is 29.2 kg/m. Usual Weight: ~118 lbs per chart review   Assessment:  66 yo W admitted with BL LE critical limb ischemia s/p aortofemoral bypass, BL femoral endarterectomy and Left-sided profunda endarterectomy with bovine pericardial patch 5/8. Patient with cardiogenic shock post-procedure now fluid overloaded. Patient with return of bowel function and multiple large stools 5/17, however, continues to have emesis and high NGT output. Imagining consistent with prolonged ileus. Patient received some trickle TF 5/11-5/13 and 5/16. While on TF (received up to 76g dextrose/24hr), patient required 41- 60 units of insulin. Patient is at risk for refeeding. Pharmacy consulted for TPN.   Of note, triglycerides are elevated at baseline (have been as high as 499 this admission while on propofol) but has been stable since TPN start. Per team, pt appears dry with Na elevated and CVP low. Ok to liberalize TPN fluids.   Glucose / Insulin: A1C 9.2. CBGs mostly wnl at 135 - 184 with no recent CBG 200. Used 36 units of insulin yesterday with 14 units from scheduled q4h insulin (reduced from 5 units q4h to 3 units q4h yesterday) + levemir 30 BID + 132 units insulin in TPN (Max allowed in TPN)  Electrolytes: Na 142 (on FW 100 q6h), K 4.4, Cl 114, CO2 19, CoCa 9.9. Phos 3.0 Other electrolytes wnl. Renal: Scr 0.73, BUN 38 Hepatic: LFTs/Tbili wnl, albumin 2.5, TG 157 Intake / Output; MIVF: UOP 0.7 ml/kg/hr, NGT removed 5/22, Cortrak in place, 127m stool - decrease GI Imaging: 5/8 KUB: unremarkable 5/13 KUB: increased small bowel gas, ileus vs obstruction 5/14 KUB: ileus vs SBO  5/15 KUB: Similar gaseous distension of small bowel likely resolving ileus  5/17 KUB: Similar small bowel and  proximal colonic distension consistent with postoperative ileus  5/23 KUB: persistent mild distended small bowl loops; dec distention 5/24 KUB: less intestinal gas, no dilated small bowel loops 5/25 KUB: dec in small bowel gas GI Surgeries / Procedures:  5/17 NGT placement   Central access: PICC 5/17  TPN start date: 5/18  Nutritional Goals: Goal TPN rate (non-concentrated) is 85 mL/hr, 62 g/L of 10% protein, 13% dextrose, 29 g/L lipids (provides 126 g of protein, 266 g dextrose, 59 g lipids and 2000 kcals per day)  RD Assessment: Estimated Needs Total Energy Estimated Needs: 1900-2100 kcals Total Protein Estimated Needs: 110-130 g Total Fluid Estimated Needs: >/= 1.9 L  Current Nutrition:  TPN and Vital AF 1.2 - increasing to 35 ml/hr today  Plan:  Decrease TPN to 1/2 rate with plan to increase TF to 35 ml/hr today per Dr. STamala Julian TPN at 45 ml/hr to provide 67g AA and 1059 kcal (with TF at 35 ml/hr will meet 100% of patients goals with 125g AA and 1995 kcal) Electrolytes in TPN: Na 0 mEq/L, K 45 mEq/L, Ca 0 mEq/L, Mg 6 mEq/L, phos 12 mmol/L. Max acetate - no changes today.  Add standard MVI and trace elements to TPN Decrease insulin in TPN to 69 units (at max of 65 units/L) with rate decrease. Continue levemir 30 units BID and 3 units q4h per CCM with hold parameters for CBGs <120. With TF and TPN rate reduction will receive ~232g dextrose compared to 318g in goal TPN and TF at 252mhr so will monitor closely to avoid hyper/hypoglycemia  Continue resistant q4h SSI  TPN las Mon/Thurs, repeat electrolytes tomorrow Follow-up tube feed toleration of TF and ability to stop TPN - current plan per Dr. Tamala Julian to increase TF to 35 ml/hr today, increase to 50 ml/hr tomorrow, and goal rate of 70 ml/hr on Sunday - updated RD  Cristela Felt, PharmD, BCPS Clinical Pharmacist 04/08/2022 7:08 AM

## 2022-04-08 NOTE — Progress Notes (Signed)
Progress Note  Patient Name: Gabrielle [name] Date of Encounter: 04/08/2022  [facility] Cardiologist: Janina Mayo, MD   Subjective   Appears comfortable in bed.  Inpatient Medications    Scheduled Meds:  aspirin  81 mg Per Tube Daily   bacitracin   Topical TID   carvedilol  25 mg Per Tube BID WC   Chlorhexidine Gluconate Cloth  6 each Topical Daily   docusate  100 mg Per Tube BID   free water  100 mL Per Tube Q6H   heparin injection (subcutaneous)  5,000 Units Subcutaneous Q8H   insulin aspart  0-20 Units Subcutaneous Q4H   insulin aspart  3 Units Subcutaneous Q4H   insulin detemir  30 Units Subcutaneous BID   leptospermum manuka honey  1 application. Topical Daily   mouth rinse  15 mL Mouth Rinse BID   pantoprazole (PROTONIX) IV  40 mg Intravenous QHS   polyethylene glycol  17 g Per Tube Daily   QUEtiapine  25 mg Per Tube QHS   rosuvastatin  40 mg Per Tube Daily   sacubitril-valsartan  1 tablet Per Tube BID   sodium chloride flush  10-40 mL Intracatheter Q12H   spironolactone  25 mg Per Tube Daily   Continuous Infusions:  sodium chloride 10 mL/hr at 04/08/22 0800   feeding supplement (VITAL AF 1.2 CAL) 20 mL/hr at 04/08/22 0600   magnesium sulfate bolus IVPB     TPN ADULT (ION) 85 mL/hr at 04/08/22 0800   PRN Meds: sodium chloride, acetaminophen (TYLENOL) oral liquid 160 mg/5 mL, bisacodyl, guaiFENesin-dextromethorphan, hydrALAZINE, labetalol, lip balm, magnesium sulfate bolus IVPB, ondansetron, oxyCODONE, phenol, silver nitrate applicators, sodium chloride flush, traMADol   Vital Signs    Vitals:   04/08/22 0500 04/08/22 0600 04/08/22 0800 04/08/22 0803  BP: (!) 84/66 (!) 116/53 (!) 141/70   Pulse: (!) 103 (!) 102 (!) 106   Resp: (!) 27 (!) 31 (!) 28   Temp:    98.3 F (36.8 C)  TempSrc:    Axillary  SpO2: 97% 98% 99%   Weight:  61.2 kg    Height:        Intake/Output Summary (Last 24 hours) at 04/08/2022 0842 Last data filed at 04/08/2022  0800 Gross per 24 hour  Intake 3009.32 ml  Output 1100 ml  Net 1909.32 ml      04/08/2022    6:00 AM 04/07/2022    4:01 AM 04/06/2022    4:00 AM  Last 3 Weights  Weight (lbs) 134 lb 14.7 oz 128 lb 8.5 oz 130 lb 15.3 oz  Weight (kg) 61.2 kg 58.3 kg 59.4 kg      Telemetry    Sinus rhythm, no adverse arrhythmias- Personally Reviewed Physical Exam   Normal respiratory effort  Labs    High Sensitivity Troponin:   Recent Labs  Lab 03/23/22 1138 03/24/22 0843 03/25/22 1037 03/30/22 0057 03/30/22 0307  TROPONINIHS 3,592* 5,007* 2,820* 347* 316*     Chemistry Recent Labs  Lab 04/06/22 0530 04/07/22 0400 04/08/22 0726  NA 145 145 142  K 3.7 4.0 4.4  CL 116* 119* 114*  CO2 24 22 19*  GLUCOSE 243* 128* 200*  BUN 35* 38* 38*  CREATININE 0.73 0.73 0.73  CALCIUM 8.6* 8.3* 8.7*  MG 2.3 2.2 2.3  PROT  --  6.7  --   ALBUMIN  --  2.4*  --   AST  --  41  --   ALT  --  42  --   ALKPHOS  --  108  --   BILITOT  --  0.6  --   GFRNONAA >60 >60 >60  ANIONGAP 5 4* 9    Lipids  Recent Labs  Lab 04/07/22 0400  TRIG 157*    Hematology Recent Labs  Lab 04/02/22 0217 04/06/22 0428 04/07/22 0442  WBC 16.6* 17.0* 17.9*  RBC 3.03* 3.11* 3.27*  HGB 8.8* 9.4* 9.4*  HCT 29.1* 31.3* 30.6*  MCV 96.0 100.6* 93.6  MCH 29.0 30.2 28.7  MCHC 30.2 30.0 30.7  RDW 14.4 15.5 14.9  PLT 354 220 435*   Thyroid No results for input(s): TSH, FREET4 in the last 168 hours.  BNPNo results for input(s): BNP, PROBNP in the last 168 hours.  DDimer No results for input(s): DDIMER in the last 168 hours.   Radiology    DG Abd 1 View  Result Date: 04/06/2022 CLINICAL DATA:  Ileus EXAM: ABDOMEN - 1 VIEW COMPARISON:  04/05/2022 FINDINGS: Soft feeding tube remains in place within the 3rd to 4th portion of the duodenum. There is less visible intestinal gas. No worsening or new finding. IMPRESSION: Less intestinal gas than seen yesterday. No dilated small bowel loops. Soft feeding tube tip remains  in the junction of the third and fourth portion of the duodenum. Electronically Signed   By: Nelson Chimes M.D.   On: 04/06/2022 08:57   DG Abd Portable 1V  Result Date: 04/07/2022 CLINICAL DATA:  Abdominal pain EXAM: PORTABLE ABDOMEN - 1 VIEW COMPARISON:  04/06/2022 FINDINGS: Tip of enteric tube is seen in the stomach. Bowel gas pattern is nonspecific. There is interval decrease in amount of small bowel gas. IMPRESSION: Tip of feeding tube is seen in the stomach. Nonspecific bowel gas pattern. Electronically Signed   By: Elmer Picker M.D.   On: 04/07/2022 08:21   DG Abd Portable 1V  Result Date: 04/06/2022 CLINICAL DATA:  NG tube placement EXAM: PORTABLE ABDOMEN - 1 VIEW COMPARISON:  None Available. FINDINGS: Feeding tube with tip in the gastric body. Tip directed towards the antral region. Stylet removed. IMPRESSION: Feeding tube within the gastric body Electronically Signed   By: Suzy Bouchard M.D.   On: 04/06/2022 16:27    Cardiac Studies   EF now 55 to 60%.  Was 20%  Patient Profile     66 y.o. female with PAD presenting with critical limb ischemia, poorly controlled diabetes mellitus, hyperlipidemia, hypertension developing acute systolic heart failure and cardiogenic shock roughly 48 hours following aortobifemoral bypass surgery and bilateral femoral enterectomy, left-sided profunda endarterectomy, performed 03/21/2022.  Echocardiogram showed acute reduction in LVEF from 55 to 60% to 20-25% from 1 day to the next 05/09 - 05/10.  Limited echo on 04/03/2022 shows normal ejection fraction 55 to 60%  Assessment & Plan    Takotsubo cardiomyopathy Type II myocardial infarction - Continue with supportive care.  Started Entresto 49/51 medium dose 5/23.  Carvedilol also added/increased to 12.5 twice a day previously.  Tolerating well from a blood pressure perspective.  Continue.  No further adjustments to medications at this time. -Also on spironolactone 25 mg.  No changes made.   Creatinine 0.73 potassium 4.4 excellent. -Since there is improvement in EF, no need for coronary angiogram.  I do not feel strongly that an ischemic evaluation is needed given the resolution of EF.  This seems compatible with Takotsubo cardiomyopathy.  PAD status post aortobifemoral bypass - Vascular surgery note reviewed.  Tube feeds are increasing.  He  had long discussion about importance of aggressive rehab length.  Sacral decubitus - Observed wound previously with wound care team.  See picture in prior note.    For questions or updates, please contact [place] Please consult [email or web address] for contact info under        Signed, Candee Furbish, MD  04/08/2022, 8:42 AM

## 2022-04-09 DIAGNOSIS — I5021 Acute systolic (congestive) heart failure: Secondary | ICD-10-CM | POA: Diagnosis not present

## 2022-04-09 DIAGNOSIS — I739 Peripheral vascular disease, unspecified: Secondary | ICD-10-CM | POA: Diagnosis not present

## 2022-04-09 DIAGNOSIS — I7409 Other arterial embolism and thrombosis of abdominal aorta: Secondary | ICD-10-CM | POA: Diagnosis not present

## 2022-04-09 LAB — BASIC METABOLIC PANEL
Anion gap: 5 (ref 5–15)
BUN: 47 mg/dL — ABNORMAL HIGH (ref 8–23)
CO2: 20 mmol/L — ABNORMAL LOW (ref 22–32)
Calcium: 8 mg/dL — ABNORMAL LOW (ref 8.9–10.3)
Chloride: 110 mmol/L (ref 98–111)
Creatinine, Ser: 0.84 mg/dL (ref 0.44–1.00)
GFR, Estimated: >60 mL/min (ref >60)
Glucose, Bld: 185 mg/dL — ABNORMAL HIGH (ref 70–99)
Potassium: 4.2 mmol/L (ref 3.5–5.1)
Sodium: 135 mmol/L (ref 135–145)

## 2022-04-09 LAB — GLUCOSE, CAPILLARY
Glucose-Capillary: 122 mg/dL — ABNORMAL HIGH (ref 70–99)
Glucose-Capillary: 127 mg/dL — ABNORMAL HIGH (ref 70–99)
Glucose-Capillary: 152 mg/dL — ABNORMAL HIGH (ref 70–99)
Glucose-Capillary: 154 mg/dL — ABNORMAL HIGH (ref 70–99)
Glucose-Capillary: 172 mg/dL — ABNORMAL HIGH (ref 70–99)
Glucose-Capillary: 206 mg/dL — ABNORMAL HIGH (ref 70–99)

## 2022-04-09 LAB — CBC
HCT: 28.3 % — ABNORMAL LOW (ref 36.0–46.0)
Hemoglobin: 8.7 g/dL — ABNORMAL LOW (ref 12.0–15.0)
MCH: 28.7 pg (ref 26.0–34.0)
MCHC: 30.7 g/dL (ref 30.0–36.0)
MCV: 93.4 fL (ref 80.0–100.0)
Platelets: 399 10*3/uL (ref 150–400)
RBC: 3.03 MIL/uL — ABNORMAL LOW (ref 3.87–5.11)
RDW: 15.3 % (ref 11.5–15.5)
WBC: 17.9 10*3/uL — ABNORMAL HIGH (ref 4.0–10.5)
nRBC: 0 % (ref 0.0–0.2)

## 2022-04-09 LAB — PROCALCITONIN: Procalcitonin: 0.3 ng/mL

## 2022-04-09 LAB — APTT: aPTT: 34 seconds (ref 24–36)

## 2022-04-09 MED ORDER — INSULIN ASPART 100 UNIT/ML IJ SOLN
5.0000 [IU] | INTRAMUSCULAR | Status: DC
  Administered 2022-04-09 – 2022-04-11 (×7): 5 [IU] via SUBCUTANEOUS

## 2022-04-09 MED ORDER — PANTOPRAZOLE 2 MG/ML SUSPENSION
40.0000 mg | Freq: Every day | ORAL | Status: DC
  Administered 2022-04-09 – 2022-05-01 (×23): 40 mg
  Filled 2022-04-09 (×23): qty 20

## 2022-04-09 MED ORDER — VITAL AF 1.2 CAL PO LIQD
1000.0000 mL | ORAL | Status: DC
  Administered 2022-04-09 – 2022-04-12 (×3): 1000 mL

## 2022-04-09 NOTE — Progress Notes (Signed)
  Progress Note    04/09/2022 10:17 AM 19 Days Post-Op  Subjective: No acute changes, persistent diarrhea for nursing  Vitals:   04/09/22 0700 04/09/22 0753  BP: 120/67   Pulse: 99   Resp: (!) 22   Temp:  98.1 F (36.7 C)  SpO2: 97%     Physical Exam: Alert tracks with her eyes but not interactive Abdomen mildly distended Feet are well-perfused bilaterally  CBC    Component Value Date/Time   WBC 17.9 (H) 04/09/2022 0409   RBC 3.03 (L) 04/09/2022 0409   HGB 8.7 (L) 04/09/2022 0409   HGB 14.5 03/22/2021 0916   HCT 28.3 (L) 04/09/2022 0409   HCT 43.9 03/22/2021 0916   PLT 399 04/09/2022 0409   PLT 261 03/22/2021 0916   MCV 93.4 04/09/2022 0409   MCV 90 03/22/2021 0916   MCH 28.7 04/09/2022 0409   MCHC 30.7 04/09/2022 0409   RDW 15.3 04/09/2022 0409   RDW 12.5 03/22/2021 0916   LYMPHSABS 2.8 02/03/2015 0527   MONOABS 0.4 02/03/2015 0527   EOSABS 0.1 02/03/2015 0527   BASOSABS 0.0 02/03/2015 0527    BMET    Component Value Date/Time   NA 135 04/09/2022 0409   NA 137 03/22/2021 0916   K 4.2 04/09/2022 0409   CL 110 04/09/2022 0409   CO2 20 (L) 04/09/2022 0409   GLUCOSE 185 (H) 04/09/2022 0409   BUN 47 (H) 04/09/2022 0409   BUN 11 03/22/2021 0916   CREATININE 0.84 04/09/2022 0409   CREATININE 0.74 11/11/2015 1921   CALCIUM 8.0 (L) 04/09/2022 0409   GFRNONAA >60 04/09/2022 0409   GFRNONAA >89 11/11/2015 1921   GFRAA 78 10/23/2020 1701   GFRAA >89 11/11/2015 1921    INR    Component Value Date/Time   INR 1.1 03/27/2022 0510     Intake/Output Summary (Last 24 hours) at 04/09/2022 1017 Last data filed at 04/09/2022 0700 Gross per 24 hour  Intake 2150 ml  Output 600 ml  Net 1550 ml     Assessment:  66 y.o. female is s/p aortobifemoral bypass Plan: Advancing tube feeds, out of bed as tolerates.  Appreciate cardiology and critical care medicine treatment of this patient.   Gabrielle [name] C. Donzetta Matters, MD [facility]  [place] Office: [phone] Pager: [phone]  04/09/2022 10:17 AM

## 2022-04-09 NOTE — Progress Notes (Signed)
NAME:  Gabrielle [name], MRN:  [id number], DOB:  02/24/1956, LOS: 52 ADMISSION DATE:  03/21/2022, CONSULTATION DATE:  03/22/22 REFERRING MD:  Unk Lightning, CHIEF COMPLAINT:  abd pain   History of Present Illness:  66 year old woman with hx of rectal cancer, DM2, HTN, HLD who presented for open aortobifemoral bypass by Dr. Unk Lightning for critical limb ischemia.  Postop course complicated by shock in the setting of stress cardiomyopathy.  Patient underwent procedure with 14-7 bifurcated dacron graft, bilateral femoral endarterectomy late 5/8pm.  EBL 1.5L.  Currently c/o SOB and abdominal discomfort. Developed respiratory distress 5/10, intubated.  ICU course complicated by ileus requiring TPN with malnutrition.  She was successfully extubated 5/20.    Pertinent  Medical History  DM2- A1c 9.3% HTN HLD Severe PVD  Significant Hospital Events: Including procedures, antibiotic start and stop dates in addition to other pertinent events   5/8 Admit, Aortobifemoral bypass using 14 x 7 bifurcated Dacron graft, Bilateral femoral endarterectomy, Left-sided profunda endarterectomy with bovine pericardial patch 5/9 PCCM consult for hypotension 5/10 worsening respiratory distress, intubated 5/11 diuresis 5/16 abdominal distention, increase bowel reg, attempts to wean from ventilator limited by low volumes 5/17 tachycardic, c/f STEMI, per cardiology no STEMI, PICC line placed for TPN 5/18 return of bowel function 5/20 extubated 5/22 Waxing/waning alertness, small bore feeding tube. Not answering questions.  5/25 No major issues overnight, mentation continues to wax and wane  5/26 Head CT pending    Interim History / Subjective:  No events. Denies pain. Talking a little more today.  Objective   Blood pressure 120/67, pulse 99, temperature 98.1 F (36.7 C), temperature source Oral, resp. rate (!) 22, height '4\' 9"'$  (1.448 m), weight 60.6 kg, SpO2 97 %.        Intake/Output Summary (Last 24 hours) at  04/09/2022 0835 Last data filed at 04/09/2022 0700 Gross per 24 hour  Intake 2294.71 ml  Output 600 ml  Net 1694.71 ml    Filed Weights   04/07/22 0401 04/08/22 0600 04/09/22 0600  Weight: 58.3 kg 61.2 kg 60.6 kg    Examination: No distress Abdomen soft, +BS NGT and rectal tube in place Ext warm Following commands Lungs clear Keeping weight off her sacrum which seems to be bothering her  Resolved Hospital Problem list   Postoperative shock Postoperative ileus Acute hypoxemic respiratory failure AKI Acute metabolic encephalopathy/icu delirium Hypernatremia   Assessment & Plan:   Stress Cardiomyopathy with Cardiogenic Shock - resolved Acute Systolic Heart Failure  -appreciate Cardiology assistance with patient care > rec's for no LHC at this time given marked improvement in EF  P: Continuous telemetry  Continue ASA, statin, coreg, and spironolactone Strict intake and output    DM2 with Hyperglycemia- improved Ileus- improved P: Turn up TF to goal today Stop TPN  PAD s/p Aortobifemoral bypass  HTN, HLD hx  P: Primary management per Vascular  Continue ASA and statin  PT/OT  Anemia of critical illness P: Trend CBC   ICU Delirium- hypoactive component P: Encourage day/night cycles Seems to be slowly improving  Sacral Tissue Injury  P: WOC/PT following  Pressure alleviating devices/measures Optimize nutrition   LLE paresis- has been there since surgery. CT head benign P: Some slow improvement question nerve entrapment?  Monitor clinically for now and consider L spine MRI down the line  Best Practice (right click and "Reselect all SmartList Selections" daily)  Diet/type: TF DVT prophylaxis: heparin ppx GI prophylaxis: N/A Lines: PICC- TPN Foley:  N/A Code  Status:  full code Last date of multidisciplinary goals of care discussion: per primary.  Erskine Emery MD PCCM [facility] Personal contact information can be found on Amion   04/09/2022, 8:35 AM

## 2022-04-09 NOTE — Progress Notes (Signed)
PHARMACY - TOTAL PARENTERAL NUTRITION CONSULT NOTE   Indication: Prolonged ileus  Patient Measurements: Height: '4\' 9"'$  (144.8 cm) Weight: 60.6 kg (133 lb 9.6 oz) IBW/kg (Calculated) : 38.6 TPN AdjBW (KG): 40.6 Body mass index is 28.91 kg/m. Usual Weight: ~118 lbs per chart review   Assessment:  66 yo W admitted with BL LE critical limb ischemia s/p aortofemoral bypass, BL femoral endarterectomy and Left-sided profunda endarterectomy with bovine pericardial patch 5/8. Patient with cardiogenic shock post-procedure now fluid overloaded. Patient with return of bowel function and multiple large stools 5/17, however, continues to have emesis and high NGT output. Imagining consistent with prolonged ileus. Patient received some trickle TF 5/11-5/13 and 5/16. While on TF (received up to 76g dextrose/24hr), patient required 41- 60 units of insulin. Patient is at risk for refeeding. Pharmacy consulted for TPN.   Of note, triglycerides are elevated at baseline (have been as high as 499 this admission while on propofol) but has been stable since TPN start. Per team, pt appears dry with Na elevated and CVP low. Ok to liberalize TPN fluids.   Glucose / Insulin: A1C 9.2 (on metformin, semaglutide, sitagliptin, and dapagliflozin PTA). CBGs 152-214. Used 23 units of SSI yesterday plus 18 units from scheduled q4h insulin + levemir 30 BID + 69 units insulin in TPN Electrolytes: Na 135 (on FW 100 q6h), CO2 20, others WNL Renal: Scr stable WNL, BUN up to 47 Hepatic: LFTs/Tbili wnl, albumin 2.4, TG 157 (down) Intake / Output; MIVF: UOP 0.4 ml/kg/hr per documentation, 238m stool GI Imaging: 5/8 KUB: unremarkable 5/13 KUB: increased small bowel gas, ileus vs obstruction 5/14 KUB: ileus vs SBO  5/15 KUB: Similar gaseous distension of small bowel likely resolving ileus  5/17 KUB: Similar small bowel and proximal colonic distension consistent with postoperative ileus  5/23 KUB: persistent mild distended small bowl  loops; dec distention 5/24 KUB: less intestinal gas, no dilated small bowel loops 5/25 KUB: dec in small bowel gas GI Surgeries / Procedures:  5/17 NGT placement   Central access: PICC 5/17  TPN start date: 5/18  Nutritional Goals: Goal TPN rate (non-concentrated) is 85 mL/hr, 62 g/L of 10% protein, 13% dextrose, 29 g/L lipids (provides 126 g of protein, 266 g dextrose, 59 g lipids and 2000 kcals per day)  RD Assessment: Estimated Needs Total Energy Estimated Needs: 1900-2100 kcals Total Protein Estimated Needs: 110-130 g Total Fluid Estimated Needs: >/= 1.9 L  Current Nutrition:  1/2 TPN Vital AF 1.2 - increasing to goal 70 ml/hr today  Plan:  D/c TPN today per CCM (Tamala Julian with TF increasing to goal today At 1600, wean TPN to 20 ml/hr x 2 hrs, then stop TPN - communicated plan with RN Continue levemir 30 units BID + resistant q4h SSI + 3 units q4h per CCM with hold parameters for CBGs <120. Monitor CBGs closely off TPN and adjust as needed D/c TPN labs and nursing orders   HArturo Morton PharmD, BCPS Please check AMION for all [facility]contact numbers Clinical Pharmacist 04/09/2022 10:04 AM

## 2022-04-10 DIAGNOSIS — I7409 Other arterial embolism and thrombosis of abdominal aorta: Secondary | ICD-10-CM | POA: Diagnosis not present

## 2022-04-10 DIAGNOSIS — I5021 Acute systolic (congestive) heart failure: Secondary | ICD-10-CM | POA: Diagnosis not present

## 2022-04-10 DIAGNOSIS — I739 Peripheral vascular disease, unspecified: Secondary | ICD-10-CM | POA: Diagnosis not present

## 2022-04-10 LAB — BASIC METABOLIC PANEL
Anion gap: 5 (ref 5–15)
BUN: 44 mg/dL — ABNORMAL HIGH (ref 8–23)
CO2: 21 mmol/L — ABNORMAL LOW (ref 22–32)
Calcium: 8.4 mg/dL — ABNORMAL LOW (ref 8.9–10.3)
Chloride: 116 mmol/L — ABNORMAL HIGH (ref 98–111)
Creatinine, Ser: 0.86 mg/dL (ref 0.44–1.00)
Glucose, Bld: 119 mg/dL — ABNORMAL HIGH (ref 70–99)
Potassium: 4.7 mmol/L (ref 3.5–5.1)
Sodium: 142 mmol/L (ref 135–145)

## 2022-04-10 LAB — GLUCOSE, CAPILLARY
Glucose-Capillary: 113 mg/dL — ABNORMAL HIGH (ref 70–99)
Glucose-Capillary: 124 mg/dL — ABNORMAL HIGH (ref 70–99)
Glucose-Capillary: 128 mg/dL — ABNORMAL HIGH (ref 70–99)
Glucose-Capillary: 140 mg/dL — ABNORMAL HIGH (ref 70–99)
Glucose-Capillary: 143 mg/dL — ABNORMAL HIGH (ref 70–99)
Glucose-Capillary: 73 mg/dL (ref 70–99)
Glucose-Capillary: 73 mg/dL (ref 70–99)

## 2022-04-10 LAB — CBC
HCT: 27.9 % — ABNORMAL LOW (ref 36.0–46.0)
Hemoglobin: 8.7 g/dL — ABNORMAL LOW (ref 12.0–15.0)
MCH: 29 pg (ref 26.0–34.0)
MCHC: 31.2 g/dL (ref 30.0–36.0)
MCV: 93 fL (ref 80.0–100.0)
Platelets: 389 10*3/uL (ref 150–400)
RBC: 3 MIL/uL — ABNORMAL LOW (ref 3.87–5.11)
RDW: 15.6 % — ABNORMAL HIGH (ref 11.5–15.5)
WBC: 16.3 10*3/uL — ABNORMAL HIGH (ref 4.0–10.5)
nRBC: 0 % (ref 0.0–0.2)

## 2022-04-10 LAB — MAGNESIUM: Magnesium: 2.2 mg/dL (ref 1.7–2.4)

## 2022-04-10 LAB — APTT: aPTT: 31 seconds (ref 24–36)

## 2022-04-10 MED ORDER — INSULIN DETEMIR 100 UNIT/ML ~~LOC~~ SOLN
15.0000 [IU] | Freq: Two times a day (BID) | SUBCUTANEOUS | Status: DC
  Filled 2022-04-10 (×3): qty 0.15

## 2022-04-10 MED ORDER — METOCLOPRAMIDE HCL 5 MG/ML IJ SOLN
10.0000 mg | Freq: Three times a day (TID) | INTRAMUSCULAR | Status: AC
  Administered 2022-04-10 – 2022-04-12 (×6): 10 mg via INTRAVENOUS
  Filled 2022-04-10 (×6): qty 2

## 2022-04-10 NOTE — Progress Notes (Signed)
[place] Progress Note Patient Name: Gabrielle [name] DOB: 1956-03-13 MRN: [id number]   Date of Service  04/10/2022  HPI/Events of Note  Notified of low glucoses in the 23s.   Tube feeds were decreased due to abdominal distention.  Levemir also cut in half but glucoses running in the 70s.  eICU Interventions  Hold scheduled levemir for now. Continue close monitoring.     Intervention Category Minor Interventions: Other:  Elsie Lincoln 04/10/2022, 10:41 PM

## 2022-04-10 NOTE — Progress Notes (Signed)
  Progress Note    04/10/2022 12:19 PM 20 Days Post-Op  Subjective: More interactive today, no overnight issues  Vitals:   04/10/22 1100 04/10/22 1147  BP: (!) 99/50   Pulse: (!) 104   Resp: (!) 22   Temp:  99.5 F (37.5 C)  SpO2: 97%     Physical Exam: Awake alert oriented Nonlabored respirations Her feet are warm and well-perfused bilaterally.  CBC    Component Value Date/Time   WBC 16.3 (H) 04/10/2022 0306   RBC 3.00 (L) 04/10/2022 0306   HGB 8.7 (L) 04/10/2022 0306   HGB 14.5 03/22/2021 0916   HCT 27.9 (L) 04/10/2022 0306   HCT 43.9 03/22/2021 0916   PLT 389 04/10/2022 0306   PLT 261 03/22/2021 0916   MCV 93.0 04/10/2022 0306   MCV 90 03/22/2021 0916   MCH 29.0 04/10/2022 0306   MCHC 31.2 04/10/2022 0306   RDW 15.6 (H) 04/10/2022 0306   RDW 12.5 03/22/2021 0916   LYMPHSABS 2.8 02/03/2015 0527   MONOABS 0.4 02/03/2015 0527   EOSABS 0.1 02/03/2015 0527   BASOSABS 0.0 02/03/2015 0527    BMET    Component Value Date/Time   NA 142 04/10/2022 0306   NA 137 03/22/2021 0916   K 4.7 04/10/2022 0306   CL 116 (H) 04/10/2022 0306   CO2 21 (L) 04/10/2022 0306   GLUCOSE 119 (H) 04/10/2022 0306   BUN 44 (H) 04/10/2022 0306   BUN 11 03/22/2021 0916   CREATININE 0.86 04/10/2022 0306   CREATININE 0.74 11/11/2015 1921   CALCIUM 8.4 (L) 04/10/2022 0306   GFRNONAA NOT CALCULATED 04/10/2022 0306   GFRNONAA >89 11/11/2015 1921   GFRAA 78 10/23/2020 1701   GFRAA >89 11/11/2015 1921    INR    Component Value Date/Time   INR 1.1 03/27/2022 0510     Intake/Output Summary (Last 24 hours) at 04/10/2022 1219 Last data filed at 04/10/2022 1100 Gross per 24 hour  Intake 2373.63 ml  Output 1225 ml  Net 1148.63 ml     Assessment/plan:  66 y.o. female is post aortobifemoral bypass.  Tolerating goal tube feeds.  Out of bed as tolerates.  Critical care medicine much appreciated.   Bary Limbach C. Donzetta Matters, MD [facility] [place] Office:  [phone] Pager: [phone]  04/10/2022 12:19 PM

## 2022-04-10 NOTE — Progress Notes (Signed)
NAME:  Gabrielle [name], MRN:  [id number], DOB:  [birth year], LOS: 80 ADMISSION DATE:  03/21/2022, CONSULTATION DATE:  03/22/22 REFERRING MD:  Unk Lightning, CHIEF COMPLAINT:  abd pain   History of Present Illness:  66 year old woman with hx of rectal cancer, DM2, HTN, HLD who presented for open aortobifemoral bypass by Dr. Unk Lightning for critical limb ischemia.  Postop course complicated by shock in the setting of stress cardiomyopathy.  Patient underwent procedure with 14-7 bifurcated dacron graft, bilateral femoral endarterectomy late 5/8pm.  EBL 1.5L.  Currently c/o SOB and abdominal discomfort. Developed respiratory distress 5/10, intubated.  ICU course complicated by ileus requiring TPN with malnutrition.  She was successfully extubated 5/20.    Pertinent  Medical History  DM2- A1c 9.3% HTN HLD Severe PVD  Significant Hospital Events: Including procedures, antibiotic start and stop dates in addition to other pertinent events   5/8 Admit, Aortobifemoral bypass using 14 x 7 bifurcated Dacron graft, Bilateral femoral endarterectomy, Left-sided profunda endarterectomy with bovine pericardial patch 5/9 PCCM consult for hypotension 5/10 worsening respiratory distress, intubated 5/11 diuresis 5/16 abdominal distention, increase bowel reg, attempts to wean from ventilator limited by low volumes 5/17 tachycardic, c/f STEMI, per cardiology no STEMI, PICC line placed for TPN 5/18 return of bowel function 5/20 extubated 5/22 Waxing/waning alertness, small bore feeding tube. Not answering questions.  5/25 No major issues overnight, mentation continues to wax and wane  5/26 Head CT pending    Interim History / Subjective:  No events. Talking more. Lots of diarrhea. Husband at bedside.  Objective   Blood pressure 114/69, pulse (!) 106, temperature (!) 97.2 F (36.2 C), temperature source Axillary, resp. rate (!) 27, height '4\' 9"'$  (1.448 m), weight 59.5 kg, SpO2 98 %.        Intake/Output Summary  (Last 24 hours) at 04/10/2022 3662 Last data filed at 04/10/2022 0700 Gross per 24 hour  Intake 2443.43 ml  Output 1225 ml  Net 1218.43 ml    Filed Weights   04/08/22 0600 04/09/22 0600 04/10/22 0330  Weight: 61.2 kg 60.6 kg 59.5 kg    Examination: No distress Abdomen soft, +BS Ext warm Following commands Lungs clear Keeping weight off her sacrum which seems to be bothering her, laying on left side today instead of R side  Resolved Hospital Problem list   Postoperative shock Postoperative ileus Acute hypoxemic respiratory failure AKI Acute metabolic encephalopathy/icu delirium Hypernatremia   Assessment & Plan:   Stress Cardiomyopathy with Cardiogenic Shock - resolved Acute Systolic Heart Failure  -appreciate Cardiology assistance with patient care > rec's for no LHC at this time given marked improvement in EF  P: Continuous telemetry  Continue ASA, statin, coreg, and spironolactone Strict intake and output    DM2 with Hyperglycemia- improved Ileus- improved P: TF, TPN off  PAD s/p Aortobifemoral bypass  HTN, HLD hx  P: Primary management per Vascular  Continue ASA and statin  PT/OT  Anemia of critical illness P: Trend CBC   ICU Delirium- hypoactive component P: Encourage day/night cycles Seems to be slowly improving  Sacral Tissue Injury  P: WOC/PT following  Pressure alleviating devices/measures Optimize nutrition   LLE paresis- has been there since surgery. CT head benign P: Some slow improvement question nerve entrapment?  Monitor clinically for now   Continue PT/OT/SLP work, question if can do IPR?  Best Practice (right click and "Reselect all SmartList Selections" daily)  Diet/type: TF DVT prophylaxis: heparin ppx GI prophylaxis: N/A Lines: PICC, probably can  remove soon Foley:  N/A Code Status:  full code Last date of multidisciplinary goals of care discussion: per primary.  Erskine Emery MD PCCM [facility] Personal contact information can be found on Amion  04/10/2022, 8:21 AM

## 2022-04-11 DIAGNOSIS — I739 Peripheral vascular disease, unspecified: Secondary | ICD-10-CM | POA: Diagnosis not present

## 2022-04-11 LAB — BASIC METABOLIC PANEL
Anion gap: 7 (ref 5–15)
BUN: 40 mg/dL — ABNORMAL HIGH (ref 8–23)
CO2: 20 mmol/L — ABNORMAL LOW (ref 22–32)
Calcium: 8.6 mg/dL — ABNORMAL LOW (ref 8.9–10.3)
Chloride: 116 mmol/L — ABNORMAL HIGH (ref 98–111)
Creatinine, Ser: 0.87 mg/dL (ref 0.44–1.00)
GFR, Estimated: >60 mL/min (ref >60)
Glucose, Bld: 114 mg/dL — ABNORMAL HIGH (ref 70–99)
Potassium: 4.6 mmol/L (ref 3.5–5.1)
Sodium: 143 mmol/L (ref 135–145)

## 2022-04-11 LAB — GLUCOSE, CAPILLARY
Glucose-Capillary: 114 mg/dL — ABNORMAL HIGH (ref 70–99)
Glucose-Capillary: 151 mg/dL — ABNORMAL HIGH (ref 70–99)
Glucose-Capillary: 176 mg/dL — ABNORMAL HIGH (ref 70–99)
Glucose-Capillary: 169 mg/dL — ABNORMAL HIGH (ref 70–99)
Glucose-Capillary: 188 mg/dL — ABNORMAL HIGH (ref 70–99)
Glucose-Capillary: 215 mg/dL — ABNORMAL HIGH (ref 70–99)

## 2022-04-11 LAB — CBC
HCT: 30.1 % — ABNORMAL LOW (ref 36.0–46.0)
Hemoglobin: 9.1 g/dL — ABNORMAL LOW (ref 12.0–15.0)
MCH: 28.9 pg (ref 26.0–34.0)
MCHC: 30.2 g/dL (ref 30.0–36.0)
MCV: 95.6 fL (ref 80.0–100.0)
Platelets: 399 10*3/uL (ref 150–400)
RBC: 3.15 MIL/uL — ABNORMAL LOW (ref 3.87–5.11)
RDW: 15.9 % — ABNORMAL HIGH (ref 11.5–15.5)
WBC: 16.8 10*3/uL — ABNORMAL HIGH (ref 4.0–10.5)
nRBC: 0 % (ref 0.0–0.2)

## 2022-04-11 LAB — APTT: aPTT: 37 seconds — ABNORMAL HIGH (ref 24–36)

## 2022-04-11 LAB — MAGNESIUM: Magnesium: 2.2 mg/dL (ref 1.7–2.4)

## 2022-04-11 MED ORDER — INSULIN DETEMIR 100 UNIT/ML ~~LOC~~ SOLN
10.0000 [IU] | Freq: Two times a day (BID) | SUBCUTANEOUS | Status: DC
  Administered 2022-04-11 (×2): 10 [IU] via SUBCUTANEOUS
  Filled 2022-04-11 (×4): qty 0.1

## 2022-04-11 NOTE — Progress Notes (Addendum)
Progress Note    04/11/2022 7:44 AM 21 Days Post-Op  Subjective:  pt awake and following commands  Tm 99.5 now afebrile HR 90's-110's  81'K-481'E systolic 56% RA  Vitals:   04/11/22 0600 04/11/22 0700  BP: 127/70 (!) 104/91  Pulse: (!) 108 (!) 110  Resp: (!) 24 (!) 26  Temp:    SpO2: 96% 97%    Physical Exam: Cardiac:  regular/tachy Lungs:  non labored now on RA Incisions:  laparotomy incision and bilateral groin incisions are clean and dry and healing nicely Extremities:  brisk bilateral DP doppler signals; moving all extremities-she did not wiggle toes on the LLE but did move her left leg to straighten it.  Abdomen:  distended but soft  CBC    Component Value Date/Time   WBC 16.8 (H) 04/11/2022 0345   RBC 3.15 (L) 04/11/2022 0345   HGB 9.1 (L) 04/11/2022 0345   HGB 14.5 03/22/2021 0916   HCT 30.1 (L) 04/11/2022 0345   HCT 43.9 03/22/2021 0916   PLT 399 04/11/2022 0345   PLT 261 03/22/2021 0916   MCV 95.6 04/11/2022 0345   MCV 90 03/22/2021 0916   MCH 28.9 04/11/2022 0345   MCHC 30.2 04/11/2022 0345   RDW 15.9 (H) 04/11/2022 0345   RDW 12.5 03/22/2021 0916   LYMPHSABS 2.8 02/03/2015 0527   MONOABS 0.4 02/03/2015 0527   EOSABS 0.1 02/03/2015 0527   BASOSABS 0.0 02/03/2015 0527    BMET    Component Value Date/Time   NA 143 04/11/2022 0345   NA 137 03/22/2021 0916   K 4.6 04/11/2022 0345   CL 116 (H) 04/11/2022 0345   CO2 20 (L) 04/11/2022 0345   GLUCOSE 114 (H) 04/11/2022 0345   BUN 40 (H) 04/11/2022 0345   BUN 11 03/22/2021 0916   CREATININE 0.87 04/11/2022 0345   CREATININE 0.74 11/11/2015 1921   CALCIUM 8.6 (L) 04/11/2022 0345   GFRNONAA >60 04/11/2022 0345   GFRNONAA >89 11/11/2015 1921   GFRAA 78 10/23/2020 1701   GFRAA >89 11/11/2015 1921    INR    Component Value Date/Time   INR 1.1 03/27/2022 0510     Intake/Output Summary (Last 24 hours) at 04/11/2022 0744 Last data filed at 04/11/2022 0700 Gross per 24 hour  Intake 1510 ml   Output 1200 ml  Net 310 ml     Assessment/Plan:  66 y.o. female is s/p:   aortobifemoral bypass  21 Days Post-Op   -pt with brisk DP doppler signals -Takotsubo cardiomyopathy-on entresto and BB, diuretic.  Good renal function.  Management per cardiology.  -she is awake and following commands and answers questions either with one word or head nod.  She was not able to wiggle toes on the left but she did move the LLE to straighten her leg.   -abdomen is distended but soft.  Receiving TF.  These were decreased this am due to distention and glucose in the 70's.   -acute blood loss anemia is stable and pt tolerating -persistent leukocytosis - pt is afebrile;  all incisions look good and healing -DVT prophylaxis:  sq heparin -sacral wound undergoing hydrotherapy with PT.    Leontine Locket, PA-C [facility] [phone] 04/11/2022 7:44 AM  I have independently interviewed and examined patient and agree with PA assessment and plan above.  She is only minimally responsive on my exam but was just given pain medicine for hydrotherapy of a sacral decubitus ulcer.  Her abdomen is nondistended her white  count is stable from yesterday.  Hopefully can get out of bed when she is more alert later today.  Lubna Stegeman C. Donzetta Matters, MD [facility] [place] Office: [phone] Pager: [phone]

## 2022-04-11 NOTE — Progress Notes (Signed)
Speech Language Pathology Treatment: Cognitive-Linquistic  Patient Details Name: JANILA ARRAZOLA MRN: [id number] DOB: 1955-11-27 Today's Date: 04/11/2022 Time: 0865-7846 SLP Time Calculation (min) (ACUTE ONLY): 23 min  Assessment / Plan / Recommendation Clinical Impression  Pt's communication-cognition has improved from last week. Increased accuracy of one step commands, processing information faster and less distracted today. She inconsistently made requests known with cues to initiate speech and increase volume. Stated her husband's name and speaking in one word to short phrases with 70% intelligibility. She accurately stated reason place and for admission (surgery, not aware of complications). ST to continue for speech and cognition.    HPI HPI: Patient is a 66 y/o female who presents on 5/8 for open aortobifemoral bypass and Bilateral femoral endarterectomy complicated by MI, heart failure and shock, intubated 5/10-5/20. PMH includes rectal cancer, DM2, HTN.      SLP Plan  Continue with current plan of care      Recommendations for follow up therapy are one component of a multi-disciplinary discharge planning process, led by the attending physician.  Recommendations may be updated based on patient status, additional functional criteria and insurance authorization.    Recommendations                   Oral Care Recommendations: Oral care BID Follow Up Recommendations:  (TBD) Assistance recommended at discharge: Frequent or constant Supervision/Assistance SLP Visit Diagnosis: Cognitive communication deficit (N62.952) Plan: Continue with current plan of care           Houston Siren  04/11/2022, 12:41 PM

## 2022-04-11 NOTE — Progress Notes (Signed)
Physical Therapy Wound Treatment Patient Details  Name: Gabrielle [name] MRN: [id number] Date of Birth: 1955/12/06  Today's Date: 04/11/2022 Time: 9024-0973 Time Calculation (min): 56 min  Subjective  Subjective Assessment Subjective: Pt stating "my hands, they are jumping". Patient and Family Stated Goals: not stated, agreeable to session Date of Onset:  (unknown) Prior Treatments: dressing changes  Pain Score:  Pre-medicated, pt tolerated well and fell asleep during session  Wound Assessment  Pressure Injury 03/30/22 Sacrum Posterior;Right;Left Deep Tissue Pressure Injury - Purple or maroon localized area of discolored intact skin or blood-filled blister due to damage of underlying soft tissue from pressure and/or shear. large deep tissue inj (Active)  Wound Image   04/11/22 1330  Dressing Type Foam - Lift dressing to assess site every shift;Barrier Film (skin prep);Gauze (Comment);Other (Comment) 04/11/22 1330  Dressing Clean, Dry, Intact 04/11/22 1330  Dressing Change Frequency Daily 04/11/22 1330  State of Healing Eschar 04/11/22 1330  Site / Wound Assessment Brown;Yellow;Pink;Pale;Black 04/11/22 1330  % Wound base Red or Granulating 10% 04/11/22 1330  % Wound base Yellow/Fibrinous Exudate 20% 04/11/22 1330  % Wound base Black/Eschar 70% 04/11/22 1330  % Wound base Other/Granulation Tissue (Comment) 0% 04/11/22 1330  Peri-wound Assessment Erythema (non-blanchable);Pink;Purple 04/11/22 1330  Wound Length (cm) 10.25 cm 04/11/22 1330  Wound Width (cm) 11.25 cm 04/11/22 1330  Wound Depth (cm) 0.5 cm 04/11/22 1330  Wound Surface Area (cm^2) 115.31 cm^2 04/11/22 1330  Wound Volume (cm^3) 57.66 cm^3 04/11/22 1330  Tunneling (cm) 0 04/11/22 1330  Undermining (cm) 0 04/11/22 1330  Margins Unattached edges (unapproximated) 04/11/22 1330  Drainage Amount Moderate 04/11/22 1330  Drainage Description Serous 04/11/22 1330  Treatment Debridement (Selective);Hydrotherapy (Pulse  lavage);Other (Comment);Packing (Dry gauze) 04/11/22 1330   Hydrotherapy Pulsed lavage therapy - wound location: sacral Pulsed Lavage with Suction (psi): 12 psi Pulsed Lavage with Suction - Normal Saline Used: 1000 mL Pulsed Lavage Tip: Tip with splash shield Selective Debridement Selective Debridement - Location: sacral Selective Debridement - Tools Used: Forceps, Scalpel Selective Debridement - Tissue Removed: Black and yellow necrotic tissue with some necrotic adipose tissue    Wound Assessment and Plan  Wound Therapy - Assess/Plan/Recommendations Wound Therapy - Clinical Statement: The pt's wound is continuing to make gradual progress with hydrotherapy, displaying increased red/pink tissue and decreased necrotic tissue. The necrotic tissue appears to be fairly shallow as red tissue inferior to it was found fairly quickly with selective debridement. Pt was pre-medicated and much less restless today, falling asleep during the session, allowing for increased removable of nonviable tissue with selective debridement this date. Pt would benefit from further hydrotherapy to remove necrotic tissue and promote wound healing. Wound Therapy - Functional Problem List: functional debility after prolonged time in bed. Factors Delaying/Impairing Wound Healing: Immobility, Multiple medical problems, Vascular compromise, Diabetes Mellitus Hydrotherapy Plan: Dressing change, Debridement, Pulsatile lavage with suction, Patient/family education Wound Therapy - Frequency: 3X / week Wound Therapy - Current Recommendations: PT Wound Therapy - Follow Up Recommendations: dressing changes by RN, dressing changes by family/patient  Wound Therapy Goals- Improve the function of patient's integumentary system by progressing the wound(s) through the phases of wound healing (inflammation - proliferation - remodeling) by: Wound Therapy Goals - Improve the function of patient's integumentary system by progressing the  wound(s) through the phases of wound healing by: Decrease Necrotic Tissue to: 10 percent Decrease Necrotic Tissue - Progress: Progressing toward goal Increase Granulation Tissue to: 90% Increase Granulation Tissue - Progress: Progressing toward goal Improve Drainage Characteristics:  Min, Serous Improve Drainage Characteristics - Progress: Progressing toward goal Goals/treatment plan/discharge plan were made with and agreed upon by patient/family: Yes Time For Goal Achievement: 7 days Wound Therapy - Potential for Goals: Good  Goals will be updated until maximal potential achieved or discharge criteria met.  Discharge criteria: when goals achieved, discharge from hospital, MD decision/surgical intervention, no progress towards goals, refusal/missing three consecutive treatments without notification or medical reason.  GP     Charges PT Wound Care Charges $Wound Debridement up to 20 cm: < or equal to 20 cm $ Wound Debridement each add'l 20 sqcm: 5 $PT PLS Gun and Tip: 1 Supply $PT Hydrotherapy Visit: 1 Visit      Moishe Spice, PT, DPT Acute Rehabilitation Services  Pager: [phone] Office: [place] 04/11/2022, 1:41 PM

## 2022-04-11 NOTE — Consult Note (Signed)
[place] Nurse wound follow up Patient receiving care in [place]. Patient had already receiving hydrotherapy. Please see new photo of wound area. Wound type: unstageable to sacral/coccyx area Measurement: see PT hydrotherapy note from today for details. Wound bed: Definitely the wound non-viable tissue is softening. Today is yellow and brown, moving away from eschar.  Continue the current POC.  Val Riles, RN, MSN, CWOCN, CNS-BC, pager [phone]

## 2022-04-11 NOTE — Evaluation (Signed)
Clinical/Bedside Swallow Evaluation Patient Details  Name: Gabrielle [name] MRN: [id number] Date of Birth: 01-Nov-1956  Today's Date: 04/11/2022 Time: SLP Start Time (ACUTE ONLY): 1150 SLP Stop Time (ACUTE ONLY): 1211 SLP Time Calculation (min) (ACUTE ONLY): 21 min  Past Medical History:  Past Medical History:  Diagnosis Date   Cancer ([place]) 2010   rectal CA   Cataract    Diabetes mellitus without complication ([place])    Phreesia 11/15/2020   Eczema    Hyperlipidemia    Hypertension    Intrinsic (urethral) sphincter deficiency (ISD)    RBBB    SUI (stress urinary incontinence, female)    hx of   Type 2 diabetes mellitus ([place])    Past Surgical History:  Past Surgical History:  Procedure Laterality Date   ABDOMINAL HYSTERECTOMY  1998   fibroids, partial   ABDOMINOPLASTY  1999   ANGIOPLASTY Left 03/21/2022   Procedure: ANGIOPLASTY USING XENOSURE BIOLOGLIC PATCH ([id number]);  Surgeon: Broadus John, MD;  Location: [facility];  Service: Vascular;  Laterality: Left;   AORTA - BILATERAL FEMORAL ARTERY BYPASS GRAFT N/A 03/21/2022   Procedure: AORTOBIFEMORAL BYPASS GRAFT USING HEMASHIELD GOLD GRAFT (14x38m);  Surgeon: RBroadus John MD;  [facility]  Service: Vascular;  Laterality: N/A;   BLADDER SUSPENSION N/A 03/06/2014   Procedure: [facility] ;  Surgeon: JIrine Seal MD;  [facility]  Service: Urology;  Laterality: N/A;   CARDIOVASCULAR STRESS TEST  03-27-2008   NORMAL PERFUSION STUDY/  EF 54%   COLON SURGERY  2010   for rectal ca   COLONOSCOPY     ENDARTERECTOMY FEMORAL Bilateral 03/21/2022   Procedure: ENDARTERECTOMY FEMORAL BILATERAL;  Surgeon: RBroadus John MD;  [facility]  Service: Vascular;  Laterality: Bilateral;   EUS N/A 03/02/2017   Procedure: LOWER ENDOSCOPIC ULTRASOUND (EUS);  Surgeon: DMilus Banister MD;  [facility];  Service: Endoscopy;  Laterality: N/A;   EXCISION LOWER ABDOMINAL SCAR POST ABDOMINOPLASTY  01-05-2000    ORIF RIGHT ANKLE FX  12-24-2008   RIGHT ANKLE ARTHROSCOPY W/ DEBRIDEMENT AND OPEN REMOVAL HARDWARE  03-12-2010   TRANSTHORACIC ECHOCARDIOGRAM  08-05-2009   MILD LVH/  EF 60-65%   TUBAL LIGATION     HPI:  Patient is a 66y/o female who presents on 5/8 for open aortobifemoral bypass and Bilateral femoral endarterectomy complicated by MI, heart failure and shock, intubated 5/10-5/20. PMH includes rectal cancer, DM2, HTN.    Assessment / Plan / Recommendation  Clinical Impression  Pt exhibited signs of decreased airway protection during swallow assessment with husband at bedside. Oral motor is functional with generalized weakness and vocal quality is breathy following 10 day intubation (extubated 9 days ago). Consistent and immediate cough with water decreased to throat clear x 1 with nectar. She is impulsive taking multiple sips needing therapist to remove straw. Puree was timely and coordinated. Dentures not present and given her decreased attention, recommend puree (Dys 1) until spouse brings dentures and can be upgraded, nectar thick liquids, pills via Cortrak, full supervision for general precautions and due to impulsivity. ST continue and upgrade to thin at bedside or may need instrumental. SLP Visit Diagnosis: Dysphagia, unspecified (R13.10)    Aspiration Risk  Moderate aspiration risk;Mild aspiration risk    Diet Recommendation Dysphagia 1 (Puree);Nectar-thick liquid   Liquid Administration via: Straw;Cup Medication Administration: Via alternative means Supervision: Staff to assist with self feeding;Full supervision/cueing for compensatory strategies Compensations: Minimize environmental distractions;Slow rate;Small sips/bites Postural Changes:  Seated upright at 90 degrees    Other  Recommendations Oral Care Recommendations: Oral care BID    Recommendations for follow up therapy are one component of a multi-disciplinary discharge planning process, led by the attending physician.   Recommendations may be updated based on patient status, additional functional criteria and insurance authorization.  Follow up Recommendations  (TBD)      Assistance Recommended at Discharge Frequent or constant Supervision/Assistance  Functional Status Assessment Patient has had a recent decline in their functional status and demonstrates the ability to make significant improvements in function in a reasonable and predictable amount of time.  Frequency and Duration min 2x/week  2 weeks       Prognosis Prognosis for Safe Diet Advancement: Good Barriers to Reach Goals: Cognitive deficits      Swallow Study   General Date of Onset: 03/21/22 HPI: Patient is a 66 y/o female who presents on 5/8 for open aortobifemoral bypass and Bilateral femoral endarterectomy complicated by MI, heart failure and shock, intubated 5/10-5/20. PMH includes rectal cancer, DM2, HTN. Type of Study: Bedside Swallow Evaluation Previous Swallow Assessment:  (none) Diet Prior to this Study: NPO;NG Tube Temperature Spikes Noted: No Respiratory Status: Room air History of Recent Intubation: Yes Length of Intubations (days): 10 days Date extubated: 04/02/22 Behavior/Cognition: Alert;Cooperative;Distractible Oral Cavity Assessment: Within Functional Limits Oral Care Completed by SLP: No Oral Cavity - Dentition: Edentulous (dentures in the car per husband) Vision: Functional for self-feeding Self-Feeding Abilities: Able to feed self;Needs assist Patient Positioning: Upright in bed Baseline Vocal Quality: Breathy Volitional Cough: Weak    Oral/Motor/Sensory Function Overall Oral Motor/Sensory Function: Generalized oral weakness   Ice Chips Ice chips: Not tested   Thin Liquid Thin Liquid: Impaired Presentation: Straw Pharyngeal  Phase Impairments: Cough - Immediate    Nectar Thick Nectar Thick Liquid: Impaired Pharyngeal Phase Impairments: Throat Clearing - Immediate   Honey Thick Honey Thick Liquid: Not  tested   Puree Puree: Within functional limits   Solid     Solid: Not tested (dentures not available)      [place] Siren 04/11/2022,1:40 PM

## 2022-04-11 NOTE — Progress Notes (Signed)
NAME:  Gabrielle [name], MRN:  [id number], DOB:  19-Aug-1956, LOS: 21 ADMISSION DATE:  03/21/2022, CONSULTATION DATE:  03/22/22 REFERRING MD:  Unk Lightning, CHIEF COMPLAINT:  abd pain   History of Present Illness:  66 year old woman with hx of rectal cancer, DM2, HTN, HLD who presented for open aortobifemoral bypass by Dr. Unk Lightning for critical limb ischemia.  Postop course complicated by shock in the setting of stress cardiomyopathy.  Patient underwent procedure with 14-7 bifurcated dacron graft, bilateral femoral endarterectomy late 5/8pm.  EBL 1.5L.  Currently c/o SOB and abdominal discomfort. Developed respiratory distress 5/10, intubated.  ICU course complicated by ileus requiring TPN with malnutrition.  She was successfully extubated 5/20.    Pertinent  Medical History  DM2- A1c 9.3% HTN HLD Severe PVD  Significant Hospital Events: Including procedures, antibiotic start and stop dates in addition to other pertinent events   5/8 Admit, Aortobifemoral bypass using 14 x 7 bifurcated Dacron graft, Bilateral femoral endarterectomy, Left-sided profunda endarterectomy with bovine pericardial patch 5/9 PCCM consult for hypotension 5/10 worsening respiratory distress, intubated 5/11 diuresis 5/16 abdominal distention, increase bowel reg, attempts to wean from ventilator limited by low volumes 5/17 tachycardic, c/f STEMI, per cardiology no STEMI, PICC line placed for TPN 5/18 return of bowel function 5/20 extubated 5/22 Waxing/waning alertness, small bore feeding tube. Not answering questions.  5/25 No major issues overnight, mentation continues to wax and wane  5/26 Head CT pending  5/28 EN decreased overnight with abd pain 5/29 levemir held, EN at 35/hr    Interim History / Subjective:  Hgb stable WBC still 16 Levemir held for CBG 70s last night   Receiving wound care   Objective   Blood pressure (!) 104/91, pulse (!) 110, temperature 98.8 F (37.1 C), temperature source Oral, resp. rate  (!) 26, height '4\' 9"'$  (1.448 m), weight 58.9 kg, SpO2 97 %.        Intake/Output Summary (Last 24 hours) at 04/11/2022 0829 Last data filed at 04/11/2022 0700 Gross per 24 hour  Intake 1440 ml  Output 1200 ml  Net 240 ml   Filed Weights   04/09/22 0600 04/10/22 0330 04/11/22 0200  Weight: 60.6 kg 59.5 kg 58.9 kg    Examination: Asleep middle aged F NAD, on L side  NCAT cortrak secure  Extremities are warm, well perfused  Asleep, rouses to stimulation. Not following commands with  Even unlabored respiratory effort  Sacral deep tissue wound   Resolved Hospital Problem list   Postoperative shock Cardiogenic shoch  Postoperative ileus Acute hypoxemic respiratory failure AKI Acute metabolic encephalopathy/icu delirium Hypernatremia   Assessment & Plan:   Hypoactive ICU delirium, improving  P: -delirium precautions, mobility, sleep/wake cycles  Stress cardiomyopathy Acute systolic HF with improving EF  -appreciate Cardiology assistance with patient care > rec's for no LHC at this time given marked improvement in EF  P: -ASA, statin, spiro, coreg   PAD s/p aortobifem bypass P: -per VVS -ASA, statin  HTN HLD P -coreg, spiro, crestor   Anemia of critical illness, stable Leukocytosis, stable P: -can probably space out CBCs to QOD  -follow fever curve, if temp incr check cbc more fq   DM2  Acute hypoglycemia Inadequate PO intake -EN decr 5/28-29 for abd cramping -following, more hypoglycemic  P: -5/29 adv EN slowly as tolerated -- is down to rate of 35  -will dc the q4 EN coverage of 5units novolog -will decr levemir from 15 units BID to 10 units BID --  hold as needed -as EN adv, adjust insulin coverage   Sacral deep tissue injury P: -wound care per PT / WOC -frequent repositioning to offload -mobility  LLE paresis- has been there since surgery.  CT head benign P: -some slow improvement over apst few days -5/29 pt received pain meds for wound care  limiting assessment    Best Practice (right click and "Reselect all SmartList Selections" daily)  Diet/type: TF DVT prophylaxis: heparin ppx GI prophylaxis: N/A Lines: PICC, probably can remove soon Foley:  N/A Code Status:  full code Last date of multidisciplinary goals of care discussion: per primary.   Cct: n/a  Eliseo Gum MSN, AGACNP-BC [place] [phone] If no answer, [phone] 04/11/2022, 8:29 AM

## 2022-04-12 ENCOUNTER — Other Ambulatory Visit (HOSPITAL_COMMUNITY): Payer: Self-pay

## 2022-04-12 DIAGNOSIS — R410 Disorientation, unspecified: Secondary | ICD-10-CM

## 2022-04-12 DIAGNOSIS — I7409 Other arterial embolism and thrombosis of abdominal aorta: Secondary | ICD-10-CM | POA: Diagnosis not present

## 2022-04-12 DIAGNOSIS — I5021 Acute systolic (congestive) heart failure: Secondary | ICD-10-CM | POA: Diagnosis not present

## 2022-04-12 DIAGNOSIS — I739 Peripheral vascular disease, unspecified: Secondary | ICD-10-CM | POA: Diagnosis not present

## 2022-04-12 LAB — BASIC METABOLIC PANEL WITH GFR
Anion gap: 5 (ref 5–15)
BUN: 33 mg/dL — ABNORMAL HIGH (ref 8–23)
CO2: 22 mmol/L (ref 22–32)
Calcium: 8.6 mg/dL — ABNORMAL LOW (ref 8.9–10.3)
Chloride: 112 mmol/L — ABNORMAL HIGH (ref 98–111)
Creatinine, Ser: 0.76 mg/dL (ref 0.44–1.00)
GFR, Estimated: >60 mL/min
Glucose, Bld: 217 mg/dL — ABNORMAL HIGH (ref 70–99)
Potassium: 3.9 mmol/L (ref 3.5–5.1)
Sodium: 139 mmol/L (ref 135–145)

## 2022-04-12 LAB — GLUCOSE, CAPILLARY
Glucose-Capillary: 183 mg/dL — ABNORMAL HIGH (ref 70–99)
Glucose-Capillary: 186 mg/dL — ABNORMAL HIGH (ref 70–99)
Glucose-Capillary: 253 mg/dL — ABNORMAL HIGH (ref 70–99)
Glucose-Capillary: 213 mg/dL — ABNORMAL HIGH (ref 70–99)
Glucose-Capillary: 126 mg/dL — ABNORMAL HIGH (ref 70–99)

## 2022-04-12 LAB — APTT: aPTT: 32 seconds (ref 24–36)

## 2022-04-12 MED ORDER — VITAL 1.5 CAL PO LIQD
1000.0000 mL | ORAL | Status: DC
  Administered 2022-04-12 – 2022-04-14 (×3): 1000 mL
  Filled 2022-04-12 (×3): qty 1000

## 2022-04-12 MED ORDER — JUVEN PO PACK
1.0000 | PACK | Freq: Two times a day (BID) | ORAL | Status: DC
  Administered 2022-04-12 – 2022-05-01 (×36): 1
  Filled 2022-04-12 (×33): qty 1

## 2022-04-12 MED ORDER — PROSOURCE TF PO LIQD
45.0000 mL | Freq: Two times a day (BID) | ORAL | Status: DC
Start: 2022-04-12 — End: 2022-04-15
  Administered 2022-04-12 – 2022-04-14 (×6): 45 mL
  Filled 2022-04-12 (×6): qty 45

## 2022-04-12 MED ORDER — INSULIN DETEMIR 100 UNIT/ML ~~LOC~~ SOLN
15.0000 [IU] | Freq: Two times a day (BID) | SUBCUTANEOUS | Status: DC
  Administered 2022-04-12 – 2022-04-15 (×7): 15 [IU] via SUBCUTANEOUS
  Filled 2022-04-12 (×9): qty 0.15

## 2022-04-12 NOTE — TOC Progression Note (Signed)
Transition of Care [facility]) - Progression Note    Patient Details  Name: Gabrielle [name] MRN: [id number] Date of Birth: 04-05-1956  [facility] CM/SW Contact  Graves-Bigelow, Ocie Cornfield, RN Phone Number: 04/12/2022, 11:02 AM  Clinical Narrative: Case Manager received a secure chat from [employer] that the patients insurance declined [facility] admission. The reason for denial per insurance is that her care can be provided at the current hospital level and they will not approve an acute to acute transfer. Case Manager did speak with patient and husband regarding insurance denial. Case Manager will continue to follow the patient for disposition needs.   Expected Discharge Plan: SNF Barriers to Discharge: Continued Medical Work up  Expected Discharge Plan and Services Expected Discharge Plan: [place] (LTAC)   Discharge Planning Services: CM Consult Post Acute Care Choice: Long Term Acute Care (LTAC) Living arrangements for the past 2 months: Single Family Home                   DME Agency: NA       HH Arranged: NA     Readmission Risk Interventions     View : No data to display.

## 2022-04-12 NOTE — TOC Initial Note (Addendum)
Transition of Care [facility]) - Initial/Assessment Note    Patient Details  Name: Gabrielle [name] MRN: [id number] Date of Birth: 1956/07/11  Transition of Care [facility]) CM/SW Contact:    Milas Gain, [place] Phone Number: 04/12/2022, 4:30 PM  Clinical Narrative:                  CSW received consult for possible SNF placement at time of discharge. Due to patients current orientation CSW spoke with patients spouse regarding PT recommendation of SNF placement at time of discharge.  Patients spouse  expressed understanding of PT recommendation and is currently undecided on SNF placement at time of discharge. Patients spouse request for CSW to follow back up  in regards to dc plan for patient when patients getting close to being medically ready for dc. CSW informed patients spouse, patient currently on hydrotherapy and has cortrak . Patient will need to be off hydrotherapy and cortrak out before faxing out for snf if agreeable. CSW to follow up with patients spouse regarding SNF once closer to being off hydrotherapy and cortrak is out. CSW will continue to follow.  Expected Discharge Plan:  (TBD) Barriers to Discharge: Continued Medical Work up   Patient Goals and CMS Choice Patient states their goals for this hospitalization and ongoing recovery are:: TBD CMS Medicare.gov Compare Post Acute Care list provided to:: Patient Represenative (must comment) (Patients spouse) Choice offered to / list presented to : Spouse  Expected Discharge Plan and Services Expected Discharge Plan:  (TBD) In-house Referral: Clinical Social Work Discharge Planning Services: CM Consult Post Acute Care Choice: Long Term Acute Care (LTAC) Living arrangements for the past 2 months: Single Family Home                   DME Agency: NA       HH Arranged: NA          Prior Living Arrangements/Services Living arrangements for the past 2 months: Single Family Home Lives with:: Spouse Patient language and need for  interpreter reviewed:: Yes        Need for Family Participation in Patient Care: Yes (Comment) Care giver support system in place?: Yes (comment)   Criminal Activity/Legal Involvement Pertinent to Current Situation/Hospitalization: No - Comment as needed  Activities of Daily Living      Permission Sought/Granted Permission sought to share information with : Case Manager, Family Supports, Customer service manager Permission granted to share information with : No  Share Information with NAME: Due to patients current orientation CSW spoke with patients spouse [name]  Permission granted to share info w AGENCY: Due to patients current orientation CSW spoke with patients spouse [name]/SNF  Permission granted to share info w Relationship: spouse  Permission granted to share info w Contact Information: [name] [phone]  Emotional Assessment       Orientation: : Oriented to Self, Oriented to Place Alcohol / Substance Use: Not Applicable Psych Involvement: No (comment)  Admission diagnosis:  PAD (peripheral artery disease) ([place]) [I73.9] Aortoiliac occlusive disease ([place]) [I74.09] Patient Active Problem List   Diagnosis Date Noted   Delirium    Acute systolic heart failure ([place])    Protein-calorie malnutrition, severe 04/02/2022   PAD (peripheral artery disease) ([place]) 03/21/2022   Aortoiliac occlusive disease ([place]) 03/21/2022   History of colonic polyps 11/04/2019   Uncontrolled type 2 diabetes mellitus with hyperglycemia ([place]) 03/09/2018   Carcinoid tumor of rectum    Essential hypertension 02/02/2015   Atherosclerosis of native arteries of  extremity with intermittent claudication ([place]) 02/02/2015   Stress incontinence in female 03/06/2014   Diabetes mellitus ([place]) 02/28/2012   Pure hypercholesterolemia 02/28/2012   PCP:  Dorna Mai, MD Pharmacy:   [place], [place]  [address] Phone: [phone] Fax: [phone]  [facility] [address] [place] [address] Phone: [phone] Fax: [phone]     Social Determinants of Health (SDOH) Interventions    Readmission Risk Interventions     View : No data to display.

## 2022-04-12 NOTE — Progress Notes (Signed)
[facility]  Subjective  - follows some commands, seems a little confused.   Objective 122/66 (!) 109 98.7 F (37.1 C) (Axillary) 15 97%  Intake/Output Summary (Last 24 hours) at 04/12/2022 0762 Last data filed at 04/11/2022 2300 Gross per 24 hour  Intake 910 ml  Output 425 ml  Net 485 ml    Moving LE, will not wiggle toes Brisk DP doppler signals, no ischemic changes Abdomin soft, incision healing well, groins soft without hematomas NG tube in place for nutritional support Lungs non labored breathing    Assessment/Planning: 66 y.o. female is s/p:   aortobifemoral bypass  22 Days Post-Op  Abdomin soft, incisions healing well Brisk doppler signals with good inflow B LE BP stable Urine OP 800 ml last 24 hours HGB stable 9.1 Speech following-Moderate aspiration risk;Mild aspiration risk. Diet plan  Recommendation Dysphagia 1 (Puree);Nectar-thick liquid   NG tube feeding in place Hydrotherapy in place for sacral/coccyx area Leukocytosis on 5/29 no labs this am will re check 04/13/22   Roxy Horseman 04/12/2022 7:02 AM --  Laboratory Lab Results: Recent Labs    04/10/22 0306 04/11/22 0345  WBC 16.3* 16.8*  HGB 8.7* 9.1*  HCT 27.9* 30.1*  PLT 389 399   BMET Recent Labs    04/11/22 0345 04/12/22 0322  NA 143 139  K 4.6 3.9  CL 116* 112*  CO2 20* 22  GLUCOSE 114* 217*  BUN 40* 33*  CREATININE 0.87 0.76  CALCIUM 8.6* 8.6*    COAG Lab Results  Component Value Date   INR 1.1 03/27/2022   INR 1.2 03/26/2022   INR 1.1 03/25/2022   No results found for: PTT

## 2022-04-12 NOTE — Progress Notes (Signed)
Physical Therapy Treatment Patient Details Name: Gabrielle [name] MRN: [id number] DOB: 1956/05/18 Today's Date: 04/12/2022   History of Present Illness Pt is a 66 y.o. female admitted 03/21/22 for open aortobifemoral bypass and bilateral femoral endarterectomy, complicated by MI, HF, shock. ETT 5/10-5/20. Pt with waxing/waning mentation; head CT 5/26 negative for acute abnormality. Pt also with sacral wound. PMH includes rectal CA, DM2, HTN.   PT Comments    Pt demonstrates decreased interaction and inconsistent command following this session compared to prior PT session; difficult to determine true cognitive impairment vs fatigue vs desire to participate. Pt demonstrates improving sitting balance and tolerance; standing progression limited by decreased command following and resistance to certain movements. Pt's parents present and supportive; pt minimally interacting with them. Pt remains limited by generalized weakness, decreased activity tolerance, poor balance strategies/postural reactions and impaired cognition. Continue to recommend SNF-level therapies to maximize functional mobility and decrease caregiver burden prior to return home.    Recommendations for follow up therapy are one component of a multi-disciplinary discharge planning process, led by the attending physician.  Recommendations may be updated based on patient status, additional functional criteria and insurance authorization.  Follow Up Recommendations  Skilled nursing-short term rehab (<3 hours/day)     Assistance Recommended at Discharge Frequent or constant Supervision/Assistance  Patient can return home with the following Two people to help with walking and/or transfers;Two people to help with bathing/dressing/bathroom;Assistance with cooking/housework;Assist for transportation;Direct supervision/assist for financial management;Help with stairs or ramp for entrance;Direct supervision/assist for medications management    Equipment Recommendations  Wheelchair (measurements PT);Wheelchair cushion (measurements PT);Hospital bed;Hoyer lift   Recommendations for Other Services       Precautions / Restrictions Precautions Precautions: Fall;Other (comment) Precaution Comments: cortrak, sacral wound Restrictions Weight Bearing Restrictions: No     Mobility  Bed Mobility Overal bed mobility: Needs Assistance Bed Mobility: Rolling, Sidelying to Sit, Sit to Sidelying Rolling: Mod assist Sidelying to sit: Mod assist, HOB elevated     Sit to sidelying: Mod assist General bed mobility comments: performed 3x reps of sidelying<>sit with attempts to increase pt's engagement in task with each trial, pt ultimately requiring mod-maxA for BLE and trunk management; max multimodal cues for sequencing and attempting to engage pt in task    Transfers                   General transfer comment: deferred secondary to minimal command following and pt resisting static sitting activity    Ambulation/Gait                   Stairs             Wheelchair Mobility    Modified Rankin (Stroke Patients Only)       Balance Overall balance assessment: Needs assistance Sitting-balance support: No upper extremity supported, Feet unsupported, Single extremity supported Sitting balance-Leahy Scale: Fair Sitting balance - Comments: improved ability to maintain static sitting EOB with min guard for balance, periods of min-maxA to maintain midline as pt attempting to lay head towards foot board intermittently; pt repositioning self at EOB but not to cues                                    Cognition Arousal/Alertness: Awake/alert Behavior During Therapy: Flat affect, Restless Overall Cognitive Status: Difficult to assess Area of Impairment: Attention, Following commands, Safety/judgement, Awareness, Problem solving  General Comments: pt with  no verbalizations during session until PT leaving and states, "ok"; minimally nodding yes/no - max encouragement on communicating, but pt still not interacting much with therapist or family (parents) visiting. pt reaching to hold mom's hand. inconsistent simple command following. difficult to determine true cognitive impairment versus desire to participate        Exercises Other Exercises Other Exercises: pt moving BLEs spontaneously in bed (able to perform full hip/knee flex) and actively resisting attempts to roll from R-sidelying back to supine - demonstrates good strength, but not purposefully/to command    General Comments General comments (skin integrity, edema, etc.): pt's parents present and supportive      Pertinent Vitals/Pain Pain Assessment Pain Assessment: Faces Faces Pain Scale: Hurts little more Pain Location: pt not stating, expect grimacing/restlessness related to sacral wound Pain Descriptors / Indicators: Restless, Grimacing Pain Intervention(s): Monitored during session, Limited activity within patient's tolerance, Repositioned    Home Living                          Prior Function            PT Goals (current goals can now be found in the care plan section) Progress towards PT goals: Not progressing toward goals - comment (impaired cognition)    Frequency    Min 2X/week      PT Plan Current plan remains appropriate    Co-evaluation              AM-PAC PT "6 Clicks" Mobility   Outcome Measure  Help needed turning from your back to your side while in a flat bed without using bedrails?: A Lot Help needed moving from lying on your back to sitting on the side of a flat bed without using bedrails?: A Lot Help needed moving to and from a bed to a chair (including a wheelchair)?: Total Help needed standing up from a chair using your arms (e.g., wheelchair or bedside chair)?: Total Help needed to walk in hospital room?: Total Help needed  climbing 3-5 steps with a railing? : Total 6 Click Score: 8    End of Session Equipment Utilized During Treatment: Gait belt Activity Tolerance: Other (comment) (difficult to determine if limited by fatigue vs. cognition vs. desire to participate?) Patient left: in bed;with call bell/phone within reach;with bed alarm set;with family/visitor present Nurse Communication: Mobility status;Need for lift equipment PT Visit Diagnosis: Muscle weakness (generalized) (M62.81);Other abnormalities of gait and mobility (R26.89)     Time: 7209-4709 PT Time Calculation (min) (ACUTE ONLY): 33 min  Charges:  $Therapeutic Activity: 23-37 mins                     Mabeline Caras, PT, DPT Acute Rehabilitation Services  Pager [phone] Office [place] 04/12/2022, 5:22 PM

## 2022-04-12 NOTE — Progress Notes (Addendum)
NAME:  Gabrielle [name], MRN:  [id number], DOB:  27-Apr-1956, LOS: 9 ADMISSION DATE:  03/21/2022, CONSULTATION DATE:  03/22/22 REFERRING MD:  Unk Lightning, CHIEF COMPLAINT:  abd pain   History of Present Illness:  66 year old woman with hx of rectal cancer, DM2, HTN, HLD who presented for open aortobifemoral bypass by Dr. Unk Lightning for critical limb ischemia.  Postop course complicated by shock in the setting of stress cardiomyopathy.  Patient underwent procedure with 14-7 bifurcated dacron graft, bilateral femoral endarterectomy late 5/8pm.  EBL 1.5L.  Currently c/o SOB and abdominal discomfort. Developed respiratory distress 5/10, intubated.  ICU course complicated by ileus requiring TPN with malnutrition.  She was successfully extubated 5/20.    Pertinent  Medical History  DM2- A1c 9.3% HTN HLD Severe PVD  Significant Hospital Events: Including procedures, antibiotic start and stop dates in addition to other pertinent events   5/8 Admit, Aortobifemoral bypass using 14 x 7 bifurcated Dacron graft, Bilateral femoral endarterectomy, Left-sided profunda endarterectomy with bovine pericardial patch 5/9 PCCM consult for hypotension 5/10 worsening respiratory distress, intubated 5/11 diuresis 5/16 abdominal distention, increase bowel reg, attempts to wean from ventilator limited by low volumes 5/17 tachycardic, c/f STEMI, per cardiology no STEMI, PICC line placed for TPN 5/18 return of bowel function 5/20 extubated 5/22 Waxing/waning alertness, small bore feeding tube. Not answering questions.  5/25 No major issues overnight, mentation continues to wax and wane  5/26 Head CT pending  5/28 EN decreased overnight with abd pain 5/29 levemir held, EN at 35/hr  5/30: TF 70/hr   Interim History / Subjective:  TF running 70/hr Patient A/O; following commands  Objective   Blood pressure (!) 114/52, pulse (!) 110, temperature 99.3 F (37.4 C), temperature source Axillary, resp. rate (!) 28, height 4'  9" (1.448 m), weight 58.6 kg, SpO2 95 %.        Intake/Output Summary (Last 24 hours) at 04/12/2022 5397 Last data filed at 04/12/2022 0800 Gross per 24 hour  Intake 970 ml  Output 425 ml  Net 545 ml    Filed Weights   04/10/22 0330 04/11/22 0200 04/12/22 0500  Weight: 59.5 kg 58.9 kg 58.6 kg    Examination: General:  NAD HEENT: MM pink/moist; cor trak in place Neuro: AO; MAE CV: s1s2, no m/r/g PULM:  dim clear BS bilaterally GI: soft, bsx4 active  Extremities: warm/dry, no edema; scaral deep tissue wound with dressing in place  Resolved Hospital Problem list   Postoperative shock Cardiogenic shoch  Postoperative ileus Acute hypoxemic respiratory failure AKI Acute metabolic encephalopathy/icu delirium Hypernatremia   Assessment & Plan:   Hypoactive ICU delirium, improving  P: -delirium precautions in place -PT/OT -seroquel at bedtime  Stress cardiomyopathy Acute systolic HF with improving EF  -appreciate Cardiology assistance with patient care > rec's for no LHC at this time given marked improvement in EF  HTN HLD P: -cont ASA, statin, spiro, coreg, entresto -prn labetalol and hydralazine for htn  PAD s/p aortobifem bypass P: -per VVS -cont ASA and statin  Anemia of critical illness, stable Leukocytosis, stable P: -trend cbc  DM2  Acute hypoglycemia Inadequate PO intake -EN decr 5/28-29 for abd cramping -following, more hypoglycemic  P: -cont to advance TF slowly -ssi and cbg monitoring -increase basal insulin  Sacral deep tissue injury P: -WOC following -PT -reposition patient to offload pressure off injury  LLE paresis- has been there since surgery.  CT head benign P: -supportive care   Best Practice (right click and "  Reselect all SmartList Selections" daily)  Diet/type: TF DVT prophylaxis: heparin ppx GI prophylaxis: N/A Lines: PICC, probably can remove soon Foley:  N/A Code Status:  full code Last date of multidisciplinary  goals of care discussion: per primary.  JD Rexene Agent [facility] 04/12/2022, 9:45 AM  Please see [email or web address] for pager details.  From 7A-7P if no response, please call [phone]. After hours, please call ELink [phone].

## 2022-04-12 NOTE — TOC Benefit Eligibility Note (Signed)
Patient Teacher, English as a foreign language completed.    The patient is currently admitted and upon discharge could be taking Jardiance 10 mg.  The current 30 day co-pay is, $30.00.   The patient is insured through [place], [place] Patient [place] Patient Advocate Team Direct Number: [phone]  Fax: [phone]

## 2022-04-12 NOTE — Progress Notes (Signed)
Nutrition Follow-up  DOCUMENTATION CODES:   Severe malnutrition in context of acute illness/injury  INTERVENTION:   DO NOT recommend discontinuing TF and Cortrak until pt demonstrating ability to maintain adequate po intake by mouth; pt at VERY HIGH nutrition risk secondary to malnutrition, severe wound requiring hydrotherapy, severe deconditioning  Tube feeding via Cortrak: Pt not likely to tolerate nocturnal TF at this time, plan to continue 24 hours feedings Change to Vital 1.5 at 55 ml/hr Pro-Source TF 45 mL BID This provides 2060 kcals, 111 g of protein, 1003 mL of free water  Free water of 100 mL q 6 hours with TF provides 1403 mL of free water  Juven BID per tube, each packet provides 80 calories, 8 grams of carbohydrate, 2.5  grams of protein (collagen), 7 grams of L-arginine and 7 grams of L-glutamine; supplement contains CaHMB, Vitamins C, E, B12 and Zinc to promote wound healing  Magic cup TID with meals, each supplement provides 290 kcal and 9 grams of protein  Feeding assistance at meal times  Calorie Count ordered to better assess nutritional intake.   NUTRITION DIAGNOSIS:   Severe Malnutrition related to acute illness as evidenced by mild fat depletion, moderate muscle depletion, energy intake < or equal to 50% for > or equal to 5 days.  Being addressed via TF, diet  GOAL:   Patient will meet greater than or equal to 90% of their needs  Progressing  MONITOR:   Vent status, Weight trends, Labs, TF tolerance  REASON FOR ASSESSMENT:   Consult, Ventilator Enteral/tube feeding initiation and management  ASSESSMENT:   66 yo female admitted for aortobifemoral bypass due to right-sided Rutherford 4 critical limb ischemia, left-sided Rutherford 3 critical limb ischemia.  Pt developed postop shock and respiratory failure requiring intubation.  PMH includes DM, HTN, HLD, severe PAD  5/08 Aortobifemoral bypass using 14 x 7 bifurcated Dacron graft, Bilateral  femoral endarterectomy,  Left-sided profunda endarterectomy with bovine pericardial patch 5/09 PCCM consult for hypotension 5/10 Worsening respiratory status requiring intubation 5/11 Trickle TF initiated 5/12 Initiated TF titration 5/13 TF decreased back to trickle TF given abd distention: abd xray with dilated SB 5/17 TF continued at trickle TF rate until pt experienced large volume emesis with additional NG suction 1.5 L out, TPN recommended, Cortrak placed-gastric, unable to get PP 5/18 TPN initiated 5/20 Extubated  5/23 Abd xray with decreasing distention in SB and colon, Cortrak with tip in duodenum 5/25 XR shows cortrak tube in stomach 5/27 TPN discontinued 5/28 EN decreased to 35 ml/hr (half rate) due to abd pain 5/29 SLP advanced Diet to Dysphagia 1, Nectar Thick   Diet advanced but po intake is very poor.  Recorded po intake 10%. Pt ate bites at breakfast this AM per RN. Pt requires assistance at meal times  Vital AF 1.2 back to goal rate of 70 ml/hr via Cortrak  Hydrotherapy continues  Rectal tube in place, liquid stool  Labs: CBGs 151-215, Creatinine wdl, BUN 33 Meds: ss novolog, levemir  Diet Order:   Diet Order             DIET - DYS 1 Room service appropriate? Yes; Fluid consistency: Nectar Thick  Diet effective now                   EDUCATION NEEDS:   Not appropriate for education at this time  Skin:  Skin Assessment: Skin Integrity Issues: Skin Integrity Issues:: Unstageable DTI: perineum Unstageable: sacrococcygeal: seen by WOC  Last  BM:  5/26 - type 7, rectal tube in place  Height:   Ht Readings from Last 1 Encounters:  03/24/22 '4\' 9"'$  (1.448 m)    Weight:   Wt Readings from Last 1 Encounters:  04/12/22 58.6 kg    BMI:  Body mass index is 27.96 kg/m.  Estimated Nutritional Needs:   Kcal:  1900-2100 kcals  Protein:  110-130 g  Fluid:  >/= 1.9 L   Kerman Passey MS, RDN, LDN, CNSC Registered Dietitian III Clinical Nutrition RD  Pager and On-Call Pager Number Located in [place]

## 2022-04-12 NOTE — Progress Notes (Signed)
Speech Language Pathology Treatment: Dysphagia;Cognitive-Linquistic  Patient Details Name: Gabrielle [name] MRN: [id number] DOB: December 20, 1955 Today's Date: 04/12/2022 Time: 5809-9833 SLP Time Calculation (min) (ACUTE ONLY): 33 min  Assessment / Plan / Recommendation Clinical Impression  Pt slowly woke this am with husband present and continued to exhibit grogginess. Sustained attention waxed and waned, restless from wound on buttock needing moderate verbal and visual assist to focus on breakfast. Frequent repositioning as she continued to lean to right side.  Given the above, therapist did not attempt thin liquids this session. Upper denture plate present, cleaned and donned for trial of graham cracker which she held in oral cavity making no effort to masticate and removed. Nectar thick juice via spoon and eventually straw resulted in one subtle throat clear but no overt s/sx aspiration. Demonstrated delayed oral transit with grits and pineapple needing reminders to move tongue and propel. Recommend continue Dys 1 (puree) texture, nectar thick liquids, meds via tube and full assist/supervision with meals. ST to continue.    HPI HPI: Patient is a 66 y/o female who presents on 5/8 for open aortobifemoral bypass and Bilateral femoral endarterectomy complicated by MI, heart failure and shock, intubated 5/10-5/20. PMH includes rectal cancer, DM2, HTN.      SLP Plan  Continue with current plan of care      Recommendations for follow up therapy are one component of a multi-disciplinary discharge planning process, led by the attending physician.  Recommendations may be updated based on patient status, additional functional criteria and insurance authorization.    Recommendations  Diet recommendations: Dysphagia 1 (puree);Nectar-thick liquid Liquids provided via: Cup;Straw Medication Administration: Via alternative means Supervision: Staff to assist with self feeding;Full supervision/cueing for  compensatory strategies Compensations: Minimize environmental distractions;Slow rate;Small sips/bites Postural Changes and/or Swallow Maneuvers: Seated upright 90 degrees                Oral Care Recommendations: Oral care BID Follow Up Recommendations:  (TBD) Assistance recommended at discharge: Frequent or constant Supervision/Assistance SLP Visit Diagnosis: Dysphagia, oropharyngeal phase (R13.12) Plan: Continue with current plan of care           Houston Siren  04/12/2022, 8:59 AM

## 2022-04-13 DIAGNOSIS — I7409 Other arterial embolism and thrombosis of abdominal aorta: Secondary | ICD-10-CM | POA: Diagnosis not present

## 2022-04-13 DIAGNOSIS — R410 Disorientation, unspecified: Secondary | ICD-10-CM

## 2022-04-13 DIAGNOSIS — I739 Peripheral vascular disease, unspecified: Secondary | ICD-10-CM | POA: Diagnosis not present

## 2022-04-13 DIAGNOSIS — I5021 Acute systolic (congestive) heart failure: Secondary | ICD-10-CM | POA: Diagnosis not present

## 2022-04-13 LAB — GLUCOSE, CAPILLARY
Glucose-Capillary: 173 mg/dL — ABNORMAL HIGH (ref 70–99)
Glucose-Capillary: 218 mg/dL — ABNORMAL HIGH (ref 70–99)
Glucose-Capillary: 221 mg/dL — ABNORMAL HIGH (ref 70–99)
Glucose-Capillary: 228 mg/dL — ABNORMAL HIGH (ref 70–99)
Glucose-Capillary: 230 mg/dL — ABNORMAL HIGH (ref 70–99)
Glucose-Capillary: 280 mg/dL — ABNORMAL HIGH (ref 70–99)

## 2022-04-13 LAB — BASIC METABOLIC PANEL
Anion gap: 4 — ABNORMAL LOW (ref 5–15)
BUN: 34 mg/dL — ABNORMAL HIGH (ref 8–23)
CO2: 24 mmol/L (ref 22–32)
Calcium: 8.7 mg/dL — ABNORMAL LOW (ref 8.9–10.3)
Chloride: 117 mmol/L — ABNORMAL HIGH (ref 98–111)
Creatinine, Ser: 0.74 mg/dL (ref 0.44–1.00)
GFR, Estimated: >60 mL/min (ref >60)
Glucose, Bld: 219 mg/dL — ABNORMAL HIGH (ref 70–99)
Potassium: 3.8 mmol/L (ref 3.5–5.1)
Sodium: 145 mmol/L (ref 135–145)

## 2022-04-13 LAB — APTT: aPTT: 31 seconds (ref 24–36)

## 2022-04-13 LAB — CBC
HCT: 30 % — ABNORMAL LOW (ref 36.0–46.0)
Hemoglobin: 9.3 g/dL — ABNORMAL LOW (ref 12.0–15.0)
MCH: 28.7 pg (ref 26.0–34.0)
MCHC: 31 g/dL (ref 30.0–36.0)
MCV: 92.6 fL (ref 80.0–100.0)
Platelets: 396 10*3/uL (ref 150–400)
RBC: 3.24 MIL/uL — ABNORMAL LOW (ref 3.87–5.11)
RDW: 15.3 % (ref 11.5–15.5)
WBC: 15.8 10*3/uL — ABNORMAL HIGH (ref 4.0–10.5)
nRBC: 0 % (ref 0.0–0.2)

## 2022-04-13 MED ORDER — CARVEDILOL 12.5 MG PO TABS
12.5000 mg | ORAL_TABLET | Freq: Two times a day (BID) | ORAL | Status: DC
  Administered 2022-04-13 – 2022-04-19 (×8): 12.5 mg
  Filled 2022-04-13 (×8): qty 1

## 2022-04-13 MED ORDER — INSULIN ASPART 100 UNIT/ML IJ SOLN
3.0000 [IU] | INTRAMUSCULAR | Status: DC
  Administered 2022-04-13 – 2022-04-15 (×11): 3 [IU] via SUBCUTANEOUS

## 2022-04-13 NOTE — Progress Notes (Signed)
04/13/2022 1230 Received transfer from [facility] in to room 4E-13.  Pt is alert to voice, not verbalizing at this moment, will respond to painful stimuli.  Tele monitor applied and CCMD notified.  Oriented family to room, call light and bed.  Call bell in reach, family at bedside. [place]

## 2022-04-13 NOTE — Progress Notes (Signed)
Physical Therapy Wound Treatment Patient Details  Name: Gabrielle [name] MRN: [id number] Date of Birth: 1956-02-05  Today's Date: 04/13/2022 Time: 1015-1102 Time Calculation (min): 47 min  Subjective  Subjective Assessment Subjective: Pt did not state anything verbally, but did shake/nod her head on occasion to questions. Patient and Family Stated Goals: not stated Date of Onset:  (unknown) Prior Treatments: dressing changes  Pain Score:  Pre-medicated, fairly good pain tolerance  Wound Assessment  Pressure Injury 03/30/22 Sacrum Posterior;Right;Left Deep Tissue Pressure Injury - Purple or maroon localized area of discolored intact skin or blood-filled blister due to damage of underlying soft tissue from pressure and/or shear. large deep tissue inj (Active)  Wound Image  04/11/22 1330  Dressing Type Foam - Lift dressing to assess site every shift;Barrier Film (skin prep);Gauze (Comment);Other (Comment) 04/13/22 0958  Dressing Clean, Dry, Intact 04/13/22 0958  Dressing Change Frequency Daily 04/13/22 0958  State of Healing Eschar 04/13/22 0958  Site / Wound Assessment Brown;Yellow;Pink;Pale;Black 04/13/22 0958  % Wound base Red or Granulating 10% 04/13/22 0958  % Wound base Yellow/Fibrinous Exudate 45% 04/13/22 0958  % Wound base Black/Eschar 45% 04/13/22 0958  % Wound base Other/Granulation Tissue (Comment) 0% 04/13/22 0958  Peri-wound Assessment Erythema (non-blanchable);Pink;Purple 04/13/22 0958  Wound Length (cm) 10.25 cm 04/11/22 1330  Wound Width (cm) 11.25 cm 04/11/22 1330  Wound Depth (cm) 0.5 cm 04/11/22 1330  Wound Surface Area (cm^2) 115.31 cm^2 04/11/22 1330  Wound Volume (cm^3) 57.66 cm^3 04/11/22 1330  Tunneling (cm) 0 04/11/22 1330  Undermining (cm) 0 04/11/22 1330  Margins Unattached edges (unapproximated) 04/13/22 0958  Drainage Amount Moderate 04/13/22 0958  Drainage Description Serous 04/13/22 0958  Treatment Debridement (Selective);Irrigation;Packing (Dry  gauze);Other (Comment) 04/13/22 0958   Hydrotherapy Pulsed lavage therapy - wound location: sacral Pulsed Lavage with Suction (psi): 12 psi Pulsed Lavage with Suction - Normal Saline Used: 1000 mL Pulsed Lavage Tip: Tip with splash shield Selective Debridement Selective Debridement - Location: sacral Selective Debridement - Tools Used: Forceps, Scalpel, Scissors Selective Debridement - Tissue Removed: Black and yellow necrotic tissue with some necrotic adipose tissue    Wound Assessment and Plan  Wound Therapy - Assess/Plan/Recommendations Wound Therapy - Clinical Statement: Pt pre-medicated, tolerating session failry well, appearing to have fallen asleep for part of the session. Pt indicating pain with body gestures towards end of session. Her wound continues to make good, gradual progress with removal of nonviable tissue, displaying decreased black/brown necrotic tissue and increased healthy adipose tissue. Pt would benefit from further hydrotherapy to remove necrotic tissue and promote wound healing. Wound Therapy - Functional Problem List: functional debility after prolonged time in bed. Factors Delaying/Impairing Wound Healing: Immobility, Multiple medical problems, Vascular compromise, Diabetes Mellitus Hydrotherapy Plan: Dressing change, Debridement, Pulsatile lavage with suction, Patient/family education Wound Therapy - Frequency: 3X / week Wound Therapy - Current Recommendations: PT Wound Therapy - Follow Up Recommendations: dressing changes by RN, dressing changes by family/patient  Wound Therapy Goals- Improve the function of patient's integumentary system by progressing the wound(s) through the phases of wound healing (inflammation - proliferation - remodeling) by: Wound Therapy Goals - Improve the function of patient's integumentary system by progressing the wound(s) through the phases of wound healing by: Decrease Necrotic Tissue to: 10 percent Decrease Necrotic Tissue -  Progress: Progressing toward goal Increase Granulation Tissue to: 90% Increase Granulation Tissue - Progress: Progressing toward goal Improve Drainage Characteristics: Min, Serous Improve Drainage Characteristics - Progress: Progressing toward goal Goals/treatment plan/discharge plan were made with and  agreed upon by patient/family: Yes Time For Goal Achievement: 7 days Wound Therapy - Potential for Goals: Good  Goals will be updated until maximal potential achieved or discharge criteria met.  Discharge criteria: when goals achieved, discharge from hospital, MD decision/surgical intervention, no progress towards goals, refusal/missing three consecutive treatments without notification or medical reason.  GP     Charges PT Wound Care Charges $Wound Debridement up to 20 cm: < or equal to 20 cm $ Wound Debridement each add'l 20 sqcm: 5 $PT Hydrotherapy Visit: 1 Visit      Moishe Spice, PT, DPT Acute Rehabilitation Services  Pager: [phone] Office: [place] 04/13/2022, 11:17 AM

## 2022-04-13 NOTE — Progress Notes (Signed)
NAME:  Gabrielle [name], MRN:  [id number], DOB:  Dec 25, 1955, LOS: 23 ADMISSION DATE:  03/21/2022, CONSULTATION DATE:  03/22/22 REFERRING MD:  Unk Lightning, CHIEF COMPLAINT:  abd pain   History of Present Illness:  66 year old woman with hx of rectal cancer, DM2, HTN, HLD who presented for open aortobifemoral bypass by Dr. Unk Lightning for critical limb ischemia.  Postop course complicated by shock in the setting of stress cardiomyopathy.  Patient underwent procedure with 14-7 bifurcated dacron graft, bilateral femoral endarterectomy late 5/8pm.  EBL 1.5L.  Currently c/o SOB and abdominal discomfort. Developed respiratory distress 5/10, intubated.  ICU course complicated by ileus requiring TPN with malnutrition.  She was successfully extubated 5/20.    Pertinent  Medical History  DM2- A1c 9.3% HTN HLD Severe PVD  Significant Hospital Events: Including procedures, antibiotic start and stop dates in addition to other pertinent events   5/8 Admit, Aortobifemoral bypass using 14 x 7 bifurcated Dacron graft, Bilateral femoral endarterectomy, Left-sided profunda endarterectomy with bovine pericardial patch 5/9 PCCM consult for hypotension 5/10 worsening respiratory distress, intubated 5/11 diuresis 5/16 abdominal distention, increase bowel reg, attempts to wean from ventilator limited by low volumes 5/17 tachycardic, c/f STEMI, per cardiology no STEMI, PICC line placed for TPN 5/18 return of bowel function 5/20 extubated 5/22 Waxing/waning alertness, small bore feeding tube. Not answering questions.  5/25 No major issues overnight, mentation continues to wax and wane  5/26 Head CT pending  5/28 EN decreased overnight with abd pain 5/29 levemir held, EN at 35/hr  5/30: TF 70/hr 5/31: TF 50/hr   Interim History / Subjective:  TF running 50/hr Patient asleep but wakes to command  Objective   Blood pressure 109/68, pulse (!) 104, temperature 100 F (37.8 C), temperature source Axillary, resp. rate (!)  26, height '4\' 9"'$  (1.448 m), weight 58.1 kg, SpO2 98 %.        Intake/Output Summary (Last 24 hours) at 04/13/2022 0824 Last data filed at 04/13/2022 0800 Gross per 24 hour  Intake 1025 ml  Output 950 ml  Net 75 ml    Filed Weights   04/11/22 0200 04/12/22 0500 04/13/22 0500  Weight: 58.9 kg 58.6 kg 58.1 kg    Examination: General:  NAD HEENT: MM pink/moist; cor trak in place Neuro: opens eyes to name; PERRL CV: s1s2, no m/r/g PULM:  dim clear BS bilaterally GI: soft, bsx4 active  Extremities: warm/dry, no edema; scaral deep tissue wound with dressing in place  Resolved Hospital Problem list   Postoperative shock Cardiogenic shoch  Postoperative ileus Acute hypoxemic respiratory failure AKI Acute metabolic encephalopathy/icu delirium Hypernatremia   Assessment & Plan:   Hypoactive ICU delirium, improving  P: -delirium precautions in place -PT/OT -continue seroquel at bedtime  Stress cardiomyopathy Acute systolic HF with improving EF  -appreciate Cardiology assistance with patient care > rec's for no LHC at this time given marked improvement in EF  HTN HLD P: -a few episodes of soft bp -will decrease dose of coreg -cont ASA, statin, spiro, entresto -prn labetalol and hydralazine for htn  PAD s/p aortobifem bypass P: -per VVS -cont ASA and statin  Anemia of critical illness, stable Leukocytosis, stable P: -trend cbc  DM2  Acute hypoglycemia Inadequate PO intake -EN decr 5/28-29 for abd cramping -following, more hypoglycemic  P: -Glucose range 126-230 last 24 hours -cont to advance TF slowly -ssi and cbg monitoring -continue basal insulin -will add back TF coverage 3 units q4  Sacral deep tissue injury P: -  WOC following -PT -reposition patient to offload pressure off injury  LLE paresis- has been there since surgery.  CT head benign P: -supportive care   Best Practice (right click and "Reselect all SmartList Selections" daily)   Diet/type: TF DVT prophylaxis: heparin ppx GI prophylaxis: N/A Lines: PICC, probably can remove soon Foley:  N/A Code Status:  full code Last date of multidisciplinary goals of care discussion: per primary.  JD Rexene Agent [facility] 04/13/2022, 8:24 AM  Please see [email or web address] for pager details.  From 7A-7P if no response, please call [phone]. After hours, please call ELink [phone].

## 2022-04-13 NOTE — Progress Notes (Addendum)
[facility] [place]  Subjective  - does not verbally communicate, opens eyes and nods head   Objective 109/68 (!) 104 97.9 F (36.6 C) (Axillary) (!) 26 98%  Intake/Output Summary (Last 24 hours) at 04/13/2022 0719 Last data filed at 04/13/2022 0500 Gross per 24 hour  Intake 1095 ml  Output --  Net 1095 ml    Feet warm well perfused without ischemic changes Abdomin soft, producing BM.  Rectal tube in place Incisions healing well NG tube in place for nutritional support Lungs non labored breathing   Assessment/Planning: POD # 23  s/p:   aortobifemoral bypass   Incisions healing  LE well perfused BP controlled systolic  Recommendation Dysphagia 1 (Puree);Nectar-thick liquid     NG tube for nutritional suppliant in place until she improves her intake Leukocytosis 15.8, HGB stable 9.3 Acute blood loss anemia over hospitalization received 6 units PRBC, 2 FFP, and 1 PTL Plan for transfer to progressive 4E today Cont. Mobility and sacral wound care Hydrotherapy in place for sacral/coccyx area     Roxy Horseman 04/13/2022 7:19 AM --  VASCULAR STAFF ADDENDUM: I have independently interviewed and examined the patient. I agree with the above.  Stoic this morning, upset she was woken up.  Refused to answer questions and closed her eyes.  Will see later.  Can transfer.    Cassandria Santee, MD [facility] Phone Number: [phone] 04/13/2022 7:31 AM      Laboratory Lab Results: Recent Labs    04/11/22 0345 04/13/22 0440  WBC 16.8* 15.8*  HGB 9.1* 9.3*  HCT 30.1* 30.0*  PLT 399 396   BMET Recent Labs    04/12/22 0322 04/13/22 0440  NA 139 145  K 3.9 3.8  CL 112* 117*  CO2 22 24  GLUCOSE 217* 219*  BUN 33* 34*  CREATININE 0.76 0.74  CALCIUM 8.6* 8.7*    COAG Lab Results  Component Value Date   INR 1.1 03/27/2022   INR 1.2 03/26/2022   INR 1.1 03/25/2022   No  results found for: PTT

## 2022-04-13 NOTE — Progress Notes (Signed)
OT Cancellation Note  Patient Details Name: Gabrielle [name] MRN: [id number] DOB: 1956/01/30   Cancelled Treatment:    Reason Eval/Treat Not Completed: Patient's level of consciousness.  Patient not opening eyes, or responding to commands.  Continue efforts as appropriate.   Latonda Larrivee D Tsering Leaman 04/13/2022, 8:32 AM

## 2022-04-14 ENCOUNTER — Encounter (HOSPITAL_COMMUNITY): Payer: Self-pay | Admitting: Vascular Surgery

## 2022-04-14 LAB — CBC
HCT: 28.7 % — ABNORMAL LOW (ref 36.0–46.0)
Hemoglobin: 9.1 g/dL — ABNORMAL LOW (ref 12.0–15.0)
MCH: 29.1 pg (ref 26.0–34.0)
MCHC: 31.7 g/dL (ref 30.0–36.0)
MCV: 91.7 fL (ref 80.0–100.0)
Platelets: 364 10*3/uL (ref 150–400)
RBC: 3.13 MIL/uL — ABNORMAL LOW (ref 3.87–5.11)
RDW: 15.7 % — ABNORMAL HIGH (ref 11.5–15.5)
WBC: 15.6 10*3/uL — ABNORMAL HIGH (ref 4.0–10.5)
nRBC: 0 % (ref 0.0–0.2)

## 2022-04-14 LAB — GLUCOSE, CAPILLARY
Glucose-Capillary: 147 mg/dL — ABNORMAL HIGH (ref 70–99)
Glucose-Capillary: 175 mg/dL — ABNORMAL HIGH (ref 70–99)
Glucose-Capillary: 235 mg/dL — ABNORMAL HIGH (ref 70–99)
Glucose-Capillary: 253 mg/dL — ABNORMAL HIGH (ref 70–99)
Glucose-Capillary: 312 mg/dL — ABNORMAL HIGH (ref 70–99)
Glucose-Capillary: 314 mg/dL — ABNORMAL HIGH (ref 70–99)

## 2022-04-14 LAB — APTT: aPTT: 27 seconds (ref 24–36)

## 2022-04-14 LAB — BASIC METABOLIC PANEL
Anion gap: 5 (ref 5–15)
BUN: 43 mg/dL — ABNORMAL HIGH (ref 8–23)
CO2: 25 mmol/L (ref 22–32)
Calcium: 8.7 mg/dL — ABNORMAL LOW (ref 8.9–10.3)
Chloride: 118 mmol/L — ABNORMAL HIGH (ref 98–111)
Creatinine, Ser: 0.81 mg/dL (ref 0.44–1.00)
GFR, Estimated: >60 mL/min (ref >60)
Glucose, Bld: 199 mg/dL — ABNORMAL HIGH (ref 70–99)
Potassium: 3.7 mmol/L (ref 3.5–5.1)
Sodium: 148 mmol/L — ABNORMAL HIGH (ref 135–145)

## 2022-04-14 MED ORDER — ENSURE ENLIVE PO LIQD
237.0000 mL | Freq: Three times a day (TID) | ORAL | Status: DC
  Administered 2022-04-14 – 2022-05-01 (×32): 237 mL via ORAL

## 2022-04-14 MED ORDER — BOOST / RESOURCE BREEZE PO LIQD CUSTOM: 1.0000 | Freq: Three times a day (TID) | ORAL | Status: DC

## 2022-04-14 NOTE — Progress Notes (Signed)
Speech Language Pathology Treatment: Dysphagia  Patient Details Name: FIORELLA HANAHAN MRN: [id number] DOB: 09-Jun-1956 Today's Date: 04/14/2022 Time: 1000-1017 SLP Time Calculation (min) (ACUTE ONLY): 17 min  Assessment / Plan / Recommendation Clinical Impression  Patient alert but with decreased interaction with clinician, requiring max cues to verbalize. Vocal quality remains hoarse and with low intensity. She initially refused pos but was then agreeable to trials of thin liquid, consuming 8  ounces of thin water via consecutive straw sips without overt s/s of aspiration. No changes in vocal quality. She consumed only one bite of pureed solid before refusing further, complaining of stomach cramping due to needing to have a bowel movement. Inquired about placement of dentures for trials of advanced texture solids however per spouse, they were difficult to remove yesterday and they do not want them placed today. Will advance diet to puree and thin liquid. Hopefully this will increase at least liquid intake. SLP will f/u for tolerance.    HPI HPI: Patient is a 66 y/o female who presents on 5/8 for open aortobifemoral bypass and Bilateral femoral endarterectomy complicated by MI, heart failure and shock, intubated 5/10-5/20. PMH includes rectal cancer, DM2, HTN.      SLP Plan  Continue with current plan of care      Recommendations for follow up therapy are one component of a multi-disciplinary discharge planning process, led by the attending physician.  Recommendations may be updated based on patient status, additional functional criteria and insurance authorization.    Recommendations  Diet recommendations: Dysphagia 1 (puree);Thin liquid Liquids provided via: Straw Medication Administration: Whole meds with puree Supervision: Staff to assist with self feeding;Full supervision/cueing for compensatory strategies Compensations: Minimize environmental distractions;Slow rate;Small  sips/bites Postural Changes and/or Swallow Maneuvers: Seated upright 90 degrees                Oral Care Recommendations: Oral care BID Follow Up Recommendations: Acute inpatient rehab (3hours/day) Assistance recommended at discharge: Frequent or constant Supervision/Assistance SLP Visit Diagnosis: Dysphagia, oropharyngeal phase (R13.12) Plan: Continue with current plan of care          [facility] MA, [place]  04/14/2022, 10:21 AM

## 2022-04-14 NOTE — Progress Notes (Addendum)
  Progress Note    04/14/2022 7:34 AM 24 Days Post-Op  Subjective: no complaints. Denies pain   Vitals:   04/14/22 0100 04/14/22 0448  BP: (!) 129/58 (!) 101/51  Pulse: (!) 116 (!) 102  Resp: (!) 25 (!) 30  Temp: 98.9 F (37.2 C) 98 F (36.7 C)  SpO2: 95% 94%   Physical Exam: Cardiac:  regular, tachy Lungs: non labored Incisions:  intact and well appearing, healing well Extremities:  well perfused and warm  Abdomen:  distended, soft Neurologic: alert, responds to commands  CBC    Component Value Date/Time   WBC 15.6 (H) 04/14/2022 0546   RBC 3.13 (L) 04/14/2022 0546   HGB 9.1 (L) 04/14/2022 0546   HGB 14.5 03/22/2021 0916   HCT 28.7 (L) 04/14/2022 0546   HCT 43.9 03/22/2021 0916   PLT 364 04/14/2022 0546   PLT 261 03/22/2021 0916   MCV 91.7 04/14/2022 0546   MCV 90 03/22/2021 0916   MCH 29.1 04/14/2022 0546   MCHC 31.7 04/14/2022 0546   RDW 15.7 (H) 04/14/2022 0546   RDW 12.5 03/22/2021 0916   LYMPHSABS 2.8 02/03/2015 0527   MONOABS 0.4 02/03/2015 0527   EOSABS 0.1 02/03/2015 0527   BASOSABS 0.0 02/03/2015 0527    BMET    Component Value Date/Time   NA 148 (H) 04/14/2022 0546   NA 137 03/22/2021 0916   K 3.7 04/14/2022 0546   CL 118 (H) 04/14/2022 0546   CO2 25 04/14/2022 0546   GLUCOSE 199 (H) 04/14/2022 0546   BUN 43 (H) 04/14/2022 0546   BUN 11 03/22/2021 0916   CREATININE 0.81 04/14/2022 0546   CREATININE 0.74 11/11/2015 1921   CALCIUM 8.7 (L) 04/14/2022 0546   GFRNONAA >60 04/14/2022 0546   GFRNONAA >89 11/11/2015 1921   GFRAA 78 10/23/2020 1701   GFRAA >89 11/11/2015 1921    INR    Component Value Date/Time   INR 1.1 03/27/2022 0510     Intake/Output Summary (Last 24 hours) at 04/14/2022 0734 Last data filed at 04/13/2022 1000 Gross per 24 hour  Intake 250 ml  Output 950 ml  Net -700 ml     Assessment/Plan:  66 y.o. female is s/p aortobifemoral bypass 24 Days Post-Op   BLE well perfused and warm Abdomen distended but  soft Incisions are intact and healing well NG tube in place 250 cc output, will keep until increased po intake H & H stable VSS Continue therapies  DVT prophylaxis:  sq heparin   Karoline Caldwell, PA-C [facility] [phone] 04/14/2022 7:34 AM  VASCULAR STAFF ADDENDUM: I have independently interviewed and examined the patient. I agree with the above.  Overall I think Gabrielle [name] is progressing appropriately major.  Now include  Continued need for intensive physical therapy, Occupational Therapy Continued need for SLP-would like to progress p.o. intake with plans to remove the Dobbhoff tube once Rosaria Ferries is taking sufficient p.o. intake Sacral ulcer -appreciate wound care ostomy care.  Will leave the rectal tube and will remove at the discretion of wound care ostomy I had a long conversation with Gabrielle [name] and her husband this morning.  Her continued improvement is dependent on her participation. I appreciate hospital medicine following. No concerns from a surgical perspective at this time, all postoperative sequela.   Cassandria Santee, MD [facility] Phone Number: [phone] 04/14/2022 10:58 AM

## 2022-04-14 NOTE — Progress Notes (Signed)
Physical Therapy Treatment Patient Details Name: Gabrielle [name] MRN: [id number] DOB: 1955/12/25 Today's Date: 04/14/2022   History of Present Illness Pt is a 66 y.o. female admitted 03/21/22 for open aortobifemoral bypass and bilateral femoral endarterectomy, complicated by MI, HF, shock. ETT 5/10-5/20. Pt with waxing/waning mentation; head CT 5/26 negative for acute abnormality. Pt also with sacral wound. PMH includes rectal CA, DM2, HTN.   PT Comments    Pt seen for additional session for transfer back to bed after tolerating ~60-min seated in recliner on pressure relief cushion (offloaded to L-side). Pt with decreased interaction and command following this session, but ultimately tolerating maximove lift transfer ok with +2 assist. Noted leaking from flexiseal, pt dependent for pericare and washup at bed-level, able to assist with turns. Continue to recommend SNF-level therapies to maximize functional mobility and decrease caregiver burden prior to return home.    Recommendations for follow up therapy are one component of a multi-disciplinary discharge planning process, led by the attending physician.  Recommendations may be updated based on patient status, additional functional criteria and insurance authorization.  Follow Up Recommendations  Skilled nursing-short term rehab (<3 hours/day)     Assistance Recommended at Discharge Frequent or constant Supervision/Assistance  Patient can return home with the following Two people to help with walking and/or transfers;Two people to help with bathing/dressing/bathroom;Assistance with cooking/housework;Assist for transportation;Direct supervision/assist for financial management;Help with stairs or ramp for entrance;Direct supervision/assist for medications management   Equipment Recommendations  Wheelchair (measurements PT);Wheelchair cushion (measurements PT);Hospital bed;Hoyer lift   Recommendations for Other Services       Precautions /  Restrictions Precautions Precautions: Fall;Other (comment) Precaution Comments: cortrak, flexiseal, sacral wound Restrictions Weight Bearing Restrictions: No     Mobility  Bed Mobility Overal bed mobility: Needs Assistance Bed Mobility: Rolling Rolling: Max assist, Min assist   Supine to sit: Mod assist, +2 for safety/equipment, HOB elevated     General bed mobility comments: minA to roll on L-side as pt already attempting to roll that way to offload painful buttocks, maxA to roll to onto R-side; suspect resistance related to pain as pt physically able to assist; max verbal cues when to hold/release bed rails    Transfers Overall transfer level: Needs assistance Equipment used: Ambulation equipment used Transfers: Bed to chair/wheelchair/BSC, Sit to/from Stand Sit to Stand: Max assist, +2 physical assistance           General transfer comment: transfer from recliner to bed with maximove lift, pt tolerated well although grimacing in pain (suspect related to sacral wound); pt requiring max verbal cues for safety and hand over hand assist as pt pulling strong on lift bars Transfer via Lift Equipment: Maximove  Ambulation/Gait                   Stairs             Wheelchair Mobility    Modified Rankin (Stroke Patients Only)       Balance Overall balance assessment: Needs assistance Sitting-balance support: No upper extremity supported, Feet unsupported, Single extremity supported Sitting balance-Leahy Scale: Poor Sitting balance - Comments: pulling strong to sit on edge of recliner, but reliant on UE support; max cues for safety with this as pt pulling on lift bars   Standing balance support: Bilateral upper extremity supported, During functional activity Standing balance-Leahy Scale: Zero Standing balance comment: max assist +2 to stand in stedy (trunk control and BLE management)  Cognition Arousal/Alertness:  Awake/alert Behavior During Therapy: Flat affect, Restless, Impulsive Overall Cognitive Status: Difficult to assess Area of Impairment: Attention, Following commands, Safety/judgement, Problem solving                   Current Attention Level: Focused, Sustained   Following Commands: Follows one step commands inconsistently, Follows one step commands with increased time Safety/Judgement: Decreased awareness of safety, Decreased awareness of deficits Awareness: Intellectual Problem Solving: Slow processing, Decreased initiation, Difficulty sequencing, Requires verbal cues, Requires tactile cues General Comments: less verbalizations this session ("okay" "bye" "thank you"), decreased command following and more impulsive (grabbing at bars/lift/etc.) requiring hand over hand assist for safety        Exercises      General Comments General comments (skin integrity, edema, etc.): pt's husband [name]) present. flexiseal leaking, dependent for pericare bed-level      Pertinent Vitals/Pain Pain Assessment Pain Assessment: Faces Faces Pain Scale: Hurts even more Pain Location: pt does not specify - suspect sacral wound Pain Descriptors / Indicators: Restless, Grimacing Pain Intervention(s): Monitored during session, Limited activity within patient's tolerance, Repositioned    Home Living                          Prior Function            PT Goals (current goals can now be found in the care plan section) Acute Rehab PT Goals Patient Stated Goal: get stronger PT Goal Formulation: With family Time For Goal Achievement: 04/28/22 Potential to Achieve Goals: Fair Progress towards PT goals: Progressing toward goals (slowly)    Frequency    Min 2X/week      PT Plan Current plan remains appropriate    Co-evaluation PT/OT/SLP Co-Evaluation/Treatment: Yes Reason for Co-Treatment: Complexity of the patient's impairments (multi-system involvement);Necessary to  address cognition/behavior during functional activity;For patient/therapist safety;To address functional/ADL transfers PT goals addressed during session: Mobility/safety with mobility;Balance OT goals addressed during session: Strengthening/ROM      AM-PAC PT "6 Clicks" Mobility   Outcome Measure  Help needed turning from your back to your side while in a flat bed without using bedrails?: A Lot Help needed moving from lying on your back to sitting on the side of a flat bed without using bedrails?: A Lot Help needed moving to and from a bed to a chair (including a wheelchair)?: Total Help needed standing up from a chair using your arms (e.g., wheelchair or bedside chair)?: Total Help needed to walk in hospital room?: Total Help needed climbing 3-5 steps with a railing? : Total 6 Click Score: 8    End of Session Equipment Utilized During Treatment: Gait belt Activity Tolerance: Patient limited by pain Patient left: in bed;with call bell/phone within reach;with family/visitor present Nurse Communication: Mobility status;Need for lift equipment PT Visit Diagnosis: Muscle weakness (generalized) (M62.81);Other abnormalities of gait and mobility (R26.89);Pain     Time: 1204-1230 PT Time Calculation (min) (ACUTE ONLY): 26 min  Charges:  $Therapeutic Activity: 23-37 mins                     Mabeline Caras, PT, DPT Acute Rehabilitation Services  Pager [phone] Office [place] 04/14/2022, 4:44 PM

## 2022-04-14 NOTE — Progress Notes (Addendum)
Pt noted to be tachycardic and tachypneic in the last hour. Pt triggered Red MEWS. MD paged. Awaiting call back.   Raelyn Number, RN

## 2022-04-14 NOTE — Progress Notes (Signed)
Occupational Therapy Treatment Patient Details Name: Gabrielle [name] MRN: [id number] DOB: Feb 08, 1956 Today's Date: 04/14/2022   History of present illness Pt is a 66 y.o. female admitted 03/21/22 for open aortobifemoral bypass and bilateral femoral endarterectomy, complicated by MI, HF, shock. ETT 5/10-5/20. Pt with waxing/waning mentation; head CT 5/26 negative for acute abnormality. Pt also with sacral wound. PMH includes rectal CA, DM2, HTN.   OT comments  Patient received in bed with family member present. Patient was mod assist of 2 to get to EOB and required assistance with sitting balance due to lateral leaning, possible to for pressure relief and patient non verbal when asked. Patient was total assist transfer to recliner and stood x2 from recliner into stedy  with max assist +2 to progress with transfers and mobility. Patient left in recliner with family present. Acute OT to continue to follow.    Recommendations for follow up therapy are one component of a multi-disciplinary discharge planning process, led by the attending physician.  Recommendations may be updated based on patient status, additional functional criteria and insurance authorization.    Follow Up Recommendations  Skilled nursing-short term rehab (<3 hours/day)    Assistance Recommended at Discharge Frequent or constant Supervision/Assistance  Patient can return home with the following  Two people to help with walking and/or transfers;Two people to help with bathing/dressing/bathroom;Direct supervision/assist for medications management;Direct supervision/assist for financial management;Assist for transportation;Help with stairs or ramp for entrance   Equipment Recommendations  Other (comment) (TBD)    Recommendations for Other Services      Precautions / Restrictions Precautions Precautions: Fall;Other (comment) Precaution Comments: cortrak, sacral wound Restrictions Weight Bearing Restrictions: No        Mobility Bed Mobility Overal bed mobility: Needs Assistance Bed Mobility: Supine to Sit     Supine to sit: Mod assist, +2 for physical assistance, +2 for safety/equipment     General bed mobility comments: mod assist of 2 due to air matteress and left lateral leaning    Transfers Overall transfer level: Needs assistance Equipment used: Ambulation equipment used, None Transfers: Sit to/from Stand, Bed to chair/wheelchair/BSC Sit to Stand: Max assist, +2 physical assistance           General transfer comment: due to height of bed and on air matteress patient was total assist of 2 to transfer to recliner. Once in recliner patient performed 2 stands from recliner with max assist of 2 due to assistance needed at trunk and with BLEs Transfer via Lift Equipment: Stedy   Balance Overall balance assessment: Needs assistance Sitting-balance support: No upper extremity supported, Feet unsupported, Single extremity supported Sitting balance-Leahy Scale: Fair Sitting balance - Comments: patient required min guard to min assist for sitting balance due to attempting to sit to side possible to relieve pressure   Standing balance support: Bilateral upper extremity supported, During functional activity Standing balance-Leahy Scale: Zero Standing balance comment: max assist +2 to stand in stedy                           ADL either performed or assessed with clinical judgement   ADL Overall ADL's : Needs assistance/impaired                     Lower Body Dressing: Total assistance;Bed level Lower Body Dressing Details (indicate cue type and reason): donn socks  Extremity/Trunk Assessment Upper Extremity Assessment RUE Deficits / Details: moves against gravity, grossly 4-/5 LUE Deficits / Details: grossly 3+/5; grasp 3+/5, shoulder FF lacks initiation, generally weaker and less coordintaed than Right LUE Coordination: decreased gross motor             Vision       Perception     Praxis      Cognition Arousal/Alertness: Awake/alert Behavior During Therapy: Flat affect, Restless Overall Cognitive Status: Difficult to assess Area of Impairment: Attention, Following commands, Safety/judgement, Awareness, Problem solving                       Following Commands: Follows one step commands inconsistently, Follows one step commands with increased time Safety/Judgement: Decreased awareness of safety, Decreased awareness of deficits Awareness: Intellectual   General Comments: minimum to no verbalization during session.        Exercises      Shoulder Instructions       General Comments      Pertinent Vitals/ Pain       Pain Assessment Pain Assessment: Faces Faces Pain Scale: Hurts little more Pain Location: sacral wound Pain Descriptors / Indicators: Restless, Grimacing Pain Intervention(s): Limited activity within patient's tolerance, Monitored during session, Repositioned  Home Living                                          Prior Functioning/Environment              Frequency  Min 2X/week        Progress Toward Goals  OT Goals(current goals can now be found in the care plan section)  Progress towards OT goals: Progressing toward goals  Acute Rehab OT Goals OT Goal Formulation: Patient unable to participate in goal setting Time For Goal Achievement: 04/16/22 Potential to Achieve Goals: Fair ADL Goals Pt Will Perform Grooming: with set-up;sitting Pt Will Perform Upper Body Dressing: sitting;with mod assist Pt Will Perform Lower Body Dressing: with max assist;sit to/from stand Pt Will Transfer to Toilet: with max assist;stand pivot transfer;bedside commode Pt Will Perform Toileting - Clothing Manipulation and hygiene: with max assist;sit to/from stand Pt/caregiver will Perform Home Exercise Program: Both right and left upper extremity;With theraband;With written  HEP provided;With minimal assist Additional ADL Goal #1: Pt will perform bed mobility to come EOB as precursor to participation in ADL at [place] Discharge plan remains appropriate    Co-evaluation    PT/OT/SLP Co-Evaluation/Treatment: Yes Reason for Co-Treatment: To address functional/ADL transfers;For patient/therapist safety   OT goals addressed during session: Strengthening/ROM      AM-PAC OT "6 Clicks" Daily Activity     Outcome Measure   Help from another person eating meals?: A Lot Help from another person taking care of personal grooming?: A Lot Help from another person toileting, which includes using toliet, bedpan, or urinal?: Total Help from another person bathing (including washing, rinsing, drying)?: Total Help from another person to put on and taking off regular upper body clothing?: Total Help from another person to put on and taking off regular lower body clothing?: Total 6 Click Score: 8    End of Session Equipment Utilized During Treatment: Gait belt;Other (comment) Charlaine Dalton)  OT Visit Diagnosis: Unsteadiness on feet (R26.81);Other abnormalities of gait and mobility (R26.89);Muscle weakness (generalized) (M62.81);Other symptoms and signs involving cognitive function   Activity Tolerance  Patient tolerated treatment well   Patient Left in chair;with call bell/phone within reach;with chair alarm set;with family/visitor present   Nurse Communication Mobility status;Need for lift equipment        Time: 5974-1638 OT Time Calculation (min): 25 min  Charges: OT General Charges $OT Visit: 1 Visit OT Treatments $Therapeutic Activity: 8-22 mins  Lodema Hong, OTA Acute Rehabilitation Services  Pager [phone] Office [place] 04/14/2022, 1:33 PM

## 2022-04-14 NOTE — Progress Notes (Signed)
Physical Therapy Treatment Patient Details Name: Gabrielle [name] MRN: [id number] DOB: 23-Dec-1955 Today's Date: 04/14/2022   History of Present Illness Pt is a 66 y.o. female admitted 03/21/22 for open aortobifemoral bypass and bilateral femoral endarterectomy, complicated by MI, HF, shock. ETT 5/10-5/20. Pt with waxing/waning mentation; head CT 5/26 negative for acute abnormality. Pt also with sacral wound. PMH includes rectal CA, DM2, HTN.   PT Comments    Pt slowly progressing with mobility. Pt requires totalA+2 for transfer OOB due to short stature and high air mattress (~12'' between pt's feet dangling at EOB and floor). From recliner, pt performed standing with stedy, requiring maxA+2 for trunk and BLE management. Pt remains restless, suspect related to trying to get off painful buttocks. Pt with slight improvement in interaction this session (a few verbalizations and command following, tracking therapists with gaze somewhat); still difficult to determine extent of true cognitive impairment vs desire to communicate (husband reports pt talks with him). Pt remains limited by generalized weakness, decreased activity tolerance, poor balance strategies/postural reactions and impaired cognition. Will continue to follow acutely to address established goals.    Recommendations for follow up therapy are one component of a multi-disciplinary discharge planning process, led by the attending physician.  Recommendations may be updated based on patient status, additional functional criteria and insurance authorization.  Follow Up Recommendations  Skilled nursing-short term rehab (<3 hours/day)     Assistance Recommended at Discharge Frequent or constant Supervision/Assistance  Patient can return home with the following Two people to help with walking and/or transfers;Two people to help with bathing/dressing/bathroom;Assistance with cooking/housework;Assist for transportation;Direct supervision/assist for  financial management;Help with stairs or ramp for entrance;Direct supervision/assist for medications management   Equipment Recommendations  Wheelchair (measurements PT);Wheelchair cushion (measurements PT);Hospital bed;Hoyer lift   Recommendations for Other Services       Precautions / Restrictions Precautions Precautions: Fall;Other (comment) Precaution Comments: cortrak, flexiseal, sacral wound Restrictions Weight Bearing Restrictions: No     Mobility  Bed Mobility Overal bed mobility: Needs Assistance Bed Mobility: Supine to Sit     Supine to sit: Mod assist, +2 for safety/equipment, HOB elevated     General bed mobility comments: ModA for trunk elevation and scoot hips to EOB, +2 assist helpful due to high air mattress and pt with lateral lean    Transfers Overall transfer level: Needs assistance Equipment used: Ambulation equipment used Transfers: Bed to chair/wheelchair/BSC, Sit to/from Stand Sit to Stand: Max assist, +2 physical assistance           General transfer comment: pt unable to reach floor (~12'' gap) due to short stature and tall air mattress, therefore totalA+2 for transfer to recliner. From recliner, performed 2x standing trials with stedy frame, pt requiring maxA+2 for trunk elevation and BLE management. external assist to maintain BLEs positioned in midline with knees flexed, assist to achieve knee ext with standing, noted L knee hyperextension Transfer via Lift Equipment: Stedy  Ambulation/Gait                   Stairs             Wheelchair Mobility    Modified Rankin (Stroke Patients Only)       Balance Overall balance assessment: Needs assistance Sitting-balance support: No upper extremity supported, Feet unsupported, Single extremity supported Sitting balance-Leahy Scale: Fair Sitting balance - Comments: patient required min guard to min assist for sitting balance due to attempting to sit to side possible to relieve  pressure  Standing balance support: Bilateral upper extremity supported, During functional activity Standing balance-Leahy Scale: Zero Standing balance comment: max assist +2 to stand in stedy (trunk control and BLE management)                            Cognition Arousal/Alertness: Awake/alert Behavior During Therapy: Flat affect, Restless Overall Cognitive Status: Difficult to assess Area of Impairment: Attention, Following commands, Safety/judgement, Problem solving                   Current Attention Level: Focused, Sustained   Following Commands: Follows one step commands inconsistently, Follows one step commands with increased time Safety/Judgement: Decreased awareness of safety, Decreased awareness of deficits   Problem Solving: Slow processing, Decreased initiation, Difficulty sequencing, Requires verbal cues, Requires tactile cues General Comments: minimal verbalizations during session, although overall increase in pt's interaction this session (some eye contact, saying ~5 words in response to questions/comments, following some commands)        Exercises      General Comments General comments (skin integrity, edema, etc.): pt's husband [name]) present; on Facetime with daughter who was attempting to 'cheer' pt on (this did not seem to affect pt's participation)      Pertinent Vitals/Pain Pain Assessment Pain Assessment: Faces Faces Pain Scale: Hurts even more Pain Location: pt does not specify - suspect sacral wound Pain Descriptors / Indicators: Restless, Grimacing Pain Intervention(s): Monitored during session, Limited activity within patient's tolerance, Repositioned    Home Living                          Prior Function            PT Goals (current goals can now be found in the care plan section) Acute Rehab PT Goals Patient Stated Goal: get stronger PT Goal Formulation: With family Time For Goal Achievement:  04/28/22 Potential to Achieve Goals: Fair Progress towards PT goals: Progressing toward goals (slowly)    Frequency    Min 2X/week      PT Plan Current plan remains appropriate    Co-evaluation PT/OT/SLP Co-Evaluation/Treatment: Yes Reason for Co-Treatment: Complexity of the patient's impairments (multi-system involvement);Necessary to address cognition/behavior during functional activity;For patient/therapist safety;To address functional/ADL transfers PT goals addressed during session: Mobility/safety with mobility;Balance OT goals addressed during session: Strengthening/ROM      AM-PAC PT "6 Clicks" Mobility   Outcome Measure  Help needed turning from your back to your side while in a flat bed without using bedrails?: A Lot Help needed moving from lying on your back to sitting on the side of a flat bed without using bedrails?: A Lot Help needed moving to and from a bed to a chair (including a wheelchair)?: Total Help needed standing up from a chair using your arms (e.g., wheelchair or bedside chair)?: Total Help needed to walk in hospital room?: Total Help needed climbing 3-5 steps with a railing? : Total 6 Click Score: 8    End of Session Equipment Utilized During Treatment: Gait belt Activity Tolerance: Patient tolerated treatment well;Patient limited by pain Patient left: in chair;with call bell/phone within reach;with chair alarm set Nurse Communication: Mobility status;Need for lift equipment;Other (comment) (flexiseal clogged?) PT Visit Diagnosis: Muscle weakness (generalized) (M62.81);Other abnormalities of gait and mobility (R26.89);Pain     Time: 1055-1120 PT Time Calculation (min) (ACUTE ONLY): 25 min  Charges:  $Therapeutic Activity: 8-22 mins  Mabeline Caras, PT, DPT Acute Rehabilitation Services  Pager [phone] Office [place] 04/14/2022, 1:52 PM

## 2022-04-14 NOTE — Progress Notes (Signed)
Heart Failure Navigator Progress Note  Assessed for Heart & Vascular TOC clinic readiness.  Patient has had a improved  EF of 55-60 % . Marland Kitchen Stress cardiomyopathy, Rectal Cancer,    Gabrielle [name], BSN, RN Heart Failure Leisure centre manager Chat Only

## 2022-04-14 NOTE — Progress Notes (Signed)
Inpatient Diabetes Program Recommendations  AACE/ADA: New Consensus Statement on Inpatient Glycemic Control   Target Ranges:  Prepandial:   less than 140 mg/dL      Peak postprandial:   less than 180 mg/dL (1-2 hours)      Critically ill patients:  140 - 180 mg/dL    Latest Reference Range & Units 04/14/22 00:55 04/14/22 04:46 04/14/22 08:17  Glucose-Capillary 70 - 99 mg/dL 235 (H)  Novolog 10 units 175 (H)  Novolog 7 units 147 (H)  Novolog 6 units  Levemir 15 units    Latest Reference Range & Units 04/13/22 08:02 04/13/22 12:01 04/13/22 15:02 04/13/22 20:49  Glucose-Capillary 70 - 99 mg/dL 173 (H)  Novolog 4 units  Levemir 15 units 280 (H)  Novolog 14 units 218 (H)  Novolog 10 units 221 (H)  Novolog 10 units  Levemir 15 units   Review of Glycemic Control  Diabetes history: DM2 Outpatient Diabetes medications: Farxiga 10 mg daily, Metformin 1000 mg BID, Rybelsus 14 mg daily, Januvia 25 mg daily Current orders for Inpatient glycemic control: Levemir 15 units BID, Novolog 3 units Q4H, Novolog 0-20 units Q4H; Vital @ 50 ml/hr  Inpatient Diabetes Program Recommendations:    Insulin: If tube feeding is continued as ordered, may want to consider increasing to Novolog 5 units Q4H.    Thanks, Barnie Alderman, RN, MSN, [place] Diabetes Coordinator Inpatient Diabetes Program [phone] (Team Pager from 8am to [place])

## 2022-04-14 NOTE — Progress Notes (Addendum)
Nutrition Follow-up  DOCUMENTATION CODES:   Severe malnutrition in context of acute illness/injury  INTERVENTION:   DO NOT recommend discontinuing TF and Cortrak until pt demonstrating ability to maintain adequate po intake by mouth; pt at VERY HIGH nutrition risk secondary to malnutrition, unstageable wound requiring hydrotherapy, severe deconditioning.  Tube feeding via Cortrak:  Vital 1.5 at 55 ml/hr Pro-Source TF 45 mL BID Provides 2060 kcals, 111 g of protein, 1008 mL of free water.  Free water of 100 mL q 6 hours with TF provides 1403 mL of free water.  Juven BID per tube, each packet provides 80 calories, 8 grams of carbohydrate, 2.5  grams of protein (collagen), 7 grams of L-arginine and 7 grams of L-glutamine; supplement contains CaHMB, Vitamins C, E, B12 and Zinc to promote wound healing.  Magic cup TID with meals, each supplement provides 290 kcal and 9 grams of protein.  Add Ensure Plus High Protein po TID, each supplement provides 350 kcal and 20 grams of protein.  Feeding assistance at meal times and with supplements.  D/C Calorie Count.  NUTRITION DIAGNOSIS:   Severe Malnutrition related to acute illness as evidenced by mild fat depletion, moderate muscle depletion, energy intake < or equal to 50% for > or equal to 5 days.  Ongoing   GOAL:   Patient will meet greater than or equal to 90% of their needs  Met with TF  MONITOR:   Vent status, Weight trends, Labs, TF tolerance  REASON FOR ASSESSMENT:   Consult, Ventilator Enteral/tube feeding initiation and management  ASSESSMENT:   66 yo female admitted for aortobifemoral bypass due to right-sided Rutherford 4 critical limb ischemia, left-sided Rutherford 3 critical limb ischemia.  Pt developed postop shock and respiratory failure requiring intubation.  PMH includes DM, HTN, HLD, severe PAD  5/08 Aortobifemoral bypass using 14 x 7 bifurcated Dacron graft, Bilateral femoral endarterectomy,  Left-sided  profunda endarterectomy with bovine pericardial patch 5/09 PCCM consult for hypotension 5/10 Worsening respiratory status requiring intubation 5/11 Trickle TF initiated 5/12 Initiated TF titration 5/13 TF decreased back to trickle TF given abd distention: abd xray with dilated SB 5/17 TF continued at trickle TF rate until pt experienced large volume emesis with additional NG suction 1.5 L out, TPN recommended, Cortrak placed-gastric, unable to get PP 5/18 TPN initiated 5/20 Extubated  5/23 Abd xray with decreasing distention in SB and colon, Cortrak with tip in duodenum 5/25 XR shows cortrak tube in stomach 5/27 TPN discontinued 5/28 EN decreased to 35 ml/hr (half rate) due to abd pain 5/29 SLP advanced Diet to Dysphagia 1, Nectar Thick  6/01 SLP advanced diet to dysphagia 1 with thin liquids  S/P SLP follow-up this morning. Diet advanced to include thin liquids to hopefully increase fluid intake. Pt requires assistance at meal times.  No meal tickets saved for calorie count, however, with only bites eaten, patient is not meeting her nutrition needs. Will discontinue calorie count.  Currently receiving Vital 1.5 at 50 ml/h continuous x 24 hours via Cortrak with Prosource TF 45 ml BID. Free water flushes 100 ml every 6 hours. Patient is tolerating well per discussion with RN.  Intake of breakfast was minimal.  RD provided patient with a chocolate Ensure and she was able to take a few sips. She was unable to hold the bottle alone and it spilled on her gown and pillow.   Hydrotherapy continues to sacral wound.  Rectal tube in place, liquid stool.  Patient's visitor states that the tube  is bothering her.   Labs reviewed: Na 148 CBG: [phone]  Medications reviewed and include Novolog, Levemir, Juven, Protonix, Aldactone.  Diet Order:   Diet Order             DIET - DYS 1 Room service appropriate? Yes; Fluid consistency: Thin  Diet effective now                    EDUCATION NEEDS:   Not appropriate for education at this time  Skin:  Skin Assessment: Skin Integrity Issues: Skin Integrity Issues:: Unstageable DTI: perineum Unstageable: sacrococcygeal: seen by WOC  Last BM:  6/1 rectal tube  Height:   Ht Readings from Last 1 Encounters:  03/24/22 4' 9"  (1.448 m)    Weight:   Wt Readings from Last 1 Encounters:  04/13/22 58.1 kg    BMI:  Body mass index is 27.72 kg/m.  Estimated Nutritional Needs:   Kcal:  1900-2100 kcals  Protein:  110-130 g  Fluid:  >/= 1.9 L   Lucas Mallow RD, LDN, CNSC Please refer to Amion for contact information.

## 2022-04-14 NOTE — Progress Notes (Signed)
Pt noted to be febrile @ 101.5 axillary. MD paged. Awaiting call back   Raelyn Number, RN

## 2022-04-15 DIAGNOSIS — I739 Peripheral vascular disease, unspecified: Secondary | ICD-10-CM | POA: Diagnosis not present

## 2022-04-15 DIAGNOSIS — R410 Disorientation, unspecified: Secondary | ICD-10-CM | POA: Diagnosis not present

## 2022-04-15 DIAGNOSIS — I7409 Other arterial embolism and thrombosis of abdominal aorta: Secondary | ICD-10-CM | POA: Diagnosis not present

## 2022-04-15 DIAGNOSIS — R6510 Systemic inflammatory response syndrome (SIRS) of non-infectious origin without acute organ dysfunction: Secondary | ICD-10-CM

## 2022-04-15 DIAGNOSIS — I5021 Acute systolic (congestive) heart failure: Secondary | ICD-10-CM | POA: Diagnosis not present

## 2022-04-15 DIAGNOSIS — K661 Hemoperitoneum: Secondary | ICD-10-CM | POA: Diagnosis not present

## 2022-04-15 LAB — GLUCOSE, CAPILLARY
Glucose-Capillary: 159 mg/dL — ABNORMAL HIGH (ref 70–99)
Glucose-Capillary: 164 mg/dL — ABNORMAL HIGH (ref 70–99)
Glucose-Capillary: 168 mg/dL — ABNORMAL HIGH (ref 70–99)
Glucose-Capillary: 197 mg/dL — ABNORMAL HIGH (ref 70–99)
Glucose-Capillary: 279 mg/dL — ABNORMAL HIGH (ref 70–99)
Glucose-Capillary: 319 mg/dL — ABNORMAL HIGH (ref 70–99)

## 2022-04-15 LAB — CBC
HCT: 30.4 % — ABNORMAL LOW (ref 36.0–46.0)
Hemoglobin: 9.3 g/dL — ABNORMAL LOW (ref 12.0–15.0)
MCH: 28.3 pg (ref 26.0–34.0)
MCHC: 30.6 g/dL (ref 30.0–36.0)
MCV: 92.4 fL (ref 80.0–100.0)
Platelets: 355 10*3/uL (ref 150–400)
RBC: 3.29 MIL/uL — ABNORMAL LOW (ref 3.87–5.11)
RDW: 15.7 % — ABNORMAL HIGH (ref 11.5–15.5)
WBC: 19.6 10*3/uL — ABNORMAL HIGH (ref 4.0–10.5)
nRBC: 0.1 % (ref 0.0–0.2)

## 2022-04-15 LAB — BASIC METABOLIC PANEL
Anion gap: 6 (ref 5–15)
BUN: 43 mg/dL — ABNORMAL HIGH (ref 8–23)
CO2: 25 mmol/L (ref 22–32)
Calcium: 8.8 mg/dL — ABNORMAL LOW (ref 8.9–10.3)
Chloride: 117 mmol/L — ABNORMAL HIGH (ref 98–111)
Creatinine, Ser: 0.84 mg/dL (ref 0.44–1.00)
GFR, Estimated: >60 mL/min (ref >60)
Glucose, Bld: 223 mg/dL — ABNORMAL HIGH (ref 70–99)
Potassium: 4 mmol/L (ref 3.5–5.1)
Sodium: 148 mmol/L — ABNORMAL HIGH (ref 135–145)

## 2022-04-15 LAB — APTT: aPTT: 29 seconds (ref 24–36)

## 2022-04-15 MED ORDER — INSULIN ASPART 100 UNIT/ML IJ SOLN
0.0000 [IU] | Freq: Three times a day (TID) | INTRAMUSCULAR | Status: DC
  Administered 2022-04-17: 11 [IU] via SUBCUTANEOUS
  Administered 2022-04-17: 3 [IU] via SUBCUTANEOUS
  Administered 2022-04-18: 20 [IU] via SUBCUTANEOUS

## 2022-04-15 MED ORDER — INSULIN ASPART 100 UNIT/ML IJ SOLN
3.0000 [IU] | INTRAMUSCULAR | Status: DC
  Administered 2022-04-15: 3 [IU] via SUBCUTANEOUS

## 2022-04-15 MED ORDER — SODIUM CHLORIDE 0.45 % IV SOLN: INTRAVENOUS | Status: AC

## 2022-04-15 MED ORDER — INSULIN ASPART 100 UNIT/ML IJ SOLN: 5.0000 [IU] | INTRAMUSCULAR | Status: DC

## 2022-04-15 MED ORDER — INSULIN ASPART 100 UNIT/ML IJ SOLN
3.0000 [IU] | Freq: Three times a day (TID) | INTRAMUSCULAR | Status: DC
  Administered 2022-04-17 – 2022-04-20 (×9): 3 [IU] via SUBCUTANEOUS

## 2022-04-15 MED ORDER — INSULIN DETEMIR 100 UNIT/ML ~~LOC~~ SOLN
18.0000 [IU] | Freq: Two times a day (BID) | SUBCUTANEOUS | Status: DC
  Administered 2022-04-15 – 2022-04-17 (×4): 18 [IU] via SUBCUTANEOUS
  Filled 2022-04-15 (×7): qty 0.18

## 2022-04-15 NOTE — Progress Notes (Signed)
Inpatient Diabetes Program Recommendations  AACE/ADA: New Consensus Statement on Inpatient Glycemic Control   Target Ranges:  Prepandial:   less than 140 mg/dL      Peak postprandial:   less than 180 mg/dL (1-2 hours)      Critically ill patients:  140 - 180 mg/dL    Latest Reference Range & Units 04/14/22 08:17 04/14/22 13:01 04/14/22 16:48 04/14/22 20:05 04/15/22 00:13 04/15/22 04:20 04/15/22 07:55  Glucose-Capillary 70 - 99 mg/dL 147 (H) 314 (H) 253 (H) 312 (H) 159 (H) 197 (H) 279 (H)   Review of Glycemic Control  Outpatient Diabetes medications: Farxiga 10 mg daily, Metformin 1000 mg BID, Rybelsus 14 mg daily, Januvia 25 mg daily Current orders for Inpatient glycemic control: Levemir 15 units BID, Novolog 3 units Q4H, Novolog 0-20 units Q4H; Vital @ 50 ml/hr   Inpatient Diabetes Program Recommendations:     Insulin: Please consider increasing Levemir to 18 units BID.  If tube feeding is continued as ordered, please consider increasing tube feeding coverage to Novolog 5 units Q4H.     Thanks, Barnie Alderman, RN, MSN, [place] Diabetes Coordinator Inpatient Diabetes Program [phone] (Team Pager from 8am to [place])

## 2022-04-15 NOTE — Progress Notes (Signed)
Speech Language Pathology Treatment: Dysphagia  Patient Details Name: Gabrielle [name] MRN: [id number] DOB: Dec 31, 1955 Today's Date: 04/15/2022 Time: 1009-1030 SLP Time Calculation (min) (ACUTE ONLY): 21 min  Assessment / Plan / Recommendation Clinical Impression  Pt seen for dysphagia tx with intake of puree/thin via straw with impaired mastication, decreased oral manipulation and eventual removal of soft consistency from oral cavity after extended period (2 min).  Spouse stated pt has dentures, but they are ill-fitting and difficult to remove once placed per report.  Pt distracted d/t pain level from rectal tube with negative vocalizations and facial grimacing during tx session which impacted overall intake d/t inattention.   Pt consumed thin via straw without overt s/s of aspiration present.  Min verbal cues provided during PO trial for increasing swallow efficiency/speed and mastication efforts.  Pt would benefit from continued diet of Dysphagia 1/thin liquids d/t fatigue factor and difficulty in masticating/consuming consistencies past Dysphagia 1 at present time.  ST will f/u for diet tolerance and potential advancement in acute setting.   HPI HPI: Patient is a 66 y/o female who presents on 5/8 for open aortobifemoral bypass and Bilateral femoral endarterectomy complicated by MI, heart failure and shock, intubated 5/10-5/20. PMH includes rectal cancer, DM2, HTN; Seen 04/14/22 by ST and recommended Dysphagia 1/thin liquids; ST f/u for diet tolerance and potential upgrade.      SLP Plan  Continue with current plan of care      Recommendations for follow up therapy are one component of a multi-disciplinary discharge planning process, led by the attending physician.  Recommendations may be updated based on patient status, additional functional criteria and insurance authorization.    Recommendations  Diet recommendations: Dysphagia 1 (puree);Thin liquid Liquids provided via: Straw Medication  Administration: Whole meds with puree Supervision: Staff to assist with self feeding;Full supervision/cueing for compensatory strategies Compensations: Minimize environmental distractions;Slow rate;Small sips/bites Postural Changes and/or Swallow Maneuvers: Seated upright 90 degrees                Oral Care Recommendations: Oral care BID;Staff/trained caregiver to provide oral care Follow Up Recommendations: Acute inpatient rehab (3hours/day) Assistance recommended at discharge: Frequent or constant Supervision/Assistance SLP Visit Diagnosis: Dysphagia, oropharyngeal phase (R13.12) Plan: Continue with current plan of care           [name], M.S., CCC-SLP  04/15/2022, 11:26 AM

## 2022-04-15 NOTE — Plan of Care (Signed)

## 2022-04-15 NOTE — Consult Note (Addendum)
Initial Consultation Note   Patient: Gabrielle [name] OVF:[id number] DOB: Nov 18, 1955 PCP: Dorna Mai, MD DOA: 03/21/2022 DOS: the patient was seen and examined on 04/15/2022 Primary service: Broadus John, MD   Reason for consult: Medical management   Assessment and Plan: PAD  S/p aortobifemoral bypass 5/8 with Dr. Unk Lightning -Continue aspirin and statin -Management per vascular surgery  Leukocytosis SIRS Acute on chronic.  White blood cell count elevated at 19.6.  Patient was noted to be febrile up to 101.5 F yesterday evening, but has not had any reoccurrence recently.  She meets SIRS criteria with the tachypnea, tachycardia, and elevated white blood cell count.  She has a PICC line in place, but did not look infected.. -Check blood cultures -Check chest x-ray  -Check check urinalysis -Would recommend starting empiric antibiotics if patient develops fever again  Metabolic encephalopathy/delirium CT scan of the brain from/26 did not note any acute abnormalities. -Continue delirium precautions -Continue Seroquel  Hypernatremia Acute.  Sodium noted to be 148 on labs today.  Given elevated BUN to creatinine ratio suspect may be over diuresed. -Give 1/2 Normal saline IV fluids 100 mL/h for total of 500 mL -Held spironolactone.  Consider discussing with cardiology to determine if and when to resume.  Dysphagia Previously on tube feeds.  Speech recommending dysphagia 1 diet.  Diabetes mellitus type 2 -Increased Levemir to 18 units twice daily per diabetic education recommendations -Changed insulin regimen to 3 times daily with meals. -Reassess in a.m. and adjust regimen as needed  Stress cardiomyopathy systolic congestive heart failure Initial echocardiogram on 5/10 had noted EF of 20-25% with grade 2 diastolic dysfunction.  Repeat echocardiogram from 5/21 noted EF to be 55- 60% with grade 1 diastolic dysfunction.  Cardiology has been consulted and recommended no need of left  heart cath given marked improvement in EF.  No signs of fluid overload at this time. -Continue aspirin, statin, Entresto, and Coreg  Sacral deep tissue injury -Continue wound care and offloading  Normocytic anemia Hemoglobin 9.3 g/dL which appears stable.  Left lower extremity paresis Reported have been present since surgery.  CT scan of the head benign.  GERD -Continue Protonix  TRH will continue to follow the patient.  HPI: Gabrielle [name] is a 66 y.o. female with past medical history of hypertension, hyperlipidemia, DM type II, and rectal cancer who presented for open aortobifemoral bypass by Dr. Unk Lightning for critical limb ischemia on 5/8.  Postop course was complicated by shock in the setting of stress cardiomyopathy for which PCCM consulted on 5/9 due to hypotension.  Subsequently, requiring intubation on 5/10 due to worsening respiratory distress.  While in ICU developed an ileus requiring TPN due for malnutrition.  Patient was successfully able to be extubated on 5/20.  Patient was noted to have mental status changes for which CT scan of the head was obtained but did not note any acute abnormality.  She was seen by speech on 6/1 and recommended dysphagia 1 diet (pured) with thin liquids.   Review of Systems: As mentioned in the history of present illness. All other systems reviewed and are negative. Past Medical History:  Diagnosis Date   Cancer [facility]) 2010   rectal CA   Cataract    Diabetes mellitus without complication ([place])    Phreesia 11/15/2020   Eczema    Hyperlipidemia    Hypertension    Intrinsic (urethral) sphincter deficiency (ISD)    RBBB    SUI (stress urinary incontinence, female)  hx of   Type 2 diabetes mellitus ([place])    Past Surgical History:  Procedure Laterality Date   ABDOMINAL HYSTERECTOMY  1998   fibroids, partial   ABDOMINOPLASTY  1999   ANGIOPLASTY Left 03/21/2022   Procedure: ANGIOPLASTY USING XENOSURE BIOLOGLIC PATCH (2LMB8ML);  Surgeon:  Broadus John, MD;  Location: [place];  Service: Vascular;  Laterality: Left;   AORTA - BILATERAL FEMORAL ARTERY BYPASS GRAFT N/A 03/21/2022   Procedure: AORTOBIFEMORAL BYPASS GRAFT USING HEMASHIELD GOLD GRAFT (14x57m);  Surgeon: RBroadus John MD;  [place]  Service: Vascular;  Laterality: N/A;   BLADDER SUSPENSION N/A 03/06/2014   [facility] ;  Surgeon: JIrine Seal MD;  [facility]  Service: U[facility];  Laterality: N/A;   CARDIOVASCULAR STRESS TEST  03-27-2008   NORMAL PERFUSION STUDY/  EF 54%   COLON SURGERY  2010   for rectal ca   COLONOSCOPY     ENDARTERECTOMY FEMORAL Bilateral 03/21/2022   Procedure: ENDARTERECTOMY FEMORAL BILATERAL;  Surgeon: RBroadus John MD;  [place]  Service: Vascular;  Laterality: Bilateral;   EUS N/A 03/02/2017   Procedure: LOWER ENDOSCOPIC ULTRASOUND (EUS);  Surgeon: DMilus Banister MD;  [facility];  Service: Endoscopy;  Laterality: N/A;   EXCISION LOWER ABDOMINAL SCAR POST ABDOMINOPLASTY  01-05-2000   ORIF RIGHT ANKLE FX  12-24-2008   RIGHT ANKLE ARTHROSCOPY W/ DEBRIDEMENT AND OPEN REMOVAL HARDWARE  03-12-2010   TRANSTHORACIC ECHOCARDIOGRAM  08-05-2009   MILD LVH/  EF 60-65%   TUBAL LIGATION     Social History:  reports that she quit smoking about 15 years ago. Her smoking use included cigarettes. She has never used smokeless tobacco. She reports that she does not drink alcohol and does not use drugs.  No Known Allergies  Family History  Problem Relation Age of Onset   Diabetes Mother    Hypertension Mother    Diabetes Sister    Hypertension Sister    Diabetes Brother    Hypertension Brother    Lupus Daughter    Colon cancer Neg Hx    Esophageal cancer Neg Hx    Rectal cancer Neg Hx    Stomach cancer Neg Hx    Colon polyps Neg Hx     Prior to Admission medications   Medication Sig Start Date End Date Taking? Authorizing Provider  amLODipine (NORVASC) 10 MG tablet Take 1 tablet  (10 mg total) by mouth daily. 11/26/21  Yes WDorna Mai MD  aspirin 81 MG tablet Take 81 mg by mouth daily.   Yes [provider]  atorvastatin (LIPITOR) 40 MG tablet Take 1 tablet (40 mg total) by mouth every morning. 11/26/21  Yes WDorna Mai MD  dapagliflozin propanediol (FARXIGA) 10 MG TABS tablet Take 1 tablet (10 mg total) by mouth daily. 11/26/21  Yes WDorna Mai MD  lisinopril (ZESTRIL) 10 MG tablet TAKE 1 TABLET BY MOUTH ONCE DAILY IN THE MORNING 11/26/21  Yes WDorna Mai MD  metFORMIN (GLUCOPHAGE) 1000 MG tablet TAKE 1 TABLET BY MOUTH TWICE DAILY WITH A MEAL 11/26/21  Yes WDorna Mai MD  Semaglutide (RYBELSUS) 14 MG TABS Take 14 mg by mouth daily. 11/26/21  Yes WDorna Mai MD  sitaGLIPtin (JANUVIA) 25 MG tablet Take 25 mg by mouth daily.   Yes [provider]  gabapentin (NEURONTIN) 300 MG capsule Take 1 capsule (300 mg total) by mouth at bedtime. Patient taking differently: Take 300 mg by mouth at bedtime as needed (  pain). 11/26/21   Dorna Mai, MD  glucose blood (ACCU-CHEK SMARTVIEW) test strip PATIENT NEEDS OFFICE VISIT FOR ADDITIONAL REFILLS - very overdue for DM check up. 11/11/15   Joretta Bachelor, PA    Physical Exam: Vitals:   04/15/22 0015 04/15/22 0419 04/15/22 0800 04/15/22 1151  BP: 111/61 131/64 (!) 121/57 (!) 139/97  Pulse: (!) 107 (!) 109 (!) 118 (!) 118  Resp: 20 19 (!) 22 20  Temp: 98.5 F (36.9 C) 99.5 F (37.5 C) 99.3 F (37.4 C) 98.7 F (37.1 C)  TempSrc: Oral Oral Oral Oral  SpO2: 96% 96% 96% 100%  Weight:      Height:        Constitutional: Elderly female currently appears restless trying to get out of the hospital bed. Eyes: PERRL, lids and conjunctivae normal ENMT: Mucous membranes dry.  Cortrac in place Neck: normal, supple, no masses, no thyromegaly Respiratory: clear to auscultation bilaterally, no wheezing, no crackles. Normal respiratory effort. No accessory muscle use.  Cardiovascular: Tachycardic,  no murmurs / rubs / gallops. No extremity edema.  PICC line present of the right upper extremity Abdomen: no tenderness, no masses palpated. Bowel sounds positive.  Musculoskeletal: no clubbing / cyanosis. No joint deformity upper and lower extremities. Good ROM, no contractures. Normal muscle tone.  Skin: Wound of the right gluteal currently covered. Neurologic: CN 2-12 grossly intact.  Weakness on the left lower extremity Psychiatric: Alert unable to assess for orientation as patient is not currently speaking.  Data Reviewed:   Reviewed labs, imaging, and pertinent records.   Family Communication: Husband updated at bedside Primary team communication:  Thank you very much for involving [place] in the care of your patient.  Author: Norval Morton, MD 04/15/2022 2:06 PM  For on call review [email or web address].

## 2022-04-15 NOTE — Progress Notes (Signed)
Mobility Specialist Progress Note    04/15/22 1259  Mobility  Activity Transferred from bed to chair  Level of Assistance +2 (takes two people)  Assistive Device MaxiMove  Activity Response Tolerated well  $Mobility charge 1 Mobility   Left with call bell in reach and husband present. Attempting BM. RN notified.   Hildred Alamin Mobility Specialist

## 2022-04-15 NOTE — Progress Notes (Signed)
Physical Therapy Wound Treatment Patient Details  Name: Gabrielle [name] MRN: [id number] Date of Birth: 03-19-1956  Today's Date: 04/15/2022 Time: 3662-9476 Time Calculation (min): 56 min  Subjective  Subjective Assessment Subjective: Pt stated "oh no" at end of session, but otherwise was nonverbal and smiled 2x at start of session. Patient and Family Stated Goals: not stated Date of Onset:  (unknown) Prior Treatments: dressing changes  Pain Score:  Pre-medicated, but pt with poor pain tolerance  Wound Assessment  Pressure Injury 03/30/22 Sacrum Posterior;Right;Left Deep Tissue Pressure Injury - Purple or maroon localized area of discolored intact skin or blood-filled blister due to damage of underlying soft tissue from pressure and/or shear. large deep tissue inj (Active)  Wound Image  04/11/22 1330  Dressing Type Foam - Lift dressing to assess site every shift;Barrier Film (skin prep);Gauze (Comment);Other (Comment) 04/15/22 1527  Dressing Clean, Dry, Intact 04/15/22 1527  Dressing Change Frequency Daily 04/15/22 1527  State of Healing Eschar 04/15/22 1527  Site / Wound Assessment Brown;Yellow;Pink;Pale;Black 04/15/22 1527  % Wound base Red or Granulating 15% 04/15/22 1527  % Wound base Yellow/Fibrinous Exudate 50% 04/15/22 1527  % Wound base Black/Eschar 35% 04/15/22 1527  % Wound base Other/Granulation Tissue (Comment) 0% 04/15/22 1527  Peri-wound Assessment Erythema (non-blanchable);Pink;Purple 04/15/22 1527  Wound Length (cm) 10.25 cm 04/13/22 0958  Wound Width (cm) 11.25 cm 04/13/22 0958  Wound Depth (cm) 0.5 cm 04/13/22 0958  Wound Surface Area (cm^2) 115.31 cm^2 04/13/22 0958  Wound Volume (cm^3) 57.66 cm^3 04/13/22 0958  Tunneling (cm) 0 04/11/22 1330  Undermining (cm) 0 04/11/22 1330  Margins Unattached edges (unapproximated) 04/15/22 1527  Drainage Amount Moderate 04/15/22 1527  Drainage Description Serosanguineous 04/15/22 1527  Treatment Debridement  (Selective);Irrigation;Packing (Dry gauze);Other (Comment) 04/15/22 1527   Hydrotherapy Pulsed lavage therapy - wound location: sacral Pulsed Lavage with Suction (psi): 12 psi Pulsed Lavage with Suction - Normal Saline Used: 1000 mL Pulsed Lavage Tip: Tip with splash shield Selective Debridement Selective Debridement - Location: sacral Selective Debridement - Tools Used: Forceps, Scalpel Selective Debridement - Tissue Removed: Black and yellow necrotic tissue with some necrotic adipose tissue    Wound Assessment and Plan  Wound Therapy - Assess/Plan/Recommendations Wound Therapy - Clinical Statement: Pt pre-medicated, but she was indicating pain with body gestures throughout session and was very restless, limiting the amount of selective debridement that could be safely performed. Pt's wound bed continues to display gradual improvement with reduction of nonviable tissue. Pt would benefit from further hydrotherapy to remove necrotic tissue and promote wound healing. Wound Therapy - Functional Problem List: functional debility after prolonged time in bed. Factors Delaying/Impairing Wound Healing: Immobility, Multiple medical problems, Vascular compromise, Diabetes Mellitus Hydrotherapy Plan: Dressing change, Debridement, Pulsatile lavage with suction, Patient/family education Wound Therapy - Frequency: 3X / week Wound Therapy - Current Recommendations: PT Wound Therapy - Follow Up Recommendations: dressing changes by RN, dressing changes by family/patient  Wound Therapy Goals- Improve the function of patient's integumentary system by progressing the wound(s) through the phases of wound healing (inflammation - proliferation - remodeling) by: Wound Therapy Goals - Improve the function of patient's integumentary system by progressing the wound(s) through the phases of wound healing by: Decrease Necrotic Tissue to: 10 percent Decrease Necrotic Tissue - Progress: Progressing toward goal Increase  Granulation Tissue to: 90% Increase Granulation Tissue - Progress: Progressing toward goal Improve Drainage Characteristics: Min, Serous Improve Drainage Characteristics - Progress: Progressing toward goal Goals/treatment plan/discharge plan were made with and agreed upon by patient/family:  Yes Time For Goal Achievement: 7 days Wound Therapy - Potential for Goals: Good  Goals will be updated until maximal potential achieved or discharge criteria met.  Discharge criteria: when goals achieved, discharge from hospital, MD decision/surgical intervention, no progress towards goals, refusal/missing three consecutive treatments without notification or medical reason.  GP     Charges PT Wound Care Charges $Wound Debridement up to 20 cm: < or equal to 20 cm $ Wound Debridement each add'l 20 sqcm: 4 $PT Hydrotherapy Visit: 1 Visit       Moishe Spice, PT, DPT Acute Rehabilitation Services  Pager: [phone] Office: [place] 04/15/2022, 4:43 PM

## 2022-04-15 NOTE — Progress Notes (Signed)
Password required per husband Gauri Galvao.  Password = 65 [address]je[address] Bravo

## 2022-04-15 NOTE — Progress Notes (Addendum)
Progress Note    04/15/2022 7:49 AM 25 Days Post-Op  Subjective:  very minimally verbal, will nod head. Denies pain   Vitals:   04/15/22 0015 04/15/22 0419  BP: 111/61 131/64  Pulse: (!) 107 (!) 109  Resp: 20 19  Temp: 98.5 F (36.9 C) 99.5 F (37.5 C)  SpO2: 96% 96%   Physical Exam: Cardiac:  tachypneic  Lungs:  non labored Incisions:  abdominal incision and b groin incisions are clean, dry and intact. Healing well Extremities:  well perfused and warm  Abdomen:  soft, non tender Neurologic: alert  CBC    Component Value Date/Time   WBC 19.6 (H) 04/15/2022 0500   RBC 3.29 (L) 04/15/2022 0500   HGB 9.3 (L) 04/15/2022 0500   HGB 14.5 03/22/2021 0916   HCT 30.4 (L) 04/15/2022 0500   HCT 43.9 03/22/2021 0916   PLT 355 04/15/2022 0500   PLT 261 03/22/2021 0916   MCV 92.4 04/15/2022 0500   MCV 90 03/22/2021 0916   MCH 28.3 04/15/2022 0500   MCHC 30.6 04/15/2022 0500   RDW 15.7 (H) 04/15/2022 0500   RDW 12.5 03/22/2021 0916   LYMPHSABS 2.8 02/03/2015 0527   MONOABS 0.4 02/03/2015 0527   EOSABS 0.1 02/03/2015 0527   BASOSABS 0.0 02/03/2015 0527    BMET    Component Value Date/Time   NA 148 (H) 04/15/2022 0500   NA 137 03/22/2021 0916   K 4.0 04/15/2022 0500   CL 117 (H) 04/15/2022 0500   CO2 25 04/15/2022 0500   GLUCOSE 223 (H) 04/15/2022 0500   BUN 43 (H) 04/15/2022 0500   BUN 11 03/22/2021 0916   CREATININE 0.84 04/15/2022 0500   CREATININE 0.74 11/11/2015 1921   CALCIUM 8.8 (L) 04/15/2022 0500   GFRNONAA >60 04/15/2022 0500   GFRNONAA >89 11/11/2015 1921   GFRAA 78 10/23/2020 1701   GFRAA >89 11/11/2015 1921    INR    Component Value Date/Time   INR 1.1 03/27/2022 0510     Intake/Output Summary (Last 24 hours) at 04/15/2022 0749 Last data filed at 04/15/2022 4010 Gross per 24 hour  Intake 90 ml  Output 1300 ml  Net -1210 ml     Assessment/Plan:  66 y.o. female is s/p aortobifemoral bypass 25 Days Post-Op   Very slow to progress BLE  well perfused and warm Incisions c/d/I healing well Abdomen soft, non distended, non tender Will d/c tpn to see if she will tolerate increased po intake H&H stable  Tachypneic and tachy overnight Appreciate ostomy care Appreciate CCM assistance Will as TRH to assist with medical management    DVT prophylaxis:  sq heparin   Karoline Caldwell, PA-C [facility] [phone] 04/15/2022 7:49 AM  VASCULAR STAFF ADDENDUM: I have independently interviewed and examined the patient. I agree with the above.  Did not receive a page regarding febrile episode yesterday - temp was axillary, oral was normal. No further episodes.  This morning denied pain. Shaking head to answer questions.  Abdomen soft, Denied chest pain.  Incisions healing well.  Labs with leukocytosis worse than previous will trend Appreciate [facility] [place] for sacral decubitus ulcer.   Plan:  Stop TPN - TF at goal Aggressive PT, OT, SLP Waxing and waning mental status still concerning.  Prior head CT negative. - Will discuss utility of MRI with hospital medicine.  Limit narcotics as much as possible.  OOB with nursing to chair.  Hold tylenol. If febrile will pursue full infectious workup including blood  cultures, CT Abdomen, pelvis, new labs  I had a long conversation with family yesterday regarding Ileene's current status. Currently weak and deconditioned.  Needs aggressive PT, OT, SLP. I asked family to help encourage her participation in these.    Cassandria Santee, MD [facility] Phone Number: [phone] 04/15/2022 8:19 AM

## 2022-04-15 NOTE — Progress Notes (Signed)
Mobility Specialist Progress Note:   04/15/22 1400  Mobility  Activity Transferred from chair to bed  Level of Assistance +2 (takes two people)  Assistive Device MaxiMove  Activity Response Tolerated well  $Mobility charge 1 Mobility   Pt received in chair needing to get back to bed. No complaints, Husband at bedside. Left with call bell in reach and all needs met.     Mobility Specialist  

## 2022-04-15 NOTE — TOC Progression Note (Signed)
Transition of Care [facility]) - Progression Note    Patient Details  Name: Gabrielle [name] MRN: [id number] Date of Birth: 09/12/1956  Transition of Care [facility]) CM/SW [place], [place] Phone Number: 04/15/2022, 11:47 AM  Clinical Narrative:     TOC is following and will assist with discharge planning once closer to being medically stable.  Expected Discharge Plan:  (TBD) Barriers to Discharge: Continued Medical Work up  Expected Discharge Plan and Services Expected Discharge Plan:  (TBD) In-house Referral: Clinical Social Work Discharge Planning Services: CM Consult Post Acute Care Choice: Long Term Acute Care (LTAC) Living arrangements for the past 2 months: Single Family Home                   DME Agency: NA       HH Arranged: NA           Social Determinants of Health (SDOH) Interventions    Readmission Risk Interventions     View : No data to display.

## 2022-04-15 NOTE — Progress Notes (Signed)
Flexiseal malfunction. Placed new.  Raelyn Number, RN

## 2022-04-16 ENCOUNTER — Inpatient Hospital Stay (HOSPITAL_COMMUNITY): Payer: BC Managed Care – PPO

## 2022-04-16 DIAGNOSIS — R6510 Systemic inflammatory response syndrome (SIRS) of non-infectious origin without acute organ dysfunction: Secondary | ICD-10-CM | POA: Diagnosis not present

## 2022-04-16 DIAGNOSIS — I739 Peripheral vascular disease, unspecified: Secondary | ICD-10-CM | POA: Diagnosis not present

## 2022-04-16 DIAGNOSIS — L89159 Pressure ulcer of sacral region, unspecified stage: Secondary | ICD-10-CM | POA: Diagnosis not present

## 2022-04-16 DIAGNOSIS — I5021 Acute systolic (congestive) heart failure: Secondary | ICD-10-CM | POA: Diagnosis not present

## 2022-04-16 DIAGNOSIS — R410 Disorientation, unspecified: Secondary | ICD-10-CM | POA: Diagnosis not present

## 2022-04-16 DIAGNOSIS — E1165 Type 2 diabetes mellitus with hyperglycemia: Secondary | ICD-10-CM

## 2022-04-16 DIAGNOSIS — L89150 Pressure ulcer of sacral region, unstageable: Secondary | ICD-10-CM

## 2022-04-16 LAB — GLUCOSE, CAPILLARY
Glucose-Capillary: 100 mg/dL — ABNORMAL HIGH (ref 70–99)
Glucose-Capillary: 123 mg/dL — ABNORMAL HIGH (ref 70–99)
Glucose-Capillary: 145 mg/dL — ABNORMAL HIGH (ref 70–99)
Glucose-Capillary: 150 mg/dL — ABNORMAL HIGH (ref 70–99)
Glucose-Capillary: 257 mg/dL — ABNORMAL HIGH (ref 70–99)
Glucose-Capillary: 94 mg/dL (ref 70–99)

## 2022-04-16 LAB — URINALYSIS, ROUTINE W REFLEX MICROSCOPIC
Bilirubin Urine: NEGATIVE
Bilirubin Urine: NEGATIVE
Glucose, UA: 50 mg/dL — AB
Glucose, UA: NEGATIVE mg/dL
Hgb urine dipstick: NEGATIVE
Hgb urine dipstick: NEGATIVE
Ketones, ur: NEGATIVE mg/dL
Ketones, ur: NEGATIVE mg/dL
Nitrite: POSITIVE — AB
Nitrite: POSITIVE — AB
Protein, ur: NEGATIVE mg/dL
Protein, ur: NEGATIVE mg/dL
Specific Gravity, Urine: 1.017 (ref 1.005–1.030)
Specific Gravity, Urine: 1.018 (ref 1.005–1.030)
pH: 5 (ref 5.0–8.0)
pH: 5 (ref 5.0–8.0)

## 2022-04-16 LAB — BASIC METABOLIC PANEL
Anion gap: 5 (ref 5–15)
BUN: 35 mg/dL — ABNORMAL HIGH (ref 8–23)
CO2: 25 mmol/L (ref 22–32)
Calcium: 8.5 mg/dL — ABNORMAL LOW (ref 8.9–10.3)
Chloride: 114 mmol/L — ABNORMAL HIGH (ref 98–111)
Creatinine, Ser: 0.75 mg/dL (ref 0.44–1.00)
GFR, Estimated: >60 mL/min (ref >60)
Glucose, Bld: 123 mg/dL — ABNORMAL HIGH (ref 70–99)
Potassium: 3.9 mmol/L (ref 3.5–5.1)
Sodium: 144 mmol/L (ref 135–145)

## 2022-04-16 LAB — MAGNESIUM: Magnesium: 2 mg/dL (ref 1.7–2.4)

## 2022-04-16 LAB — CBC
HCT: 29 % — ABNORMAL LOW (ref 36.0–46.0)
Hemoglobin: 9.1 g/dL — ABNORMAL LOW (ref 12.0–15.0)
MCH: 28.8 pg (ref 26.0–34.0)
MCHC: 31.4 g/dL (ref 30.0–36.0)
MCV: 91.8 fL (ref 80.0–100.0)
Platelets: 331 10*3/uL (ref 150–400)
RBC: 3.16 MIL/uL — ABNORMAL LOW (ref 3.87–5.11)
RDW: 15.5 % (ref 11.5–15.5)
WBC: 20.8 10*3/uL — ABNORMAL HIGH (ref 4.0–10.5)
nRBC: 0 % (ref 0.0–0.2)

## 2022-04-16 LAB — APTT: aPTT: 28 seconds (ref 24–36)

## 2022-04-16 MED ORDER — PIPERACILLIN-TAZOBACTAM 3.375 G IVPB
3.3750 g | Freq: Three times a day (TID) | INTRAVENOUS | Status: DC
  Administered 2022-04-16 – 2022-04-18 (×5): 3.375 g via INTRAVENOUS
  Filled 2022-04-16 (×5): qty 50

## 2022-04-16 NOTE — Progress Notes (Signed)
Sacral dressing change completed per MD order without difficulty.

## 2022-04-16 NOTE — Progress Notes (Addendum)
Mobility Specialist Progress Note   04/16/22 1200  Mobility  Activity Transferred from chair to bed  Level of Assistance +2 (takes two people)  Assistive Device MaxiMove  Activity Response Tolerated well  $Mobility charge 1 Mobility   Pt received in chair ready to get back to bed. Pt left in bed with call bell in reach and family members present.   [place]alling Mobility Specialist Phone Number [phone]

## 2022-04-16 NOTE — Assessment & Plan Note (Addendum)
Initial echocardiogram from 05.10 with reduced LV systolic function to 20 to 25%, follow up on 05/21 with improved LV systolic function to 55 to 60% with preserved RV systolic function and no significant valvular disease.   Plan to continue blood pressure monitoring.  Continue with entresto and carvedilol.   Patient has postoperative cardiogenic shock that has resolved. She was treated with vasopressin and norepinephrine.  Acute hypoxemic respiratory failure related to shock, now resolved.

## 2022-04-16 NOTE — Assessment & Plan Note (Signed)
Continue with nutritional supplements.  

## 2022-04-16 NOTE — Assessment & Plan Note (Addendum)
Acute metabolic encephalopathy resolved.   Patient is awake and alert, responds to simple questions. Not very communicative today.   Plan to continue with quetiapine.  Neuro checks per unit protocol.

## 2022-04-16 NOTE — Assessment & Plan Note (Addendum)
Patient had persistent leukocytosis.  Further work up with Ct abdomen and pelvis noted large retroperitoneal hematoma. 11, 11.2, 15,4 cm  Placed on IV Zosyn for antibiotic therapy.  Blood cultures have been no growth.  Wbc has been trending down, today is 9.4  Patient will complete antibiotic therapy on 05/02/22.  Plan to repeat CT abdomen and pelvis CT next week to evaluate improvement in size.

## 2022-04-16 NOTE — Assessment & Plan Note (Addendum)
Aortoiliac occlusive disease.   05/08 sp aortobifemoral bypass, bilateral femoral endarterectomy.  Left sided profunda endarterectomy with bovine pericardial patch.   Continue with aspirin and blood pressure control.  Pain control with tramadol.   Dyslipidemia, continue with statin therapy.

## 2022-04-16 NOTE — Assessment & Plan Note (Addendum)
Deep tissue pressure ulcer not able to stage.  Not present on admission.  Abdominal wound has been healing with no signs of local infection.

## 2022-04-16 NOTE — Assessment & Plan Note (Addendum)
Patient was placed on insulin therapy with sliding scale for glucose cover and monitoring.  Basal insulin and pre-meal dosing.

## 2022-04-16 NOTE — Progress Notes (Signed)
Mobility Specialist Progress Note:   04/16/22 1056  Mobility  Activity Transferred from bed to chair  Level of Assistance +2 (takes two people)  Assistive Device MaxiMove  Activity Response Tolerated well  $Mobility charge 1 Mobility   Pt received in bed willing to sit in chair. Pt left in chair with call bell in reach and husband present.   [facility] Nehemie Casserly Mobility Specialist

## 2022-04-16 NOTE — Progress Notes (Incomplete)
Pharmacy Antibiotic Note  Gabrielle [name] is a 66 y.o. female admitted on 03/21/2022 with  leukocytosis .  Pharmacy has been consulted for Zosyn dosing.  Patient is s/p aortobifemoral bypass. Vascular surgery placed a consult to pharmacy to hold antibiotics until sputum and urine cultures collected. Urine culture collected, no sputum culture ordered.   Plan: Zosyn 3.375g IV q8h (4 hour infusion). Trend WBC, temp, renal function  F/U infectious work-up  Height: '4\' 9"'$  (144.8 cm) Weight: 58.1 kg (128 lb 1.4 oz) IBW/kg (Calculated) : 38.6  Temp (24hrs), Avg:99.2 F (37.3 C), Min:98.3 F (36.8 C), Max:100.2 F (37.9 C)  Recent Labs  Lab 04/11/22 0345 04/12/22 0322 04/13/22 0440 04/14/22 0546 04/15/22 0500 04/16/22 0441  WBC 16.8*  --  15.8* 15.6* 19.6* 20.8*  CREATININE 0.87 0.76 0.74 0.81 0.84 0.75    Estimated Creatinine Clearance: 51.4 mL/min (by C-G formula based on SCr of 0.75 mg/dL).    No Known Allergies  Antimicrobials this admission: Cefazolin 5/8 > 5/9 Zosyn 5/10 > 5/14   Microbiology results: 6/2 BCx: NGTD 6/3 UCx: IP   Thank you for allowing pharmacy to be a part of this patient's care.  Joseph Art, Pharm.D. PGY-1 Pharmacy Resident [phone] 04/16/2022 2:11 PM

## 2022-04-16 NOTE — Progress Notes (Signed)
Progress Note   Patient: Gabrielle [name] DVV:[id number] DOB: 1956/09/20 DOA: 03/21/2022     26 DOS: the patient was seen and examined on 04/16/2022   Brief hospital course: Reason for consultation medical management of leukocytosis, metabolic encephalopathy, hypernatremia and T2DM.   66 yo female with the past medical history of peripheral vascular disease, hypertension, dyslipidemia, T2DM, and rectal cancer who presented for aortobifemoral bypass due to critical limb ischemia on 03/21/22. Post operative course has been complicated by shock, in the setting of stress induced cardiomyopathy. Patient required invasive mechanical ventilation 03/23/22 and successfully liberated from mechanical ventilation on 04/02/22.  She developed ileus, requiring TPN.  Currently ileus has resolved and speech therapy has recommended dysphagia 1 diet.   Positive change in mentation, work up with head CT was negative for ischemic changes.   Patient with persistent leukocytosis since 05/09, more elevation for the last 3 days, up to 20,8.     Assessment and Plan: * PAD (peripheral artery disease) ([place]) Aortoiliac occlusive disease.   05/08 sp aortobifemoral bypass, bilateral femoral endarterectomy.  Left sided profunda endarterectomy with bovine pericardial patch.   Continue with aspirin and blood pressure control.  Pain control with tramadol.   Dyslipidemia, continue with statin therapy.     SIRS (systemic inflammatory response syndrome) (HCC) Patient with persistent leukocytosis, more over the last 3 days.  Blood cultures and urine culture with no growth.  Urine analysis with no pyuria.  T max 37,9 C  Chest radiograph personally reviewed noted right basal atelectasis, no infiltrates.   Continue close monitoring on cell count and temperature curve.  Agree with abdomen and pelvis CT for further work up.      Acute systolic heart failure (HCC) Initial echocardiogram from 05.10 with reduced LV  systolic function to 20 to 25%, follow up on 05/21 with improved LV systolic function to 55 to 60% with preserved RV systolic function and no significant valvular disease.   Plan to continue blood pressure monitoring.  Continue with entresto and carvedilol.  Clinically heart failure is compensated today.   Delirium Acute metabolic encephalopathy.  Patient is awake and alert, responds to simple questions. Not very communicative today.   Plan to continue with quetiapine.  Neuro checks per unit protocol.   Protein-calorie malnutrition, severe Continue with nutritional supplements.   Type 2 diabetes mellitus (HCC) Fasting glucose this am is 123.   Plan to continue insulin therapy with sliding scale for glucose cover and monitoring.  Basal insulin and pre-meal dosing.   Pressure ulcer, sacrum Deep tissue pressure ulcer not able to stage.  Not present on admission.,          Subjective: Patient with no chest pain or dyspnea, poor communicative   Physical Exam: Vitals:   04/16/22 0436 04/16/22 0600 04/16/22 0752 04/16/22 1057  BP: 115/61 112/63 128/65 (!) 77/51  Pulse: (!) 109 (!) 103 (!) 105 (!) 105  Resp: '20 20 18 19  '$ Temp:   98.4 F (36.9 C) 99 F (37.2 C)  TempSrc:   Oral Oral  SpO2: 98% 98% 98% 97%  Weight:      Height:       Neurology awake and alert, responds to simple questions and follows simple commands. ENT with mild pallor Cardiovascular with S1 and S2 present and rhythmic Respiratory with no rales or rhonchi Abdomen distended and tympanic, not tender, no rebound Lower extremity with no edema  Data Reviewed:    Family Communication: I spoke with patient's family at  the bedside, we talked in detail about patient's condition, plan of care and prognosis and all questions were addressed.   Disposition: Status is: Inpatient Remains inpatient appropriate because: workup for leukocytosis   Planned Discharge Destination:  to be determined      Author: Tawni Millers, MD 04/16/2022 3:27 PM  For on call review [email or web address].

## 2022-04-16 NOTE — Progress Notes (Signed)
VASCULAR SURGERY ASSESSMENT & PLAN:   S/P AORTOBIFEMORAL BYPASS GRAFT: Her incisions are healing nicely and her feet are warm and well-perfused with good Doppler signals.  GI NUTRITION: She is tolerating her tube feeds.  We will eventually transition her to nighttime feeds.  ID: Her white blood cell count remains elevated at 20.8.  Tmax is 100.2.  She had a chest x-ray today which showed volume loss in the right hemothorax with patchy opacity at the right lung base likely atelectasis.  She had mild left infrahilar atelectasis.  Her urine has many bacteria.  Urine is positive for night trait.  Blood cultures drawn yesterday are negative so far.  I have ordered a urine culture and also respiratory therapy to work on pulmonary toilet.  Of also ordered a sputum culture.  Once her cultures are obtained we will start her on Zosyn pending these results.  If she does not show significant improvement with respect to her white blood cell count and fever we will get a CT of the abdomen and pelvis.  DVT PROPHYLAXIS: She is on subcu heparin.  PHYSICAL THERAPY: Continue physical therapy.   SUBJECTIVE:   No change in her mental status.  She is alert but does not really spondee to questions.  PHYSICAL EXAM:   Vitals:   04/15/22 2009 04/16/22 0016 04/16/22 0436 04/16/22 0600  BP: 123/68 (!) 127/57 115/61 112/63  Pulse: (!) 113 (!) 112 (!) 109 (!) 103  Resp: '17 15 20 20  '$ Temp: 100.2 F (37.9 C) 99.9 F (37.7 C)    TempSrc: Oral Oral    SpO2: 96% 96% 98% 98%  Weight:      Height:       Lungs are clear. Abdomen has normal pitched bowel sounds.  She is slightly distended. All of his incisions look fine. Her feet are warm and well-perfused with good Doppler signals bilaterally.  LABS:   Lab Results  Component Value Date   WBC 20.8 (H) 04/16/2022   HGB 9.1 (L) 04/16/2022   HCT 29.0 (L) 04/16/2022   MCV 91.8 04/16/2022   PLT 331 04/16/2022   Lab Results  Component Value Date   CREATININE  0.75 04/16/2022   Lab Results  Component Value Date   INR 1.1 03/27/2022   CBG (last 3)  Recent Labs    04/15/22 2014 04/16/22 0014 04/16/22 0437  GLUCAP 168* 150* 123*    PROBLEM LIST:    Principal Problem:   PAD (peripheral artery disease) (HCC) Active Problems:   Aortoiliac occlusive disease (HCC)   Protein-calorie malnutrition, severe   Acute systolic heart failure (HCC)   Delirium   SIRS (systemic inflammatory response syndrome) (HCC)   CURRENT MEDS:    aspirin  81 mg Per Tube Daily   bacitracin   Topical TID   carvedilol  12.5 mg Per Tube BID WC   Chlorhexidine Gluconate Cloth  6 each Topical Daily   feeding supplement  237 mL Oral TID BM   free water  100 mL Per Tube Q6H   heparin injection (subcutaneous)  5,000 Units Subcutaneous Q8H   insulin aspart  0-20 Units Subcutaneous TID WC   insulin aspart  3 Units Subcutaneous TID WC   insulin detemir  18 Units Subcutaneous BID   leptospermum manuka honey  1 application. Topical Daily   mouth rinse  15 mL Mouth Rinse BID   nutrition supplement (JUVEN)  1 packet Per Tube BID BM   pantoprazole sodium  40 mg Per  Tube Daily   QUEtiapine  25 mg Per Tube QHS   rosuvastatin  40 mg Per Tube Daily   sacubitril-valsartan  1 tablet Per Tube BID   sodium chloride flush  10-40 mL Intracatheter [place] Office: [phone] 04/16/2022

## 2022-04-16 NOTE — Progress Notes (Signed)
On-call physician notified of red mews, consulted with physician; patient will be closely monitored with Q1 vitals.No new orders at this time.

## 2022-04-16 NOTE — Hospital Course (Addendum)
Reason for consultation medical management of leukocytosis, metabolic encephalopathy, hypernatremia and T2DM.   66 yo female with the past medical history of peripheral vascular disease, hypertension, dyslipidemia, T2DM, and rectal cancer who presented for aortobifemoral bypass due to critical limb ischemia on 03/21/22. Post operative course has been complicated by shock, in the setting of stress induced cardiomyopathy. Patient required invasive mechanical ventilation 03/23/22 and successfully liberated from mechanical ventilation on 04/02/22.  She developed ileus, requiring TPN.  Currently ileus has resolved and speech therapy has recommended dysphagia 1 diet.   Positive change in mentation, work up with head CT was negative for ischemic changes.   Patient with persistent leukocytosis since 05/09.   06/03 bacteriuria, (E. Coli) treated with antibiotic therapy.   06/06 CT abdomen and pelvis showed large retroperitoneal hematoma. Patient was placed on antibiotic therapy considering infected hematoma.   06/11 urinary incontinence affecting sacral wound, foley cathter was placed.   Patient with very poor oral intake. Positive dysphagia. Patient has been on tube feedings.  May need PEG tube if not adequate po intake.   Positive sacral pressure ulcer stage IV.   Plan to transfer to long term acute care.

## 2022-04-17 ENCOUNTER — Other Ambulatory Visit: Payer: Self-pay

## 2022-04-17 DIAGNOSIS — I739 Peripheral vascular disease, unspecified: Secondary | ICD-10-CM | POA: Diagnosis not present

## 2022-04-17 LAB — CBC WITH DIFFERENTIAL/PLATELET
Abs Immature Granulocytes: 0.1 10*3/uL — ABNORMAL HIGH (ref 0.00–0.07)
Basophils Absolute: 0 10*3/uL (ref 0.0–0.1)
Basophils Relative: 0 %
Eosinophils Absolute: 0.1 10*3/uL (ref 0.0–0.5)
Eosinophils Relative: 1 %
HCT: 28.3 % — ABNORMAL LOW (ref 36.0–46.0)
Hemoglobin: 9 g/dL — ABNORMAL LOW (ref 12.0–15.0)
Immature Granulocytes: 1 %
Lymphocytes Relative: 21 %
Lymphs Abs: 3.4 10*3/uL (ref 0.7–4.0)
MCH: 28.5 pg (ref 26.0–34.0)
MCHC: 31.8 g/dL (ref 30.0–36.0)
MCV: 89.6 fL (ref 80.0–100.0)
Monocytes Absolute: 0.6 10*3/uL (ref 0.1–1.0)
Monocytes Relative: 4 %
Neutro Abs: 11.9 10*3/uL — ABNORMAL HIGH (ref 1.7–7.7)
Neutrophils Relative %: 73 %
Platelets: 315 10*3/uL (ref 150–400)
RBC: 3.16 MIL/uL — ABNORMAL LOW (ref 3.87–5.11)
RDW: 15 % (ref 11.5–15.5)
WBC: 16.2 10*3/uL — ABNORMAL HIGH (ref 4.0–10.5)
nRBC: 0 % (ref 0.0–0.2)

## 2022-04-17 LAB — GLUCOSE, CAPILLARY
Glucose-Capillary: 183 mg/dL — ABNORMAL HIGH (ref 70–99)
Glucose-Capillary: 150 mg/dL — ABNORMAL HIGH (ref 70–99)
Glucose-Capillary: 118 mg/dL — ABNORMAL HIGH (ref 70–99)
Glucose-Capillary: 91 mg/dL (ref 70–99)
Glucose-Capillary: 259 mg/dL — ABNORMAL HIGH (ref 70–99)
Glucose-Capillary: 230 mg/dL — ABNORMAL HIGH (ref 70–99)

## 2022-04-17 LAB — BASIC METABOLIC PANEL
Anion gap: 4 — ABNORMAL LOW (ref 5–15)
BUN: 26 mg/dL — ABNORMAL HIGH (ref 8–23)
CO2: 25 mmol/L (ref 22–32)
Calcium: 8.3 mg/dL — ABNORMAL LOW (ref 8.9–10.3)
Chloride: 107 mmol/L (ref 98–111)
Creatinine, Ser: 0.68 mg/dL (ref 0.44–1.00)
GFR, Estimated: >60 mL/min (ref >60)
Glucose, Bld: 157 mg/dL — ABNORMAL HIGH (ref 70–99)
Potassium: 3.5 mmol/L (ref 3.5–5.1)
Sodium: 136 mmol/L (ref 135–145)

## 2022-04-17 LAB — APTT: aPTT: 32 seconds (ref 24–36)

## 2022-04-17 MED ORDER — VITAL 1.5 CAL PO LIQD
1000.0000 mL | ORAL | Status: DC
  Administered 2022-04-17 – 2022-04-20 (×4): 1000 mL
  Filled 2022-04-17 (×4): qty 1000

## 2022-04-17 MED ORDER — POTASSIUM CHLORIDE 20 MEQ PO PACK
40.0000 meq | PACK | Freq: Once | ORAL | Status: AC
  Administered 2022-04-17: 40 meq via ORAL
  Filled 2022-04-17: qty 2

## 2022-04-17 MED ORDER — POTASSIUM CHLORIDE ER 10 MEQ PO TBCR: 20.0000 meq | EXTENDED_RELEASE_TABLET | Freq: Once | ORAL | Status: DC

## 2022-04-17 MED ORDER — PROSOURCE TF PO LIQD
90.0000 mL | Freq: Two times a day (BID) | ORAL | Status: DC
  Administered 2022-04-17 – 2022-04-21 (×9): 90 mL
  Filled 2022-04-17 (×9): qty 90

## 2022-04-17 MED ORDER — FUROSEMIDE 10 MG/ML IJ SOLN
20.0000 mg | Freq: Once | INTRAMUSCULAR | Status: AC
  Administered 2022-04-17: 20 mg via INTRAVENOUS
  Filled 2022-04-17: qty 2

## 2022-04-17 NOTE — Progress Notes (Signed)
PROGRESS NOTE  SHARLENE MCCLUSKEY  DOB: 1956/04/16  PCP: Dorna Mai, MD NOM:[id number]  DOA: 03/21/2022  LOS: 81 days  Hospital Day: 28  Brief narrative: Gabrielle [name] is a 66 y.o. female with PMH significant for DM2, HTN, HLD, PAD rectal cancer. 5/8, patient was admitted under vascular surgery on for critical limb ischemia and underwent emergency aortobifemoral bypass Postoperatively she was monitored in ICU. 5/10 - 5/20, intubated and mechanically ventilated. Course was complicated by shock, stress-induced cardiomyopathy, persistent leukocytosis, paralytic ileus requiring TPN. Ileus subsequently resolved and started on oral diet. 6/2, transferred out off ICU.  [facility] service consulted for continued medical management.  Subjective: Patient was seen and examined this morning.  Alert, awake, very slow and weak.  Has core track feeding on.  Family at bedside. Chart reviewed Last 24 hours, patient remains tachycardic up to 110.  Blood pressure running as low as 80s, breathing on room air Last set of labs from this morning showed WBC count at 16.2, hemoglobin at 9, BUN/creatinine 26/0.68, good blood glucose level this morning 118 Urinalysis from 6/3 with cloudy yellow urine with large leukocytes, positive nitrite and many bacteria  Principal Problem:   PAD (peripheral artery disease) (HCC) Active Problems:   SIRS (systemic inflammatory response syndrome) (HCC)   Acute systolic heart failure (HCC)   Delirium   Protein-calorie malnutrition, severe   Type 2 diabetes mellitus (HCC)   Pressure ulcer, sacrum    Assessment and plan: Postoperative cardiogenic shock Acute systolic heart failure -Patient's postop course was complicated by cardiogenic shock in the setting of stress-induced cardiomyopathy as evidenced by sequential echocardiogram 5/9 EF 55 to 60% 5/10 EF 20 to 25%.  5/21 EF to 55 to 60% -Seen by critical care, cardiology.  Postoperative shock was treated with pressures,  hydration and subsequent diuresis.  Currently volume status stable.  Ischemia evaluation was not required. -Currently on Coreg 12.5 mg twice daily, Entresto 49-51 mg twice daily.  Blood pressure is constantly low.  I am not sure if patient needs standard CHF regimen when her EF has already improved.  I would hold Entresto at this time.  Continue Coreg because of tachycardia.  Will discuss with cardiology tomorrow.  Critical limb ischemia  PAD/HLD -Status post aortobifemoral bypass 5/8 -Continue aspirin, statin   UTI -On 6/3, patient was noted to have uptrending leukocytosis.  Patient had a temperature up to 100.3. -Urinalysis showed cloudy urine, many leukocytes, positive nitrite.   -Blood culture and urine cultures obtained. -Patient is currently on IV Zosyn -Continue to monitor. Recent Labs  Lab 04/13/22 0440 04/14/22 0546 04/15/22 0500 04/16/22 0441 04/17/22 0500  WBC 15.8* 15.6* 19.6* 20.8* 96.2*   Acute metabolic encephalopathy -Likely from combination of shock, prolonged ICU stay, UTI.   -Currently on Seroquel 25 units at bedtime.  Continue to monitor mental status changes  -Tramadol for pain.  Neurontin on hold  Uncontrolled type 2 diabetes mellitus with hyperglycemia -A1c elevated to 9.2 on 5/1 -Home meds include semaglutide, Januvia, metformin, Farxiga -Currently on Levemir 18 units twice daily, NovoLog 3 units 3 times daily pre-meal, sliding scale insulin with Accu-Cheks Recent Labs  Lab 04/16/22 2026 04/17/22 0057 04/17/22 0457 04/17/22 0743 04/17/22 1105  GLUCAP 257* 183* 150* 118* 91   Protein-calorie malnutrition, severe -RD consult obtained.  Continue with nutritional supplements.   Unstageable sacral ulcer -Not present on admission.  Wound care consult appreciated  Impaired mobility  -PT eval obtained.  SNF recommended  Goals of care  Code Status: Full Code    Mobility: Needs aggressive physical therapy  Skin assessment:  Pressure Injury  03/30/22 Sacrum Posterior;Right;Left Deep Tissue Pressure Injury - Purple or maroon localized area of discolored intact skin or blood-filled blister due to damage of underlying soft tissue from pressure and/or shear. large deep tissue inj (Active)  03/30/22 0800  Location: Sacrum  Location Orientation: Posterior;Right;Left  Staging: Deep Tissue Pressure Injury - Purple or maroon localized area of discolored intact skin or blood-filled blister due to damage of underlying soft tissue from pressure and/or shear.  Wound Description (Comments): large deep tissue injury with skin sloughing off 10x13  Present on Admission: No    Nutritional status:  Body mass index is 27.72 kg/m.  Nutrition Problem: Severe Malnutrition Etiology: acute illness Signs/Symptoms: mild fat depletion, moderate muscle depletion, energy intake < or equal to 50% for > or equal to 5 days     Diet:  Diet Order             DIET - DYS 1 Room service appropriate? Yes with Assist; Fluid consistency: Thin  Diet effective now                   DVT prophylaxis:  heparin injection 5,000 Units Start: 03/24/22 0845 SCD's Start: 03/21/22 1854   Antimicrobials: IV Zosyn Fluid: None Consultants: As above Family Communication: Family at bedside  Status is: Inpatient  Continue in-hospital care because: Profoundly weak, multiple medical issues Level of care: Progressive   Dispo: The patient is from: Home              Anticipated d/c is to: SNF versus LTAC              Patient currently is not medically stable to d/c.   Difficult to place patient No     Infusions:   sodium chloride Stopped (04/09/22 0351)   magnesium sulfate bolus IVPB     piperacillin-tazobactam (ZOSYN)  IV Stopped (04/17/22 0947)    Scheduled Meds:  aspirin  81 mg Per Tube Daily   bacitracin   Topical TID   carvedilol  12.5 mg Per Tube BID WC   Chlorhexidine Gluconate Cloth  6 each Topical Daily   feeding supplement  237 mL Oral TID BM    free water  100 mL Per Tube Q6H   heparin injection (subcutaneous)  5,000 Units Subcutaneous Q8H   insulin aspart  0-20 Units Subcutaneous TID WC   insulin aspart  3 Units Subcutaneous TID WC   insulin detemir  18 Units Subcutaneous BID   leptospermum manuka honey  1 application. Topical Daily   mouth rinse  15 mL Mouth Rinse BID   nutrition supplement (JUVEN)  1 packet Per Tube BID BM   pantoprazole sodium  40 mg Per Tube Daily   QUEtiapine  25 mg Per Tube QHS   rosuvastatin  40 mg Per Tube Daily   sodium chloride flush  10-40 mL Intracatheter Q12H    PRN meds: sodium chloride, bisacodyl, guaiFENesin-dextromethorphan, hydrALAZINE, labetalol, lip balm, magnesium sulfate bolus IVPB, ondansetron, oxyCODONE, phenol, silver nitrate applicators, sodium chloride flush, traMADol   Antimicrobials: Anti-infectives (From admission, onward)    Start     Dose/Rate Route Frequency Ordered Stop   04/16/22 2000  piperacillin-tazobactam (ZOSYN) IVPB 3.375 g        3.375 g 12.5 mL/hr over 240 Minutes Intravenous Every 8 hours 04/16/22 1901     03/23/22 0830  piperacillin-tazobactam (ZOSYN) IVPB 3.375 g  3.375 g 12.5 mL/hr over 240 Minutes Intravenous Every 8 hours 03/23/22 0736 03/28/22 0121   03/21/22 1945  ceFAZolin (ANCEF) IVPB 2g/100 mL premix        2 g 200 mL/hr over 30 Minutes Intravenous Every 8 hours 03/21/22 1853 03/22/22 1144   03/21/22 0539  ceFAZolin (ANCEF) IVPB 2g/100 mL premix        2 g 200 mL/hr over 30 Minutes Intravenous 30 min pre-op 03/21/22 0539 03/21/22 0803       Objective: Vitals:   04/17/22 1100 04/17/22 1200  BP: (!) 118/57 (!) 96/47  Pulse: 98 (!) 105  Resp: (!) 25 (!) 8  Temp: 97.6 F (36.4 C)   SpO2: 100% 97%    Intake/Output Summary (Last 24 hours) at 04/17/2022 1310 Last data filed at 04/17/2022 1200 Gross per 24 hour  Intake 6724.32 ml  Output 1500 ml  Net 5224.32 ml   Filed Weights   04/11/22 0200 04/12/22 0500 04/13/22 0500  Weight:  58.9 kg 58.6 kg 58.1 kg   Weight change:  Body mass index is 27.72 kg/m.   Physical Exam: General exam: Elderly thin built African-American female.  Lethargic Skin: No rashes, lesions or ulcers. HEENT: Atraumatic, normocephalic, no obvious bleeding.  Core track feeding tube in place Lungs: Clear to auscultate bilaterally.  Shallow respiratory effort CVS: Tachycardic, no murmur  GI/Abd soft, mild distention noted, nontender, bowel sound present CNS: Alert, awake, tries to move extremities on command but very weak Psychiatry: Sad affect Extremities: No pedal edema, no calf tenderness.  Data Review: I have personally reviewed the laboratory data and studies available.  F/u labs ordered Unresulted Labs (From admission, onward)     Start     Ordered   04/18/22 0500  CBC with Differential/Platelet  Daily,   R     Question:  Specimen collection method  Answer:  Unit=Unit collect   04/17/22 1310   04/18/22 7673  Basic metabolic panel  Daily,   R     Question:  Specimen collection method  Answer:  Unit=Unit collect   04/17/22 1310   04/17/22 1311  Magnesium  Add-on,   R       Question:  Specimen collection method  Answer:  Unit=Unit collect   04/17/22 1310   04/17/22 1311  Potassium  Add-on,   R       Question:  Specimen collection method  Answer:  Unit=Unit collect   04/17/22 1310   04/16/22 1458  Expectorated Sputum Assessment w Gram Stain, Rflx to Resp Cult  ONCE - URGENT,   URGENT        04/16/22 1502   03/26/22 0500  APTT  Daily,   R     Question:  Specimen collection method  Answer:  Unit=Unit collect   03/25/22 1230            Signed, Terrilee Croak, MD [facility] 04/17/2022

## 2022-04-17 NOTE — Progress Notes (Signed)
VASCULAR SURGERY ASSESSMENT & PLAN:   S/P AORTOBIFEMORAL BYPASS GRAFT: Both feet are warm and well-perfused.  All of her incisions are healing nicely.  GI NUTRITION: Speech pathology is following.  She is currently on a dysphagia 1 diet during the day and making some progress.  They will be reevaluating her early this coming week.  In the meantime, we will start nighttime tube feeds to improve her nutritional status.     ID: Day 1 of Zosyn pending culture results this was started late yesterday.  Her white count is down from 20.8 to 16.2.  She is afebrile.  Her Tmax is 99.4.  Urine culture is pending.  They have had a difficult time getting a sputum culture.  Her blood cultures have been negative for 2 days.  RENAL: Renal function remains normal.  She had been getting some extra fluid and has some lower extremity swelling so I written for 20 mg of Lasix IV today.  She has been fluid positive for the last 2 days.  Potassium is 3.5.  I will write for some potassium supplements.  DVT PROPHYLAXIS: She is on subcu heparin.   PHYSICAL THERAPY: Continue physical therapy.  I updated the family at the bedside this morning.  Appreciate the Hospitalists for assisting with her care.  SUBJECTIVE:   More alert this morning.  No specific complaints.  She thinks she is moving her bowels.  PHYSICAL EXAM:   Vitals:   04/17/22 0735 04/17/22 0740 04/17/22 0800 04/17/22 0900  BP: (!) 82/61 111/62 (!) 85/64 (!) 88/64  Pulse: 99 (!) 101 100 97  Resp: (!) 28 (!) '21 15 16  '$ Temp:  98.7 F (37.1 C)    TempSrc:  Oral    SpO2: 97% 99% 99% 99%  Weight:      Height:       Abdomen slightly distended but has normal pitched bowel sounds. Her lungs are clear this morning. Both feet are warm and well-perfused. All of her incisions look fine.  LABS:   Lab Results  Component Value Date   WBC 16.2 (H) 04/17/2022   HGB 9.0 (L) 04/17/2022   HCT 28.3 (L) 04/17/2022   MCV 89.6 04/17/2022   PLT 315  04/17/2022   Lab Results  Component Value Date   CREATININE 0.68 04/17/2022   Lab Results  Component Value Date   INR 1.1 03/27/2022   CBG (last 3)  Recent Labs    04/17/22 0057 04/17/22 0457 04/17/22 0743  GLUCAP 183* 150* 118*    PROBLEM LIST:    Principal Problem:   PAD (peripheral artery disease) (HCC) Active Problems:   Type 2 diabetes mellitus (HCC)   Protein-calorie malnutrition, severe   Acute systolic heart failure (HCC)   Delirium   SIRS (systemic inflammatory response syndrome) (HCC)   Pressure ulcer, sacrum   CURRENT MEDS:    aspirin  81 mg Per Tube Daily   bacitracin   Topical TID   carvedilol  12.5 mg Per Tube BID WC   Chlorhexidine Gluconate Cloth  6 each Topical Daily   feeding supplement  237 mL Oral TID BM   free water  100 mL Per Tube Q6H   heparin injection (subcutaneous)  5,000 Units Subcutaneous Q8H   insulin aspart  0-20 Units Subcutaneous TID WC   insulin aspart  3 Units Subcutaneous TID WC   insulin detemir  18 Units Subcutaneous BID   leptospermum manuka honey  1 application. Topical Daily   mouth rinse  15 mL Mouth Rinse BID   nutrition supplement (JUVEN)  1 packet Per Tube BID BM   pantoprazole sodium  40 mg Per Tube Daily   potassium chloride  40 mEq Oral Once   QUEtiapine  25 mg Per Tube QHS   rosuvastatin  40 mg Per Tube Daily   sacubitril-valsartan  1 tablet Per Tube BID   sodium chloride flush  10-40 mL Intracatheter [place] Office: [phone] 04/17/2022

## 2022-04-17 NOTE — Progress Notes (Signed)
Nutrition Follow-up  DOCUMENTATION CODES:   Severe malnutrition in context of acute illness/injury  INTERVENTION:   Change to nocturnal tube feeding via Cortrak:  Vital 1.5 at 70 ml/hr x 14 hours (980 ml total) Pro-Source TF 90 mL BID Provides 1630 kcals (85% of estimated needs), 110 g of protein (100% of minimum estimated needs), 749 mL of free water.  Free water of 100 mL q 6 hours with TF provides 1148 mL of free water.  Continue Juven BID per tube, each packet provides 80 calories, 8 grams of carbohydrate, 2.5  grams of protein (collagen), 7 grams of L-arginine and 7 grams of L-glutamine; supplement contains CaHMB, Vitamins C, E, B12 and Zinc to promote wound healing.  Continue Magic cup TID with meals, each supplement provides 290 kcal and 9 grams of protein.  Continue Ensure Plus High Protein po TID, each supplement provides 350 kcal and 20 grams of protein.  Feeding assistance at meal times and with supplements.  NUTRITION DIAGNOSIS:   Severe Malnutrition related to acute illness as evidenced by mild fat depletion, moderate muscle depletion, energy intake < or equal to 50% for > or equal to 5 days.  Ongoing   GOAL:   Patient will meet greater than or equal to 90% of their needs  Met with TF  MONITOR:   Vent status, Weight trends, Labs, TF tolerance  REASON FOR ASSESSMENT:   Consult, Ventilator Enteral/tube feeding initiation and management  ASSESSMENT:   66 yo female admitted for aortobifemoral bypass due to right-sided Rutherford 4 critical limb ischemia, left-sided Rutherford 3 critical limb ischemia.  Pt developed postop shock and respiratory failure requiring intubation.  PMH includes DM, HTN, HLD, severe PAD  5/08 Aortobifemoral bypass using 14 x 7 bifurcated Dacron graft, Bilateral femoral endarterectomy,  Left-sided profunda endarterectomy with bovine pericardial patch 5/09 PCCM consult for hypotension 5/10 Worsening respiratory status requiring  intubation 5/11 Trickle TF initiated 5/12 Initiated TF titration 5/13 TF decreased back to trickle TF given abd distention: abd xray with dilated SB 5/17 TF continued at trickle TF rate until pt experienced large volume emesis with additional NG suction 1.5 L out, TPN recommended, Cortrak placed-gastric, unable to get PP 5/18 TPN initiated 5/20 Extubated  5/23 Abd xray with decreasing distention in SB and colon, Cortrak with tip in duodenum 5/25 XR shows cortrak tube in stomach 5/27 TPN discontinued 5/28 EN decreased to 35 ml/hr (half rate) due to abd pain 5/29 SLP advanced Diet to Dysphagia 1, Nectar Thick  6/01 SLP advanced diet to dysphagia 1 with thin liquids  Patient is on a dysphagia 1 diet with thin liquids. Meal intakes: 25% x 2 meals 6/3 and breakfast today. She is also being offered Ensure supplements TID between meals.   Received consult to transition to nocturnal tube feedings. Currently receiving Vital 1.5 at 55 ml/h continuous x 24 hours via Cortrak with Prosource TF 45 ml BID. Free water flushes 100 ml every 6 hours. Receiving Juven BID via tube.  Hydrotherapy continues to sacral wound.  Labs reviewed:  CBG: 118-91 Medications reviewed and include Novolog, Levemir, Juven, Protonix.  Diet Order:   Diet Order             DIET - DYS 1 Room service appropriate? Yes with Assist; Fluid consistency: Thin  Diet effective now                   EDUCATION NEEDS:   Not appropriate for education at this time  Skin:  Skin Assessment: Skin Integrity Issues: Skin Integrity Issues:: Unstageable DTI: perineum Unstageable: sacrococcygeal: seen by WOC  Last BM:  6/4 rectal tube  Height:   Ht Readings from Last 1 Encounters:  03/24/22 4' 9"  (1.448 m)    Weight:   Wt Readings from Last 1 Encounters:  04/13/22 58.1 kg    BMI:  Body mass index is 27.72 kg/m.  Estimated Nutritional Needs:   Kcal:  1900-2100 kcals  Protein:  110-130 g  Fluid:  >/= 1.9  L   Lucas Mallow RD, LDN, CNSC Please refer to Amion for contact information.

## 2022-04-18 DIAGNOSIS — I739 Peripheral vascular disease, unspecified: Secondary | ICD-10-CM | POA: Diagnosis not present

## 2022-04-18 LAB — BASIC METABOLIC PANEL
Anion gap: 7 (ref 5–15)
BUN: 32 mg/dL — ABNORMAL HIGH (ref 8–23)
CO2: 22 mmol/L (ref 22–32)
Calcium: 8.1 mg/dL — ABNORMAL LOW (ref 8.9–10.3)
Chloride: 109 mmol/L (ref 98–111)
Creatinine, Ser: 0.81 mg/dL (ref 0.44–1.00)
GFR, Estimated: >60 mL/min (ref >60)
Glucose, Bld: 425 mg/dL — ABNORMAL HIGH (ref 70–99)
Potassium: 4.1 mmol/L (ref 3.5–5.1)
Sodium: 138 mmol/L (ref 135–145)

## 2022-04-18 LAB — GLUCOSE, CAPILLARY
Glucose-Capillary: 129 mg/dL — ABNORMAL HIGH (ref 70–99)
Glucose-Capillary: 157 mg/dL — ABNORMAL HIGH (ref 70–99)
Glucose-Capillary: 311 mg/dL — ABNORMAL HIGH (ref 70–99)
Glucose-Capillary: 316 mg/dL — ABNORMAL HIGH (ref 70–99)
Glucose-Capillary: 392 mg/dL — ABNORMAL HIGH (ref 70–99)
Glucose-Capillary: 394 mg/dL — ABNORMAL HIGH (ref 70–99)
Glucose-Capillary: 415 mg/dL — ABNORMAL HIGH (ref 70–99)

## 2022-04-18 LAB — CBC WITH DIFFERENTIAL/PLATELET
Abs Immature Granulocytes: 0.07 10*3/uL (ref 0.00–0.07)
Basophils Absolute: 0 10*3/uL (ref 0.0–0.1)
Basophils Relative: 0 %
Eosinophils Absolute: 0 10*3/uL (ref 0.0–0.5)
Eosinophils Relative: 0 %
HCT: 28.9 % — ABNORMAL LOW (ref 36.0–46.0)
Hemoglobin: 9.2 g/dL — ABNORMAL LOW (ref 12.0–15.0)
Immature Granulocytes: 1 %
Lymphocytes Relative: 19 %
Lymphs Abs: 2.5 10*3/uL (ref 0.7–4.0)
MCH: 28.5 pg (ref 26.0–34.0)
MCHC: 31.8 g/dL (ref 30.0–36.0)
MCV: 89.5 fL (ref 80.0–100.0)
Monocytes Absolute: 0.6 10*3/uL (ref 0.1–1.0)
Monocytes Relative: 4 %
Neutro Abs: 10.4 10*3/uL — ABNORMAL HIGH (ref 1.7–7.7)
Neutrophils Relative %: 76 %
Platelets: 296 10*3/uL (ref 150–400)
RBC: 3.23 MIL/uL — ABNORMAL LOW (ref 3.87–5.11)
RDW: 14.8 % (ref 11.5–15.5)
WBC: 13.6 10*3/uL — ABNORMAL HIGH (ref 4.0–10.5)
nRBC: 0 % (ref 0.0–0.2)

## 2022-04-18 LAB — URINE CULTURE: Culture: >=100000 — AB

## 2022-04-18 LAB — APTT: aPTT: 30 seconds (ref 24–36)

## 2022-04-18 MED ORDER — INSULIN DETEMIR 100 UNIT/ML ~~LOC~~ SOLN
25.0000 [IU] | Freq: Two times a day (BID) | SUBCUTANEOUS | Status: DC
  Administered 2022-04-18 – 2022-04-28 (×22): 25 [IU] via SUBCUTANEOUS
  Filled 2022-04-18 (×24): qty 0.25

## 2022-04-18 MED ORDER — INSULIN ASPART 100 UNIT/ML IJ SOLN
10.0000 [IU] | Freq: Once | INTRAMUSCULAR | Status: AC
Start: 2022-04-18 — End: 2022-04-18
  Administered 2022-04-18: 10 [IU] via SUBCUTANEOUS

## 2022-04-18 MED ORDER — INSULIN ASPART 100 UNIT/ML IJ SOLN: 0.0000 [IU] | Freq: Every day | INTRAMUSCULAR | Status: DC

## 2022-04-18 MED ORDER — INSULIN ASPART 100 UNIT/ML IJ SOLN
15.0000 [IU] | Freq: Once | INTRAMUSCULAR | Status: AC
  Administered 2022-04-18: 15 [IU] via SUBCUTANEOUS

## 2022-04-18 MED ORDER — CEPHALEXIN 500 MG PO CAPS
500.0000 mg | ORAL_CAPSULE | Freq: Two times a day (BID) | ORAL | Status: DC
  Administered 2022-04-18 – 2022-04-21 (×7): 500 mg
  Filled 2022-04-18 (×7): qty 1

## 2022-04-18 MED ORDER — INSULIN ASPART 100 UNIT/ML IJ SOLN
0.0000 [IU] | Freq: Three times a day (TID) | INTRAMUSCULAR | Status: DC
  Administered 2022-04-18: 3 [IU] via SUBCUTANEOUS

## 2022-04-18 MED ORDER — SEMAGLUTIDE 14 MG PO TABS: 14.0000 mg | ORAL_TABLET | Freq: Every day | ORAL | Status: DC

## 2022-04-18 MED ORDER — METFORMIN HCL 500 MG PO TABS
1000.0000 mg | ORAL_TABLET | Freq: Two times a day (BID) | ORAL | Status: DC
  Administered 2022-04-18 – 2022-04-20 (×4): 1000 mg via ORAL
  Filled 2022-04-18 (×4): qty 2

## 2022-04-18 NOTE — Progress Notes (Addendum)
  Progress Note    04/18/2022 7:26 AM 28 Days Post-Op  Subjective:  no complaints. Not very verbal this morning   Vitals:   04/18/22 0100 04/18/22 0300  BP: 124/62 105/64  Pulse: (!) 125 (!) 115  Resp: (!) 31 (!) 27  Temp:  100.1 F (37.8 C)  SpO2: 99% 97%   Physical Exam: Cardiac:  sinus tach Lungs:  non labored Incisions:  abdominal incision and b groin incisions essentially healed Extremities:  well perfused and warm, edematous  Abdomen:  abdomen remains slightly distended, non tender Neurologic: alert and oriented  CBC    Component Value Date/Time   WBC 13.6 (H) 04/18/2022 0323   RBC 3.23 (L) 04/18/2022 0323   HGB 9.2 (L) 04/18/2022 0323   HGB 14.5 03/22/2021 0916   HCT 28.9 (L) 04/18/2022 0323   HCT 43.9 03/22/2021 0916   PLT 296 04/18/2022 0323   PLT 261 03/22/2021 0916   MCV 89.5 04/18/2022 0323   MCV 90 03/22/2021 0916   MCH 28.5 04/18/2022 0323   MCHC 31.8 04/18/2022 0323   RDW 14.8 04/18/2022 0323   RDW 12.5 03/22/2021 0916   LYMPHSABS 2.5 04/18/2022 0323   MONOABS 0.6 04/18/2022 0323   EOSABS 0.0 04/18/2022 0323   BASOSABS 0.0 04/18/2022 0323    BMET    Component Value Date/Time   NA 138 04/18/2022 0323   NA 137 03/22/2021 0916   K 4.1 04/18/2022 0323   CL 109 04/18/2022 0323   CO2 22 04/18/2022 0323   GLUCOSE 425 (H) 04/18/2022 0323   BUN 32 (H) 04/18/2022 0323   BUN 11 03/22/2021 0916   CREATININE 0.81 04/18/2022 0323   CREATININE 0.74 11/11/2015 1921   CALCIUM 8.1 (L) 04/18/2022 0323   GFRNONAA >60 04/18/2022 0323   GFRNONAA >89 11/11/2015 1921   GFRAA 78 10/23/2020 1701   GFRAA >89 11/11/2015 1921    INR    Component Value Date/Time   INR 1.1 03/27/2022 0510     Intake/Output Summary (Last 24 hours) at 04/18/2022 0726 Last data filed at 04/18/2022 0600 Gross per 24 hour  Intake 4981.69 ml  Output 1300 ml  Net 3681.69 ml     Assessment/Plan:  66 y.o. female is s/p aortobifemoral bypass 28 Days Post-Op   Bilateral lower  extremities remain well perfused and warm Abdominal incision and B groin incisions essentially healed Remains on Zosyn. WBC trending down 13.6 this morning. Urine culture with e. Coli Blood culture negative Remains on dysphagia 1 diet during day. Night time tube feeds continued Appreciate wound care for sacral ulcer Continue therapies Appreciate TRH assistance   Marval Regal [facility] [phone] 04/18/2022 7:26 AM  VASCULAR STAFF ADDENDUM: I have independently interviewed and examined the patient. I agree with the above.  Cherisse is improving slowly I am happy to see her leukocytosis resolving-UTI being treated She continues to need supplemental feeding.  I discussed PEG tube with her and her husband. I also had a frank discussion about her participation in therapy sessions.  We will likely pursue PEG by the end of the week, will give her a few more days of p.o. trial Appreciate the assistance of Dr. Pietro Cassis Appreciate [facility], Physical, occupation, and Speech therapies Sacral wound will take significant time   J. Melene Muller, MD [facility] Phone Number: [phone] 04/18/2022 6:00 PM

## 2022-04-18 NOTE — Progress Notes (Signed)
PROGRESS NOTE  Gabrielle [name]  DOB: 08/01/1956  PCP: Dorna Mai, MD ONG:[id number]  DOA: 03/21/2022  LOS: 8 days  Hospital Day: 29  Brief narrative: Gabrielle [name] is a 66 y.o. female with PMH significant for DM2, HTN, HLD, PAD rectal cancer. 5/8, patient was admitted under vascular surgery on for critical limb ischemia and underwent emergency aortobifemoral bypass Postoperatively she was monitored in ICU. 5/10 - 5/20, intubated and mechanically ventilated. Course was complicated by shock, stress-induced cardiomyopathy, persistent leukocytosis, paralytic ileus requiring TPN. Ileus subsequently resolved and started on oral diet. 6/2, transferred out off ICU.  [facility] consulted for continued medical management.  Subjective: Patient was seen and examined this morning.   Propped up in bed.  Awake, alert, oriented to place only.  Moving her right arm better today.  Husband states he is less weak today.  He is trying to feed her.  Poor appetite.  Currently on dysphagia diet as well as nightly tube feeding. Blood sugar level significantly elevated this morning  Principal Problem:   PAD (peripheral artery disease) (HCC) Active Problems:   SIRS (systemic inflammatory response syndrome) (HCC)   Acute systolic heart failure (HCC)   Delirium   Protein-calorie malnutrition, severe   Type 2 diabetes mellitus (HCC)   Pressure ulcer, sacrum    Assessment and plan: Postoperative cardiogenic shock Acute systolic heart failure -Patient's postop course was complicated by cardiogenic shock in the setting of stress-induced cardiomyopathy as evidenced by sequential echocardiogram 5/9 EF 55 to 60% 5/10 EF 20 to 25%.  5/21 EF to 55 to 60% -Seen by critical care, cardiology.  Postoperative shock was treated with pressors, hydration and subsequent diuresis.  Currently volume status stable.  Ischemia evaluation was not required. -Currently on Coreg 12.5 mg twice daily.  Entresto stopped.   Tachycardic this morning.  If blood pressure allows, I would go up on the dose of Coreg.  Critical limb ischemia  PAD/HLD -Status post aortobifemoral bypass 5/8 -Continue aspirin, statin   UTI -On 6/3, patient was noted to have uptrending leukocytosis.  Patient had a temperature up to 100.3. -Urinalysis showed cloudy urine, many leukocytes, positive nitrite.   -Blood culture and urine cultures obtained. -Patient is currently on IV Zosyn -Continue to monitor. Recent Labs  Lab 04/14/22 0546 04/15/22 0500 04/16/22 0441 04/17/22 0500 04/18/22 0323  WBC 15.6* 19.6* 20.8* 16.2* 44.0*   Acute metabolic encephalopathy -Likely from combination of shock, prolonged ICU stay, UTI.   -Currently on Seroquel 25 units at bedtime.  Continue to monitor mental status changes  -Tramadol for pain. Neurontin on hold  Uncontrolled type 2 diabetes mellitus with hyperglycemia -A1c elevated to 9.2 on 5/1 -Home meds include semaglutide, Januvia, metformin, Farxiga -Currently on Levemir 18 units twice daily, NovoLog 3 units 3 times daily pre-meal, sliding scale insulin with Accu-Cheks.  Blood sugar level significantly elevated today.  Resume semaglutide, metformin.  Sliding scale adjusted Recent Labs  Lab 04/17/22 2019 04/18/22 0009 04/18/22 0317 04/18/22 0836 04/18/22 0837  GLUCAP 230* 311* 392* 415* 394*   Protein-calorie malnutrition, severe -RD consult obtained.  Continue with nutritional supplements.   Unstageable sacral ulcer -Not present on admission.  Wound care consult appreciated  Impaired mobility  -PT eval obtained.  SNF recommended  Goals of care   Code Status: Full Code    Mobility: Needs aggressive physical therapy  Skin assessment:  Pressure Injury 03/30/22 Sacrum Posterior;Right;Left Deep Tissue Pressure Injury - Purple or maroon localized area of discolored intact skin or  blood-filled blister due to damage of underlying soft tissue from pressure and/or shear. large deep  tissue inj (Active)  03/30/22 0800  Location: Sacrum  Location Orientation: Posterior;Right;Left  Staging: Deep Tissue Pressure Injury - Purple or maroon localized area of discolored intact skin or blood-filled blister due to damage of underlying soft tissue from pressure and/or shear.  Wound Description (Comments): large deep tissue injury with skin sloughing off 10x13  Present on Admission: No    Nutritional status:  Body mass index is 29.32 kg/m.  Nutrition Problem: Severe Malnutrition Etiology: acute illness Signs/Symptoms: mild fat depletion, moderate muscle depletion, energy intake < or equal to 50% for > or equal to 5 days     Diet:  Diet Order             DIET - DYS 1 Room service appropriate? Yes with Assist; Fluid consistency: Thin  Diet effective now                   DVT prophylaxis:  heparin injection 5,000 Units Start: 03/24/22 0845 SCD's Start: 03/21/22 1854   Antimicrobials: IV Zosyn Fluid: None Consultants: As above Family Communication: Family at bedside  Status is: Inpatient  Continue in-hospital care because: Profoundly weak, multiple medical issues Level of care: Progressive   Dispo: The patient is from: Home              Anticipated d/c is to: SNF versus LTAC              Patient currently is not medically stable to d/c.   Difficult to place patient No     Infusions:   sodium chloride Stopped (04/09/22 0351)   magnesium sulfate bolus IVPB      Scheduled Meds:  aspirin  81 mg Per Tube Daily   bacitracin   Topical TID   carvedilol  12.5 mg Per Tube BID WC   cephALEXin  500 mg Per Tube Q12H   Chlorhexidine Gluconate Cloth  6 each Topical Daily   feeding supplement  237 mL Oral TID BM   feeding supplement (PROSource TF)  90 mL Per Tube BID   feeding supplement (VITAL 1.5 CAL)  1,000 mL Per Tube Q24H   free water  100 mL Per Tube Q6H   heparin injection (subcutaneous)  5,000 Units Subcutaneous Q8H   insulin aspart  0-20 Units  Subcutaneous TID WC   insulin aspart  0-5 Units Subcutaneous QHS   insulin aspart  3 Units Subcutaneous TID WC   insulin detemir  25 Units Subcutaneous BID   leptospermum manuka honey  1 application. Topical Daily   mouth rinse  15 mL Mouth Rinse BID   metFORMIN  1,000 mg Oral BID WC   nutrition supplement (JUVEN)  1 packet Per Tube BID BM   pantoprazole sodium  40 mg Per Tube Daily   QUEtiapine  25 mg Per Tube QHS   rosuvastatin  40 mg Per Tube Daily   Semaglutide  14 mg Oral Daily   sodium chloride flush  10-40 mL Intracatheter Q12H    PRN meds: sodium chloride, bisacodyl, guaiFENesin-dextromethorphan, hydrALAZINE, labetalol, lip balm, magnesium sulfate bolus IVPB, ondansetron, oxyCODONE, phenol, silver nitrate applicators, sodium chloride flush, traMADol   Antimicrobials: Anti-infectives (From admission, onward)    Start     Dose/Rate Route Frequency Ordered Stop   04/18/22 1115  cephALEXin (KEFLEX) capsule 500 mg        500 mg Per Tube Every 12 hours 04/18/22 1023  04/16/22 2000  piperacillin-tazobactam (ZOSYN) IVPB 3.375 g  Status:  Discontinued        3.375 g 12.5 mL/hr over 240 Minutes Intravenous Every 8 hours 04/16/22 1901 04/18/22 1023   03/23/22 0830  piperacillin-tazobactam (ZOSYN) IVPB 3.375 g        3.375 g 12.5 mL/hr over 240 Minutes Intravenous Every 8 hours 03/23/22 0736 03/28/22 0121   03/21/22 1945  ceFAZolin (ANCEF) IVPB 2g/100 mL premix        2 g 200 mL/hr over 30 Minutes Intravenous Every 8 hours 03/21/22 1853 03/22/22 1144   03/21/22 0539  ceFAZolin (ANCEF) IVPB 2g/100 mL premix        2 g 200 mL/hr over 30 Minutes Intravenous 30 min pre-op 03/21/22 0539 03/21/22 0803       Objective: Vitals:   04/18/22 0300 04/18/22 0900  BP: 105/64 124/75  Pulse: (!) 115 (!) 120  Resp: (!) 27 17  Temp: 100.1 F (37.8 C) 98.7 F (37.1 C)  SpO2: 97% 97%    Intake/Output Summary (Last 24 hours) at 04/18/2022 1144 Last data filed at 04/18/2022 1142 Gross per  24 hour  Intake 1960.08 ml  Output 1950 ml  Net 10.08 ml   Filed Weights   04/12/22 0500 04/13/22 0500 04/18/22 0500  Weight: 58.6 kg 58.1 kg 61.5 kg   Weight change:  Body mass index is 29.32 kg/m.   Physical Exam: General exam: Elderly thin built African-American female.  Lethargic Skin: No rashes, lesions or ulcers. HEENT: Atraumatic, normocephalic, no obvious bleeding.  Core track feeding tube in place Lungs: Clear to auscultation bilaterally CVS: Tachycardic, no murmur  GI/Abd soft, mild distention noted, nontender, bowel sound present CNS: More awake.  Oriented to place.  Able to move right upper extremity for me.   Psychiatry: Sad affect Extremities: No pedal edema, no calf tenderness.  Data Review: I have personally reviewed the laboratory data and studies available.  F/u labs ordered Unresulted Labs (From admission, onward)     Start     Ordered   04/18/22 0500  CBC with Differential/Platelet  Daily,   R     Question:  Specimen collection method  Answer:  Unit=Unit collect   04/17/22 1310   04/18/22 2703  Basic metabolic panel  Daily,   R     Question:  Specimen collection method  Answer:  Unit=Unit collect   04/17/22 1310   04/16/22 1458  Expectorated Sputum Assessment w Gram Stain, Rflx to Resp Cult  ONCE - URGENT,   URGENT        04/16/22 1502   03/26/22 0500  APTT  Daily,   R     Question:  Specimen collection method  Answer:  Unit=Unit collect   03/25/22 1230            Signed, Terrilee Croak, MD [facility] 04/18/2022

## 2022-04-18 NOTE — Progress Notes (Signed)
Physical Therapy Treatment Patient Details Name: Gabrielle [name] MRN: [id number] DOB: [birth year] Today's Date: 04/18/2022   History of Present Illness Pt is a 66 y.o. female admitted 03/21/22 for open aortobifemoral bypass and bilateral femoral endarterectomy, complicated by MI, HF, shock. ETT 5/10-5/20. Pt with waxing/waning mentation; head CT 5/26 negative for acute abnormality. Pt also with sacral wound and hydrotherapy following since 04/04/22. PMH includes rectal CA, DM2, HTN.    PT Comments    Pt received in supine, drowsy and remaining lethargic throughout session. Pt needing totalA +2 for rolling and bed mobility. Pt with poor command following for supine/seated LE exercises and needing max to totalA for seated balance with tendency to R lean likely due to sacral discomfort. BP stable and HR tachy but stable with supine/seated tasks, SpO2 WFL. Pt not following commands well enough to attempt a standing trial this date and defer OOB to chair transfer for safety due to pt lethargy, RN notified. Plan to see pt prior to hydrotherapy or on non-hydro day next session to see if this helps improve her command following/alertness. Pt continues to benefit from PT services to progress toward functional mobility goals.   Recommendations for follow up therapy are one component of a multi-disciplinary discharge planning process, led by the attending physician.  Recommendations may be updated based on patient status, additional functional criteria and insurance authorization.  Follow Up Recommendations  Skilled nursing-short term rehab (<3 hours/day)     Assistance Recommended at Discharge Frequent or constant Supervision/Assistance  Patient can return home with the following Two people to help with walking and/or transfers;Two people to help with bathing/dressing/bathroom;Assistance with cooking/housework;Assist for transportation;Direct supervision/assist for financial management;Help with stairs or ramp  for entrance;Direct supervision/assist for medications management   Equipment Recommendations  Wheelchair (measurements PT);Wheelchair cushion (measurements PT);Hospital bed;Other (comment) (hoyer lift; petite size for WC as pt is 71f 9")    Recommendations for Other Services       Precautions / Restrictions Precautions Precautions: Fall;Other (comment) Precaution Comments: cortrak (PM feeds only), flexiseal, sacral wound; RUE restricted/no BP;  L brachial BP painful try wrist Restrictions Weight Bearing Restrictions: No     Mobility  Bed Mobility Overal bed mobility: Needs Assistance Bed Mobility: Rolling, Sidelying to Sit, Sit to Sidelying Rolling: Total assist, +2 for physical assistance Sidelying to sit: Total assist, +2 for physical assistance, HOB elevated     Sit to sidelying: +2 for physical assistance, Total assist General bed mobility comments: totalA to roll toward L then R sides for changing of pad and pt not following commands due to AMS; totalA +2 for log roll to/from EOB as pt not following instructions but once sitting upright pt assisting to maintain balance with BUE (using RUE more than LUE)    Transfers Overall transfer level: Needs assistance                 General transfer comment: unsafe to assess 2/2 pt lethargy and poor command following      Balance Overall balance assessment: Needs assistance Sitting-balance support: Bilateral upper extremity supported Sitting balance-Leahy Scale: Poor Sitting balance - Comments: leaning frequently to L side, like to offload painful sacrum. Pt needing variable modA to tRodessafor seated balance due to impulsivity to return to sidelying posture from pain vs weakness. Postural control: Right lateral lean     Standing balance comment: UTA as pt too lethargic to attempt safely           Cognition Arousal/Alertness: Lethargic Behavior  During Therapy: Flat affect, Anxious Overall Cognitive Status: Difficult  to assess Area of Impairment: Attention, Following commands, Safety/judgement, Problem solving                 Orientation Level:  (pt not verbally responding to any questions so UTA) Current Attention Level: Focused   Following Commands: Follows one step commands inconsistently Safety/Judgement: Decreased awareness of safety, Decreased awareness of deficits Awareness: Intellectual Problem Solving: Slow processing, Decreased initiation, Difficulty sequencing, Requires verbal cues, Requires tactile cues General Comments: Pt lethargic in supine and tending to maintain eyes closed, not following any simple commands or speaking; once assisted to sit EOB with x2 therapist supporting, pt with eyes open but following <10% of simple commands, pt still non-verbal but indicating no x2 when prompted by shaking her head. Assume pt fatigued from earlier hydrotherapy session as per PT she was more verbally responsive at that time.        Exercises Other Exercises Other Exercises: seated BLE PROM: ankle pumps, LAQ (x2 reps AROM on R otherwise PROM), hip flexion (some AA) x10 reps ea Other Exercises: static sitting with emphasis on midline posture, pt using RUE more than LUE to assist x10 mins    General Comments General comments (skin integrity, edema, etc.): BP 110/72 (83) seated on LUE; BP 119/62 (80) sidelying in supine in L wrist; SpO2 100% on RA and HR 113-115 bpm resting and with seated activity      Pertinent Vitals/Pain Pain Assessment Pain Assessment: Faces Faces Pain Scale: Hurts even more Pain Location: pt does not specify - suspect sacral wound and also L brachial area with BP assessment (smaller cuff brought and pt not grimacing when L wrist BP checked Pain Descriptors / Indicators: Restless, Grimacing           PT Goals (current goals can now be found in the care plan section) Acute Rehab PT Goals Patient Stated Goal: per previous session to get stronger; pt not verbalizing  goal today PT Goal Formulation: With family Time For Goal Achievement: 04/28/22 Progress towards PT goals: Progressing toward goals (slow progress, cognition/pain limiting)    Frequency    Min 2X/week      PT Plan Current plan remains appropriate    Co-evaluation PT/OT/SLP Co-Evaluation/Treatment: Yes Reason for Co-Treatment: Complexity of the patient's impairments (multi-system involvement);Necessary to address cognition/behavior during functional activity;For patient/therapist safety PT goals addressed during session: Mobility/safety with mobility;Balance;Strengthening/ROM        AM-PAC PT "6 Clicks" Mobility   Outcome Measure  Help needed turning from your back to your side while in a flat bed without using bedrails?: Total Help needed moving from lying on your back to sitting on the side of a flat bed without using bedrails?: Total Help needed moving to and from a bed to a chair (including a wheelchair)?: Total Help needed standing up from a chair using your arms (e.g., wheelchair or bedside chair)?: Total Help needed to walk in hospital room?: Total Help needed climbing 3-5 steps with a railing? : Total 6 Click Score: 6    End of Session   Activity Tolerance: Patient limited by lethargy;Patient limited by pain Patient left: in bed;with call bell/phone within reach;with bed alarm set;Other (comment) (sidelying to R side in air bed, pillow bwn knees) Nurse Communication: Mobility status;Need for lift equipment;Other (comment) (lethargic, painful L upper arm) PT Visit Diagnosis: Muscle weakness (generalized) (M62.81);Other abnormalities of gait and mobility (R26.89);Pain     Time: [id number] PT Time Calculation (min) (  ACUTE ONLY): 35 min  Charges:  $Therapeutic Activity: 8-22 mins                     Dartanion Teo P., PTA Acute Rehabilitation Services Secure Chat Preferred 9a-5:30pm Office: [place] 04/18/2022, 1:27 PM

## 2022-04-18 NOTE — Progress Notes (Signed)
SLP Cancellation Note  Patient Details Name: Gabrielle [name] MRN: [id number] DOB: [birth year]   Cancelled treatment:        Atempted to see pt x2.  Pt doing hydrotherapy first attempt, and with PT/OT 2nd attempt.  Per RN, pt persists with minimal solid intake.  Came in briefly with PT/OT.  Pt with eye closed with therapy manipulating joints, and not very engaged/participative.  Pt likely tired from hydrotherapy.  Given lethargy will defer PO trials and possible diet upgrade to later date.    Celedonio Savage, [place], [place] Office: [phone] 04/18/2022, 12:29 PM

## 2022-04-18 NOTE — Progress Notes (Signed)
Physical Therapy Wound Treatment Patient Details  Name: Gabrielle [name] MRN: [id number] Date of Birth: 09/23/1956  Today's Date: 04/18/2022 Time: 1015-1113 Time Calculation (min): 58 min  Subjective  Subjective Assessment Subjective: Pt reporting her R foot was hurting, RN aware and checked foot. Patient and Family Stated Goals: not stated, but agreeable to session Date of Onset:  (unknown) Prior Treatments: dressing changes  Pain Score:  Pre-medicated, fair pain tolerance  Wound Assessment  Pressure Injury 03/30/22 Sacrum Posterior;Right;Left Deep Tissue Pressure Injury - Purple or maroon localized area of discolored intact skin or blood-filled blister due to damage of underlying soft tissue from pressure and/or shear. large deep tissue inj (Active)  Wound Image   04/18/22 1226  Dressing Type Foam - Lift dressing to assess site every shift;Barrier Film (skin prep);Gauze (Comment);Other (Comment) 04/18/22 1226  Dressing Clean, Dry, Intact 04/18/22 1226  Dressing Change Frequency Daily 04/18/22 1226  State of Healing Eschar 04/18/22 1226  Site / Wound Assessment Brown;Yellow;Pink;Pale;Black 04/18/22 1226  % Wound base Red or Granulating 20% 04/18/22 1226  % Wound base Yellow/Fibrinous Exudate 70% 04/18/22 1226  % Wound base Black/Eschar 10% 04/18/22 1226  % Wound base Other/Granulation Tissue (Comment) 0% 04/18/22 1226  Peri-wound Assessment Erythema (non-blanchable);Pink;Purple 04/18/22 1226  Wound Length (cm) 10 cm 04/18/22 1226  Wound Width (cm) 11.5 cm 04/18/22 1226  Wound Depth (cm) 1 cm 04/18/22 1226  Wound Surface Area (cm^2) 115 cm^2 04/18/22 1226  Wound Volume (cm^3) 115 cm^3 04/18/22 1226  Tunneling (cm) 0 04/18/22 1226  Undermining (cm) 0 04/18/22 1226  Margins Unattached edges (unapproximated) 04/18/22 1226  Drainage Amount Moderate 04/18/22 1226  Drainage Description Serosanguineous 04/18/22 1226  Treatment Debridement (Selective);Irrigation;Packing (Dry gauze);Other  (Comment) 04/18/22 1226   Hydrotherapy Pulsed lavage therapy - wound location: sacral Pulsed Lavage with Suction (psi): 12 psi Pulsed Lavage with Suction - Normal Saline Used: 1000 mL Pulsed Lavage Tip: Tip with splash shield Selective Debridement Selective Debridement - Location: sacral Selective Debridement - Tools Used: Forceps, Scalpel Selective Debridement - Tissue Removed: Black and yellow necrotic tissue with some necrotic adipose tissue    Wound Assessment and Plan  Wound Therapy - Assess/Plan/Recommendations Wound Therapy - Clinical Statement: Pt pre-medicated, but pt still reporting pain and only tolerating ok. Pt was able to follow directions to try to stay still and was therefore less restless, allowing for increased selective debirdement to be performed compared to last session. Pt's wound bed continues to display gradual improvement with reduction of nonviable tissue. Pt would benefit from further hydrotherapy to remove necrotic tissue and promote wound healing. Wound Therapy - Functional Problem List: functional debility after prolonged time in bed. Factors Delaying/Impairing Wound Healing: Immobility, Multiple medical problems, Vascular compromise, Diabetes Mellitus Hydrotherapy Plan: Dressing change, Debridement, Pulsatile lavage with suction, Patient/family education Wound Therapy - Frequency: 3X / week Wound Therapy - Current Recommendations: PT Wound Therapy - Follow Up Recommendations: dressing changes by RN, dressing changes by family/patient  Wound Therapy Goals- Improve the function of patient's integumentary system by progressing the wound(s) through the phases of wound healing (inflammation - proliferation - remodeling) by: Wound Therapy Goals - Improve the function of patient's integumentary system by progressing the wound(s) through the phases of wound healing by: Decrease Necrotic Tissue to: 10 percent Decrease Necrotic Tissue - Progress: Progressing toward  goal Increase Granulation Tissue to: 90% Increase Granulation Tissue - Progress: Progressing toward goal Improve Drainage Characteristics: Min, Serous Improve Drainage Characteristics - Progress: Progressing toward goal Goals/treatment plan/discharge plan were   made with and agreed upon by patient/family: Yes Time For Goal Achievement: 7 days Wound Therapy - Potential for Goals: Good  Goals will be updated until maximal potential achieved or discharge criteria met.  Discharge criteria: when goals achieved, discharge from hospital, MD decision/surgical intervention, no progress towards goals, refusal/missing three consecutive treatments without notification or medical reason.  GP     Charges PT Wound Care Charges $Wound Debridement up to 20 cm: < or equal to 20 cm $ Wound Debridement each add'l 20 sqcm: 4 $PT Hydrotherapy Visit: 1 Visit      Moishe Spice, PT, DPT Acute Rehabilitation Services  Pager: [phone] Office: [place] 04/18/2022, 12:32 PM

## 2022-04-18 NOTE — Progress Notes (Signed)
Occupational Therapy Treatment Patient Details Name: Gabrielle [name] MRN: [id number] DOB: 1956-07-14 Today's Date: 04/18/2022   History of present illness Pt is a 66 y.o. female admitted 03/21/22 for open aortobifemoral bypass and bilateral femoral endarterectomy, complicated by MI, HF, shock. ETT 5/10-5/20. Pt with waxing/waning mentation; head CT 5/26 negative for acute abnormality. Pt also with sacral wound and hydrotherapy following since 04/04/22. PMH includes rectal CA, DM2, HTN.   OT comments  Goals updated, and downgraded this session. Pt extremely lethargic, suspect tired from hydrotherapy this AM. Per PT that performed hydro she was talking in complete sentences and assisting with rolling at bed level. This was very different from session. She did not use words at all, very lethargic. She was total A for all aspects of ADL (hand over hand to wash face, would grasp toothbrush - but did not sustain oral care required total A), max A for sitting balance EOB - too lethargic to safely attempt sit<>stand even with stedy. OT will continue to follow acutely. SNF remains appropriate for post-acute plan. Next session hope for improved cognition and ability to engage in meaningful ADL to work on activity tolerance in seated and transfers.    Recommendations for follow up therapy are one component of a multi-disciplinary discharge planning process, led by the attending physician.  Recommendations may be updated based on patient status, additional functional criteria and insurance authorization.    Follow Up Recommendations  Skilled nursing-short term rehab (<3 hours/day)    Assistance Recommended at Discharge Frequent or constant Supervision/Assistance  Patient can return home with the following  Two people to help with walking and/or transfers;Two people to help with bathing/dressing/bathroom;Direct supervision/assist for medications management;Direct supervision/assist for financial management;Assist  for transportation;Help with stairs or ramp for entrance   Equipment Recommendations  Other (comment) (TBD)    Recommendations for Other Services PT consult;Speech consult    Precautions / Restrictions Precautions Precautions: Fall;Other (comment) Precaution Comments: cortrak (PM feeds only), flexiseal, sacral wound; RUE restricted/no BP;  L brachial BP painful try wrist Restrictions Weight Bearing Restrictions: No       Mobility Bed Mobility Overal bed mobility: Needs Assistance Bed Mobility: Rolling, Sidelying to Sit, Sit to Sidelying Rolling: Total assist, +2 for physical assistance Sidelying to sit: Total assist, +2 for physical assistance, HOB elevated     Sit to sidelying: +2 for physical assistance, Total assist General bed mobility comments: totalA to roll toward L then R sides for changing of pad and pt not following commands due to AMS; totalA +2 for log roll to/from EOB as pt not following instructions but once sitting upright pt assisting to maintain balance with BUE (using RUE more than LUE)    Transfers Overall transfer level: Needs assistance                 General transfer comment: unsafe to assess 2/2 pt lethargy and poor command following     Balance Overall balance assessment: Needs assistance Sitting-balance support: Bilateral upper extremity supported Sitting balance-Leahy Scale: Poor Sitting balance - Comments: leaning frequently to L side, like to offload painful sacrum. Pt needing variable modA to [place] for seated balance due to impulsivity to return to sidelying posture from pain vs weakness. Postural control: Right lateral lean     Standing balance comment: UTA as pt too lethargic to attempt safely  ADL either performed or assessed with clinical judgement   ADL Overall ADL's : Needs assistance/impaired     Grooming: Wash/dry face;Oral care;Maximal assistance;Total assistance;Sitting Grooming  Details (indicate cue type and reason): in sitting, she grasped toothbrush, brought up to mouth, and with hand over hand total A brushed mouth for approx 2 seconds, activity/strength not sustained             Lower Body Dressing: Total assistance;Bed level Lower Body Dressing Details (indicate cue type and reason): donn socks     Toileting- Clothing Manipulation and Hygiene: Total assistance;Bed level;+2 for physical assistance;+2 for safety/equipment              Extremity/Trunk Assessment Upper Extremity Assessment Upper Extremity Assessment: Generalized weakness;Difficult to assess due to impaired cognition (less participative from previous sessions) RUE Deficits / Details: moves against gravity, grasps toothbrush in hand- attempts to bring to mouth to demonstrate use - but movement/activity not sustained. grossly 4-/5 LUE Deficits / Details: grossly 3+/5; grasp 3+/5, shoulder FF lacks initiation, generally weaker and less coordintaed than Right            Vision       Perception     Praxis      Cognition Arousal/Alertness: Lethargic Behavior During Therapy: Flat affect, Anxious Overall Cognitive Status: Difficult to assess Area of Impairment: Attention, Following commands, Safety/judgement, Problem solving                 Orientation Level:  (pt not verbally responding to any questions so UTA) Current Attention Level: Focused   Following Commands: Follows one step commands inconsistently Safety/Judgement: Decreased awareness of safety, Decreased awareness of deficits Awareness: Intellectual Problem Solving: Slow processing, Decreased initiation, Difficulty sequencing, Requires verbal cues, Requires tactile cues General Comments: Pt lethargic in supine and tending to maintain eyes closed, not following any simple commands or speaking; once assisted to sit EOB with x2 therapist supporting, pt with eyes open but following <10% of simple commands, pt still  non-verbal but indicating no x2 when prompted by shaking her head. Assume pt fatigued from earlier hydrotherapy session as per PT she was more verbally responsive at that time.        Exercises      Shoulder Instructions       General Comments BP 110/72 (83) seated on LUE; BP 119/62 (80) sidelying in supine in L wrist; SpO2 100% on RA and HR 113-115 bpm resting and with seated activity    Pertinent Vitals/ Pain       Pain Assessment Pain Assessment: Faces Faces Pain Scale: Hurts even more Pain Location: pt does not specify - suspect sacral wound and also L brachial area with BP assessment (smaller cuff brought and pt not grimacing when L wrist BP checked Pain Descriptors / Indicators: Restless, Grimacing Pain Intervention(s): Monitored during session, Premedicated before session, Repositioned  Home Living                                          Prior Functioning/Environment              Frequency  Min 2X/week        Progress Toward Goals  OT Goals(current goals can now be found in the care plan section)  Progress towards OT goals: Not progressing toward goals - comment (limited by lethargy today)  Acute Rehab OT Goals Patient Stated  Goal: none stated OT Goal Formulation: Patient unable to participate in goal setting Time For Goal Achievement: 05/02/22 Potential to Achieve Goals: Fair ADL Goals Pt Will Perform Grooming: with mod assist;sitting Pt Will Perform Upper Body Dressing: with max assist;sitting Pt Will Perform Lower Body Dressing: with max assist;sitting/lateral leans Pt Will Transfer to Toilet: with mod assist;with +2 assist;bedside commode Pt Will Perform Toileting - Clothing Manipulation and hygiene: with mod assist;with 2+ total assist;sit to/from stand Pt/caregiver will Perform Home Exercise Program: Both right and left upper extremity;With minimal assist;With written HEP provided Additional ADL Goal #1: Pt will perform bed  mobility at max A level to come EOB prior to engaging in ADL  Plan Discharge plan remains appropriate    Co-evaluation    PT/OT/SLP Co-Evaluation/Treatment: Yes Reason for Co-Treatment: Complexity of the patient's impairments (multi-system involvement);Necessary to address cognition/behavior during functional activity;For patient/therapist safety;To address functional/ADL transfers PT goals addressed during session: Mobility/safety with mobility;Balance;Strengthening/ROM OT goals addressed during session: ADL's and self-care;Strengthening/ROM;Other (comment) (cognition)      AM-PAC OT "6 Clicks" Daily Activity     Outcome Measure   Help from another person eating meals?: Total Help from another person taking care of personal grooming?: Total Help from another person toileting, which includes using toliet, bedpan, or urinal?: Total Help from another person bathing (including washing, rinsing, drying)?: Total Help from another person to put on and taking off regular upper body clothing?: Total Help from another person to put on and taking off regular lower body clothing?: Total 6 Click Score: 6    End of Session    OT Visit Diagnosis: Unsteadiness on feet (R26.81);Other abnormalities of gait and mobility (R26.89);Muscle weakness (generalized) (M62.81);Other symptoms and signs involving cognitive function Pain - part of body:  (sacrum)   Activity Tolerance Patient limited by lethargy   Patient Left in bed;with call bell/phone within reach;Other (comment) (4 rails up)   Nurse Communication Mobility status;Need for lift equipment        Time: 1210-1245 OT Time Calculation (min): 35 min  Charges: OT General Charges $OT Visit: 1 Visit OT Treatments $Therapeutic Activity: 8-22 mins  Jesse Sans OTR/L Acute Rehabilitation Services Pager: [phone] Office: [place] 04/18/2022, 1:58 PM

## 2022-04-18 NOTE — Progress Notes (Signed)
HR 120's with SBP 120.Spoke with Dr.Opyd and updated. No orders for now. Will continue to monitor  0500- Novolog 10 units given sq as ordered once. BS 300's

## 2022-04-18 NOTE — Progress Notes (Signed)
Inpatient Diabetes Program Recommendations  AACE/ADA: New Consensus Statement on Inpatient Glycemic Control (2015)  Target Ranges:  Prepandial:   less than 140 mg/dL      Peak postprandial:   less than 180 mg/dL (1-2 hours)      Critically ill patients:  140 - 180 mg/dL   Lab Results  Component Value Date   GLUCAP 394 (H) 04/18/2022   HGBA1C 9.2 (H) 03/14/2022    Review of Glycemic Control  Latest Reference Range & Units 04/17/22 16:34 04/17/22 20:19 04/18/22 00:09 04/18/22 03:17 04/18/22 08:36 04/18/22 08:37  Glucose-Capillary 70 - 99 mg/dL 259 (H) 230 (H) 311 (H) 392 (H) 415 (H) 394 (H)   Inpatient Diabetes Program Recommendations:   Patient is now on continuous feedings 6 pm-8 am Noted hyperglycemia during pm hrs.  Patient is on Novolog meal coverage and correction tid when eating. Please consider: -Add Novolog 3 units q 4 hrs. 8 pm-4 am -Add Novolog correction q 4 hrs. 8 pm-4 am   Thank you, Nani Gasser. [place] Throckmorton, RN, MSN, CDE  Diabetes Coordinator Inpatient Glycemic Control Team Team Pager [phone] (8am-5pm) 04/18/2022 11:47 AM

## 2022-04-19 ENCOUNTER — Inpatient Hospital Stay (HOSPITAL_COMMUNITY): Payer: BC Managed Care – PPO

## 2022-04-19 DIAGNOSIS — I739 Peripheral vascular disease, unspecified: Secondary | ICD-10-CM | POA: Diagnosis not present

## 2022-04-19 LAB — BASIC METABOLIC PANEL
Anion gap: 7 (ref 5–15)
BUN: 28 mg/dL — ABNORMAL HIGH (ref 8–23)
CO2: 22 mmol/L (ref 22–32)
Calcium: 8.3 mg/dL — ABNORMAL LOW (ref 8.9–10.3)
Chloride: 109 mmol/L (ref 98–111)
Creatinine, Ser: 0.67 mg/dL (ref 0.44–1.00)
GFR, Estimated: >60 mL/min (ref >60)
Glucose, Bld: 357 mg/dL — ABNORMAL HIGH (ref 70–99)
Potassium: 3.8 mmol/L (ref 3.5–5.1)
Sodium: 138 mmol/L (ref 135–145)

## 2022-04-19 LAB — GLUCOSE, CAPILLARY
Glucose-Capillary: 122 mg/dL — ABNORMAL HIGH (ref 70–99)
Glucose-Capillary: 135 mg/dL — ABNORMAL HIGH (ref 70–99)
Glucose-Capillary: 142 mg/dL — ABNORMAL HIGH (ref 70–99)
Glucose-Capillary: 177 mg/dL — ABNORMAL HIGH (ref 70–99)
Glucose-Capillary: 309 mg/dL — ABNORMAL HIGH (ref 70–99)
Glucose-Capillary: 351 mg/dL — ABNORMAL HIGH (ref 70–99)
Glucose-Capillary: 396 mg/dL — ABNORMAL HIGH (ref 70–99)

## 2022-04-19 LAB — CBC WITH DIFFERENTIAL/PLATELET
Abs Immature Granulocytes: 0.09 10*3/uL — ABNORMAL HIGH (ref 0.00–0.07)
Basophils Absolute: 0 10*3/uL (ref 0.0–0.1)
Basophils Relative: 0 %
Eosinophils Absolute: 0.1 10*3/uL (ref 0.0–0.5)
Eosinophils Relative: 0 %
HCT: 29 % — ABNORMAL LOW (ref 36.0–46.0)
Hemoglobin: 9.1 g/dL — ABNORMAL LOW (ref 12.0–15.0)
Immature Granulocytes: 1 %
Lymphocytes Relative: 23 %
Lymphs Abs: 3.7 10*3/uL (ref 0.7–4.0)
MCH: 28.1 pg (ref 26.0–34.0)
MCHC: 31.4 g/dL (ref 30.0–36.0)
MCV: 89.5 fL (ref 80.0–100.0)
Monocytes Absolute: 0.6 10*3/uL (ref 0.1–1.0)
Monocytes Relative: 4 %
Neutro Abs: 11.3 10*3/uL — ABNORMAL HIGH (ref 1.7–7.7)
Neutrophils Relative %: 72 %
Platelets: 295 10*3/uL (ref 150–400)
RBC: 3.24 MIL/uL — ABNORMAL LOW (ref 3.87–5.11)
RDW: 14.9 % (ref 11.5–15.5)
WBC: 15.8 10*3/uL — ABNORMAL HIGH (ref 4.0–10.5)
nRBC: 0 % (ref 0.0–0.2)

## 2022-04-19 LAB — APTT: aPTT: 28 seconds (ref 24–36)

## 2022-04-19 MED ORDER — PNEUMOCOCCAL 20-VAL CONJ VACC 0.5 ML IM SUSY
0.5000 mL | PREFILLED_SYRINGE | INTRAMUSCULAR | Status: DC
  Filled 2022-04-19: qty 0.5

## 2022-04-19 MED ORDER — LOPERAMIDE HCL 2 MG PO CAPS
2.0000 mg | ORAL_CAPSULE | Freq: Two times a day (BID) | ORAL | Status: AC | PRN
  Filled 2022-04-19 (×2): qty 1

## 2022-04-19 MED ORDER — LINAGLIPTIN 5 MG PO TABS
5.0000 mg | ORAL_TABLET | Freq: Every day | ORAL | Status: DC
  Administered 2022-04-19 – 2022-04-20 (×2): 5 mg via ORAL
  Filled 2022-04-19 (×2): qty 1

## 2022-04-19 MED ORDER — CARVEDILOL 25 MG PO TABS
25.0000 mg | ORAL_TABLET | Freq: Two times a day (BID) | ORAL | Status: DC
Start: 2022-04-19 — End: 2022-05-01
  Administered 2022-04-19 – 2022-05-01 (×24): 25 mg
  Filled 2022-04-19 (×24): qty 1

## 2022-04-19 MED ORDER — INSULIN ASPART 100 UNIT/ML IJ SOLN
0.0000 [IU] | INTRAMUSCULAR | Status: DC
  Administered 2022-04-19: 15 [IU] via SUBCUTANEOUS
  Administered 2022-04-19: 2 [IU] via SUBCUTANEOUS
  Administered 2022-04-19: 15 [IU] via SUBCUTANEOUS
  Administered 2022-04-19 – 2022-04-20 (×2): 2 [IU] via SUBCUTANEOUS
  Administered 2022-04-20: 3 [IU] via SUBCUTANEOUS
  Administered 2022-04-20: 5 [IU] via SUBCUTANEOUS
  Administered 2022-04-20: 11 [IU] via SUBCUTANEOUS
  Administered 2022-04-20 (×2): 3 [IU] via SUBCUTANEOUS
  Administered 2022-04-21: 5 [IU] via SUBCUTANEOUS
  Administered 2022-04-21: 3 [IU] via SUBCUTANEOUS
  Administered 2022-04-21 (×2): 11 [IU] via SUBCUTANEOUS
  Administered 2022-04-21: 5 [IU] via SUBCUTANEOUS
  Administered 2022-04-22: 2 [IU] via SUBCUTANEOUS
  Administered 2022-04-22: 3 [IU] via SUBCUTANEOUS
  Administered 2022-04-22: 2 [IU] via SUBCUTANEOUS
  Administered 2022-04-22: 3 [IU] via SUBCUTANEOUS
  Administered 2022-04-23: 5 [IU] via SUBCUTANEOUS
  Administered 2022-04-23 (×2): 3 [IU] via SUBCUTANEOUS
  Administered 2022-04-23: 2 [IU] via SUBCUTANEOUS
  Administered 2022-04-24: 3 [IU] via SUBCUTANEOUS
  Administered 2022-04-24 (×2): 2 [IU] via SUBCUTANEOUS
  Administered 2022-04-24 (×2): 5 [IU] via SUBCUTANEOUS
  Administered 2022-04-24: 2 [IU] via SUBCUTANEOUS
  Administered 2022-04-25 (×2): 3 [IU] via SUBCUTANEOUS
  Administered 2022-04-25: 2 [IU] via SUBCUTANEOUS
  Administered 2022-04-25: 3 [IU] via SUBCUTANEOUS
  Administered 2022-04-25 – 2022-04-26 (×4): 2 [IU] via SUBCUTANEOUS
  Administered 2022-04-26 (×2): 3 [IU] via SUBCUTANEOUS
  Administered 2022-04-27 (×3): 2 [IU] via SUBCUTANEOUS
  Administered 2022-04-28 – 2022-04-29 (×4): 3 [IU] via SUBCUTANEOUS
  Administered 2022-04-29: 2 [IU] via SUBCUTANEOUS
  Administered 2022-04-29: 5 [IU] via SUBCUTANEOUS
  Administered 2022-04-30: 2 [IU] via SUBCUTANEOUS
  Administered 2022-04-30 (×2): 3 [IU] via SUBCUTANEOUS
  Administered 2022-04-30: 2 [IU] via SUBCUTANEOUS
  Administered 2022-05-01: 3 [IU] via SUBCUTANEOUS
  Administered 2022-05-01: 2 [IU] via SUBCUTANEOUS

## 2022-04-19 NOTE — Progress Notes (Addendum)
PROGRESS NOTE  TRINITA DEVLIN  DOB: Jul 08, 1956  PCP: Dorna Mai, MD IPJ:[id number]  DOA: 03/21/2022  LOS: 57 days  Hospital Day: 30  Brief narrative: Gabrielle [name] is a 66 y.o. female with PMH significant for DM2, HTN, HLD, PAD rectal cancer. 5/8, patient was admitted under vascular surgery on for critical limb ischemia and underwent emergency aortobifemoral bypass Postoperatively she was monitored in ICU. 5/10 - 5/20, intubated and mechanically ventilated. Course was complicated by shock, stress-induced cardiomyopathy, persistent leukocytosis, paralytic ileus requiring TPN. Ileus subsequently resolved and started on oral diet. 6/2, transferred out off ICU. [facility] service consulted for continued medical management.  Subjective: Patient was seen and examined this morning.   More awake, alert, she was able to move both her hands and right leg for me today.  Left leg is still weak.  Appetite poor.  Order placed for PEG tube placement.  Principal Problem:   PAD (peripheral artery disease) (HCC) Active Problems:   SIRS (systemic inflammatory response syndrome) (HCC)   Acute systolic heart failure (HCC)   Delirium   Protein-calorie malnutrition, severe   Type 2 diabetes mellitus (HCC)   Pressure ulcer, sacrum    Assessment and plan: Postoperative cardiogenic shock Acute systolic heart failure -Patient's postop course was complicated by cardiogenic shock in the setting of stress-induced cardiomyopathy as evidenced by sequential echocardiogram 5/9 EF 55 to 60% 5/10 EF 20 to 25%.  5/21 EF to 55 to 60% -Seen by critical care, cardiology.  Postoperative shock was treated with pressors, hydration and subsequent diuresis.  Currently volume status stable.  Ischemia evaluation was not required. -Currently on Coreg 12.5 mg twice daily.  Entresto held on 6/4.  Mains tachycardic to 120s this morning.  Increased Coreg from 12.'5mg'$  to 25 mg twice daily.  Continue to monitor.  Critical limb  ischemia  PAD/HLD -Status post aortobifemoral bypass 5/8 -Continue aspirin, statin   UTI -On 6/3, patient was noted to have uptrending leukocytosis.  Patient had a temperature up to 100.3. -Urinalysis showed cloudy urine, many leukocytes, positive nitrite.   -Blood culture and urine cultures obtained. -Patient is currently on IV Zosyn -Continue to monitor. Recent Labs  Lab 04/15/22 0500 04/16/22 0441 04/17/22 0500 04/18/22 0323 04/19/22 0530  WBC 19.6* 20.8* 16.2* 13.6* 73.4*   Acute metabolic encephalopathy -Likely from combination of shock, prolonged ICU stay, UTI.   -Currently on Seroquel 25 units at bedtime.  Continue to monitor mental status changes  -Tramadol for pain. Neurontin on hold  Uncontrolled type 2 diabetes mellitus with hyperglycemia -A1c elevated to 9.2 on 5/1 -Home meds include semaglutide, Januvia, metformin, Farxiga -Currently on Tradjenta, metformin, Levemir 25 units twice daily, NovoLog 3 units 3 times daily pre-meal, sliding scale insulin with Accu-Cheks.  Blood sugar level still remains elevated.  Discussed with diabetes care coordinator this morning.  Dose adjusted. Recent Labs  Lab 04/18/22 2012 04/19/22 0009 04/19/22 0424 04/19/22 0857 04/19/22 1128  GLUCAP 157* 177* 309* 396* 351*   Acute diarrhea -Likely because of tube feeding.  I will start her on Imodium twice daily to improve bowel transit time.  Protein-calorie malnutrition, severe -RD consult obtained.  Continue tube feeding and  nutritional supplements.   Unstageable sacral ulcer -Not present on admission.  Wound care consult appreciated  Impaired mobility  -PT eval obtained.  SNF recommended  Goals of care   Code Status: Full Code    Mobility: Needs aggressive physical therapy  Skin assessment:  Pressure Injury 03/30/22 Sacrum Posterior;Right;Left Unstageable -  Full thickness tissue loss in which the base of the injury is covered by slough (yellow, tan, gray, green or  brown) and/or eschar (tan, brown or black) in the wound bed. (Active)  03/30/22 0800  Location: Sacrum  Location Orientation: Posterior;Right;Left  Staging: Unstageable - Full thickness tissue loss in which the base of the injury is covered by slough (yellow, tan, gray, green or brown) and/or eschar (tan, brown or black) in the wound bed.  Wound Description (Comments):   Present on Admission: No    Nutritional status:  Body mass index is 29.32 kg/m.  Nutrition Problem: Severe Malnutrition Etiology: acute illness Signs/Symptoms: mild fat depletion, moderate muscle depletion, energy intake < or equal to 50% for > or equal to 5 days     Diet:  Diet Order             DIET - DYS 1 Room service appropriate? Yes with Assist; Fluid consistency: Thin  Diet effective now                   DVT prophylaxis:  heparin injection 5,000 Units Start: 03/24/22 0845 SCD's Start: 03/21/22 1854   Antimicrobials: IV Zosyn Fluid: None Consultants: As above Family Communication: Family at bedside  Status is: Inpatient  Continue in-hospital care because: Profoundly weak, multiple medical issues, pending PEG tube placement sometime this week. Level of care: Progressive   Dispo: The patient is from: Home              Anticipated d/c is to: SNF versus LTAC              Patient currently is not medically stable to d/c.   Difficult to place patient No     Infusions:   sodium chloride Stopped (04/09/22 0351)   magnesium sulfate bolus IVPB      Scheduled Meds:  aspirin  81 mg Per Tube Daily   bacitracin   Topical TID   carvedilol  25 mg Per Tube BID WC   cephALEXin  500 mg Per Tube Q12H   Chlorhexidine Gluconate Cloth  6 each Topical Daily   feeding supplement  237 mL Oral TID BM   feeding supplement (PROSource TF)  90 mL Per Tube BID   feeding supplement (VITAL 1.5 CAL)  1,000 mL Per Tube Q24H   free water  100 mL Per Tube Q6H   heparin injection (subcutaneous)  5,000 Units  Subcutaneous Q8H   insulin aspart  0-15 Units Subcutaneous Q4H   insulin aspart  3 Units Subcutaneous TID WC   insulin detemir  25 Units Subcutaneous BID   leptospermum manuka honey  1 application. Topical Daily   linagliptin  5 mg Oral Daily   mouth rinse  15 mL Mouth Rinse BID   metFORMIN  1,000 mg Oral BID WC   nutrition supplement (JUVEN)  1 packet Per Tube BID BM   pantoprazole sodium  40 mg Per Tube Daily   QUEtiapine  25 mg Per Tube QHS   rosuvastatin  40 mg Per Tube Daily   sodium chloride flush  10-40 mL Intracatheter Q12H    PRN meds: sodium chloride, bisacodyl, guaiFENesin-dextromethorphan, hydrALAZINE, labetalol, lip balm, loperamide, magnesium sulfate bolus IVPB, ondansetron, oxyCODONE, phenol, silver nitrate applicators, sodium chloride flush, traMADol   Antimicrobials: Anti-infectives (From admission, onward)    Start     Dose/Rate Route Frequency Ordered Stop   04/18/22 1115  cephALEXin (KEFLEX) capsule 500 mg        500  mg Per Tube Every 12 hours 04/18/22 1023     04/16/22 2000  piperacillin-tazobactam (ZOSYN) IVPB 3.375 g  Status:  Discontinued        3.375 g 12.5 mL/hr over 240 Minutes Intravenous Every 8 hours 04/16/22 1901 04/18/22 1023   03/23/22 0830  piperacillin-tazobactam (ZOSYN) IVPB 3.375 g        3.375 g 12.5 mL/hr over 240 Minutes Intravenous Every 8 hours 03/23/22 0736 03/28/22 0121   03/21/22 1945  ceFAZolin (ANCEF) IVPB 2g/100 mL premix        2 g 200 mL/hr over 30 Minutes Intravenous Every 8 hours 03/21/22 1853 03/22/22 1144   03/21/22 0539  ceFAZolin (ANCEF) IVPB 2g/100 mL premix        2 g 200 mL/hr over 30 Minutes Intravenous 30 min pre-op 03/21/22 0539 03/21/22 0803       Objective: Vitals:   04/19/22 0915 04/19/22 1218  BP: 134/81 128/63  Pulse: (!) 123 (!) 123  Resp: 20 20  Temp: 99 F (37.2 C) 98.5 F (36.9 C)  SpO2: 97% 99%    Intake/Output Summary (Last 24 hours) at 04/19/2022 1452 Last data filed at 04/19/2022 1218 Gross  per 24 hour  Intake 720 ml  Output 1875 ml  Net -1155 ml   Filed Weights   04/12/22 0500 04/13/22 0500 04/18/22 0500  Weight: 58.6 kg 58.1 kg 61.5 kg   Weight change:  Body mass index is 29.32 kg/m.   Physical Exam: General exam: Elderly thin built African-American female.  Lethargic Skin: No rashes, lesions or ulcers. HEENT: Atraumatic, normocephalic, no obvious bleeding.  Core track feeding tube in place Lungs: Clear to auscultation bilaterally CVS: Tachycardic, no murmur  GI/Abd soft, mild distention noted, nontender, bowel sound present CNS: More awake. Oriented to place. Able to move both upper extremities and right leg today Psychiatry: Sad affect Extremities: No pedal edema, no calf tenderness.  Data Review: I have personally reviewed the laboratory data and studies available.  F/u labs ordered Unresulted Labs (From admission, onward)     Start     Ordered   04/18/22 0500  CBC with Differential/Platelet  Daily,   R     Question:  Specimen collection method  Answer:  Unit=Unit collect   04/17/22 1310   04/18/22 2060  Basic metabolic panel  Daily,   R     Question:  Specimen collection method  Answer:  Unit=Unit collect   04/17/22 1310   04/16/22 1458  Expectorated Sputum Assessment w Gram Stain, Rflx to Resp Cult  ONCE - URGENT,   URGENT        04/16/22 1502   03/26/22 0500  APTT  Daily,   R     Question:  Specimen collection method  Answer:  Unit=Unit collect   03/25/22 1230            Signed, Terrilee Croak, MD [facility] 04/19/2022

## 2022-04-19 NOTE — Progress Notes (Signed)
CT called to inform RN of right hematoma at retroperitoneal region related to leak. Paged MD Virl Cagey. No new orders at this time.  Gabrielle [name]

## 2022-04-19 NOTE — Plan of Care (Signed)

## 2022-04-19 NOTE — Progress Notes (Signed)
Speech Language Pathology Treatment: Dysphagia  Patient Details Name: Gabrielle [name] MRN: [id number] DOB: 02/03/1956 Today's Date: 04/19/2022 Time: 6226-3335 SLP Time Calculation (min) (ACUTE ONLY): 29 min  Assessment / Plan / Recommendation Clinical Impression  Pt seen for skilled SLP to assess po tolerance and determine readiness for dietary advancement.   Pt easily awoke but did not allow SLP to brush her tongue/gums despite multiple visual/verbal/tactile cues.  Attempts at denture placement was not successful as pt starting gagging withe placement attempts.  Significantly delayed responses - more efficient with familiar staff.  Pt did admit that she wanted water, able to assist to hold bottle with right hand using hand over hand assist. Pt is impulsive and takes large sequential swallows - resulting in immediate cough post-sequential swallows. Single small boluses tolerated well without any indication of aspiration.  SLP provided pt with pureed waffle and grits- prolonged oral manipulation without oral transit.  Use of liquid faciliated clearance of grits but pt expectorated waffle after inability to orally transit over 4 minutes.  Recommend continue current diet - LIQUIDS will be most efficient for pt - Advise consider Glucerna for nutrition if dietician agrees.  Per RN, spouse reported pt ate small amounts at home PTA - nutrition risk remains elevated given poor po even when alert. Pt is making slow progress and will benefit from 24/7 assistance at dc.    HPI HPI: Patient is a 66 y/o female who presents on 5/8 for open aortobifemoral bypass and Bilateral femoral endarterectomy complicated by MI, heart failure and shock, intubated 5/10-5/20. PMH includes rectal cancer, DM2, HTN; Seen 04/14/22 by ST and recommended Dysphagia 1/thin liquids; ST f/u for diet tolerance and potential upgrade.      SLP Plan  Continue with current plan of care      Recommendations for follow up therapy are one  component of a multi-disciplinary discharge planning process, led by the attending physician.  Recommendations may be updated based on patient status, additional functional criteria and insurance authorization.    Recommendations  Diet recommendations: Dysphagia 1 (puree);Thin liquid Liquids provided via: Straw Medication Administration: Via alternative means Supervision: Staff to assist with self feeding;Full supervision/cueing for compensatory strategies Compensations: Minimize environmental distractions;Slow rate;Small sips/bites Postural Changes and/or Swallow Maneuvers: Seated upright 90 degrees;Upright 30-60 min after meal (side lying ok)                Oral Care Recommendations: Oral care BID;Staff/trained caregiver to provide oral care Follow Up Recommendations: Acute inpatient rehab (3hours/day) Assistance recommended at discharge: Frequent or constant Supervision/Assistance SLP Visit Diagnosis: Dysphagia, oropharyngeal phase (R13.12) Plan: Continue with current plan of care          Kathleen Lime, MS [place] Office [phone] Pager [phone]  Macario Golds  04/19/2022, 9:19 AM

## 2022-04-19 NOTE — Progress Notes (Signed)
  Progress Note    04/19/2022 7:28 AM 29 Days Post-Op  Subjective:  no complaints. Shakes her head no when asking about pain. Mostly just kept eyes closed   Vitals:   04/19/22 0300 04/19/22 0424  BP:  134/72  Pulse:  (!) 116  Resp: (!) 21 20  Temp:  99.1 F (37.3 C)  SpO2:  100%   Physical Exam: Cardiac:  tachycardic Lungs:  non labored Incisions:  abdominal incisions and groin incisions are healing well Extremities:  well perfused and warm, edematous Abdomen:  slightly distended Neurologic: alert  CBC    Component Value Date/Time   WBC 15.8 (H) 04/19/2022 0530   RBC 3.24 (L) 04/19/2022 0530   HGB 9.1 (L) 04/19/2022 0530   HGB 14.5 03/22/2021 0916   HCT 29.0 (L) 04/19/2022 0530   HCT 43.9 03/22/2021 0916   PLT 295 04/19/2022 0530   PLT 261 03/22/2021 0916   MCV 89.5 04/19/2022 0530   MCV 90 03/22/2021 0916   MCH 28.1 04/19/2022 0530   MCHC 31.4 04/19/2022 0530   RDW 14.9 04/19/2022 0530   RDW 12.5 03/22/2021 0916   LYMPHSABS 3.7 04/19/2022 0530   MONOABS 0.6 04/19/2022 0530   EOSABS 0.1 04/19/2022 0530   BASOSABS 0.0 04/19/2022 0530    BMET    Component Value Date/Time   NA 138 04/19/2022 0530   NA 137 03/22/2021 0916   K 3.8 04/19/2022 0530   CL 109 04/19/2022 0530   CO2 22 04/19/2022 0530   GLUCOSE 357 (H) 04/19/2022 0530   BUN 28 (H) 04/19/2022 0530   BUN 11 03/22/2021 0916   CREATININE 0.67 04/19/2022 0530   CREATININE 0.74 11/11/2015 1921   CALCIUM 8.3 (L) 04/19/2022 0530   GFRNONAA >60 04/19/2022 0530   GFRNONAA >89 11/11/2015 1921   GFRAA 78 10/23/2020 1701   GFRAA >89 11/11/2015 1921    INR    Component Value Date/Time   INR 1.1 03/27/2022 0510     Intake/Output Summary (Last 24 hours) at 04/19/2022 0728 Last data filed at 04/19/2022 0600 Gross per 24 hour  Intake 480 ml  Output 2500 ml  Net -2020 ml     Assessment/Plan:  66 y.o. female is s/p aortobifemoral bypass 29 Days Post-Op   Bilateral lower extremities remain well  perfused and warm Incisions healing well H&H stable WBC slightly higher this morning 15.8. On zosyn. UTI being treated. Remains on dysphagia 1 diet. Minimal PO intake. Continue Night time Feeds. Plan for PEG tube by end of weak Hyperglycemia. SSI adjusted Appreciate TRH assistance Continue Therapies. Appreciate PT/ OT/ RD assistance  Recommending SNF at d/c   Karoline Caldwell, PA-C [facility] [phone] 04/19/2022 7:28 AM

## 2022-04-19 NOTE — Consult Note (Addendum)
[facility] Nurse wound follow up Pt with Unstageable pressure injury to sacrum, performed remotely after review of progress notes and photos in the EMR.  Pt continues to receive hydrotherapy from physical therapy to assist with removal of nonviable tissue and Medihoney as topical treatment.  Wound remains with a significant amount of yellow slough and hydrotherapy would be the optimal plan of care to continue if possible. Refer to their notes for measurements and percentages. [facility] team will continue to review the plan of care weekly along with physical therapy. Julien Girt MSN, RN, [place], [place], [place]

## 2022-04-19 NOTE — Progress Notes (Signed)
  Progress Note    04/19/2022 12:51 PM 29 Days Post-Op  Subjective:  no complaints. Shakes her head no when asking about pain. Mostly just kept eyes closed   Vitals:   04/19/22 0915 04/19/22 1218  BP: 134/81 128/63  Pulse: (!) 123 (!) 123  Resp: 20 20  Temp: 99 F (37.2 C) 98.5 F (36.9 C)  SpO2: 97% 99%   Physical Exam: Cardiac:  tachycardic Lungs:  non labored Incisions:  abdominal incisions and groin incisions are healing well Extremities:  well perfused and warm, edematous Abdomen:  slightly distended Neurologic: alert  CBC    Component Value Date/Time   WBC 15.8 (H) 04/19/2022 0530   RBC 3.24 (L) 04/19/2022 0530   HGB 9.1 (L) 04/19/2022 0530   HGB 14.5 03/22/2021 0916   HCT 29.0 (L) 04/19/2022 0530   HCT 43.9 03/22/2021 0916   PLT 295 04/19/2022 0530   PLT 261 03/22/2021 0916   MCV 89.5 04/19/2022 0530   MCV 90 03/22/2021 0916   MCH 28.1 04/19/2022 0530   MCHC 31.4 04/19/2022 0530   RDW 14.9 04/19/2022 0530   RDW 12.5 03/22/2021 0916   LYMPHSABS 3.7 04/19/2022 0530   MONOABS 0.6 04/19/2022 0530   EOSABS 0.1 04/19/2022 0530   BASOSABS 0.0 04/19/2022 0530    BMET    Component Value Date/Time   NA 138 04/19/2022 0530   NA 137 03/22/2021 0916   K 3.8 04/19/2022 0530   CL 109 04/19/2022 0530   CO2 22 04/19/2022 0530   GLUCOSE 357 (H) 04/19/2022 0530   BUN 28 (H) 04/19/2022 0530   BUN 11 03/22/2021 0916   CREATININE 0.67 04/19/2022 0530   CREATININE 0.74 11/11/2015 1921   CALCIUM 8.3 (L) 04/19/2022 0530   GFRNONAA >60 04/19/2022 0530   GFRNONAA >89 11/11/2015 1921   GFRAA 78 10/23/2020 1701   GFRAA >89 11/11/2015 1921    INR    Component Value Date/Time   INR 1.1 03/27/2022 0510     Intake/Output Summary (Last 24 hours) at 04/19/2022 1251 Last data filed at 04/19/2022 1218 Gross per 24 hour  Intake 720 ml  Output 2275 ml  Net -1555 ml      Assessment/Plan:  66 y.o. female is s/p aortobifemoral bypass 29 Days Post-Op   Bilateral lower  extremities remain well perfused and warm Incisions healing well H&H stable WBC slightly higher this morning 15.8. On zosyn. UTI being treated. Remains on dysphagia 1 diet. Minimal PO intake. Continue Night time Feeds. Plan for PEG tube by end of weak Hyperglycemia. SSI adjusted Appreciate TRH assistance Continue Therapies. Appreciate PT/ OT/ RD assistance  Recommending SNF at d/c   Gabrielle [name], [place] [facility] [phone] 04/19/2022 12:51 PM  VASCULAR STAFF ADDENDUM: I have independently interviewed and examined the patient. I agree with the above.  Much more alert and oriented today. Talking with family.  Continues to have watery BM. No blood. Likely from tube feeds. May benefit from increased fiber. Will discus PEG tomorrow with [name] and [place], possible placement later this week. Appreciate care by Dr. Pietro Cassis [facility] and therapy services   Cassandria Santee, MD [facility] Phone Number: [phone] 04/19/2022 12:51 PM

## 2022-04-20 DIAGNOSIS — I739 Peripheral vascular disease, unspecified: Secondary | ICD-10-CM | POA: Diagnosis not present

## 2022-04-20 DIAGNOSIS — E43 Unspecified severe protein-calorie malnutrition: Secondary | ICD-10-CM | POA: Diagnosis not present

## 2022-04-20 DIAGNOSIS — I5021 Acute systolic (congestive) heart failure: Secondary | ICD-10-CM | POA: Diagnosis not present

## 2022-04-20 DIAGNOSIS — L89150 Pressure ulcer of sacral region, unstageable: Secondary | ICD-10-CM | POA: Diagnosis not present

## 2022-04-20 LAB — CULTURE, BLOOD (ROUTINE X 2)
Culture: NO GROWTH
Culture: NO GROWTH
Special Requests: ADEQUATE
Special Requests: ADEQUATE

## 2022-04-20 LAB — CBC WITH DIFFERENTIAL/PLATELET
Abs Immature Granulocytes: 0.09 10*3/uL — ABNORMAL HIGH (ref 0.00–0.07)
Basophils Absolute: 0 10*3/uL (ref 0.0–0.1)
Basophils Relative: 0 %
Eosinophils Absolute: 0.1 10*3/uL (ref 0.0–0.5)
Eosinophils Relative: 1 %
HCT: 29.2 % — ABNORMAL LOW (ref 36.0–46.0)
Hemoglobin: 9.1 g/dL — ABNORMAL LOW (ref 12.0–15.0)
Immature Granulocytes: 1 %
Lymphocytes Relative: 26 %
Lymphs Abs: 3.9 10*3/uL (ref 0.7–4.0)
MCH: 28.1 pg (ref 26.0–34.0)
MCHC: 31.2 g/dL (ref 30.0–36.0)
MCV: 90.1 fL (ref 80.0–100.0)
Monocytes Absolute: 0.7 10*3/uL (ref 0.1–1.0)
Monocytes Relative: 5 %
Neutro Abs: 10.2 10*3/uL — ABNORMAL HIGH (ref 1.7–7.7)
Neutrophils Relative %: 67 %
Platelets: 281 10*3/uL (ref 150–400)
RBC: 3.24 MIL/uL — ABNORMAL LOW (ref 3.87–5.11)
RDW: 15 % (ref 11.5–15.5)
WBC: 15.1 10*3/uL — ABNORMAL HIGH (ref 4.0–10.5)
nRBC: 0 % (ref 0.0–0.2)

## 2022-04-20 LAB — BASIC METABOLIC PANEL
Anion gap: 8 (ref 5–15)
BUN: 23 mg/dL (ref 8–23)
CO2: 23 mmol/L (ref 22–32)
Calcium: 8.7 mg/dL — ABNORMAL LOW (ref 8.9–10.3)
Chloride: 106 mmol/L (ref 98–111)
Creatinine, Ser: 0.65 mg/dL (ref 0.44–1.00)
GFR, Estimated: >60 mL/min (ref >60)
Glucose, Bld: 202 mg/dL — ABNORMAL HIGH (ref 70–99)
Potassium: 3.9 mmol/L (ref 3.5–5.1)
Sodium: 137 mmol/L (ref 135–145)

## 2022-04-20 LAB — GLUCOSE, CAPILLARY
Glucose-Capillary: 138 mg/dL — ABNORMAL HIGH (ref 70–99)
Glucose-Capillary: 156 mg/dL — ABNORMAL HIGH (ref 70–99)
Glucose-Capillary: 186 mg/dL — ABNORMAL HIGH (ref 70–99)
Glucose-Capillary: 192 mg/dL — ABNORMAL HIGH (ref 70–99)
Glucose-Capillary: 233 mg/dL — ABNORMAL HIGH (ref 70–99)
Glucose-Capillary: 242 mg/dL — ABNORMAL HIGH (ref 70–99)
Glucose-Capillary: 322 mg/dL — ABNORMAL HIGH (ref 70–99)

## 2022-04-20 LAB — APTT: aPTT: 22 seconds — ABNORMAL LOW (ref 24–36)

## 2022-04-20 NOTE — Progress Notes (Signed)
IR was requested for image guided G tube placement.   CT obtained, anatomy amendable for percutaneous G tube placement per Dr. Gerhard Perches.  Chart checked, patient on ASA 81 mg qd which required 5 day hold for G tube placement.   Will plan for G tube placement next week.    Ordering, attending, and Vascular surgery providers notified via secure chat.  Please call IR for questions and concerns.   Armando Gang Tanishi Nault PA-C 04/20/2022 1:54 PM

## 2022-04-20 NOTE — Progress Notes (Signed)
Physical Therapy Wound Treatment Patient Details  Name: Gabrielle [name] MRN: [id number] Date of Birth: 12/02/1955  Today's Date: 04/20/2022 Time: 7616-0737 Time Calculation (min): 66 min  Subjective  Subjective Assessment Subjective: Pt reports "I don't want to do this anymore" due to the pain, but then was agreeable to continue with a rest break. Patient and Family Stated Goals: to not hurt Date of Onset:  (unknown) Prior Treatments: dressing changes  Pain Score:  Pre-medicated with fair pain tolerance  Wound Assessment  Pressure Injury 03/30/22 Sacrum Posterior;Right;Left Unstageable - Full thickness tissue loss in which the base of the injury is covered by slough (yellow, tan, gray, green or brown) and/or eschar (tan, brown or black) in the wound bed. (Active)  Wound Image  04/18/22 1226  Dressing Type Foam - Lift dressing to assess site every shift;Barrier Film (skin prep);Gauze (Comment);Other (Comment) 04/20/22 1653  Dressing Clean, Dry, Intact 04/20/22 1653  Dressing Change Frequency Daily 04/20/22 1653  State of Healing Eschar 04/20/22 1653  Site / Wound Assessment Brown;Yellow;Pink;Pale;Black 04/20/22 1653  % Wound base Red or Granulating 20% 04/20/22 1653  % Wound base Yellow/Fibrinous Exudate 70% 04/20/22 1653  % Wound base Black/Eschar 10% 04/20/22 1653  % Wound base Other/Granulation Tissue (Comment) 0% 04/20/22 1653  Peri-wound Assessment Erythema (non-blanchable);Pink;Purple 04/20/22 1653  Wound Length (cm) 10 cm 04/18/22 1226  Wound Width (cm) 11.5 cm 04/18/22 1226  Wound Depth (cm) 1 cm 04/18/22 1226  Wound Surface Area (cm^2) 115 cm^2 04/18/22 1226  Wound Volume (cm^3) 115 cm^3 04/18/22 1226  Tunneling (cm) 0 04/18/22 1226  Undermining (cm) 0 04/18/22 1226  Margins Unattached edges (unapproximated) 04/20/22 1653  Drainage Amount Moderate 04/20/22 1653  Drainage Description Serosanguineous 04/20/22 1653  Treatment Debridement (Selective);Irrigation;Packing (Dry  gauze);Other (Comment) 04/20/22 1653   Hydrotherapy Pulsed lavage therapy - wound location: sacral Pulsed Lavage with Suction (psi): 12 psi Pulsed Lavage with Suction - Normal Saline Used: 1000 mL Pulsed Lavage Tip: Tip with splash shield Selective Debridement Selective Debridement - Location: sacral Selective Debridement - Tools Used: Forceps, Scalpel Selective Debridement - Tissue Removed: Black and yellow necrotic tissue with necrotic adipose tissue and purulent drainage/yellow slough exposed in pocket    Wound Assessment and Plan  Wound Therapy - Assess/Plan/Recommendations Wound Therapy - Clinical Statement: Pt pre-medicated, but pt still reporting pain throughout session, needing a rest break at one point. Pt was able to follow cues to limit restlessness and not reach back for the wound during the session, allowing for further selective debridement to be safely performed. Today, necrotic adipose tissue was removed at the superior aspect of the wound, exposing a deep pocket with undermining consisting of purulent drainage/yellow slough. A large amount of necrotic tissue was able to be successfully removed today, but removal of further tissue was limited by pt's pain and that the borders of the pocket had shallow aspects of necrotic tissue and deeper debridement resulted in bleeding. Notified MD and WOC RN of the opening of this pocket. Pt would continue to benefit from further hydrotherapy to further remove necrotic tissue and improve the appearance of the wound bed. Wound Therapy - Functional Problem List: functional debility after prolonged time in bed. Factors Delaying/Impairing Wound Healing: Immobility, Multiple medical problems, Vascular compromise, Diabetes Mellitus Hydrotherapy Plan: Dressing change, Debridement, Pulsatile lavage with suction, Patient/family education Wound Therapy - Frequency: 3X / week Wound Therapy - Current Recommendations: PT Wound Therapy - Follow Up  Recommendations: dressing changes by RN, dressing changes by family/patient  Wound  Therapy Goals- Improve the function of patient's integumentary system by progressing the wound(s) through the phases of wound healing (inflammation - proliferation - remodeling) by: Wound Therapy Goals - Improve the function of patient's integumentary system by progressing the wound(s) through the phases of wound healing by: Decrease Necrotic Tissue to: 10 percent Decrease Necrotic Tissue - Progress: Progressing toward goal Increase Granulation Tissue to: 90% Increase Granulation Tissue - Progress: Progressing toward goal Improve Drainage Characteristics: Min, Serous Improve Drainage Characteristics - Progress: Progressing toward goal Goals/treatment plan/discharge plan were made with and agreed upon by patient/family: Yes Time For Goal Achievement: 7 days Wound Therapy - Potential for Goals: Good  Goals will be updated until maximal potential achieved or discharge criteria met.  Discharge criteria: when goals achieved, discharge from hospital, MD decision/surgical intervention, no progress towards goals, refusal/missing three consecutive treatments without notification or medical reason.  GP     Charges PT Wound Care Charges $Wound Debridement up to 20 cm: < or equal to 20 cm $ Wound Debridement each add'l 20 sqcm: 4 $PT Hydrotherapy Visit: 1 Visit       Moishe Spice, PT, DPT Acute Rehabilitation Services  Office: [phone]   Orvan Falconer 04/20/2022, 5:03 PM

## 2022-04-20 NOTE — Progress Notes (Signed)
Inpatient Diabetes Program Recommendations  AACE/ADA: New Consensus Statement on Inpatient Glycemic Control (2015)  Target Ranges:  Prepandial:   less than 140 mg/dL      Peak postprandial:   less than 180 mg/dL (1-2 hours)      Critically ill patients:  140 - 180 mg/dL   Lab Results  Component Value Date   GLUCAP 322 (H) 04/20/2022   HGBA1C 9.2 (H) 03/14/2022    Review of Glycemic Control  Diabetes history: Type 2 DM Outpatient Diabetes medications: Farxiga 10 mg daily, Metformin 1000 mg BID, Rybelsus 14 mg daily, Januvia 25 mg daily Current orders for Inpatient glycemic control: Levemir 25 units BID, Novolog 3 units TID, Novolog 0-15 units Q4H, Tradjenta 5 mg QD, Metformin 1000 mg BID; Vital @ 50 ml/hr    Inpatient Diabetes Program Recommendations:    Consider increasing Novolog to 10 units TID (assuming patient is consuming >50% of meals).   Thanks, Bronson Curb, MSN, RNC-OB Diabetes Coordinator [phone] (8a-5p)

## 2022-04-20 NOTE — Progress Notes (Addendum)
  Progress Note    04/20/2022 7:53 AM 30 Days Post-Op  Subjective:  denies any pain this morning otherwise not very interactive   Vitals:   04/20/22 0000 04/20/22 0400  BP: 134/66 (!) 149/74  Pulse: (!) 107 (!) 111  Resp: (!) 23 20  Temp:  99 F (37.2 C)  SpO2: 95% 96%   Physical Exam: Cardiac:  tachycardic Lungs:  non labored Incisions:  abdominal incision and groin incisions healing well Extremities:  well perfused and warm Abdomen:  slightly distended, non tender Neurologic: alert  CBC    Component Value Date/Time   WBC 15.1 (H) 04/20/2022 0600   RBC 3.24 (L) 04/20/2022 0600   HGB 9.1 (L) 04/20/2022 0600   HGB 14.5 03/22/2021 0916   HCT 29.2 (L) 04/20/2022 0600   HCT 43.9 03/22/2021 0916   PLT 281 04/20/2022 0600   PLT 261 03/22/2021 0916   MCV 90.1 04/20/2022 0600   MCV 90 03/22/2021 0916   MCH 28.1 04/20/2022 0600   MCHC 31.2 04/20/2022 0600   RDW 15.0 04/20/2022 0600   RDW 12.5 03/22/2021 0916   LYMPHSABS 3.9 04/20/2022 0600   MONOABS 0.7 04/20/2022 0600   EOSABS 0.1 04/20/2022 0600   BASOSABS 0.0 04/20/2022 0600    BMET    Component Value Date/Time   NA 137 04/20/2022 0600   NA 137 03/22/2021 0916   K 3.9 04/20/2022 0600   CL 106 04/20/2022 0600   CO2 23 04/20/2022 0600   GLUCOSE 202 (H) 04/20/2022 0600   BUN 23 04/20/2022 0600   BUN 11 03/22/2021 0916   CREATININE 0.65 04/20/2022 0600   CREATININE 0.74 11/11/2015 1921   CALCIUM 8.7 (L) 04/20/2022 0600   GFRNONAA >60 04/20/2022 0600   GFRNONAA >89 11/11/2015 1921   GFRAA 78 10/23/2020 1701   GFRAA >89 11/11/2015 1921    INR    Component Value Date/Time   INR 1.1 03/27/2022 0510     Intake/Output Summary (Last 24 hours) at 04/20/2022 0753 Last data filed at 04/19/2022 2100 Gross per 24 hour  Intake 960 ml  Output 1625 ml  Net -665 ml     Assessment/Plan:  66 y.o. female is s/p aortobifemoral bypass  30 Days Post-Op   Bilateral lower extremities remain well perfused and  warm Incisions healing well H&H stable WBC stable. Remains on zosyn for UTI Remains on dysphagia 1 diet. Minimal PO intake. Continue Night time Feeds. Plan for PEG tube by end of weak Appreciate TRH and therapy services  for management of patient   Marval Regal [facility] [phone] 04/20/2022 7:53 AM  Doing well this afternoon. Conversational Continues to have poor PO intake - discussed PEG tube placement CT reviewed demonstrating hematoma. No plans to drain at this time as drain can provide a nidus for infection, which in turn could infect her endograft.  Recommend continuing zosyn for UTI and transitioning to long term suppressive antibiotic until hematoma resolves.  Overall improving needs to continue to participate in aggressive therapy  Broadus John MD

## 2022-04-20 NOTE — Progress Notes (Signed)
PROGRESS NOTE    Gabrielle [name]  XBM:[id number] DOB: 04/08/1956 DOA: 03/21/2022 PCP: Dorna Mai, MD   Brief Narrative: Gabrielle [name] is a 66 y.o. female with PMH significant for DM2, HTN, HLD, PAD rectal cancer. 5/8, patient was admitted under vascular surgery on for critical limb ischemia and underwent emergency aortobifemoral bypass Postoperatively she was monitored in ICU. 5/10 - 5/20, intubated and mechanically ventilated. Course was complicated by shock, stress-induced cardiomyopathy, persistent leukocytosis, paralytic ileus requiring TPN. Ileus subsequently resolved and started on oral diet. 6/2, transferred out off ICU. [facility] service consulted for continued medical management.   Assessment and Plan:  Postoperative cardiogenic shock Acute systolic heart failure Patient required ICU admission and initiation of vasopressin and norepinephrine for management of shock. Cardiology consulted. Serial Transthoracic Echocardiogram significant for EF of 55-60% > 20-25% > 55-60%. Patient diuresed with improvement of clinical status. Shock resolved and vasopressors weaned off. Resolved.  Acute respiratory failure with hypoxia Patient required ICU admission and intubation for respiratory distress. Patient successfully extubated. Weaned to room air. Resolved.  Critical limb ischemia PAD Hyperlipidemia Patient initially admitted for aortobifemoral bypass by vascular surgery. Currently stable. -Continue aspirin 81 mg daily and Crestor 40 mg daily  Complicated UTI Associated fevers. Urine culture (6/3) significant for E. Coli bacteruria. Patient empirically treated with Zosyn which has transitioned to Keflex. Blood cultures (6/2) with no growth. -Continue 7 day course of antibiotics  Acute metabolic encephalopathy In setting of ICU admission/shock/UTI. Patient mental status appears to be improved.  Diabetes mellitus, type 2 Uncontrolled with hyperglycemia. Patient is on Januvia,  metformin and Wilder Glade as an outpatient. Januvia and [place] held on admission. Started on insulin this admission. Issues with control secondary to tube feeds. Tradjenta started while inpatient. -Continue Levemir 25 units Novolog 3 units TID with meals and SSI -Discontinue metformin and Tradjenta while inpatient  Acute diarrhea In setting of tube feedings. Started on imodium.  Severe malnutrition Dietitian consulted. Patient with a Cortrak NG tube on tube feeds. Received TPN which is now discontinued. Now with plans for PEG tube placement. Complicated by dysphagia. -Dietitian recommendations (6/4): Change to nocturnal tube feeding via Cortrak:  Vital 1.5 at 70 ml/hr x 14 hours (980 ml total) Pro-Source TF 90 mL BID Provides 1630 kcals (85% of estimated needs), 110 g of protein (100% of minimum estimated needs), 749 mL of free water. Free water of 100 mL q 6 hours with TF provides 1148 mL of free water. Continue Juven BID per tube, each packet provides 80 calories, 8 grams of carbohydrate, 2.5  grams of protein (collagen), 7 grams of L-arginine and 7 grams of L-glutamine; supplement contains CaHMB, Vitamins C, E, B12 and Zinc to promote wound healing. Continue Magic cup TID with meals, each supplement provides 290 kcal and 9 grams of protein. Continue Ensure Plus High Protein po TID, each supplement provides 350 kcal and 20 grams of protein. Feeding assistance at meal times and with supplements. -PEG tube per general surgery  Sacral ulcer Unstageable. Not present on admission. Wound care consulted. Receiving hydrotherapy.    DVT prophylaxis: Heparin subq Code Status:   Code Status: Full Code Family Communication: Husband at bedside Disposition Plan: Discharge to SNF pending PEG tube placement, specialist recommendations and bed availability   Consultants:  Cardiology PCCM Vascular surgery  Procedures:  Transthoracic Echocardiogram (5/9) Intubation (5/10 >> 5/20) Limited  Transthoracic Echocardiogram (5/10) Cortrak placement (5/17) Limited Transthoracic Echocardiogram (5/21)  Antimicrobials: Zosyn Cephalexin    Subjective: No concerns today.  Objective: BP 126/67 (BP Location: Left Arm)   Pulse (!) 120   Temp 99 F (37.2 C) (Axillary)   Resp 19   Ht '4\' 9"'$  (1.448 m)   Wt 61.5 kg   SpO2 97%   BMI 29.34 kg/m   Examination:  General exam: Appears calm and comfortable Respiratory system: Clear to auscultation. Respiratory effort normal. Cardiovascular system: S1 & S2 heard, RRR. No murmurs, rubs, gallops or clicks. Gastrointestinal system: Abdomen is nondistended, soft and nontender. Normal bowel sounds heard. Central nervous system: Alert. Musculoskeletal: No calf tenderness Skin: No cyanosis. No rashes Psychiatry: Judgement and insight appear normal. Mood & affect appropriate.    Data Reviewed: I have personally reviewed following labs and imaging studies  CBC Lab Results  Component Value Date   WBC 15.1 (H) 04/20/2022   RBC 3.24 (L) 04/20/2022   HGB 9.1 (L) 04/20/2022   HCT 29.2 (L) 04/20/2022   MCV 90.1 04/20/2022   MCH 28.1 04/20/2022   PLT 281 04/20/2022   MCHC 31.2 04/20/2022   RDW 15.0 04/20/2022   LYMPHSABS 3.9 04/20/2022   MONOABS 0.7 04/20/2022   EOSABS 0.1 04/20/2022   BASOSABS 0.0 56/38/7564     Last metabolic panel Lab Results  Component Value Date   NA 137 04/20/2022   K 3.9 04/20/2022   CL 106 04/20/2022   CO2 23 04/20/2022   BUN 23 04/20/2022   CREATININE 0.65 04/20/2022   GLUCOSE 202 (H) 04/20/2022   GFRNONAA >60 04/20/2022   GFRAA 78 10/23/2020   CALCIUM 8.7 (L) 04/20/2022   PHOS 3.0 04/08/2022   PROT 6.7 04/07/2022   ALBUMIN 2.4 (L) 04/07/2022   LABGLOB 3.7 03/22/2021   AGRATIO 1.2 03/22/2021   BILITOT 0.6 04/07/2022   ALKPHOS 108 04/07/2022   AST 41 04/07/2022   ALT 42 04/07/2022   ANIONGAP 8 04/20/2022    GFR: Estimated Creatinine Clearance: 52.9 mL/min (by C-G formula based on SCr of  0.65 mg/dL).  Recent Results (from the past 240 hour(s))  Culture, blood (Routine X 2) w Reflex to ID Panel     Status: None   Collection Time: 04/15/22  7:33 PM   Specimen: BLOOD  Result Value Ref Range Status   Specimen Description BLOOD BLOOD LEFT ARM  Final   Special Requests   Final    BOTTLES DRAWN AEROBIC AND ANAEROBIC Blood Culture adequate volume   Culture   Final    NO GROWTH 5 DAYS Performed at [facility], [address], [place], [place] [address]    Report Status 04/20/2022 FINAL  Final  Culture, blood (Routine X 2) w Reflex to ID Panel     Status: None   Collection Time: 04/15/22  7:36 PM   Specimen: BLOOD  Result Value Ref Range Status   Specimen Description BLOOD BLOOD LEFT FOREARM  Final   Special Requests   Final    BOTTLES DRAWN AEROBIC ONLY Blood Culture adequate volume   Culture   Final    NO GROWTH 5 DAYS Performed at [facility], [place] [address], [place],  [address]    Report Status 04/20/2022 FINAL  Final  Urine Culture     Status: Abnormal   Collection Time: 04/16/22  7:54 AM   Specimen: Urine, Catheterized  Result Value Ref Range Status   Specimen Description URINE, CATHETERIZED  Final   Special Requests   Final    NONE Performed at [facility], [place],  [place] [address]    Culture >=100,000 COLONIES/mL ESCHERICHIA COLI (A)  Final   Report Status 04/18/2022 FINAL  Final   Organism ID, Bacteria ESCHERICHIA COLI (A)  Final      Susceptibility   Escherichia coli - MIC*    AMPICILLIN >=32 RESISTANT Resistant     CEFAZOLIN 8 SENSITIVE Sensitive     CEFEPIME <=0.12 SENSITIVE Sensitive     CEFTRIAXONE <=0.25 SENSITIVE Sensitive     CIPROFLOXACIN >=4 RESISTANT Resistant     GENTAMICIN <=1 SENSITIVE Sensitive     IMIPENEM <=0.25 SENSITIVE Sensitive     NITROFURANTOIN <=16 SENSITIVE Sensitive     TRIMETH/SULFA <=20 SENSITIVE Sensitive     AMPICILLIN/SULBACTAM >=32 RESISTANT Resistant     PIP/TAZO <=4  SENSITIVE Sensitive     * >=100,000 COLONIES/mL ESCHERICHIA COLI      Radiology Studies: CT ABDOMEN PELVIS WO CONTRAST  Result Date: 04/19/2022 CLINICAL DATA:  anatomy evaluation for g-tube placement in IR EXAM: CT ABDOMEN AND PELVIS WITHOUT CONTRAST TECHNIQUE: Multidetector CT imaging of the abdomen and pelvis was performed following the standard protocol without IV contrast. RADIATION DOSE REDUCTION: This exam was performed according to the departmental dose-optimization program which includes automated exposure control, adjustment of the mA and/or kV according to patient size and/or use of iterative reconstruction technique. COMPARISON:  January 13, 2022 FINDINGS: Lower chest: There is bibasilar atelectasis seen. Mild cardiomegaly. Hepatobiliary: No focal liver abnormality is seen. No gallstones, gallbladder wall thickening, or biliary dilatation. Pancreas: Unremarkable. No pancreatic ductal dilatation or surrounding inflammatory changes. Spleen: Normal in size without focal abnormality. Adrenals/Urinary Tract: Bilateral adrenals and left kidney have a normal appearance. There is pelvicaliectasis seen in the right kidney. Stomach/Bowel: Stomach is within normal limits. Appendix is not delineated. No evidence of bowel wall thickening, distention, or inflammatory changes. There is an NG tube in the body of the stomach. The transverse colon is located inferior to the stomach and the stomach may be amenable for G-tube insertion. Vascular/Lymphatic: There has been interval aortobifemoral bypass graft placement. There is large fluid, most likely hematoma seen at the right retroperitoneal region extending from the right mid pelvis up to the inferior aspect of the liver measuring approximately 11 x 11.2 x 15.4 cm likely arising from the leak at the distal vascular anastomotic site. Reproductive: Status post hysterectomy. Stable appearance of the adnexal tissue. Other: Right retroperitoneal hematoma. There is large  decubitus ulcer seen at and inferior to the coccyx. Musculoskeletal: No acute or significant osseous findings. IMPRESSION: 1. There is an NG tube with its distal end in the body of the stomach. The transverse colon is located inferior to the stomach. The stomach may be amenable for a G-tube insertion in the IR. 2. There has been interval aortobifemoral bypass graft placement. There is a large hematoma, appears subacute at the right retroperitoneal region likely arising from the distal anastomotic site measuring approximately 11 x 11.2 x 15.4 cm. 3. Large decubitus ulcer at and inferior to the tip of the coccyx. The decubitus ulcer is new since the previous study. These results will be called to the ordering clinician or representative by the Radiologist Assistant, and communication documented in the PACS or [employer]. Electronically Signed   By: Frazier Richards M.D.   On: 04/19/2022 16:33      LOS: 30 days    Cordelia Poche, MD [facility] 04/20/2022, 3:41 PM   If 7PM-7AM, please contact night-coverage [email or web address]

## 2022-04-21 DIAGNOSIS — I5021 Acute systolic (congestive) heart failure: Secondary | ICD-10-CM | POA: Diagnosis not present

## 2022-04-21 DIAGNOSIS — I739 Peripheral vascular disease, unspecified: Secondary | ICD-10-CM | POA: Diagnosis not present

## 2022-04-21 DIAGNOSIS — E43 Unspecified severe protein-calorie malnutrition: Secondary | ICD-10-CM | POA: Diagnosis not present

## 2022-04-21 DIAGNOSIS — L89150 Pressure ulcer of sacral region, unstageable: Secondary | ICD-10-CM | POA: Diagnosis not present

## 2022-04-21 LAB — PROCALCITONIN: Procalcitonin: 0.22 ng/mL

## 2022-04-21 LAB — COMPREHENSIVE METABOLIC PANEL
ALT: 98 U/L — ABNORMAL HIGH (ref 0–44)
AST: 58 U/L — ABNORMAL HIGH (ref 15–41)
Albumin: 1.9 g/dL — ABNORMAL LOW (ref 3.5–5.0)
Alkaline Phosphatase: 73 U/L (ref 38–126)
Anion gap: 4 — ABNORMAL LOW (ref 5–15)
BUN: 23 mg/dL (ref 8–23)
CO2: 24 mmol/L (ref 22–32)
Calcium: 8.4 mg/dL — ABNORMAL LOW (ref 8.9–10.3)
Chloride: 105 mmol/L (ref 98–111)
Creatinine, Ser: 0.66 mg/dL (ref 0.44–1.00)
GFR, Estimated: >60 mL/min (ref >60)
Glucose, Bld: 318 mg/dL — ABNORMAL HIGH (ref 70–99)
Potassium: 4.1 mmol/L (ref 3.5–5.1)
Sodium: 133 mmol/L — ABNORMAL LOW (ref 135–145)
Total Bilirubin: 0.3 mg/dL (ref 0.3–1.2)
Total Protein: 6.6 g/dL (ref 6.5–8.1)

## 2022-04-21 LAB — GLUCOSE, CAPILLARY
Glucose-Capillary: 158 mg/dL — ABNORMAL HIGH (ref 70–99)
Glucose-Capillary: 171 mg/dL — ABNORMAL HIGH (ref 70–99)
Glucose-Capillary: 183 mg/dL — ABNORMAL HIGH (ref 70–99)
Glucose-Capillary: 202 mg/dL — ABNORMAL HIGH (ref 70–99)
Glucose-Capillary: 241 mg/dL — ABNORMAL HIGH (ref 70–99)
Glucose-Capillary: 307 mg/dL — ABNORMAL HIGH (ref 70–99)
Glucose-Capillary: 311 mg/dL — ABNORMAL HIGH (ref 70–99)

## 2022-04-21 LAB — CBC
HCT: 27.7 % — ABNORMAL LOW (ref 36.0–46.0)
Hemoglobin: 8.7 g/dL — ABNORMAL LOW (ref 12.0–15.0)
MCH: 28.1 pg (ref 26.0–34.0)
MCHC: 31.4 g/dL (ref 30.0–36.0)
MCV: 89.4 fL (ref 80.0–100.0)
Platelets: 271 10*3/uL (ref 150–400)
RBC: 3.1 MIL/uL — ABNORMAL LOW (ref 3.87–5.11)
RDW: 15 % (ref 11.5–15.5)
WBC: 16.3 10*3/uL — ABNORMAL HIGH (ref 4.0–10.5)
nRBC: 0 % (ref 0.0–0.2)

## 2022-04-21 LAB — APTT: aPTT: 33 seconds (ref 24–36)

## 2022-04-21 MED ORDER — INSULIN ASPART 100 UNIT/ML IJ SOLN
4.0000 [IU] | INTRAMUSCULAR | Status: DC
  Administered 2022-04-21 – 2022-04-24 (×6): 4 [IU] via SUBCUTANEOUS

## 2022-04-21 MED ORDER — FENTANYL CITRATE PF 50 MCG/ML IJ SOSY
25.0000 ug | PREFILLED_SYRINGE | Freq: Every day | INTRAMUSCULAR | Status: DC | PRN
  Administered 2022-04-22 – 2022-04-29 (×4): 25 ug via INTRAVENOUS
  Filled 2022-04-21 (×4): qty 1

## 2022-04-21 MED ORDER — PIPERACILLIN-TAZOBACTAM 3.375 G IVPB
3.3750 g | Freq: Three times a day (TID) | INTRAVENOUS | Status: DC
  Administered 2022-04-21 – 2022-05-01 (×30): 3.375 g via INTRAVENOUS
  Filled 2022-04-21 (×29): qty 50

## 2022-04-21 MED ORDER — INSULIN ASPART 100 UNIT/ML IJ SOLN
3.0000 [IU] | Freq: Three times a day (TID) | INTRAMUSCULAR | Status: DC
  Administered 2022-04-23: 3 [IU] via SUBCUTANEOUS

## 2022-04-21 MED ORDER — CEFAZOLIN SODIUM-DEXTROSE 2-4 GM/100ML-% IV SOLN: 2.0000 g | INTRAVENOUS | Status: AC

## 2022-04-21 MED ORDER — PROSOURCE TF PO LIQD
90.0000 mL | Freq: Three times a day (TID) | ORAL | Status: DC
  Administered 2022-04-21 – 2022-04-27 (×18): 90 mL
  Filled 2022-04-21 (×19): qty 90

## 2022-04-21 MED ORDER — GLUCERNA 1.5 CAL PO LIQD
1000.0000 mL | ORAL | Status: DC
  Administered 2022-04-21 – 2022-04-26 (×7): 1000 mL
  Filled 2022-04-21 (×7): qty 1000

## 2022-04-21 MED ORDER — HYDROMORPHONE HCL 1 MG/ML IJ SOLN
0.5000 mg | Freq: Every day | INTRAMUSCULAR | Status: DC | PRN
Start: 2022-04-21 — End: 2022-04-21

## 2022-04-21 MED ORDER — SODIUM CHLORIDE 0.9 % IV SOLN
2.0000 g | INTRAVENOUS | Status: DC
  Filled 2022-04-21: qty 20

## 2022-04-21 MED ORDER — METRONIDAZOLE 500 MG PO TABS: 500.0000 mg | ORAL_TABLET | Freq: Two times a day (BID) | ORAL | Status: DC

## 2022-04-21 NOTE — Progress Notes (Signed)
Nutrition Follow-up  DOCUMENTATION CODES:   Severe malnutrition in context of acute illness/injury  INTERVENTION:   Continue tube feeding via Cortrak:  Change to Glucerna 1.5 at 70 ml/hr x 14 hours (980 ml total) Pro-Source TF 90 mL TID Provides 1710 kcals (90% of estimated needs), 125 g of protein (100% of estimated needs), 789 mL of free water.  Free water of 100 mL q 6 hours with TF provides 1189 ml of free water.  Continue Juven BID per tube, each packet provides 80 calories, 8 grams of carbohydrate, 2.5  grams of protein (collagen), 7 grams of L-arginine and 7 grams of L-glutamine; supplement contains CaHMB, Vitamins C, E, B12 and Zinc to promote wound healing.  Continue Magic cup TID with meals, each supplement provides 290 kcal and 9 grams of protein.  Continue Ensure Plus High Protein po TID, each supplement provides 350 kcal and 20 grams of protein.  Feeding assistance at meal times and with supplements.  NUTRITION DIAGNOSIS:   Severe Malnutrition related to acute illness as evidenced by mild fat depletion, moderate muscle depletion, energy intake < or equal to 50% for > or equal to 5 days.  Ongoing   GOAL:   Patient will meet greater than or equal to 90% of their needs  Met with TF  MONITOR:   Vent status, Weight trends, Labs, TF tolerance  REASON FOR ASSESSMENT:   Consult, Ventilator Enteral/tube feeding initiation and management  ASSESSMENT:   66 yo female admitted for aortobifemoral bypass due to right-sided Rutherford 4 critical limb ischemia, left-sided Rutherford 3 critical limb ischemia.  Pt developed postop shock and respiratory failure requiring intubation.  PMH includes DM, HTN, HLD, severe PAD  Patient remains on a dysphagia 1 diet with thin liquids. Meal intakes: 5-10% x 2 meals 6/6; 0-10% x 2 meals 6/5. She is being offered Ensure supplements TID between meals.  Per RN, patient is drinking about half of the supplements.   Currently receiving  Vital 1.5 at 70 ml/h x 14 hours via Cortrak with Prosource TF 90 ml BID. Free water flushes 100 ml every 6 hours. Receiving Juven BID via tube or drinking it sometimes. Will change TF to Glucerna 1.5 (contains fiber and may help improve stool consistency).   Given ongoing minimal oral intake, plans for PEG placement next week. Can transition to bolus feedings after PEG placed.   Patient continues to receive hydrotherapy to sacral wound.   Labs reviewed: Na 133 CBG: [phone]  Medications reviewed and include Novolog, Levemir, Juven, Protonix.  Diet Order:   Diet Order             DIET - DYS 1 Room service appropriate? Yes with Assist; Fluid consistency: Thin  Diet effective now                   EDUCATION NEEDS:   Not appropriate for education at this time  Skin:  Skin Assessment: Skin Integrity Issues: Skin Integrity Issues:: Unstageable DTI: perineum Unstageable: sacrococcygeal: seen by WOC  Last BM:  6/7 type 7  Height:   Ht Readings from Last 1 Encounters:  03/24/22 _0  (1.448 m)    Weight:   Wt Readings from Last 1 Encounters:  04/21/22 62.1 kg    BMI:  Body mass index is 29.65 kg/m.  Estimated Nutritional Needs:   Kcal:  1900-2100 kcals  Protein:  110-130 g  Fluid:  >/= 1.9 L   Lucas Mallow RD, LDN, CNSC Please refer to Amion for  contact information.

## 2022-04-21 NOTE — Progress Notes (Addendum)
  Progress Note    04/21/2022 9:22 AM 31 Days Post-Op  Subjective:  more alert this morning, denies any pain   Vitals:   04/21/22 0435 04/21/22 0915  BP: 129/75 133/86  Pulse: (!) 122 (!) 113  Resp: 20   Temp: 98 F (36.7 C)   SpO2: 95%    Physical Exam: Cardiac:  regular Lungs:  non labored Incisions:  abd incision and b groin incisions are healing well Extremities:  well perfused and warm, edematous Abdomen:  distended, non tender Neurologic: alert and oriented  CBC    Component Value Date/Time   WBC 16.3 (H) 04/21/2022 0506   RBC 3.10 (L) 04/21/2022 0506   HGB 8.7 (L) 04/21/2022 0506   HGB 14.5 03/22/2021 0916   HCT 27.7 (L) 04/21/2022 0506   HCT 43.9 03/22/2021 0916   PLT 271 04/21/2022 0506   PLT 261 03/22/2021 0916   MCV 89.4 04/21/2022 0506   MCV 90 03/22/2021 0916   MCH 28.1 04/21/2022 0506   MCHC 31.4 04/21/2022 0506   RDW 15.0 04/21/2022 0506   RDW 12.5 03/22/2021 0916   LYMPHSABS 3.9 04/20/2022 0600   MONOABS 0.7 04/20/2022 0600   EOSABS 0.1 04/20/2022 0600   BASOSABS 0.0 04/20/2022 0600    BMET    Component Value Date/Time   NA 133 (L) 04/21/2022 0506   NA 137 03/22/2021 0916   K 4.1 04/21/2022 0506   CL 105 04/21/2022 0506   CO2 24 04/21/2022 0506   GLUCOSE 318 (H) 04/21/2022 0506   BUN 23 04/21/2022 0506   BUN 11 03/22/2021 0916   CREATININE 0.66 04/21/2022 0506   CREATININE 0.74 11/11/2015 1921   CALCIUM 8.4 (L) 04/21/2022 0506   GFRNONAA >60 04/21/2022 0506   GFRNONAA >89 11/11/2015 1921   GFRAA 78 10/23/2020 1701   GFRAA >89 11/11/2015 1921    INR    Component Value Date/Time   INR 1.1 03/27/2022 0510     Intake/Output Summary (Last 24 hours) at 04/21/2022 6808 Last data filed at 04/21/2022 0043 Gross per 24 hour  Intake 650 ml  Output 2250 ml  Net -1600 ml     Assessment/Plan:  66 y.o. female is s/p aortobifemoral bypass 31 Days Post op  Little more alert this morning. Still not very conversational Bilateral lower  extremities remain well perfused and warm Incisions healing well WBC slightly higher today 16.3. Remains on zosyn  Remains on dysphagia 1 diet. Minimal PO intake. Continue Night time Feeds. Plan for PEG tube next week Appreciate TRH and therapy services  for management of patient Continue aggressive therapy  [facility] [phone] 04/21/2022 9:22 AM  Base of the midline laparotomy incision with purulence, opened, superficial.  Wet-to-dry packing placed, this will be need to change daily General surgery to see today, Dr. Bobbye Morton, appreciate her involvement for G-tube placement Overall, I think Thelma continues to improve slowly. Aggressive therapy Recommend oral broad-spectrum antibiotics due to known retroperitoneal hematoma.  No plan for drainage at this time as I do not want to introduce a nidus for infection Appreciate Dr. Lisbeth Ply involvement   Broadus John

## 2022-04-21 NOTE — Progress Notes (Signed)
Occupational Therapy Treatment Patient Details Name: Gabrielle [name] MRN: [id number] DOB: [birth year] Today's Date: 04/21/2022   History of present illness Pt is a 66 y.o. female admitted 03/21/22 for open aortobifemoral bypass and bilateral femoral endarterectomy, complicated by MI, HF, shock. ETT 5/10-5/20. Pt with waxing/waning mentation; head CT 5/26 negative for acute abnormality. Pt also with sacral wound and hydrotherapy following since 04/04/22. PMH includes rectal CA, DM2, HTN.   OT comments  Patient received in bed and appeared lethargic with husband stating it may be due to pain meds. Patient was transferred to recliner with [facility] lift.  Patient performed standing from recliner into stedy with +2 max assist using bed pads to assist with raising bottom. Patient often stood in trunk flexed position and required physical assistance and verbal cues to correct. Patient left in recliner, positioned to allow for pressure relief. Acute OT to continue to follow.    Recommendations for follow up therapy are one component of a multi-disciplinary discharge planning process, led by the attending physician.  Recommendations may be updated based on patient status, additional functional criteria and insurance authorization.    Follow Up Recommendations  Skilled nursing-short term rehab (<3 hours/day)    Assistance Recommended at Discharge Frequent or constant Supervision/Assistance  Patient can return home with the following  Two people to help with walking and/or transfers;Two people to help with bathing/dressing/bathroom;Direct supervision/assist for medications management;Direct supervision/assist for financial management;Assist for transportation;Help with stairs or ramp for entrance   Equipment Recommendations  Other (comment) (TBD)    Recommendations for Other Services      Precautions / Restrictions Precautions Precautions: Fall;Other (comment) Precaution Comments: cortrak (PM feeds only),  flexiseal (falls out easily), sacral wound; RUE restricted/no BP;  L brachial BP painful try wrist Restrictions Weight Bearing Restrictions: No       Mobility Bed Mobility Overal bed mobility: Needs Assistance Bed Mobility: Rolling Rolling: Total assist, +2 for physical assistance, Max assist         General bed mobility comments: Pt able to reach with assistance to L bed rail to roll to L, needing maxA. TAx2 to roll to R due to pt resisting and trying to grab L rails due to sacral pain.    Transfers Overall transfer level: Needs assistance Equipment used: Ambulation equipment used Transfers: Sit to/from Stand, Bed to chair/wheelchair/BSC Sit to Stand: Max assist, +2 physical assistance, +2 safety/equipment           General transfer comment: patient was transferred from bed to recliner with maxisky and stood in stedy x6 from flaps with max assist +2 Transfer via Lift Equipment: Maxisky, Stedy   Balance Overall balance assessment: Needs assistance Sitting-balance support: Bilateral upper extremity supported Sitting balance-Leahy Scale: Poor Sitting balance - Comments: Leaning posteriorly and to L, apparently to offload sacral wound Postural control: Left lateral lean, Posterior lean Standing balance support: Bilateral upper extremity supported Standing balance-Leahy Scale: Zero Standing balance comment: MaxAx2 and bil UE support to stand 7x in stedy                           ADL either performed or assessed with clinical judgement   ADL                                              Extremity/Trunk Assessment  Vision       Perception     Praxis      Cognition Arousal/Alertness: Lethargic (more awake once OOB) Behavior During Therapy: Flat affect, Anxious Overall Cognitive Status: Difficult to assess Area of Impairment: Attention, Following commands, Safety/judgement, Problem solving, Awareness                    Current Attention Level: Focused   Following Commands: Follows one step commands inconsistently, Follows one step commands with increased time Safety/Judgement: Decreased awareness of safety, Decreased awareness of deficits Awareness: Intellectual Problem Solving: Slow processing, Decreased initiation, Difficulty sequencing, Requires verbal cues, Requires tactile cues General Comments: Pt lethargic in supine, falling asleep often, potentially due to receiving pain meds recently per husband. Pt more alert and awake once OOB in chair. Pt inconsistent in following simple cues, resisting often, appearing to be likely due to pain at buttocks from wound. Needs extensive multi-modal simple cues and extra time to respond. Minimally verbal today, stating "yeah" and "it's alright" on occasion, but otherwise did not speak, appearing to be potentially her choice to not speak to therapists.        Exercises      Shoulder Instructions       General Comments      Pertinent Vitals/ Pain       Pain Assessment Pain Assessment: Faces Faces Pain Scale: Hurts even more Pain Location: sacrum, feet with touch Pain Descriptors / Indicators: Restless, Grimacing, Guarding Pain Intervention(s): Limited activity within patient's tolerance, Monitored during session, Premedicated before session, Repositioned  Home Living                                          Prior Functioning/Environment              Frequency  Min 2X/week        Progress Toward Goals  OT Goals(current goals can now be found in the care plan section)  Progress towards OT goals: Progressing toward goals  Acute Rehab OT Goals OT Goal Formulation: Patient unable to participate in goal setting Time For Goal Achievement: 05/02/22 Potential to Achieve Goals: Fair ADL Goals Pt Will Perform Grooming: with mod assist;sitting Pt Will Perform Upper Body Dressing: with max assist;sitting Pt Will Perform  Lower Body Dressing: with max assist;sitting/lateral leans Pt Will Transfer to Toilet: with mod assist;with +2 assist;bedside commode Pt Will Perform Toileting - Clothing Manipulation and hygiene: with mod assist;with 2+ total assist;sit to/from stand Pt/caregiver will Perform Home Exercise Program: Both right and left upper extremity;With minimal assist;With written HEP provided Additional ADL Goal #1: Pt will perform bed mobility at max A level to come EOB prior to engaging in ADL  Plan Discharge plan remains appropriate    Co-evaluation    PT/OT/SLP Co-Evaluation/Treatment: Yes Reason for Co-Treatment: Complexity of the patient's impairments (multi-system involvement);Necessary to address cognition/behavior during functional activity;For patient/therapist safety;To address functional/ADL transfers PT goals addressed during session: Mobility/safety with mobility;Balance;Proper use of DME;Strengthening/ROM OT goals addressed during session: Strengthening/ROM      AM-PAC OT "6 Clicks" Daily Activity     Outcome Measure   Help from another person eating meals?: Total Help from another person taking care of personal grooming?: Total Help from another person toileting, which includes using toliet, bedpan, or urinal?: Total Help from another person bathing (including washing, rinsing, drying)?: Total Help from another person to  put on and taking off regular upper body clothing?: Total Help from another person to put on and taking off regular lower body clothing?: Total 6 Click Score: 6    End of Session Equipment Utilized During Treatment: Gait belt;Other (comment) Charlaine Dalton)  OT Visit Diagnosis: Unsteadiness on feet (R26.81);Other abnormalities of gait and mobility (R26.89);Muscle weakness (generalized) (M62.81);Other symptoms and signs involving cognitive function   Activity Tolerance Patient tolerated treatment well   Patient Left in chair;with call bell/phone within reach;with chair  alarm set;with family/visitor present   Nurse Communication Mobility status;Need for lift equipment        Time: 1229-1305 OT Time Calculation (min): 36 min  Charges: OT General Charges $OT Visit: 1 Visit OT Treatments $Therapeutic Activity: 8-22 mins  Lodema Hong, OTA Acute Rehabilitation Services  Pager [phone] Office [place] 04/21/2022, 2:59 PM

## 2022-04-21 NOTE — Progress Notes (Signed)
PROGRESS NOTE    Gabrielle [name]  IDP:[id number] DOB: 1956/06/17 DOA: 03/21/2022 PCP: Dorna Mai, MD   Brief Narrative: Gabrielle [name] is a 66 y.o. female with PMH significant for DM2, HTN, HLD, PAD rectal cancer. 5/8, patient was admitted under vascular surgery on for critical limb ischemia and underwent emergency aortobifemoral bypass Postoperatively she was monitored in ICU. 5/10 - 5/20, intubated and mechanically ventilated. Course was complicated by shock, stress-induced cardiomyopathy, persistent leukocytosis, paralytic ileus requiring TPN. Ileus subsequently resolved and started on oral diet. 6/2, transferred out off ICU. Gabrielle [name] service consulted for continued medical management.   Assessment and Plan:  Postoperative cardiogenic shock Acute systolic heart failure Patient required ICU admission and initiation of vasopressin and norepinephrine for management of shock. Cardiology consulted. Serial Transthoracic Echocardiogram significant for EF of 55-60% > 20-25% > 55-60%. Patient diuresed with improvement of clinical status. Shock resolved and vasopressors weaned off. Resolved.  Retroperitoneal hematoma Possible contributed to above problem. Large hematoma measuring 11  x 11.2 x 15.4 cm noted on CT abdomen/pelvis on 6/6. Discussion with vascular surgery with recommendations for antibiotic coverage -Resume Zosyn IV with plan for long term suppressive therapy until hematoma resolves  Acute respiratory failure with hypoxia Patient required ICU admission and intubation for respiratory distress. Patient successfully extubated. Weaned to room air. Resolved.  Critical limb ischemia PAD Hyperlipidemia Patient initially admitted for aortobifemoral bypass by vascular surgery. Currently stable. -Continue aspirin 81 mg daily and Crestor 40 mg daily  Complicated UTI Associated fevers. Urine culture (6/3) significant for E. Coli bacteruria. Patient empirically treated with Zosyn which  has transitioned to Keflex. Blood cultures (6/2) with no growth. -Continue 7 day course of antibiotics -Discontinue Keflex, restart Zosyn IV for coverage of above  Acute metabolic encephalopathy In setting of ICU admission/shock/UTI. Patient mental status appears to be improved.  Diabetes mellitus, type 2 Uncontrolled with hyperglycemia. Patient is on Januvia, metformin and Wilder Glade as an outpatient. Januvia and [place] held on admission. Started on insulin this admission. Issues with control secondary to tube feeds. Tradjenta started while inpatient. Blood sugar uncontrolled. -Continue Levemir 25 units Novolog 3 units TID with meals and SSI q4 hours -Add Novolog 4 units q4 hours overnight while on tube feeds -Discontinue metformin and Tradjenta while inpatient  Acute diarrhea In setting of tube feedings. Started on imodium.  Severe malnutrition Dietitian consulted. Patient with a Cortrak NG tube on tube feeds. Received TPN which is now discontinued. Now with plans for PEG tube placement. Complicated by dysphagia. -Dietitian recommendations (6/4): Change to nocturnal tube feeding via Cortrak:  Vital 1.5 at 70 ml/hr x 14 hours (980 ml total) Pro-Source TF 90 mL BID Provides 1630 kcals (85% of estimated needs), 110 g of protein (100% of minimum estimated needs), 749 mL of free water. Free water of 100 mL q 6 hours with TF provides 1148 mL of free water. Continue Juven BID per tube, each packet provides 80 calories, 8 grams of carbohydrate, 2.5  grams of protein (collagen), 7 grams of L-arginine and 7 grams of L-glutamine; supplement contains CaHMB, Vitamins C, E, B12 and Zinc to promote wound healing. Continue Magic cup TID with meals, each supplement provides 290 kcal and 9 grams of protein. Continue Ensure Plus High Protein po TID, each supplement provides 350 kcal and 20 grams of protein. Feeding assistance at meal times and with supplements. -PEG tube per general surgery  Sacral  ulcer Unstageable. Not present on admission. Wound care consulted. Receiving hydrotherapy.  DVT prophylaxis: Heparin subq Code Status:   Code Status: Full Code Family Communication: Husband and friend at bedside Disposition Plan: Discharge to SNF pending PEG tube placement, specialist recommendations and bed availability   Consultants:  Cardiology PCCM Vascular surgery  Procedures:  Transthoracic Echocardiogram (5/9) Intubation (5/10 >> 5/20) Limited Transthoracic Echocardiogram (5/10) Cortrak placement (5/17) Limited Transthoracic Echocardiogram (5/21)  Antimicrobials: Zosyn Cephalexin   Subjective: No issues today although she does not like her rectal tube.  Objective: BP 120/73 (BP Location: Left Arm)   Pulse (!) 102   Temp (!) 97.5 F (36.4 C) (Axillary)   Resp 18   Ht '4\' 9"'$  (1.448 m)   Wt 62.1 kg   SpO2 97%   BMI 29.65 kg/m   Examination:  General exam: Appears calm and comfortable Respiratory system: Clear to auscultation. Respiratory effort normal. Cardiovascular system: S1 & S2 heard, RRR. Gastrointestinal system: Abdomen is distended, soft and nontender. Normal bowel sounds heard. Central nervous system: Alert. Musculoskeletal: No calf tenderness Skin: No cyanosis. No rashes Psychiatry: Judgement and insight appear normal. Mood & affect appropriate.    Data Reviewed: I have personally reviewed following labs and imaging studies  CBC Lab Results  Component Value Date   WBC 16.3 (H) 04/21/2022   RBC 3.10 (L) 04/21/2022   HGB 8.7 (L) 04/21/2022   HCT 27.7 (L) 04/21/2022   MCV 89.4 04/21/2022   MCH 28.1 04/21/2022   PLT 271 04/21/2022   MCHC 31.4 04/21/2022   RDW 15.0 04/21/2022   LYMPHSABS 3.9 04/20/2022   MONOABS 0.7 04/20/2022   EOSABS 0.1 04/20/2022   BASOSABS 0.0 00/86/7619     Last metabolic panel Lab Results  Component Value Date   NA 133 (L) 04/21/2022   K 4.1 04/21/2022   CL 105 04/21/2022   CO2 24 04/21/2022   BUN 23  04/21/2022   CREATININE 0.66 04/21/2022   GLUCOSE 318 (H) 04/21/2022   GFRNONAA >60 04/21/2022   GFRAA 78 10/23/2020   CALCIUM 8.4 (L) 04/21/2022   PHOS 3.0 04/08/2022   PROT 6.6 04/21/2022   ALBUMIN 1.9 (L) 04/21/2022   LABGLOB 3.7 03/22/2021   AGRATIO 1.2 03/22/2021   BILITOT 0.3 04/21/2022   ALKPHOS 73 04/21/2022   AST 58 (H) 04/21/2022   ALT 98 (H) 04/21/2022   ANIONGAP 4 (L) 04/21/2022    GFR: Estimated Creatinine Clearance: 53.1 mL/min (by C-G formula based on SCr of 0.66 mg/dL).  Recent Results (from the past 240 hour(s))  Culture, blood (Routine X 2) w Reflex to ID Panel     Status: None   Collection Time: 04/15/22  7:33 PM   Specimen: BLOOD  Result Value Ref Range Status   Specimen Description BLOOD BLOOD LEFT ARM  Final   Special Requests   Final    BOTTLES DRAWN AEROBIC AND ANAEROBIC Blood Culture adequate volume   Culture   Final    NO GROWTH 5 DAYS Performed at [name], [address] [place],  [address]    Report Status 04/20/2022 FINAL  Final  Culture, blood (Routine X 2) w Reflex to ID Panel     Status: None   Collection Time: 04/15/22  7:36 PM   Specimen: BLOOD  Result Value Ref Range Status   Specimen Description BLOOD BLOOD LEFT FOREARM  Final   Special Requests   Final    BOTTLES DRAWN AEROBIC ONLY Blood Culture adequate volume   Culture   Final    NO GROWTH 5 DAYS  Performed at [name], [place] [address] [place], [place] [address]    Report Status 04/20/2022 FINAL  Final  Urine Culture     Status: Abnormal   Collection Time: 04/16/22  7:54 AM   Specimen: Urine, Catheterized  Result Value Ref Range Status   Specimen Description URINE, CATHETERIZED  Final   Special Requests   Final    NONE Performed at [name], [place] [address], [place], [place] [address]    Culture >=100,000 COLONIES/mL ESCHERICHIA COLI (A)  Final   Report Status 04/18/2022 FINAL  Final   Organism ID, Bacteria ESCHERICHIA COLI (A)  Final       Susceptibility   Escherichia coli - MIC*    AMPICILLIN >=32 RESISTANT Resistant     CEFAZOLIN 8 SENSITIVE Sensitive     CEFEPIME <=0.12 SENSITIVE Sensitive     CEFTRIAXONE <=0.25 SENSITIVE Sensitive     CIPROFLOXACIN >=4 RESISTANT Resistant     GENTAMICIN <=1 SENSITIVE Sensitive     IMIPENEM <=0.25 SENSITIVE Sensitive     NITROFURANTOIN <=16 SENSITIVE Sensitive     TRIMETH/SULFA <=20 SENSITIVE Sensitive     AMPICILLIN/SULBACTAM >=32 RESISTANT Resistant     PIP/TAZO <=4 SENSITIVE Sensitive     * >=100,000 COLONIES/mL ESCHERICHIA COLI      Radiology Studies: No results found.    LOS: 31 days    Cordelia Poche, MD [facility] 04/21/2022, 3:48 PM   If 7PM-7AM, please contact night-coverage [email or web address]

## 2022-04-21 NOTE — Progress Notes (Signed)
Inpatient Diabetes Program Recommendations  AACE/ADA: New Consensus Statement on Inpatient Glycemic Control (2015)  Target Ranges:  Prepandial:   less than 140 mg/dL      Peak postprandial:   less than 180 mg/dL (1-2 hours)      Critically ill patients:  140 - 180 mg/dL   Lab Results  Component Value Date   GLUCAP 311 (H) 04/21/2022   HGBA1C 9.2 (H) 03/14/2022    Review of Glycemic Control  Latest Reference Range & Units 04/20/22 20:32 04/21/22 00:21 04/21/22 04:44 04/21/22 07:57  Glucose-Capillary 70 - 99 mg/dL 192 (H) 241 (H) 307 (H) 311 (H)  (H): Data is abnormally high Diabetes history: Type 2 DM Outpatient Diabetes medications: Farxiga 10 mg daily, Metformin 1000 mg BID, Rybelsus 14 mg daily, Januvia 25 mg daily Current orders for Inpatient glycemic control: Levemir 25 units BID, Novolog 3 units TID, Novolog 0-15 units Q4H, Tradjenta 5 mg QD, Metformin 1000 mg BID; Vital @ 50 ml/hr     Inpatient Diabetes Program Recommendations:     Discussed with nursing staff this AM, patient did not eat >50% and is not consistently.  Instead, could consider adding Novolog 3 units for nocturnal tube feeds (to be stopped or held in the event tube feeds are stopped; to be scheduled at 1800, 2200, 0200, 0600).    Thanks, Bronson Curb, MSN, RNC-OB Diabetes Coordinator [phone] (8a-5p)

## 2022-04-21 NOTE — TOC CAGE-AID Note (Signed)
Transition of Care [facility]) - CAGE-AID Screening   Patient Details  Name: Gabrielle [name] MRN: [id number] Date of Birth: 25-Mar-1956  Transition [facility]) CM/SW Contact:    Ranay Ketter C Tarpley-Carter, LCSWA Phone Number: 04/21/2022, 10:06 AM   Clinical Narrative: Pt participated in [place].  Pt stated she does not use substance or ETOH.  Pt was not offered resources, due to no usage of substance or ETOH.     Leanard Dimaio Tarpley-Carter, MSW, LCSW-A Pronouns:  She/Her/Hers [facility] Clinical Social Worker Direct Number:  [phone] [email or web address]  CAGE-AID Screening:    Have You Ever Felt You Ought to Cut Down on Your Drinking or Drug Use?: No Have People Annoyed You By [employer] Your Drinking Or Drug Use?: No Have You Felt Bad Or Guilty About Your Drinking Or Drug Use?: No Have You Ever Had a Drink or Used Drugs First Thing In The Morning to Steady Your Nerves or to Get Rid of a Hangover?: No CAGE-AID Score: 0  Substance Abuse Education Offered: No

## 2022-04-21 NOTE — Progress Notes (Signed)
Our team was asked to see the patient for consideration of gastrostomy tube placement. Please see my colleagues note from earlier today for full consult note.   I met with the patient and her husband at bedside. I discussed the indications, risks and aftercare of gastrostomy tube placement. Discussed we will try to do a percutaneous endoscopic gastrostomy first and if unable would convert to laparoscopic vs open gastrostomy tube placement. Risks include but are not limited to anesthesia (MI, CVA, death, aspiration), bleeding, infection, scarring, pain, leakage around the tube, skin irritation, tube dislodgement and wound issues. We discussed the gastrostomy tube will need to stay in place for at least 6 weeks and the rationale for this. In addition discussed the tube may stay in place for a longer period of time based on her nutritional needs and the possibility of periodically needing exchanges/replacement for various reasons which were discussed. The patient and her husband were in agreement with the plan and wished to proceed. All questions answered. The patient was alert, asked appropriate questions and was able to tell me her name but otherwise would no specifically answer orientation questions. This correlates with her discussion with my colleague earlier today. Will ask the husband to sign the consent for surgery given this. RN in the room at the closure of the conversation and will make sure informed consent is filled out. NPO & hold tube feeds at midnight. Ancef ordered on call to OR. Patient posted for OR with Dr. Bobbye Morton for tomorrow.   Jillyn Ledger, [facility] Surgery 04/21/22, 3:07 PM

## 2022-04-21 NOTE — Progress Notes (Addendum)
Pt has been keflex for UTI. Unasyn was ordered empirically to cover for intra-abd infection. Since it doesn't have reliable coverage, we will change her to ceftriaxone/flagyl after discussing Dr Lonny Prude.    Addendum  Dr Lonny Prude stated that [facility] may prefer zosyn instead. Change abx to zosyn 3.375g IV q8  Onnie Boer, PharmD, [place], AAHIVP, CPP Infectious Disease Pharmacist 04/21/2022 3:26 PM

## 2022-04-21 NOTE — TOC Progression Note (Signed)
Transition of Care [facility]) - Progression Note    Patient Details  Name: Gabrielle [name] MRN: [id number] Date of Birth: 1956/10/14  Transition of Care [facility]) CM/SW [place], [place] Phone Number: 04/21/2022, 3:58 PM  Clinical Narrative:     CSW met with patient's spouse,[name]- CSW provided spouse with Medicare.gov SNF listings. CSW encourage [name] to review facilities, and visit if needed. CSW will follow up for  SNF choices.   TOC will continue to follow and assist with discharge planning.  [name], MSW, LCSW Clinical Social Worker    Expected Discharge Plan:  (TBD) Barriers to Discharge: Continued Medical Work up  Expected Discharge Plan and Services Expected Discharge Plan:  (TBD) In-house Referral: Clinical Social Work Discharge Planning Services: CM Consult Post Acute Care Choice: Long Term Acute Care (LTAC) Living arrangements for the past 2 months: Single Family Home                   DME Agency: NA       HH Arranged: NA           Social Determinants of Health (SDOH) Interventions    Readmission Risk Interventions     No data to display

## 2022-04-21 NOTE — Progress Notes (Signed)
Physical Therapy Treatment Patient Details Name: Gabrielle [name] MRN: [id number] DOB: [birth year] Today's Date: 04/21/2022   History of Present Illness Pt is a 66 y.o. female admitted 03/21/22 for open aortobifemoral bypass and bilateral femoral endarterectomy, complicated by MI, HF, shock. ETT 5/10-5/20. Pt with waxing/waning mentation; head CT 5/26 negative for acute abnormality. Pt also with sacral wound and hydrotherapy following since 04/04/22. PMH includes rectal CA, DM2, HTN.    PT Comments    Pt was lethargic upon arrival, potentially due to getting pain meds recently per husband. However, once dependently lifted from bed > chair using maxisky she was much more awake and participatory. Pt was able to stand x7 reps in the stedy with maxAx2, displaying weakness in her upper and lower extremities and core. Pt needing repeated cues to push up with her hands on the bar rather than her forearms to improve her upright posture, but poor compliance. Will continue to follow acutely. Current recommendations remain appropriate.    Recommendations for follow up therapy are one component of a multi-disciplinary discharge planning process, led by the attending physician.  Recommendations may be updated based on patient status, additional functional criteria and insurance authorization.  Follow Up Recommendations  Skilled nursing-short term rehab (<3 hours/day)     Assistance Recommended at Discharge Frequent or constant Supervision/Assistance  Patient can return home with the following Two people to help with walking and/or transfers;Two people to help with bathing/dressing/bathroom;Assistance with cooking/housework;Assist for transportation;Direct supervision/assist for financial management;Help with stairs or ramp for entrance;Direct supervision/assist for medications management   Equipment Recommendations  Wheelchair (measurements PT);Wheelchair cushion (measurements PT);Hospital bed;Other (comment)  (hoyer lift; petite size for WC as pt is 63f 9")    Recommendations for Other Services       Precautions / Restrictions Precautions Precautions: Fall;Other (comment) Precaution Comments: cortrak (PM feeds only), flexiseal (falls out easily), sacral wound; RUE restricted/no BP;  L brachial BP painful try wrist Restrictions Weight Bearing Restrictions: No     Mobility  Bed Mobility Overal bed mobility: Needs Assistance Bed Mobility: Rolling Rolling: Total assist, +2 for physical assistance, Max assist         General bed mobility comments: Pt able to reach with assistance to L bed rail to roll to L, needing maxA. TAx2 to roll to R due to pt resisting and trying to grab L rails due to sacral pain.    Transfers Overall transfer level: Needs assistance Equipment used: Ambulation equipment used Transfers: Sit to/from Stand, Bed to chair/wheelchair/BSC Sit to Stand: Max assist, +2 physical assistance, +2 safety/equipment           General transfer comment: Maxisky used to dependently transfer pt bed > recliner. Then performed x1 sit to stand from recliner > stedy and x6 reps from stedy flaps with maxAx2 and use of bed pads as sling for assistance at hips to reduce pressure/pain at sacral wound and reduce risk of pulling flexiseal tube. Pt tends to lean forearms on stedy bar rather than push up with hands on stedy bar despite cues Transfer via Lift Equipment: MCamelia Eng Ambulation/Gait               General Gait Details: Unable   Stairs             Wheelchair Mobility    Modified Rankin (Stroke Patients Only)       Balance Overall balance assessment: Needs assistance Sitting-balance support: Bilateral upper extremity supported Sitting balance-Leahy Scale: Poor Sitting balance -  Comments: Leaning posteriorly and to L, apparently to offload sacral wound Postural control: Left lateral lean, Posterior lean Standing balance support: Bilateral upper  extremity supported Standing balance-Leahy Scale: Zero Standing balance comment: MaxAx2 and bil UE support to stand 7x in stedy                            Cognition Arousal/Alertness: Lethargic (more awake once OOB) Behavior During Therapy: Flat affect, Anxious Overall Cognitive Status: Difficult to assess Area of Impairment: Attention, Following commands, Safety/judgement, Problem solving, Awareness                   Current Attention Level: Focused   Following Commands: Follows one step commands inconsistently, Follows one step commands with increased time Safety/Judgement: Decreased awareness of safety, Decreased awareness of deficits Awareness: Intellectual Problem Solving: Slow processing, Decreased initiation, Difficulty sequencing, Requires verbal cues, Requires tactile cues General Comments: Pt lethargic in supine, falling asleep often, potentially due to receiving pain meds recently per husband. Pt more alert and awake once OOB in chair. Pt inconsistent in following simple cues, resisting often, appearing to be likely due to pain at buttocks from wound. Needs extensive multi-modal simple cues and extra time to respond. Minimally verbal today, stating "yeah" and "it's alright" on occasion, but otherwise did not speak, appearing to be potentially her choice to not speak to therapists.        Exercises      General Comments        Pertinent Vitals/Pain Pain Assessment Pain Assessment: Faces Faces Pain Scale: Hurts even more Pain Location: sacrum, feet with touch Pain Descriptors / Indicators: Restless, Grimacing, Guarding Pain Intervention(s): Limited activity within patient's tolerance, Monitored during session, Premedicated before session, Repositioned    Home Living                          Prior Function            PT Goals (current goals can now be found in the care plan section) Acute Rehab PT Goals Patient Stated Goal: per  husband, to walk PT Goal Formulation: With patient/family Time For Goal Achievement: 04/28/22 Potential to Achieve Goals: Fair Progress towards PT goals: Progressing toward goals    Frequency    Min 2X/week      PT Plan Current plan remains appropriate    Co-evaluation PT/OT/SLP Co-Evaluation/Treatment: Yes Reason for Co-Treatment: Complexity of the patient's impairments (multi-system involvement);Necessary to address cognition/behavior during functional activity;For patient/therapist safety;To address functional/ADL transfers PT goals addressed during session: Mobility/safety with mobility;Balance;Proper use of DME;Strengthening/ROM        AM-PAC PT "6 Clicks" Mobility   Outcome Measure  Help needed turning from your back to your side while in a flat bed without using bedrails?: Total Help needed moving from lying on your back to sitting on the side of a flat bed without using bedrails?: Total Help needed moving to and from a bed to a chair (including a wheelchair)?: Total Help needed standing up from a chair using your arms (e.g., wheelchair or bedside chair)?: Total Help needed to walk in hospital room?: Total Help needed climbing 3-5 steps with a railing? : Total 6 Click Score: 6    End of Session Equipment Utilized During Treatment: Gait belt Activity Tolerance: Patient limited by lethargy;Patient limited by pain Patient left: with call bell/phone within reach;Other (comment);in chair (offloading sacrum with pillow under R  buttocks and cushion under entire buttocks) Nurse Communication: Mobility status;Need for lift equipment PT Visit Diagnosis: Muscle weakness (generalized) (M62.81);Other abnormalities of gait and mobility (R26.89);Pain;Unsteadiness on feet (R26.81)     Time: 1230-1305 PT Time Calculation (min) (ACUTE ONLY): 35 min  Charges:  $Therapeutic Activity: 8-22 mins                     Moishe Spice, PT, DPT Acute Rehabilitation Services  Office:  [phone]    Orvan Falconer 04/21/2022, 1:24 PM

## 2022-04-21 NOTE — Consult Note (Signed)
[facility] Surgery Consult Note  Gabrielle [name] July 26, 1956  [id number].    Requesting MD: Orlie Pollen Chief Complaint/Reason for Consult: PEG  HPI:  Gabrielle [name] is a 66 y.o. female DM, HTN, HLD, PAD, and h/o rectal cancer who was admitted to [facility] 03/21/22 under vascular surgery on for critical limb ischemia and underwent emergency aortobifemoral bypass by Dr. Virl Cagey. She had a complicated postoperative course requiring a prolonged ICU stay. She was intubated from 5/10 through 5/20. Course was complicated by shock, stress-induced cardiomyopathy, ileus requiring TPN. Ileus subsequently resolved and started on oral diet. Patient is tolerating PO intake but not taking in enough nutrition by mouth and continues to require nocturnal tube feedings. We are asked to see for consideration of PEG placement.  Abdominal surgical history: tubal ligation, hysterectomy, abdominoplasty, surgery for rectal cancer  Family History  Problem Relation Age of Onset   Diabetes Mother    Hypertension Mother    Diabetes Sister    Hypertension Sister    Diabetes Brother    Hypertension Brother    Lupus Daughter    Colon cancer Neg Hx    Esophageal cancer Neg Hx    Rectal cancer Neg Hx    Stomach cancer Neg Hx    Colon polyps Neg Hx     Past Medical History:  Diagnosis Date   Cancer ([place]) 2010   rectal CA   Cataract    Diabetes mellitus without complication ([place])    Phreesia 11/15/2020   Eczema    Hyperlipidemia    Hypertension    Intrinsic (urethral) sphincter deficiency (ISD)    RBBB    SUI (stress urinary incontinence, female)    hx of   Type 2 diabetes mellitus ([place])     Past Surgical History:  Procedure Laterality Date   ABDOMINAL HYSTERECTOMY  1998   fibroids, partial   ABDOMINOPLASTY  1999   ANGIOPLASTY Left 03/21/2022   Procedure: ANGIOPLASTY USING XENOSURE BIOLOGLIC PATCH (2YQM2NO);  Surgeon: Broadus John, MD;  Location: [facility];  Service: Vascular;  Laterality: Left;    AORTA - BILATERAL FEMORAL ARTERY BYPASS GRAFT N/A 03/21/2022   Procedure: AORTOBIFEMORAL BYPASS GRAFT USING HEMASHIELD GOLD GRAFT (14x46m);  Surgeon: RBroadus John MD;  [facility] [facility]  Service: Vascular;  Laterality: N/A;   BLADDER SUSPENSION N/A 03/06/2014   [facility] ;  Surgeon: JIrine Seal MD;  [facility]  Service: Urology;  Laterality: N/A;   CARDIOVASCULAR STRESS TEST  03-27-2008   NORMAL PERFUSION STUDY/  EF 54%   COLON SURGERY  2010   for rectal ca   COLONOSCOPY     ENDARTERECTOMY FEMORAL Bilateral 03/21/2022   Procedure: ENDARTERECTOMY FEMORAL BILATERAL;  Surgeon: RBroadus John MD;  [facility]  Service: Vascular;  Laterality: Bilateral;   EUS N/A 03/02/2017   Procedure: LOWER ENDOSCOPIC ULTRASOUND (EUS);  Surgeon: DMilus Banister MD;  [facility];  Service: Endoscopy;  Laterality: N/A;   EXCISION LOWER ABDOMINAL SCAR POST ABDOMINOPLASTY  01-05-2000   ORIF RIGHT ANKLE FX  12-24-2008   RIGHT ANKLE ARTHROSCOPY W/ DEBRIDEMENT AND OPEN REMOVAL HARDWARE  03-12-2010   TRANSTHORACIC ECHOCARDIOGRAM  08-05-2009   MILD LVH/  EF 60-65%   TUBAL LIGATION      Social History:  reports that she quit smoking about 15 years ago. Her smoking use included cigarettes. She has never used smokeless tobacco. She reports that she does not drink alcohol and does not use drugs.  Allergies: No Known Allergies  Medications Prior to Admission  Medication Sig Dispense Refill   amLODipine (NORVASC) 10 MG tablet Take 1 tablet (10 mg total) by mouth daily. 90 tablet 1   aspirin 81 MG tablet Take 81 mg by mouth daily.     atorvastatin (LIPITOR) 40 MG tablet Take 1 tablet (40 mg total) by mouth every morning. 90 tablet 1   dapagliflozin propanediol (FARXIGA) 10 MG TABS tablet Take 1 tablet (10 mg total) by mouth daily. 90 tablet 1   lisinopril (ZESTRIL) 10 MG tablet TAKE 1 TABLET BY MOUTH ONCE DAILY IN THE MORNING 90 tablet 1   metFORMIN (GLUCOPHAGE)  1000 MG tablet TAKE 1 TABLET BY MOUTH TWICE DAILY WITH A MEAL 180 tablet 1   Semaglutide (RYBELSUS) 14 MG TABS Take 14 mg by mouth daily. 90 tablet 1   sitaGLIPtin (JANUVIA) 25 MG tablet Take 25 mg by mouth daily.     gabapentin (NEURONTIN) 300 MG capsule Take 1 capsule (300 mg total) by mouth at bedtime. (Patient taking differently: Take 300 mg by mouth at bedtime as needed (pain).) 90 capsule 1   glucose blood (ACCU-CHEK SMARTVIEW) test strip PATIENT NEEDS OFFICE VISIT FOR ADDITIONAL REFILLS - very overdue for DM check up. 100 each 0    Prior to Admission medications   Medication Sig Start Date End Date Taking? Authorizing Provider  amLODipine (NORVASC) 10 MG tablet Take 1 tablet (10 mg total) by mouth daily. 11/26/21  Yes Dorna Mai, MD  aspirin 81 MG tablet Take 81 mg by mouth daily.   Yes [provider]  atorvastatin (LIPITOR) 40 MG tablet Take 1 tablet (40 mg total) by mouth every morning. 11/26/21  Yes Dorna Mai, MD  dapagliflozin propanediol (FARXIGA) 10 MG TABS tablet Take 1 tablet (10 mg total) by mouth daily. 11/26/21  Yes Dorna Mai, MD  lisinopril (ZESTRIL) 10 MG tablet TAKE 1 TABLET BY MOUTH ONCE DAILY IN THE MORNING 11/26/21  Yes Dorna Mai, MD  metFORMIN (GLUCOPHAGE) 1000 MG tablet TAKE 1 TABLET BY MOUTH TWICE DAILY WITH A MEAL 11/26/21  Yes Dorna Mai, MD  Semaglutide (RYBELSUS) 14 MG TABS Take 14 mg by mouth daily. 11/26/21  Yes Dorna Mai, MD  sitaGLIPtin (JANUVIA) 25 MG tablet Take 25 mg by mouth daily.   Yes [provider]  gabapentin (NEURONTIN) 300 MG capsule Take 1 capsule (300 mg total) by mouth at bedtime. Patient taking differently: Take 300 mg by mouth at bedtime as needed (pain). 11/26/21   Dorna Mai, MD  glucose blood (ACCU-CHEK SMARTVIEW) test strip PATIENT NEEDS OFFICE VISIT FOR ADDITIONAL REFILLS - very overdue for DM check up. 11/11/15   Ivar Drape D, PA    Blood pressure 133/86, pulse (!) 113, temperature 98  F (36.7 C), temperature source Oral, resp. rate 20, height '4\' 9"'$  (1.448 m), weight 62.1 kg, SpO2 95 %. Physical Exam: General: Alert, NAD HEENT: head is normocephalic, atraumatic.  Sclera are noninjected.  Pupils equal and round.  Ears and nose without any masses or lesions.  Mouth is pink and moist.  Heart: tachycardic HR 110s, regular rhythm.  Extremities well perfused Lungs: CTAB, no wheezes, rhonchi, or rales noted.  Respiratory effort nonlabored on room air Abd: midline incision with small eschar at proximal aspect and small opening with serosanguinous drainage at distal aspect, soft, mild distension, nontender, +BS, no masses, hernias, or organomegaly MS: calves soft and nontender Skin: warm and dry with no masses, lesions, or rashes Psych: Alert, will  not specifically answer my orientation questions but asks appropriate questions Neuro: MAEs, no gross motor or sensory deficits BUE/BLE  Results for orders placed or performed during the hospital encounter of 03/21/22 (from the past 48 hour(s))  Glucose, capillary     Status: Abnormal   Collection Time: 04/19/22 11:28 AM  Result Value Ref Range   Glucose-Capillary 351 (H) 70 - 99 mg/dL    Comment: Glucose reference range applies only to samples taken after fasting for at least 8 hours.  Glucose, capillary     Status: Abnormal   Collection Time: 04/19/22  4:17 PM  Result Value Ref Range   Glucose-Capillary 122 (H) 70 - 99 mg/dL    Comment: Glucose reference range applies only to samples taken after fasting for at least 8 hours.   Comment 1 Notify RN   Glucose, capillary     Status: Abnormal   Collection Time: 04/19/22  6:52 PM  Result Value Ref Range   Glucose-Capillary 142 (H) 70 - 99 mg/dL    Comment: Glucose reference range applies only to samples taken after fasting for at least 8 hours.  Glucose, capillary     Status: Abnormal   Collection Time: 04/19/22  8:08 PM  Result Value Ref Range   Glucose-Capillary 135 (H) 70 - 99  mg/dL    Comment: Glucose reference range applies only to samples taken after fasting for at least 8 hours.   Comment 1 Notify RN    Comment 2 Document in Chart   Glucose, capillary     Status: Abnormal   Collection Time: 04/20/22 12:27 AM  Result Value Ref Range   Glucose-Capillary 138 (H) 70 - 99 mg/dL    Comment: Glucose reference range applies only to samples taken after fasting for at least 8 hours.   Comment 1 Notify RN    Comment 2 Document in Chart   Glucose, capillary     Status: Abnormal   Collection Time: 04/20/22  4:40 AM  Result Value Ref Range   Glucose-Capillary 156 (H) 70 - 99 mg/dL    Comment: Glucose reference range applies only to samples taken after fasting for at least 8 hours.   Comment 1 Notify RN    Comment 2 Document in Chart   APTT     Status: Abnormal   Collection Time: 04/20/22  6:00 AM  Result Value Ref Range   aPTT 22 (L) 24 - 36 seconds    Comment: Performed at [facility], [place] [address] [place], [place] [address]  CBC with Differential/Platelet     Status: Abnormal   Collection Time: 04/20/22  6:00 AM  Result Value Ref Range   WBC 15.1 (H) 4.0 - 10.5 K/uL   RBC 3.24 (L) 3.87 - 5.11 MIL/uL   Hemoglobin 9.1 (L) 12.0 - 15.0 g/dL   HCT 29.2 (L) 36.0 - 46.0 %   MCV 90.1 80.0 - 100.0 fL   MCH 28.1 26.0 - 34.0 pg   MCHC 31.2 30.0 - 36.0 g/dL   RDW 15.0 11.5 - 15.5 %   Platelets 281 150 - 400 K/uL   nRBC 0.0 0.0 - 0.2 %   Neutrophils Relative % 67 %   Neutro Abs 10.2 (H) 1.7 - 7.7 K/uL   Lymphocytes Relative 26 %   Lymphs Abs 3.9 0.7 - 4.0 K/uL   Monocytes Relative 5 %   Monocytes Absolute 0.7 0.1 - 1.0 K/uL   Eosinophils Relative 1 %   Eosinophils Absolute 0.1  0.0 - 0.5 K/uL   Basophils Relative 0 %   Basophils Absolute 0.0 0.0 - 0.1 K/uL   Immature Granulocytes 1 %   Abs Immature Granulocytes 0.09 (H) 0.00 - 0.07 K/uL    Comment: Performed at [facility] [address] [place], [place] [address]  Basic metabolic panel      Status: Abnormal   Collection Time: 04/20/22  6:00 AM  Result Value Ref Range   Sodium 137 135 - 145 mmol/L   Potassium 3.9 3.5 - 5.1 mmol/L   Chloride 106 98 - 111 mmol/L   CO2 23 22 - 32 mmol/L   Glucose, Bld 202 (H) 70 - 99 mg/dL    Comment: Glucose reference range applies only to samples taken after fasting for at least 8 hours.   BUN 23 8 - 23 mg/dL   Creatinine, Ser 0.65 0.44 - 1.00 mg/dL   Calcium 8.7 (L) 8.9 - 10.3 mg/dL   GFR, Estimated >60 >60 mL/min    Comment: (NOTE) Calculated using the CKD-EPI Creatinine Equation (2021)    Anion gap 8 5 - 15    Comment: Performed at [facility] [address], [place], [place] [address]  Glucose, capillary     Status: Abnormal   Collection Time: 04/20/22  7:44 AM  Result Value Ref Range   Glucose-Capillary 242 (H) 70 - 99 mg/dL    Comment: Glucose reference range applies only to samples taken after fasting for at least 8 hours.  Glucose, capillary     Status: Abnormal   Collection Time: 04/20/22 11:41 AM  Result Value Ref Range   Glucose-Capillary 322 (H) 70 - 99 mg/dL    Comment: Glucose reference range applies only to samples taken after fasting for at least 8 hours.  Glucose, capillary     Status: Abnormal   Collection Time: 04/20/22  4:43 PM  Result Value Ref Range   Glucose-Capillary 233 (H) 70 - 99 mg/dL    Comment: Glucose reference range applies only to samples taken after fasting for at least 8 hours.  Glucose, capillary     Status: Abnormal   Collection Time: 04/20/22  6:56 PM  Result Value Ref Range   Glucose-Capillary 186 (H) 70 - 99 mg/dL    Comment: Glucose reference range applies only to samples taken after fasting for at least 8 hours.  Glucose, capillary     Status: Abnormal   Collection Time: 04/20/22  8:32 PM  Result Value Ref Range   Glucose-Capillary 192 (H) 70 - 99 mg/dL    Comment: Glucose reference range applies only to samples taken after fasting for at least 8 hours.   Comment 1 Notify RN     Comment 2 Document in Chart   Glucose, capillary     Status: Abnormal   Collection Time: 04/21/22 12:21 AM  Result Value Ref Range   Glucose-Capillary 241 (H) 70 - 99 mg/dL    Comment: Glucose reference range applies only to samples taken after fasting for at least 8 hours.   Comment 1 Document in Chart   Glucose, capillary     Status: Abnormal   Collection Time: 04/21/22  4:44 AM  Result Value Ref Range   Glucose-Capillary 307 (H) 70 - 99 mg/dL    Comment: Glucose reference range applies only to samples taken after fasting for at least 8 hours.   Comment 1 Notify RN    Comment 2 Document in Chart   APTT  Status: None   Collection Time: 04/21/22  5:06 AM  Result Value Ref Range   aPTT 33 24 - 36 seconds    Comment: Performed at [facility] [address], [place], [place] [address]  CBC     Status: Abnormal   Collection Time: 04/21/22  5:06 AM  Result Value Ref Range   WBC 16.3 (H) 4.0 - 10.5 K/uL   RBC 3.10 (L) 3.87 - 5.11 MIL/uL   Hemoglobin 8.7 (L) 12.0 - 15.0 g/dL   HCT 27.7 (L) 36.0 - 46.0 %   MCV 89.4 80.0 - 100.0 fL   MCH 28.1 26.0 - 34.0 pg   MCHC 31.4 30.0 - 36.0 g/dL   RDW 15.0 11.5 - 15.5 %   Platelets 271 150 - 400 K/uL   nRBC 0.0 0.0 - 0.2 %    Comment: Performed at [facility], [place] [address] [place], [place] [address]  Comprehensive metabolic panel     Status: Abnormal   Collection Time: 04/21/22  5:06 AM  Result Value Ref Range   Sodium 133 (L) 135 - 145 mmol/L   Potassium 4.1 3.5 - 5.1 mmol/L   Chloride 105 98 - 111 mmol/L   CO2 24 22 - 32 mmol/L   Glucose, Bld 318 (H) 70 - 99 mg/dL    Comment: Glucose reference range applies only to samples taken after fasting for at least 8 hours.   BUN 23 8 - 23 mg/dL   Creatinine, Ser 0.66 0.44 - 1.00 mg/dL   Calcium 8.4 (L) 8.9 - 10.3 mg/dL   Total Protein 6.6 6.5 - 8.1 g/dL   Albumin 1.9 (L) 3.5 - 5.0 g/dL   AST 58 (H) 15 - 41 U/L   ALT 98 (H) 0 - 44 U/L   Alkaline Phosphatase 73 38 - 126 U/L    Total Bilirubin 0.3 0.3 - 1.2 mg/dL   GFR, Estimated >60 >60 mL/min    Comment: (NOTE) Calculated using the CKD-EPI Creatinine Equation (2021)    Anion gap 4 (L) 5 - 15    Comment: Performed at [facility], [place] [address] [place], [place] [address]  Procalcitonin - Baseline     Status: None   Collection Time: 04/21/22  5:06 AM  Result Value Ref Range   Procalcitonin 0.22 ng/mL    Comment:        Interpretation: PCT (Procalcitonin) <= 0.5 ng/mL: Systemic infection (sepsis) is not likely. Local bacterial infection is possible. (NOTE)       Sepsis PCT Algorithm           Lower Respiratory Tract                                      Infection PCT Algorithm    ----------------------------     ----------------------------         PCT < 0.25 ng/mL                PCT < 0.10 ng/mL          Strongly encourage             Strongly discourage   discontinuation of antibiotics    initiation of antibiotics    ----------------------------     -----------------------------       PCT 0.25 - 0.50 ng/mL  PCT 0.10 - 0.25 ng/mL               OR       >80% decrease in PCT            Discourage initiation of                                            antibiotics      Encourage discontinuation           of antibiotics    ----------------------------     -----------------------------         PCT >= 0.50 ng/mL              PCT 0.26 - 0.50 ng/mL               AND        <80% decrease in PCT             Encourage initiation of                                             antibiotics       Encourage continuation           of antibiotics    ----------------------------     -----------------------------        PCT >= 0.50 ng/mL                  PCT > 0.50 ng/mL               AND         increase in PCT                  Strongly encourage                                      initiation of antibiotics    Strongly encourage escalation           of antibiotics                                      -----------------------------                                           PCT <= 0.25 ng/mL                                                 OR                                        > 80% decrease in PCT  Discontinue / Do not initiate                                             antibiotics  Performed at [facility], [place] [address], [place], [place] [address]   Glucose, capillary     Status: Abnormal   Collection Time: 04/21/22  7:57 AM  Result Value Ref Range   Glucose-Capillary 311 (H) 70 - 99 mg/dL    Comment: Glucose reference range applies only to samples taken after fasting for at least 8 hours.   CT ABDOMEN PELVIS WO CONTRAST  Result Date: 04/19/2022 CLINICAL DATA:  anatomy evaluation for g-tube placement in IR EXAM: CT ABDOMEN AND PELVIS WITHOUT CONTRAST TECHNIQUE: Multidetector CT imaging of the abdomen and pelvis was performed following the standard protocol without IV contrast. RADIATION DOSE REDUCTION: This exam was performed according to the departmental dose-optimization program which includes automated exposure control, adjustment of the mA and/or kV according to patient size and/or use of iterative reconstruction technique. COMPARISON:  January 13, 2022 FINDINGS: Lower chest: There is bibasilar atelectasis seen. Mild cardiomegaly. Hepatobiliary: No focal liver abnormality is seen. No gallstones, gallbladder wall thickening, or biliary dilatation. Pancreas: Unremarkable. No pancreatic ductal dilatation or surrounding inflammatory changes. Spleen: Normal in size without focal abnormality. Adrenals/Urinary Tract: Bilateral adrenals and left kidney have a normal appearance. There is pelvicaliectasis seen in the right kidney. Stomach/Bowel: Stomach is within normal limits. Appendix is not delineated. No evidence of bowel wall thickening, distention, or inflammatory changes. There is an NG tube in the body of the stomach. The transverse colon is  located inferior to the stomach and the stomach may be amenable for G-tube insertion. Vascular/Lymphatic: There has been interval aortobifemoral bypass graft placement. There is large fluid, most likely hematoma seen at the right retroperitoneal region extending from the right mid pelvis up to the inferior aspect of the liver measuring approximately 11 x 11.2 x 15.4 cm likely arising from the leak at the distal vascular anastomotic site. Reproductive: Status post hysterectomy. Stable appearance of the adnexal tissue. Other: Right retroperitoneal hematoma. There is large decubitus ulcer seen at and inferior to the coccyx. Musculoskeletal: No acute or significant osseous findings. IMPRESSION: 1. There is an NG tube with its distal end in the body of the stomach. The transverse colon is located inferior to the stomach. The stomach may be amenable for a G-tube insertion in the IR. 2. There has been interval aortobifemoral bypass graft placement. There is a large hematoma, appears subacute at the right retroperitoneal region likely arising from the distal anastomotic site measuring approximately 11 x 11.2 x 15.4 cm. 3. Large decubitus ulcer at and inferior to the tip of the coccyx. The decubitus ulcer is new since the previous study. These results will be called to the ordering clinician or representative by the Radiologist Assistant, and communication documented in the PACS or [employer]. Electronically Signed   By: Frazier Richards M.D.   On: 04/19/2022 16:33    Anti-infectives (From admission, onward)    Start     Dose/Rate Route Frequency Ordered Stop   04/18/22 1115  cephALEXin (KEFLEX) capsule 500 mg        500 mg Per Tube Every 12 hours 04/18/22 1023     04/16/22 2000  piperacillin-tazobactam (ZOSYN) IVPB 3.375 g  Status:  Discontinued  3.375 g 12.5 mL/hr over 240 Minutes Intravenous Every 8 hours 04/16/22 1901 04/18/22 1023   03/23/22 0830  piperacillin-tazobactam (ZOSYN) IVPB 3.375 g         3.375 g 12.5 mL/hr over 240 Minutes Intravenous Every 8 hours 03/23/22 0736 03/28/22 0121   03/21/22 1945  ceFAZolin (ANCEF) IVPB 2g/100 mL premix        2 g 200 mL/hr over 30 Minutes Intravenous Every 8 hours 03/21/22 1853 03/22/22 1144   03/21/22 0539  ceFAZolin (ANCEF) IVPB 2g/100 mL premix        2 g 200 mL/hr over 30 Minutes Intravenous 30 min pre-op 03/21/22 0539 03/21/22 0803        Assessment/Plan Dysphagia - This is a 66yo female with prolonged hospitalization for critical limb ischemia requiring emergency aortobifemoral bypass 03/21/22. She is on a dysphagia diet and nocturnal tube feedings via Cortrak. Unable to get enough nutrition by mouth, therefore seems reasonable to proceed with PEG placement for continued tube feedings at SNF. Tentatively plan for PEG with possible laparoscopic assistance in the OR tomorrow. NPO and hold tube feedings after MN.  ID - keflex 6/5>> VTE - sqh, asa '81mg'$  FEN - D1 diet, nocturnal TF Foley - wick  I reviewed hospitalist and vascular surgery notes, last 24 h vitals and pain scores, last 48 h intake and output, last 24 h labs and trends, and last 24 h imaging results.   Wellington Hampshire, [facility] 04/21/2022, 10:53 AM Please see Amion for pager number during day hours 7:00am-4:30pm

## 2022-04-21 NOTE — Progress Notes (Signed)
Mobility Specialist: Progress Note   04/21/22 1435  Mobility  Activity Transferred from chair to bed  Level of Assistance +2 (takes two people)  Civil engineer, contracting / Overhead Lift  Activity Response Tolerated well  $Mobility charge 1 Mobility   Pt assisted back to bed per request using [employer]. C/o discomfort during transfer. Pt has call bell at her side and family present in the room.   [facility] Aristeo Hankerson Mobility Specialist Mobility Specialist [address]: [phone]

## 2022-04-22 ENCOUNTER — Encounter (HOSPITAL_COMMUNITY)
Admission: RE | Disposition: A | Discharge status: Other Acute Inpatient Hospital | Payer: Self-pay | Source: Home / Self Care | Attending: Vascular Surgery

## 2022-04-22 DIAGNOSIS — E43 Unspecified severe protein-calorie malnutrition: Secondary | ICD-10-CM | POA: Diagnosis not present

## 2022-04-22 DIAGNOSIS — L89150 Pressure ulcer of sacral region, unstageable: Secondary | ICD-10-CM | POA: Diagnosis not present

## 2022-04-22 DIAGNOSIS — I739 Peripheral vascular disease, unspecified: Secondary | ICD-10-CM | POA: Diagnosis not present

## 2022-04-22 DIAGNOSIS — I5021 Acute systolic (congestive) heart failure: Secondary | ICD-10-CM | POA: Diagnosis not present

## 2022-04-22 LAB — GLUCOSE, CAPILLARY
Glucose-Capillary: 129 mg/dL — ABNORMAL HIGH (ref 70–99)
Glucose-Capillary: 147 mg/dL — ABNORMAL HIGH (ref 70–99)
Glucose-Capillary: 127 mg/dL — ABNORMAL HIGH (ref 70–99)
Glucose-Capillary: 152 mg/dL — ABNORMAL HIGH (ref 70–99)

## 2022-04-22 LAB — PROCALCITONIN: Procalcitonin: 0.21 ng/mL

## 2022-04-22 LAB — APTT: aPTT: 24 seconds (ref 24–36)

## 2022-04-22 SURGERY — INSERTION, PEG TUBE
Anesthesia: General

## 2022-04-22 NOTE — Progress Notes (Addendum)
SLP Cancellation Note  Patient Details Name: TYWANDA RICE MRN: [id number] DOB: 19-Feb-1956   Cancelled treatment:       Reason Eval/Treat Not Completed: Medical issues which prohibited therapy (Pt NPO for PEG placement. SLP will follow up for swallowing on a subsequent date unless contacted sooner by staff.)  Tobie Poet I. Hardin Negus, [place], [place] Office number [phone] Pager [place] 04/22/2022, 9:37 AM

## 2022-04-22 NOTE — Progress Notes (Signed)
PROGRESS NOTE    Gabrielle [name]  GYJ:[id number] DOB: [birth year] DOA: 03/21/2022 PCP: Dorna Mai, MD   Brief Narrative: Gabrielle [name] is a 66 y.o. female with PMH significant for DM2, HTN, HLD, PAD rectal cancer. 5/8, patient was admitted under vascular surgery on for critical limb ischemia and underwent emergency aortobifemoral bypass Postoperatively she was monitored in ICU. 5/10 - 5/20, intubated and mechanically ventilated. Course was complicated by shock, stress-induced cardiomyopathy, persistent leukocytosis, paralytic ileus requiring TPN. Ileus subsequently resolved and started on oral diet. 6/2, transferred out off ICU. [facility] service consulted for continued medical management.   Assessment and Plan:  Postoperative cardiogenic shock Acute systolic heart failure Patient required ICU admission and initiation of vasopressin and norepinephrine for management of shock. Cardiology consulted. Serial Transthoracic Echocardiogram significant for EF of 55-60% > 20-25% > 55-60%. Patient diuresed with improvement of clinical status. Shock resolved and vasopressors weaned off. Resolved.  Retroperitoneal hematoma Possible contributed to above problem. Large hematoma measuring 11  x 11.2 x 15.4 cm noted on CT abdomen/pelvis on 6/6. Discussion with vascular surgery with recommendations for antibiotic coverage -Zosyn IV with plan for long term suppressive therapy until hematoma resolves  Abdominal incision wound Evaluated and managed by vascular surgery.  Acute respiratory failure with hypoxia Patient required ICU admission and intubation for respiratory distress. Patient successfully extubated. Weaned to room air. Resolved.  Critical limb ischemia PAD Hyperlipidemia Patient initially admitted for aortobifemoral bypass by vascular surgery. Currently stable. -Continue aspirin 81 mg daily and Crestor 40 mg daily  Complicated UTI Associated fevers. Urine culture (6/3) significant for  E. Coli bacteruria. Patient empirically treated with Zosyn which has transitioned to Keflex. Blood cultures (6/2) with no growth. -Continue 7 day course of antibiotics -Continue Zosyn IV for coverage of above  Acute metabolic encephalopathy In setting of ICU admission/shock/UTI. Patient mental status appears to be improved.  Diabetes mellitus, type 2 Uncontrolled with hyperglycemia. Patient is on Januvia, metformin and Wilder Glade as an outpatient. Januvia and [place] held on admission. Started on insulin this admission. Issues with control secondary to tube feeds. Tradjenta started while inpatient. Blood sugar uncontrolled. Discontinued metformin and Tradjenta while inpatient. -Continue Levemir 25 units Novolog 3 units TID with meals and SSI q4 hours -Continue Novolog 4 units q4 hours overnight while on tube feeds  Acute diarrhea In setting of tube feedings. Started on imodium.  Severe malnutrition Dysphagia Dietitian consulted. Patient with a Cortrak NG tube on tube feeds. Received TPN while in the ICU, which is now discontinued. Now with plans for PEG tube placement which have been deferred until after the weekend. Malnutrition complicated by dysphagia; speech therapy is on board. -Dietitian recommendations (6/4): Change to nocturnal tube feeding via Cortrak:  Vital 1.5 at 70 ml/hr x 14 hours (980 ml total) Pro-Source TF 90 mL BID Provides 1630 kcals (85% of estimated needs), 110 g of protein (100% of minimum estimated needs), 749 mL of free water. Free water of 100 mL q 6 hours with TF provides 1148 mL of free water. Continue Juven BID per tube, each packet provides 80 calories, 8 grams of carbohydrate, 2.5  grams of protein (collagen), 7 grams of L-arginine and 7 grams of L-glutamine; supplement contains CaHMB, Vitamins C, E, B12 and Zinc to promote wound healing. Continue Magic cup TID with meals, each supplement provides 290 kcal and 9 grams of protein. Continue Ensure Plus High Protein  po TID, each supplement provides 350 kcal and 20 grams of protein. Feeding assistance at  meal times and with supplements. -PEG tube per general surgery -SLP recommendations (6/9): Diet recommendations: Dysphagia 1 (puree);Thin liquid Liquids provided via: Straw Medication Administration: Whole meds with puree Supervision: Staff to assist with self feeding;Full supervision/cueing for compensatory strategies Compensations: Minimize environmental distractions;Slow rate;Small sips/bites Postural Changes and/or Swallow Maneuvers: Seated upright 90 degrees  Sacral ulcer Unstageable. Not present on admission. Wound care consulted. Receiving hydrotherapy.    DVT prophylaxis: Heparin subq Code Status:   Code Status: Full Code Family Communication: None at bedside Disposition Plan: Discharge to SNF pending PEG tube placement, specialist recommendations and bed availability   Consultants:  Cardiology PCCM Vascular surgery  Procedures:  Transthoracic Echocardiogram (5/9) Intubation (5/10 >> 5/20) Limited Transthoracic Echocardiogram (5/10) Cortrak placement (5/17) Limited Transthoracic Echocardiogram (5/21)  Antimicrobials: Zosyn Cephalexin   Subjective: Patient reports wanting to get out of bed. No other concerns.  Objective: BP 115/73 (BP Location: Left Wrist)   Pulse 96   Temp 97.7 F (36.5 C) (Oral)   Resp 20   Ht '4\' 9"'$  (1.448 m)   Wt 62.2 kg   SpO2 97%   BMI 29.67 kg/m   Examination:  General exam: Appears calm and comfortable Respiratory system: Clear to auscultation. Respiratory effort normal. Cardiovascular system: S1 & S2 heard, RRR. Gastrointestinal system: Abdomen is nondistended, soft and nontender. Normal bowel sounds heard. Abdominal wound with packing in place. Central nervous system: Alert and oriented to person and place. Musculoskeletal: No calf tenderness Skin: No cyanosis. No rashes     Data Reviewed: I have personally reviewed following labs  and imaging studies  CBC Lab Results  Component Value Date   WBC 16.3 (H) 04/21/2022   RBC 3.10 (L) 04/21/2022   HGB 8.7 (L) 04/21/2022   HCT 27.7 (L) 04/21/2022   MCV 89.4 04/21/2022   MCH 28.1 04/21/2022   PLT 271 04/21/2022   MCHC 31.4 04/21/2022   RDW 15.0 04/21/2022   LYMPHSABS 3.9 04/20/2022   MONOABS 0.7 04/20/2022   EOSABS 0.1 04/20/2022   BASOSABS 0.0 27/78/2423     Last metabolic panel Lab Results  Component Value Date   NA 133 (L) 04/21/2022   K 4.1 04/21/2022   CL 105 04/21/2022   CO2 24 04/21/2022   BUN 23 04/21/2022   CREATININE 0.66 04/21/2022   GLUCOSE 318 (H) 04/21/2022   GFRNONAA >60 04/21/2022   GFRAA 78 10/23/2020   CALCIUM 8.4 (L) 04/21/2022   PHOS 3.0 04/08/2022   PROT 6.6 04/21/2022   ALBUMIN 1.9 (L) 04/21/2022   LABGLOB 3.7 03/22/2021   AGRATIO 1.2 03/22/2021   BILITOT 0.3 04/21/2022   ALKPHOS 73 04/21/2022   AST 58 (H) 04/21/2022   ALT 98 (H) 04/21/2022   ANIONGAP 4 (L) 04/21/2022    GFR: Estimated Creatinine Clearance: 53.1 mL/min (by C-G formula based on SCr of 0.66 mg/dL).  Recent Results (from the past 240 hour(s))  Culture, blood (Routine X 2) w Reflex to ID Panel     Status: None   Collection Time: 04/15/22  7:33 PM   Specimen: BLOOD  Result Value Ref Range Status   Specimen Description BLOOD BLOOD LEFT ARM  Final   Special Requests   Final    BOTTLES DRAWN AEROBIC AND ANAEROBIC Blood Culture adequate volume   Culture   Final    NO GROWTH 5 DAYS Performed at [facility], [address] [place], [place] [address]    Report Status 04/20/2022 FINAL  Final  Culture, blood (Routine X 2)  w Reflex to ID Panel     Status: None   Collection Time: 04/15/22  7:36 PM   Specimen: BLOOD  Result Value Ref Range Status   Specimen Description BLOOD BLOOD LEFT FOREARM  Final   Special Requests   Final    BOTTLES DRAWN AEROBIC ONLY Blood Culture adequate volume   Culture   Final    NO GROWTH 5 DAYS Performed at [facility], [address] [place], [place] [address]    Report Status 04/20/2022 FINAL  Final  Urine Culture     Status: Abnormal   Collection Time: 04/16/22  7:54 AM   Specimen: Urine, Catheterized  Result Value Ref Range Status   Specimen Description URINE, CATHETERIZED  Final   Special Requests   Final    NONE Performed at [facility], [place] [address], [place], [place] [address]    Culture >=100,000 COLONIES/mL ESCHERICHIA COLI (A)  Final   Report Status 04/18/2022 FINAL  Final   Organism ID, Bacteria ESCHERICHIA COLI (A)  Final      Susceptibility   Escherichia coli - MIC*    AMPICILLIN >=32 RESISTANT Resistant     CEFAZOLIN 8 SENSITIVE Sensitive     CEFEPIME <=0.12 SENSITIVE Sensitive     CEFTRIAXONE <=0.25 SENSITIVE Sensitive     CIPROFLOXACIN >=4 RESISTANT Resistant     GENTAMICIN <=1 SENSITIVE Sensitive     IMIPENEM <=0.25 SENSITIVE Sensitive     NITROFURANTOIN <=16 SENSITIVE Sensitive     TRIMETH/SULFA <=20 SENSITIVE Sensitive     AMPICILLIN/SULBACTAM >=32 RESISTANT Resistant     PIP/TAZO <=4 SENSITIVE Sensitive     * >=100,000 COLONIES/mL ESCHERICHIA COLI      Radiology Studies: No results found.    LOS: 32 days    Cordelia Poche, MD [facility] 04/22/2022, 2:10 PM   If 7PM-7AM, please contact night-coverage [email or web address]

## 2022-04-22 NOTE — Progress Notes (Signed)
Notified by attending provider on 04/20/22 that general surgery will place G tube.   Will cancel IR G tube placement order.  Please call IR for questions and concerns.    Armando Gang Alantra Popoca PA-C 04/22/2022 3:47 PM

## 2022-04-22 NOTE — Progress Notes (Signed)
Cortrak feedings have been stopped.    Lupita Dawn, RN

## 2022-04-22 NOTE — Progress Notes (Signed)
Speech Language Pathology Treatment: Cognitive-Linquistic  Patient Details Name: Gabrielle [name] MRN: [id number] DOB: 02-03-1956 Today's Date: 04/22/2022 Time: 0109-3235 SLP Time Calculation (min) (ACUTE ONLY): 11 min  Assessment / Plan / Recommendation Clinical Impression  Pt was seen for treatment with her husband present. Pt was alert and cooperative during the session, but she had received fentanyl immediately prior to the session and the session was ultimately terminated prematurely due to pt's difficulty with attending and processing. Pt's husband stated that her cognitive presentation was notably different since medication administration and described her as "doped up". Pt followed simple 1-step commands with 80% accuracy. Her response to questions was inconsistent and processing speed was reduced with over 30 seconds to process; repetition was then needed for a response. Pt exhibited difficulty with focused and sustained attention throughout the session despite cues. SLP will continue to follow pt.     HPI HPI: Patient is a 66 y/o female who presents on 5/8 for open aortobifemoral bypass and Bilateral femoral endarterectomy complicated by MI, heart failure and shock, intubated 5/10-5/20. PMH includes rectal cancer, DM2, HTN.      SLP Plan  Continue with current plan of care      Recommendations for follow up therapy are one component of a multi-disciplinary discharge planning process, led by the attending physician.  Recommendations may be updated based on patient status, additional functional criteria and insurance authorization.    Recommendations  Diet recommendations: Dysphagia 1 (puree);Thin liquid Liquids provided via: Straw Medication Administration: Whole meds with puree Supervision: Staff to assist with self feeding;Full supervision/cueing for compensatory strategies Compensations: Minimize environmental distractions;Slow rate;Small sips/bites Postural Changes and/or  Swallow Maneuvers: Seated upright 90 degrees                Oral Care Recommendations: Oral care BID Follow Up Recommendations: Acute inpatient rehab (3hours/day) Assistance recommended at discharge: Frequent or constant Supervision/Assistance SLP Visit Diagnosis: Dysphagia, oropharyngeal phase (R13.12) Plan: Continue with current plan of care         Estanislado Surgeon I. Hardin Negus, [place], [place] Office number [phone] Pager [place]  04/22/2022, 10:46 AM

## 2022-04-22 NOTE — Progress Notes (Signed)
Physical Therapy Wound Treatment Patient Details  Name: Gabrielle [name] MRN: [id number] Date of Birth: June 22, 1956  Today's Date: 04/22/2022 Time: 1100-1148 Time Calculation (min): 48 min  Subjective  Subjective Assessment Subjective: Pt reports "just do what you have to". Patient and Family Stated Goals: to not hurt Date of Onset:  (unknown) Prior Treatments: dressing changes  Pain Score:  Pre-medicated and again medicated during session, but poor pain tolerance  Wound Assessment  Pressure Injury 03/30/22 Sacrum Posterior;Right;Left Unstageable - Full thickness tissue loss in which the base of the injury is covered by slough (yellow, tan, gray, green or brown) and/or eschar (tan, brown or black) in the wound bed. (Active)  Wound Image  04/18/22 1226  Dressing Type Foam - Lift dressing to assess site every shift;Barrier Film (skin prep);Gauze (Comment);Other (Comment) 04/22/22 1516  Dressing Clean, Dry, Intact 04/22/22 1516  Dressing Change Frequency Daily 04/22/22 1516  State of Healing Eschar 04/22/22 1516  Site / Wound Assessment Brown;Yellow;Pink;Pale;Black 04/22/22 1516  % Wound base Red or Granulating 65% 04/22/22 1516  % Wound base Yellow/Fibrinous Exudate 30% 04/22/22 1516  % Wound base Black/Eschar 5% 04/22/22 1516  % Wound base Other/Granulation Tissue (Comment) 0% 04/22/22 1516  Peri-wound Assessment Erythema (non-blanchable);Pink;Purple 04/22/22 1516  Wound Length (cm) 10 cm 04/18/22 1226  Wound Width (cm) 11.5 cm 04/18/22 1226  Wound Depth (cm) 1 cm 04/18/22 1226  Wound Surface Area (cm^2) 115 cm^2 04/18/22 1226  Wound Volume (cm^3) 115 cm^3 04/18/22 1226  Tunneling (cm) 0 04/18/22 1226  Undermining (cm) 0 04/18/22 1226  Margins Unattached edges (unapproximated) 04/22/22 1516  Drainage Amount Moderate 04/22/22 1516  Drainage Description Serosanguineous 04/22/22 1516  Treatment Debridement (Selective);Irrigation;Packing (Dry gauze);Other (Comment) 04/22/22 1516    Hydrotherapy Pulsed lavage therapy - wound location: sacral Pulsed Lavage with Suction (psi): 12 psi Pulsed Lavage with Suction - Normal Saline Used: 1000 mL Pulsed Lavage Tip: Tip with splash shield Selective Debridement Selective Debridement - Location: sacral Selective Debridement - Tools Used: Forceps, Scalpel Selective Debridement - Tissue Removed: Black and yellow necrotic tissue with necrotic adipose tissue    Wound Assessment and Plan  Wound Therapy - Assess/Plan/Recommendations Wound Therapy - Clinical Statement: Pt pre-medicated, but with poor pain tolerance, asking for more pain meds at start of session which the RN did provide. Pt's wound bed is demonstrating improvement with reduction in the percentage of necrotic tissue with increased red granulation tissue today. There was much less purulent drainage in the deeper pocket today. Pt would continue to benefit from hydrotherapy to further remove necrotic tissue and improve wound healing. Wound Therapy - Functional Problem List: functional debility after prolonged time in bed. Factors Delaying/Impairing Wound Healing: Immobility, Multiple medical problems, Vascular compromise, Diabetes Mellitus Hydrotherapy Plan: Dressing change, Debridement, Pulsatile lavage with suction, Patient/family education Wound Therapy - Frequency: 3X / week Wound Therapy - Current Recommendations: PT Wound Therapy - Follow Up Recommendations: dressing changes by RN, dressing changes by family/patient  Wound Therapy Goals- Improve the function of patient's integumentary system by progressing the wound(s) through the phases of wound healing (inflammation - proliferation - remodeling) by: Wound Therapy Goals - Improve the function of patient's integumentary system by progressing the wound(s) through the phases of wound healing by: Decrease Necrotic Tissue to: 10 percent Decrease Necrotic Tissue - Progress: Progressing toward goal Increase Granulation  Tissue to: 90% Increase Granulation Tissue - Progress: Progressing toward goal Improve Drainage Characteristics: Min, Serous Improve Drainage Characteristics - Progress: Progressing toward goal Goals/treatment plan/discharge plan were  made with and agreed upon by patient/family: Yes Time For Goal Achievement: 7 days Wound Therapy - Potential for Goals: Good  Goals will be updated until maximal potential achieved or discharge criteria met.  Discharge criteria: when goals achieved, discharge from hospital, MD decision/surgical intervention, no progress towards goals, refusal/missing three consecutive treatments without notification or medical reason.  GP     Charges PT Wound Care Charges $Wound Debridement up to 20 cm: < or equal to 20 cm $ Wound Debridement each add'l 20 sqcm: 2 $PT Hydrotherapy Visit: 1 Visit      Moishe Spice, PT, DPT Acute Rehabilitation Services  Office: [phone]   Orvan Falconer 04/22/2022, 3:21 PM

## 2022-04-22 NOTE — Progress Notes (Addendum)
Progress Note    04/22/2022 8:47 AM 32 Days Post-Op  Subjective:  alert and communicating well this morning   Vitals:   04/22/22 0807 04/22/22 0834  BP: 127/64   Pulse:    Resp: (!) 22   Temp: 98.5 F (36.9 C)   SpO2:  96%   Physical Exam: Lungs:  non labored Incisions:  groin incisions healed; distal abd incision with superficial dehiscence, no purulence  Extremities:  feet warm Neurologic: alert and oriented  CBC    Component Value Date/Time   WBC 16.3 (H) 04/21/2022 0506   RBC 3.10 (L) 04/21/2022 0506   HGB 8.7 (L) 04/21/2022 0506   HGB 14.5 03/22/2021 0916   HCT 27.7 (L) 04/21/2022 0506   HCT 43.9 03/22/2021 0916   PLT 271 04/21/2022 0506   PLT 261 03/22/2021 0916   MCV 89.4 04/21/2022 0506   MCV 90 03/22/2021 0916   MCH 28.1 04/21/2022 0506   MCHC 31.4 04/21/2022 0506   RDW 15.0 04/21/2022 0506   RDW 12.5 03/22/2021 0916   LYMPHSABS 3.9 04/20/2022 0600   MONOABS 0.7 04/20/2022 0600   EOSABS 0.1 04/20/2022 0600   BASOSABS 0.0 04/20/2022 0600    BMET    Component Value Date/Time   NA 133 (L) 04/21/2022 0506   NA 137 03/22/2021 0916   K 4.1 04/21/2022 0506   CL 105 04/21/2022 0506   CO2 24 04/21/2022 0506   GLUCOSE 318 (H) 04/21/2022 0506   BUN 23 04/21/2022 0506   BUN 11 03/22/2021 0916   CREATININE 0.66 04/21/2022 0506   CREATININE 0.74 11/11/2015 1921   CALCIUM 8.4 (L) 04/21/2022 0506   GFRNONAA >60 04/21/2022 0506   GFRNONAA >89 11/11/2015 1921   GFRAA 78 10/23/2020 1701   GFRAA >89 11/11/2015 1921    INR    Component Value Date/Time   INR 1.1 03/27/2022 0510     Intake/Output Summary (Last 24 hours) at 04/22/2022 0847 Last data filed at 04/22/2022 0037 Gross per 24 hour  Intake 2581.21 ml  Output 1420 ml  Net 1161.21 ml     Assessment/Plan:  66 y.o. female is s/p aortobifemoral bypass 32 Days Post-Op   BLE warm and well perfused on exam Abd incision wet to dry packing changed; no further purulence; wound seems superficial,  continue daily changes Hydrotherapy for sacral wound this morning Plans also noted for PEG tube placement with Dr. Bobbye Morton this afternoon   Dagoberto Ligas, PA-C [facility] [phone] 04/22/2022 8:47 AM  VASCULAR STAFF ADDENDUM: I have independently interviewed and examined the patient. I agree with the above.  I discussed Manie's overall clinical status with Dr. Jamas Lav this morning.  We both agreed to hold on PEG tube, especially with the new small wound appreciated at the inferior aspect of Abbygael's previous midline laparotomy incision.  On further exam this morning, the wound appears focal, no fascial defect appreciated.  Fibrinous debris was removed from the wound bed.  Being that Elaya has had a very difficult postoperative course, I would like to see the wound begin to heal prior to PEG placement.   While Shalini's continued leukocytosis can be explained from her sacral decubitus wound that is undergoing daily debridement, I am watching her overall clinical status closely to ensure she continues progressing appropriately.  I discussed Patches's previous CT with Dr. Bobbye Morton made pursue further imaging today to rule out other intra-abdominal processes.  Patient can eat, normal therapy schedule today.   Cassandria Santee, MD [facility] Phone Number: [phone] 04/22/2022 9:10 AM

## 2022-04-23 DIAGNOSIS — L89150 Pressure ulcer of sacral region, unstageable: Secondary | ICD-10-CM | POA: Diagnosis not present

## 2022-04-23 DIAGNOSIS — I739 Peripheral vascular disease, unspecified: Secondary | ICD-10-CM | POA: Diagnosis not present

## 2022-04-23 DIAGNOSIS — E43 Unspecified severe protein-calorie malnutrition: Secondary | ICD-10-CM | POA: Diagnosis not present

## 2022-04-23 DIAGNOSIS — I5021 Acute systolic (congestive) heart failure: Secondary | ICD-10-CM | POA: Diagnosis not present

## 2022-04-23 LAB — BASIC METABOLIC PANEL
Anion gap: 9 (ref 5–15)
BUN: 34 mg/dL — ABNORMAL HIGH (ref 8–23)
CO2: 25 mmol/L (ref 22–32)
Calcium: 8.6 mg/dL — ABNORMAL LOW (ref 8.9–10.3)
Chloride: 103 mmol/L (ref 98–111)
Creatinine, Ser: 0.56 mg/dL (ref 0.44–1.00)
GFR, Estimated: >60 mL/min (ref >60)
Glucose, Bld: 191 mg/dL — ABNORMAL HIGH (ref 70–99)
Potassium: 3.9 mmol/L (ref 3.5–5.1)
Sodium: 137 mmol/L (ref 135–145)

## 2022-04-23 LAB — CBC
HCT: 28.7 % — ABNORMAL LOW (ref 36.0–46.0)
Hemoglobin: 9 g/dL — ABNORMAL LOW (ref 12.0–15.0)
MCH: 28.2 pg (ref 26.0–34.0)
MCHC: 31.4 g/dL (ref 30.0–36.0)
MCV: 90 fL (ref 80.0–100.0)
Platelets: 300 10*3/uL (ref 150–400)
RBC: 3.19 MIL/uL — ABNORMAL LOW (ref 3.87–5.11)
RDW: 15.8 % — ABNORMAL HIGH (ref 11.5–15.5)
WBC: 11.9 10*3/uL — ABNORMAL HIGH (ref 4.0–10.5)
nRBC: 0 % (ref 0.0–0.2)

## 2022-04-23 LAB — GLUCOSE, CAPILLARY
Glucose-Capillary: 115 mg/dL — ABNORMAL HIGH (ref 70–99)
Glucose-Capillary: 128 mg/dL — ABNORMAL HIGH (ref 70–99)
Glucose-Capillary: 150 mg/dL — ABNORMAL HIGH (ref 70–99)
Glucose-Capillary: 195 mg/dL — ABNORMAL HIGH (ref 70–99)
Glucose-Capillary: 197 mg/dL — ABNORMAL HIGH (ref 70–99)
Glucose-Capillary: 210 mg/dL — ABNORMAL HIGH (ref 70–99)

## 2022-04-23 LAB — PROCALCITONIN: Procalcitonin: 0.19 ng/mL

## 2022-04-23 LAB — APTT: aPTT: 27 seconds (ref 24–36)

## 2022-04-23 NOTE — Progress Notes (Addendum)
[facility] [place]  Subjective  - does not interact much verbally   Objective 136/64 (!) 117 98.2 F (36.8 C) (Oral) 19 97%  Intake/Output Summary (Last 24 hours) at 04/23/2022 0920 Last data filed at 04/23/2022 0540 Gross per 24 hour  Intake 3335.83 ml  Output 1700 ml  Net 1635.83 ml    Incisions healing well, B LE warm and well perfused Lungs non labored breathing General no acute distress   Assessment/Planning: 66 y.o. female is s/p aortobifemoral bypass 33 Days Post-Op   Incisions healing well, cont wet to dry Feet warm and well perfused without ischemic skin changes Hydrotherapy for sacral wound this morning PEG tube canceled pending re schedule  Roxy Horseman 04/23/2022 9:20 AM --  Laboratory Lab Results: Recent Labs    04/21/22 0506 04/23/22 0500  WBC 16.3* 11.9*  HGB 8.7* 9.0*  HCT 27.7* 28.7*  PLT 271 300   BMET Recent Labs    04/21/22 0506 04/23/22 0500  NA 133* 137  K 4.1 3.9  CL 105 103  CO2 24 25  GLUCOSE 318* 191*  BUN 23 34*  CREATININE 0.66 0.56  CALCIUM 8.4* 8.6*    COAG Lab Results  Component Value Date   INR 1.1 03/27/2022   INR 1.2 03/26/2022   INR 1.1 03/25/2022   No results found for: "PTT"  I have independently interviewed and examined patient and agree with PA assessment and plan above.  Holding on PEG tube.  Leukocytosis improved from last check.  Daundre Biel C. Donzetta Matters, MD [facility] [place] Office: [phone] Pager: [phone]

## 2022-04-23 NOTE — Plan of Care (Signed)
  Problem: Clinical Measurements: Goal: Will remain free from infection Outcome: Progressing   Problem: Health Behavior/Discharge Planning: Goal: Ability to manage health-related needs will improve Outcome: Not Progressing   

## 2022-04-23 NOTE — Progress Notes (Signed)
PROGRESS NOTE    Gabrielle [name]  QMG:[id number] DOB: [birth year] DOA: 03/21/2022 PCP: Dorna Mai, MD   Brief Narrative: Gabrielle [name] is a 66 y.o. female with PMH significant for DM2, HTN, HLD, PAD rectal cancer. 5/8, patient was admitted under vascular surgery on for critical limb ischemia and underwent emergency aortobifemoral bypass Postoperatively she was monitored in ICU. 5/10 - 5/20, intubated and mechanically ventilated. Course was complicated by shock, stress-induced cardiomyopathy, persistent leukocytosis, paralytic ileus requiring TPN. Ileus subsequently resolved and started on oral diet. 6/2, transferred out off ICU. [facility] service consulted for continued medical management.   Assessment and Plan:  Postoperative cardiogenic shock Acute systolic heart failure Patient required ICU admission and initiation of vasopressin and norepinephrine for management of shock. Cardiology consulted. Serial Transthoracic Echocardiogram significant for EF of 55-60% > 20-25% > 55-60%. Patient diuresed with improvement of clinical status. Shock resolved and vasopressors weaned off. Resolved.  Retroperitoneal hematoma Possible contributed to above problem. Large hematoma measuring 11  x 11.2 x 15.4 cm noted on CT abdomen/pelvis on 6/6. Discussion with vascular surgery with recommendations for antibiotic coverage -Zosyn IV with plan for long term suppressive therapy until hematoma resolves  Abdominal incision wound Evaluated and managed by vascular surgery.  Acute respiratory failure with hypoxia Patient required ICU admission and intubation for respiratory distress. Patient successfully extubated. Weaned to room air. Resolved.  Critical limb ischemia PAD Hyperlipidemia Patient initially admitted for aortobifemoral bypass by vascular surgery. Currently stable. -Continue aspirin 81 mg daily and Crestor 40 mg daily  Complicated UTI Associated fevers. Urine culture (6/3) significant for  E. Coli bacteruria. Patient empirically treated with Zosyn which has transitioned to Keflex. Blood cultures (6/2) with no growth. -Continue 7 day course of antibiotics -Continue Zosyn IV for coverage of above  Acute metabolic encephalopathy In setting of ICU admission/shock/UTI. Patient mental status appears to be improved.  Diabetes mellitus, type 2 Uncontrolled with hyperglycemia. Patient is on Januvia, metformin and Wilder Glade as an outpatient. Januvia and [place] held on admission. Started on insulin this admission. Issues with control secondary to tube feeds. Tradjenta started while inpatient. Blood sugar uncontrolled. Discontinued metformin and Tradjenta while inpatient. -Continue Levemir 25 units Novolog 3 units TID with meals during the day and SSI q4 hours -Continue Novolog 4 units q4 hours overnight while on tube feeds  Acute diarrhea In setting of tube feedings. Started on imodium.  Severe malnutrition Dysphagia Dietitian consulted. Patient with a Cortrak NG tube on tube feeds. Received TPN while in the ICU, which is now discontinued. Now with plans for PEG tube placement which have been deferred until after the weekend. Malnutrition complicated by dysphagia; speech therapy is on board. Patient continues to have poor oral intake with poor motivation for improving intake. Have petitioned family to be more proactive in patient's nutrition intake. -Dietitian recommendations (6/4): Change to nocturnal tube feeding via Cortrak:  Vital 1.5 at 70 ml/hr x 14 hours (980 ml total) Pro-Source TF 90 mL BID Provides 1630 kcals (85% of estimated needs), 110 g of protein (100% of minimum estimated needs), 749 mL of free water. Free water of 100 mL q 6 hours with TF provides 1148 mL of free water. Continue Juven BID per tube, each packet provides 80 calories, 8 grams of carbohydrate, 2.5  grams of protein (collagen), 7 grams of L-arginine and 7 grams of L-glutamine; supplement contains CaHMB,  Vitamins C, E, B12 and Zinc to promote wound healing. Continue Magic cup TID with meals, each supplement provides  290 kcal and 9 grams of protein. Continue Ensure Plus High Protein po TID, each supplement provides 350 kcal and 20 grams of protein. Feeding assistance at meal times and with supplements. -PEG tube per general surgery -SLP recommendations (6/9): Diet recommendations: Dysphagia 1 (puree);Thin liquid Liquids provided via: Straw Medication Administration: Whole meds with puree Supervision: Staff to assist with self feeding;Full supervision/cueing for compensatory strategies Compensations: Minimize environmental distractions;Slow rate;Small sips/bites Postural Changes and/or Swallow Maneuvers: Seated upright 90 degrees  Sacral ulcer Unstageable. Not present on admission. Wound care consulted. Receiving hydrotherapy.    DVT prophylaxis: Heparin subq Code Status:   Code Status: Full Code Family Communication: Husband at bedside Disposition Plan: Discharge to SNF pending PEG tube placement, specialist recommendations and bed availability   Consultants:  Cardiology PCCM Vascular surgery  Procedures:  Transthoracic Echocardiogram (5/9) Intubation (5/10 >> 5/20) Limited Transthoracic Echocardiogram (5/10) Cortrak placement (5/17) Limited Transthoracic Echocardiogram (5/21)  Antimicrobials: Zosyn Cephalexin   Subjective: Patient continues to want to get out of bed. No issues overnight. Discussed encouragement of oral intake with husband. Husband states she is given too much medication that causes her to be sleepy and not be able to eat. Discussed with husband that we need to be more proactive about encouraging patient to eat, calling for meals, etc. We discussed my hope to prevent need for PEG placement if possible.  Objective: BP 136/64 (BP Location: Left Wrist)   Pulse (!) 117   Temp 98.2 F (36.8 C) (Oral)   Resp 19   Ht '4\' 9"'$  (1.448 m)   Wt 59.5 kg   SpO2 97%    BMI 28.37 kg/m   Examination:  General exam: Appears calm and comfortable Respiratory system: Clear to auscultation. Respiratory effort normal. Cardiovascular system: S1 & S2 heard, Slight tachycardia, normal rhythm. Gastrointestinal system: Abdomen is nondistended, soft and nontender. Normal bowel sounds heard. Central nervous system: Alert. No focal neurological deficits. Musculoskeletal: No edema. No calf tenderness Skin: No cyanosis. No rashes Psychiatry: Judgement and insight appear normal. Mood & affect appropriate.    Data Reviewed: I have personally reviewed following labs and imaging studies  CBC Lab Results  Component Value Date   WBC 11.9 (H) 04/23/2022   RBC 3.19 (L) 04/23/2022   HGB 9.0 (L) 04/23/2022   HCT 28.7 (L) 04/23/2022   MCV 90.0 04/23/2022   MCH 28.2 04/23/2022   PLT 300 04/23/2022   MCHC 31.4 04/23/2022   RDW 15.8 (H) 04/23/2022   LYMPHSABS 3.9 04/20/2022   MONOABS 0.7 04/20/2022   EOSABS 0.1 04/20/2022   BASOSABS 0.0 61/60/7371     Last metabolic panel Lab Results  Component Value Date   NA 137 04/23/2022   K 3.9 04/23/2022   CL 103 04/23/2022   CO2 25 04/23/2022   BUN 34 (H) 04/23/2022   CREATININE 0.56 04/23/2022   GLUCOSE 191 (H) 04/23/2022   GFRNONAA >60 04/23/2022   GFRAA 78 10/23/2020   CALCIUM 8.6 (L) 04/23/2022   PHOS 3.0 04/08/2022   PROT 6.6 04/21/2022   ALBUMIN 1.9 (L) 04/21/2022   LABGLOB 3.7 03/22/2021   AGRATIO 1.2 03/22/2021   BILITOT 0.3 04/21/2022   ALKPHOS 73 04/21/2022   AST 58 (H) 04/21/2022   ALT 98 (H) 04/21/2022   ANIONGAP 9 04/23/2022    GFR: Estimated Creatinine Clearance: 52 mL/min (by C-G formula based on SCr of 0.56 mg/dL).  Recent Results (from the past 240 hour(s))  Culture, blood (Routine X 2) w Reflex to ID Panel  Status: None   Collection Time: 04/15/22  7:33 PM   Specimen: BLOOD  Result Value Ref Range Status   Specimen Description BLOOD BLOOD LEFT ARM  Final   Special Requests    Final    BOTTLES DRAWN AEROBIC AND ANAEROBIC Blood Culture adequate volume   Culture   Final    NO GROWTH 5 DAYS Performed at [facility], [address], [place], [place] [address]    Report Status 04/20/2022 FINAL  Final  Culture, blood (Routine X 2) w Reflex to ID Panel     Status: None   Collection Time: 04/15/22  7:36 PM   Specimen: BLOOD  Result Value Ref Range Status   Specimen Description BLOOD BLOOD LEFT FOREARM  Final   Special Requests   Final    BOTTLES DRAWN AEROBIC ONLY Blood Culture adequate volume   Culture   Final    NO GROWTH 5 DAYS Performed at [facility], [place] [address], [place], [place] [address]    Report Status 04/20/2022 FINAL  Final  Urine Culture     Status: Abnormal   Collection Time: 04/16/22  7:54 AM   Specimen: Urine, Catheterized  Result Value Ref Range Status   Specimen Description URINE, CATHETERIZED  Final   Special Requests   Final    NONE Performed at [facility], [place] [address], [place],  [address]    Culture >=100,000 COLONIES/mL ESCHERICHIA COLI (A)  Final   Report Status 04/18/2022 FINAL  Final   Organism ID, Bacteria ESCHERICHIA COLI (A)  Final      Susceptibility   Escherichia coli - MIC*    AMPICILLIN >=32 RESISTANT Resistant     CEFAZOLIN 8 SENSITIVE Sensitive     CEFEPIME <=0.12 SENSITIVE Sensitive     CEFTRIAXONE <=0.25 SENSITIVE Sensitive     CIPROFLOXACIN >=4 RESISTANT Resistant     GENTAMICIN <=1 SENSITIVE Sensitive     IMIPENEM <=0.25 SENSITIVE Sensitive     NITROFURANTOIN <=16 SENSITIVE Sensitive     TRIMETH/SULFA <=20 SENSITIVE Sensitive     AMPICILLIN/SULBACTAM >=32 RESISTANT Resistant     PIP/TAZO <=4 SENSITIVE Sensitive     * >=100,000 COLONIES/mL ESCHERICHIA COLI      Radiology Studies: No results found.    LOS: 33 days    Cordelia Poche, MD [facility] 04/23/2022, 10:26 AM   If 7PM-7AM, please contact night-coverage [email or web address]

## 2022-04-24 DIAGNOSIS — I739 Peripheral vascular disease, unspecified: Secondary | ICD-10-CM | POA: Diagnosis not present

## 2022-04-24 DIAGNOSIS — L89150 Pressure ulcer of sacral region, unstageable: Secondary | ICD-10-CM | POA: Diagnosis not present

## 2022-04-24 DIAGNOSIS — I5021 Acute systolic (congestive) heart failure: Secondary | ICD-10-CM | POA: Diagnosis not present

## 2022-04-24 DIAGNOSIS — E43 Unspecified severe protein-calorie malnutrition: Secondary | ICD-10-CM | POA: Diagnosis not present

## 2022-04-24 LAB — APTT: aPTT: 23 seconds — ABNORMAL LOW (ref 24–36)

## 2022-04-24 LAB — GLUCOSE, CAPILLARY
Glucose-Capillary: 140 mg/dL — ABNORMAL HIGH (ref 70–99)
Glucose-Capillary: 146 mg/dL — ABNORMAL HIGH (ref 70–99)
Glucose-Capillary: 149 mg/dL — ABNORMAL HIGH (ref 70–99)
Glucose-Capillary: 160 mg/dL — ABNORMAL HIGH (ref 70–99)
Glucose-Capillary: 193 mg/dL — ABNORMAL HIGH (ref 70–99)
Glucose-Capillary: 207 mg/dL — ABNORMAL HIGH (ref 70–99)
Glucose-Capillary: 228 mg/dL — ABNORMAL HIGH (ref 70–99)

## 2022-04-24 MED ORDER — INSULIN ASPART 100 UNIT/ML IJ SOLN
4.0000 [IU] | Freq: Three times a day (TID) | INTRAMUSCULAR | Status: DC
  Administered 2022-04-30: 4 [IU] via SUBCUTANEOUS

## 2022-04-24 MED ORDER — INSULIN ASPART 100 UNIT/ML IJ SOLN
4.0000 [IU] | INTRAMUSCULAR | Status: DC
  Administered 2022-04-24 – 2022-05-01 (×20): 4 [IU] via SUBCUTANEOUS

## 2022-04-24 NOTE — Progress Notes (Signed)
Mobility Specialist Progress Note    04/24/22 1242  Mobility  Activity Transferred from bed to chair  Level of Assistance +2 (takes two people)  Assistive Device MaxiMove  Activity Response Tolerated fair  $Mobility charge 1 Mobility   Left with chair alarm on and husband present.   Hildred Alamin Mobility Specialist

## 2022-04-24 NOTE — Plan of Care (Signed)
  Problem: Education: Goal: Knowledge of General Education information will improve Description: Including pain rating scale, medication(s)/side effects and non-pharmacologic comfort measures Outcome: Progressing   Problem: Health Behavior/Discharge Planning: Goal: Ability to manage health-related needs will improve Outcome: Progressing   Problem: Clinical Measurements: Goal: Ability to maintain clinical measurements within normal limits will improve Outcome: Progressing Goal: Will remain free from infection Outcome: Progressing Goal: Diagnostic test results will improve Outcome: Progressing Goal: Respiratory complications will improve Outcome: Progressing Goal: Cardiovascular complication will be avoided Outcome: Progressing   Problem: Activity: Goal: Risk for activity intolerance will decrease Outcome: Progressing   Problem: Nutrition: Goal: Adequate nutrition will be maintained Outcome: Progressing   Problem: Coping: Goal: Level of anxiety will decrease Outcome: Progressing   Problem: Elimination: Goal: Will not experience complications related to bowel motility Outcome: Progressing   Problem: Pain Managment: Goal: General experience of comfort will improve Outcome: Progressing   Problem: Safety: Goal: Ability to remain free from injury will improve Outcome: Progressing   Problem: Skin Integrity: Goal: Risk for impaired skin integrity will decrease Outcome: Progressing   Problem: Education: Goal: Ability to describe self-care measures that may prevent or decrease complications (Diabetes Survival Skills Education) will improve Outcome: Progressing Goal: Individualized Educational Video(s) Outcome: Progressing   Problem: Coping: Goal: Ability to adjust to condition or change in health will improve Outcome: Progressing   Problem: Fluid Volume: Goal: Ability to maintain a balanced intake and output will improve Outcome: Progressing   Problem: Health  Behavior/Discharge Planning: Goal: Ability to identify and utilize available resources and services will improve Outcome: Progressing Goal: Ability to manage health-related needs will improve Outcome: Progressing   Problem: Metabolic: Goal: Ability to maintain appropriate glucose levels will improve Outcome: Progressing   Problem: Nutritional: Goal: Maintenance of adequate nutrition will improve Outcome: Progressing Goal: Progress toward achieving an optimal weight will improve Outcome: Progressing   Problem: Skin Integrity: Goal: Risk for impaired skin integrity will decrease Outcome: Progressing   Problem: Tissue Perfusion: Goal: Adequacy of tissue perfusion will improve Outcome: Progressing

## 2022-04-24 NOTE — Plan of Care (Signed)
  Problem: Clinical Measurements: Goal: Will remain free from infection Outcome: Progressing Goal: Respiratory complications will improve Outcome: Progressing Goal: Cardiovascular complication will be avoided Outcome: Progressing   Problem: Health Behavior/Discharge Planning: Goal: Ability to manage health-related needs will improve Outcome: Not Progressing   

## 2022-04-24 NOTE — Plan of Care (Signed)
  Problem: Clinical Measurements: Goal: Respiratory complications will improve Outcome: Progressing Goal: Cardiovascular complication will be avoided Outcome: Progressing   

## 2022-04-24 NOTE — Progress Notes (Addendum)
[facility]  Subjective  - more alert and interavtive  Objective 137/72 (!) 113 98.7 F (37.1 C) (Oral) 19 96%  Intake/Output Summary (Last 24 hours) at 04/24/2022 0913 Last data filed at 04/24/2022 0768 Gross per 24 hour  Intake 1820.63 ml  Output 1050 ml  Net 770.63 ml    Incisions healing well, B LE warm and well perfused Lungs non labored breathing General no acute distress Abdomin dressing changed distal abd incision with superficial dehiscence, no purulence  Heart Tachy  Assessment/Planning: POD #34 66 y.o. female is s/p aortobifemoral bypass  Incisions healing well, cont wet to dry Feet warm and well perfused without ischemic skin changes Hydrotherapy for sacral wound this morning  Roxy Horseman 04/24/2022 9:13 AM --  Laboratory Lab Results: Recent Labs    04/23/22 0500  WBC 11.9*  HGB 9.0*  HCT 28.7*  PLT 300   BMET Recent Labs    04/23/22 0500  NA 137  K 3.9  CL 103  CO2 25  GLUCOSE 191*  BUN 34*  CREATININE 0.56  CALCIUM 8.6*    COAG Lab Results  Component Value Date   INR 1.1 03/27/2022   INR 1.2 03/26/2022   INR 1.1 03/25/2022   No results found for: "PTT"  I have independently interviewed and examined patient and agree with PA assessment and plan above.   Levaughn Puccinelli C. Donzetta Matters, MD [facility] [place] Office: [phone] Pager: [phone]

## 2022-04-24 NOTE — Progress Notes (Signed)
Mobility Specialist Progress Note    04/24/22 1423  Mobility  Activity Transferred from chair to bed  Level of Assistance +2 (takes two people)  Assistive Device Stedy  Activity Response Tolerated fair  $Mobility charge 1 Mobility   Left with call bell in reach.   Hildred Alamin Mobility Specialist

## 2022-04-24 NOTE — Progress Notes (Signed)
PROGRESS NOTE    Gabrielle [name]  ZMO:[id number] DOB: [birth year] DOA: 03/21/2022 PCP: Dorna Mai, MD   Brief Narrative: Gabrielle [name] is a 66 y.o. female with PMH significant for DM2, HTN, HLD, PAD rectal cancer. 5/8, patient was admitted under vascular surgery on for critical limb ischemia and underwent emergency aortobifemoral bypass Postoperatively she was monitored in ICU. 5/10 - 5/20, intubated and mechanically ventilated. Course was complicated by shock, stress-induced cardiomyopathy, persistent leukocytosis, paralytic ileus requiring TPN. Ileus subsequently resolved and started on oral diet. 6/2, transferred out off ICU. [facility] service consulted for continued medical management.   Assessment and Plan:  Postoperative cardiogenic shock Acute systolic heart failure Patient required ICU admission and initiation of vasopressin and norepinephrine for management of shock. Cardiology consulted. Serial Transthoracic Echocardiogram significant for EF of 55-60% > 20-25% > 55-60%. Patient diuresed with improvement of clinical status. Shock resolved and vasopressors weaned off. Resolved.  Retroperitoneal hematoma Possible contributed to above problem. Large hematoma measuring 11  x 11.2 x 15.4 cm noted on CT abdomen/pelvis on 6/6. Discussion with vascular surgery with recommendations for antibiotic coverage -Zosyn IV with plan for long term suppressive therapy until hematoma resolves  Abdominal incision wound Evaluated and managed by vascular surgery.  Acute respiratory failure with hypoxia Patient required ICU admission and intubation for respiratory distress. Patient successfully extubated. Weaned to room air. Resolved.  Critical limb ischemia PAD Hyperlipidemia Patient initially admitted for aortobifemoral bypass by vascular surgery. Currently stable. -Continue aspirin 81 mg daily and Crestor 40 mg daily  Complicated UTI Associated fevers. Urine culture (6/3) significant for  E. Coli bacteruria. Patient empirically treated with Zosyn which has transitioned to Keflex. Blood cultures (6/2) with no growth. -Continue 7 day course of antibiotics -Continue Zosyn IV for coverage of above  Urinary incontinence Urine is soaking sacral wound dressing. Purewick is not effective. -Foley placement (6/11 >>  Acute metabolic encephalopathy In setting of ICU admission/shock/UTI. Patient mental status appears to be improved.  Diabetes mellitus, type 2 Uncontrolled with hyperglycemia. Patient is on Januvia, metformin and Wilder Glade as an outpatient. Januvia and [place] held on admission. Started on insulin this admission. Issues with control secondary to tube feeds. Tradjenta started while inpatient. Blood sugar uncontrolled. Discontinued metformin and Tradjenta while inpatient. -Continue Levemir 25 units and SSI q4 hours -Increase to Novolog 4 units TID with meals during the day -Continue Novolog 4 units q4 hours overnight while on tube feeds  Acute diarrhea In setting of tube feedings. Started on imodium. Feces appears to be less liquid.  Severe malnutrition Dysphagia Dietitian consulted. Patient with a Cortrak NG tube on tube feeds. Received TPN while in the ICU, which is now discontinued. Now with plans for PEG tube placement which have been deferred until after the weekend. Malnutrition complicated by dysphagia; speech therapy is on board. Patient continues to have poor oral intake with poor motivation for improving intake. Have petitioned family to be more proactive in patient's nutrition intake. -Dietitian recommendations (6/4): Change to nocturnal tube feeding via Cortrak:  Vital 1.5 at 70 ml/hr x 14 hours (980 ml total) Pro-Source TF 90 mL BID Provides 1630 kcals (85% of estimated needs), 110 g of protein (100% of minimum estimated needs), 749 mL of free water. Free water of 100 mL q 6 hours with TF provides 1148 mL of free water. Continue Juven BID per tube, each packet  provides 80 calories, 8 grams of carbohydrate, 2.5  grams of protein (collagen), 7 grams of L-arginine and 7  grams of L-glutamine; supplement contains CaHMB, Vitamins C, E, B12 and Zinc to promote wound healing. Continue Magic cup TID with meals, each supplement provides 290 kcal and 9 grams of protein. Continue Ensure Plus High Protein po TID, each supplement provides 350 kcal and 20 grams of protein. Feeding assistance at meal times and with supplements. -PEG tube per general surgery -SLP recommendations (6/9): Diet recommendations: Dysphagia 1 (puree);Thin liquid Liquids provided via: Straw Medication Administration: Whole meds with puree Supervision: Staff to assist with self feeding;Full supervision/cueing for compensatory strategies Compensations: Minimize environmental distractions;Slow rate;Small sips/bites Postural Changes and/or Swallow Maneuvers: Seated upright 90 degrees  Sacral ulcer Unstageable. Not present on admission. Wound care consulted. Receiving hydrotherapy.    DVT prophylaxis: Heparin subq Code Status:   Code Status: Full Code Family Communication: None at bedside Disposition Plan: Discharge to SNF pending PEG tube placement, specialist recommendations and bed availability   Consultants:  Cardiology PCCM Vascular surgery  Procedures:  Transthoracic Echocardiogram (5/9) Intubation (5/10 >> 5/20) Limited Transthoracic Echocardiogram (5/10) Cortrak placement (5/17) Limited Transthoracic Echocardiogram (5/21)  Antimicrobials: Zosyn Cephalexin   Subjective: Patient reports no concerns this morning. Not hungry at this time.  Objective: BP 130/68 (BP Location: Left Arm)   Pulse (!) 110   Temp 99 F (37.2 C) (Oral)   Resp 17   Ht '4\' 9"'$  (1.448 m)   Wt 59.5 kg   SpO2 98%   BMI 28.39 kg/m   Examination:  General exam: Appears calm and comfortable Respiratory system: Clear to auscultation. Respiratory effort normal. Cardiovascular system: S1 & S2  heard, RRR. No murmurs, rubs, gallops or clicks. Gastrointestinal system: Abdomen is non-distended, soft and nontender. Normal bowel sounds heard. Central nervous system: Alert. Musculoskeletal: No edema. No calf tenderness Skin: No cyanosis.    Data Reviewed: I have personally reviewed following labs and imaging studies  CBC Lab Results  Component Value Date   WBC 11.9 (H) 04/23/2022   RBC 3.19 (L) 04/23/2022   HGB 9.0 (L) 04/23/2022   HCT 28.7 (L) 04/23/2022   MCV 90.0 04/23/2022   MCH 28.2 04/23/2022   PLT 300 04/23/2022   MCHC 31.4 04/23/2022   RDW 15.8 (H) 04/23/2022   LYMPHSABS 3.9 04/20/2022   MONOABS 0.7 04/20/2022   EOSABS 0.1 04/20/2022   BASOSABS 0.0 76/16/0737     Last metabolic panel Lab Results  Component Value Date   NA 137 04/23/2022   K 3.9 04/23/2022   CL 103 04/23/2022   CO2 25 04/23/2022   BUN 34 (H) 04/23/2022   CREATININE 0.56 04/23/2022   GLUCOSE 191 (H) 04/23/2022   GFRNONAA >60 04/23/2022   GFRAA 78 10/23/2020   CALCIUM 8.6 (L) 04/23/2022   PHOS 3.0 04/08/2022   PROT 6.6 04/21/2022   ALBUMIN 1.9 (L) 04/21/2022   LABGLOB 3.7 03/22/2021   AGRATIO 1.2 03/22/2021   BILITOT 0.3 04/21/2022   ALKPHOS 73 04/21/2022   AST 58 (H) 04/21/2022   ALT 98 (H) 04/21/2022   ANIONGAP 9 04/23/2022    GFR: Estimated Creatinine Clearance: 52 mL/min (by C-G formula based on SCr of 0.56 mg/dL).  Recent Results (from the past 240 hour(s))  Culture, blood (Routine X 2) w Reflex to ID Panel     Status: None   Collection Time: 04/15/22  7:33 PM   Specimen: BLOOD  Result Value Ref Range Status   Specimen Description BLOOD BLOOD LEFT ARM  Final   Special Requests   Final    BOTTLES DRAWN AEROBIC AND  ANAEROBIC Blood Culture adequate volume   Culture   Final    NO GROWTH 5 DAYS Performed at [facility], [place] [address] [place], [place] [address]    Report Status 04/20/2022 FINAL  Final  Culture, blood (Routine X 2) w Reflex to ID Panel     Status:  None   Collection Time: 04/15/22  7:36 PM   Specimen: BLOOD  Result Value Ref Range Status   Specimen Description BLOOD BLOOD LEFT FOREARM  Final   Special Requests   Final    BOTTLES DRAWN AEROBIC ONLY Blood Culture adequate volume   Culture   Final    NO GROWTH 5 DAYS Performed at [facility], [place] [address] [place], [place] [address]    Report Status 04/20/2022 FINAL  Final  Urine Culture     Status: Abnormal   Collection Time: 04/16/22  7:54 AM   Specimen: Urine, Catheterized  Result Value Ref Range Status   Specimen Description URINE, CATHETERIZED  Final   Special Requests   Final    NONE Performed at [facility], [place] [address], [place], [place] [address]    Culture >=100,000 COLONIES/mL ESCHERICHIA COLI (A)  Final   Report Status 04/18/2022 FINAL  Final   Organism ID, Bacteria ESCHERICHIA COLI (A)  Final      Susceptibility   Escherichia coli - MIC*    AMPICILLIN >=32 RESISTANT Resistant     CEFAZOLIN 8 SENSITIVE Sensitive     CEFEPIME <=0.12 SENSITIVE Sensitive     CEFTRIAXONE <=0.25 SENSITIVE Sensitive     CIPROFLOXACIN >=4 RESISTANT Resistant     GENTAMICIN <=1 SENSITIVE Sensitive     IMIPENEM <=0.25 SENSITIVE Sensitive     NITROFURANTOIN <=16 SENSITIVE Sensitive     TRIMETH/SULFA <=20 SENSITIVE Sensitive     AMPICILLIN/SULBACTAM >=32 RESISTANT Resistant     PIP/TAZO <=4 SENSITIVE Sensitive     * >=100,000 COLONIES/mL ESCHERICHIA COLI      Radiology Studies: No results found.    LOS: 34 days    Cordelia Poche, MD [facility] 04/24/2022, 12:55 PM   If 7PM-7AM, please contact night-coverage [email or web address]

## 2022-04-25 ENCOUNTER — Inpatient Hospital Stay (HOSPITAL_COMMUNITY): Payer: BC Managed Care – PPO

## 2022-04-25 LAB — GLUCOSE, CAPILLARY
Glucose-Capillary: 132 mg/dL — ABNORMAL HIGH (ref 70–99)
Glucose-Capillary: 149 mg/dL — ABNORMAL HIGH (ref 70–99)
Glucose-Capillary: 150 mg/dL — ABNORMAL HIGH (ref 70–99)
Glucose-Capillary: 160 mg/dL — ABNORMAL HIGH (ref 70–99)
Glucose-Capillary: 173 mg/dL — ABNORMAL HIGH (ref 70–99)
Glucose-Capillary: 191 mg/dL — ABNORMAL HIGH (ref 70–99)
Glucose-Capillary: 94 mg/dL (ref 70–99)

## 2022-04-25 LAB — CBC
HCT: 30.3 % — ABNORMAL LOW (ref 36.0–46.0)
Hemoglobin: 9.3 g/dL — ABNORMAL LOW (ref 12.0–15.0)
MCH: 28.2 pg (ref 26.0–34.0)
MCHC: 30.7 g/dL (ref 30.0–36.0)
MCV: 91.8 fL (ref 80.0–100.0)
Platelets: 322 10*3/uL (ref 150–400)
RBC: 3.3 MIL/uL — ABNORMAL LOW (ref 3.87–5.11)
RDW: 16.2 % — ABNORMAL HIGH (ref 11.5–15.5)
WBC: 11.9 10*3/uL — ABNORMAL HIGH (ref 4.0–10.5)
nRBC: 0 % (ref 0.0–0.2)

## 2022-04-25 LAB — APTT: aPTT: 28 seconds (ref 24–36)

## 2022-04-25 MED ORDER — SODIUM BICARBONATE 650 MG PO TABS
650.0000 mg | ORAL_TABLET | Freq: Once | ORAL | Status: DC
  Filled 2022-04-25: qty 1

## 2022-04-25 MED ORDER — PANCRELIPASE (LIP-PROT-AMYL) 10440-39150 UNITS PO TABS
20880.0000 [IU] | ORAL_TABLET | Freq: Once | ORAL | Status: DC
  Filled 2022-04-25: qty 2

## 2022-04-25 NOTE — Progress Notes (Signed)
PROGRESS NOTE    Gabrielle [name]  EZM:[id number] DOB: Aug 19, 1956 DOA: 03/21/2022 PCP: Dorna Mai, MD   Brief Narrative: Gabrielle [name] is a 66 y.o. female with PMH significant for DM2, HTN, HLD, PAD rectal cancer. 5/8, patient was admitted under vascular surgery on for critical limb ischemia and underwent emergency aortobifemoral bypass Postoperatively she was monitored in ICU. 5/10 - 5/20, intubated and mechanically ventilated. Course was complicated by shock, stress-induced cardiomyopathy, persistent leukocytosis, paralytic ileus requiring TPN. Ileus subsequently resolved and started on oral diet. 6/2, transferred out off ICU. [facility] service consulted for continued medical management.   Assessment and Plan:  Postoperative cardiogenic shock Acute systolic heart failure Patient required ICU admission and initiation of vasopressin and norepinephrine for management of shock. Cardiology consulted. Serial Transthoracic Echocardiogram significant for EF of 55-60% > 20-25% > 55-60%. Patient diuresed with improvement of clinical status. Shock resolved and vasopressors weaned off. Resolved.  Retroperitoneal hematoma Possible contributed to above problem. Large hematoma measuring 11  x 11.2 x 15.4 cm noted on CT abdomen/pelvis on 6/6. Discussion with vascular surgery with recommendations for antibiotic coverage -Zosyn IV with plan for long term suppressive therapy until hematoma resolves  Abdominal incision wound Evaluated and managed by vascular surgery.  Acute respiratory failure with hypoxia Patient required ICU admission and intubation for respiratory distress. Patient successfully extubated. Weaned to room air. Resolved.  Critical limb ischemia PAD Hyperlipidemia Patient initially admitted for aortobifemoral bypass by vascular surgery. Currently stable. -Continue aspirin 81 mg daily and Crestor 40 mg daily  Complicated UTI Associated fevers. Urine culture (6/3) significant for  E. Coli bacteruria. Patient empirically treated with Zosyn which has transitioned to Keflex. Blood cultures (6/2) with no growth. -Continue 7 day course of antibiotics -Continue Zosyn IV for coverage of above  Urinary incontinence Urine is soaking sacral wound dressing. Purewick is not effective. -Foley placement (6/11 >>  Acute metabolic encephalopathy In setting of ICU admission/shock/UTI. Patient mental status appears to be improved.  Diabetes mellitus, type 2 Uncontrolled with hyperglycemia. Patient is on Januvia, metformin and Wilder Glade as an outpatient. Januvia and [place] held on admission. Started on insulin this admission. Issues with control secondary to tube feeds. Tradjenta started while inpatient. Blood sugar uncontrolled. Discontinued metformin and Tradjenta while inpatient. Better control of blood sugar achieved with current regimen at this time. -Continue Levemir 25 units and SSI q4 hours -Continue Novolog 4 units TID with meals during the day -Continue Novolog 4 units q4 hours overnight while on tube feeds  Acute diarrhea In setting of tube feedings. Started on imodium. Feces appears to be less liquid.  Severe malnutrition Dysphagia Dietitian consulted. Patient with a Cortrak NG tube on tube feeds. Received TPN while in the ICU, which is now discontinued. Now with plans for PEG tube placement which have been deferred until after the weekend. Malnutrition complicated by dysphagia; speech therapy is on board. Patient continues to have poor oral intake with poor motivation for improving intake. Have petitioned family to be more proactive in patient's nutrition intake. -Dietitian recommendations (6/4): Change to nocturnal tube feeding via Cortrak:  Vital 1.5 at 70 ml/hr x 14 hours (980 ml total) Pro-Source TF 90 mL BID Provides 1630 kcals (85% of estimated needs), 110 g of protein (100% of minimum estimated needs), 749 mL of free water. Free water of 100 mL q 6 hours with TF  provides 1148 mL of free water. Continue Juven BID per tube, each packet provides 80 calories, 8 grams of carbohydrate, 2.5  grams of protein (collagen), 7 grams of L-arginine and 7 grams of L-glutamine; supplement contains CaHMB, Vitamins C, E, B12 and Zinc to promote wound healing. Continue Magic cup TID with meals, each supplement provides 290 kcal and 9 grams of protein. Continue Ensure Plus High Protein po TID, each supplement provides 350 kcal and 20 grams of protein. Feeding assistance at meal times and with supplements. -PEG tube per general surgery -SLP recommendations (6/9): Diet recommendations: Dysphagia 1 (puree);Thin liquid Liquids provided via: Straw Medication Administration: Whole meds with puree Supervision: Staff to assist with self feeding;Full supervision/cueing for compensatory strategies Compensations: Minimize environmental distractions;Slow rate;Small sips/bites Postural Changes and/or Swallow Maneuvers: Seated upright 90 degrees  Sacral ulcer Unstageable. Not present on admission. Wound care consulted. Receiving hydrotherapy.    DVT prophylaxis: Heparin subq Code Status:   Code Status: Full Code Family Communication: Husband at bedside Disposition Plan: Discharge to SNF pending PEG tube placement, specialist recommendations and bed availability   Consultants:  Cardiology PCCM Vascular surgery  Procedures:  Transthoracic Echocardiogram (5/9) Intubation (5/10 >> 5/20) Limited Transthoracic Echocardiogram (5/10) Cortrak placement (5/17) Limited Transthoracic Echocardiogram (5/21)  Antimicrobials: Zosyn Cephalexin   Subjective: No issues this morning. No questions.  Objective: BP 130/77 (BP Location: Left Arm)   Pulse (!) 112   Temp 98.5 F (36.9 C) (Oral)   Resp 15   Ht '4\' 9"'$  (1.448 m)   Wt 60.5 kg   SpO2 95%   BMI 28.85 kg/m   Examination:  General exam: Appears calm and comfortable Respiratory system: Clear to auscultation.  Respiratory effort normal. Cardiovascular system: S1 & S2 heard, RRR. No murmurs. Gastrointestinal system: Abdomen is nondistended, soft and nontender. Normal bowel sounds heard. Central nervous system: Alert and oriented. 1/5 BLE strength Musculoskeletal: No calf tenderness Skin: No cyanosis. No rashes   Data Reviewed: I have personally reviewed following labs and imaging studies  CBC Lab Results  Component Value Date   WBC 11.9 (H) 04/25/2022   RBC 3.30 (L) 04/25/2022   HGB 9.3 (L) 04/25/2022   HCT 30.3 (L) 04/25/2022   MCV 91.8 04/25/2022   MCH 28.2 04/25/2022   PLT 322 04/25/2022   MCHC 30.7 04/25/2022   RDW 16.2 (H) 04/25/2022   LYMPHSABS 3.9 04/20/2022   MONOABS 0.7 04/20/2022   EOSABS 0.1 04/20/2022   BASOSABS 0.0 16/38/4665     Last metabolic panel Lab Results  Component Value Date   NA 137 04/23/2022   K 3.9 04/23/2022   CL 103 04/23/2022   CO2 25 04/23/2022   BUN 34 (H) 04/23/2022   CREATININE 0.56 04/23/2022   GLUCOSE 191 (H) 04/23/2022   GFRNONAA >60 04/23/2022   GFRAA 78 10/23/2020   CALCIUM 8.6 (L) 04/23/2022   PHOS 3.0 04/08/2022   PROT 6.6 04/21/2022   ALBUMIN 1.9 (L) 04/21/2022   LABGLOB 3.7 03/22/2021   AGRATIO 1.2 03/22/2021   BILITOT 0.3 04/21/2022   ALKPHOS 73 04/21/2022   AST 58 (H) 04/21/2022   ALT 98 (H) 04/21/2022   ANIONGAP 9 04/23/2022    GFR: Estimated Creatinine Clearance: 52.5 mL/min (by C-G formula based on SCr of 0.56 mg/dL).  Recent Results (from the past 240 hour(s))  Culture, blood (Routine X 2) w Reflex to ID Panel     Status: None   Collection Time: 04/15/22  7:33 PM   Specimen: BLOOD  Result Value Ref Range Status   Specimen Description BLOOD BLOOD LEFT ARM  Final   Special Requests   Final  BOTTLES DRAWN AEROBIC AND ANAEROBIC Blood Culture adequate volume   Culture   Final    NO GROWTH 5 DAYS Performed at [facility], [place] [address] [place], [place] [address]    Report Status 04/20/2022 FINAL  Final   Culture, blood (Routine X 2) w Reflex to ID Panel     Status: None   Collection Time: 04/15/22  7:36 PM   Specimen: BLOOD  Result Value Ref Range Status   Specimen Description BLOOD BLOOD LEFT FOREARM  Final   Special Requests   Final    BOTTLES DRAWN AEROBIC ONLY Blood Culture adequate volume   Culture   Final    NO GROWTH 5 DAYS Performed at [facility], [place] [address] [place], [place] [address]    Report Status 04/20/2022 FINAL  Final  Urine Culture     Status: Abnormal   Collection Time: 04/16/22  7:54 AM   Specimen: Urine, Catheterized  Result Value Ref Range Status   Specimen Description URINE, CATHETERIZED  Final   Special Requests   Final    NONE Performed at [facility], [place] [address] [place], [place] [address]    Culture >=100,000 COLONIES/mL ESCHERICHIA COLI (A)  Final   Report Status 04/18/2022 FINAL  Final   Organism ID, Bacteria ESCHERICHIA COLI (A)  Final      Susceptibility   Escherichia coli - MIC*    AMPICILLIN >=32 RESISTANT Resistant     CEFAZOLIN 8 SENSITIVE Sensitive     CEFEPIME <=0.12 SENSITIVE Sensitive     CEFTRIAXONE <=0.25 SENSITIVE Sensitive     CIPROFLOXACIN >=4 RESISTANT Resistant     GENTAMICIN <=1 SENSITIVE Sensitive     IMIPENEM <=0.25 SENSITIVE Sensitive     NITROFURANTOIN <=16 SENSITIVE Sensitive     TRIMETH/SULFA <=20 SENSITIVE Sensitive     AMPICILLIN/SULBACTAM >=32 RESISTANT Resistant     PIP/TAZO <=4 SENSITIVE Sensitive     * >=100,000 COLONIES/mL ESCHERICHIA COLI      Radiology Studies: No results found.    LOS: 35 days    Cordelia Poche, MD [facility] 04/25/2022, 12:30 PM   If 7PM-7AM, please contact night-coverage [email or web address]

## 2022-04-25 NOTE — Consult Note (Addendum)
[facility] Nurse wound follow up Wound type:Pressure Measurement:7cm x 11cm x 3cm  Wound bed: 80% red, 20% yellow slough Drainage (amount, consistency, odor) Moderate serous to light yellow, may be a by product of MediHoney use. Periwound: intact, dry Dressing procedure/placement/frequency: As most of nonviable tissue has autolytically or mechanically debrided and in light of the moderate drainage, I will discontinue Medihoney in favor of a calcium alginate dressing. This will still be changed once daily and PRN drainage strike-through onto exterior dressing, but will be superior at managing moisture levels at the wound bed. A mattress replacement with low air loss feature is in place as is turning and repositioning to minimize time in the supine position. Heels are intact.  I am in support of continuing hydrotherapy at the current frequency of three times weekly this week, but could also support a step down to twice weekly next week if the PT provider performing that therapy agrees.  [facility] nursing team will continue to follow, and will remain available to this patient, the nursing and medical teams.    Thank you for inviting [place] to participate in this patient's Plan of Care.  Maudie Flakes, MSN, RN, CNS, [place], Serita Grammes, [employer], [employer] phone:  [phone]

## 2022-04-25 NOTE — Progress Notes (Addendum)
Physical Therapy Wound Treatment Patient Details  Name: Gabrielle [name] MRN: [id number] Date of Birth: Sep 21, 1956  Today's Date: 04/25/2022 Time: 3151-7616 Time Calculation (min): 45 min  Subjective  Subjective Assessment Subjective: Pt reporting today was not too bad with hydrotherapy. Patient and Family Stated Goals: to not hurt Date of Onset:  (unknown) Prior Treatments: dressing changes  Pain Score:  Pre-medicated, good pain tolerance today  Wound Assessment  Pressure Injury 03/30/22 Sacrum Posterior;Right;Left Unstageable - Full thickness tissue loss in which the base of the injury is covered by slough (yellow, tan, gray, green or brown) and/or eschar (tan, brown or black) in the wound bed. (Active)  Wound Image  04/18/22 1226  Dressing Type Foam - Lift dressing to assess site every shift;Barrier Film (skin prep);Gauze (Comment);Other (Comment);Alginate 04/25/22 1158  Dressing Clean, Dry, Intact 04/25/22 1158  Dressing Change Frequency Daily 04/25/22 1158  State of Healing Eschar 04/25/22 1158  Site / Wound Assessment Brown;Yellow;Pink;Pale;Black 04/25/22 1158  % Wound base Red or Granulating 80% 04/25/22 1158  % Wound base Yellow/Fibrinous Exudate 15% 04/25/22 1158  % Wound base Black/Eschar 5% 04/25/22 1158  % Wound base Other/Granulation Tissue (Comment) 0% 04/25/22 1158  Peri-wound Assessment Erythema (non-blanchable);Pink;Purple 04/25/22 1158  Wound Length (cm) 10 cm 04/18/22 1226  Wound Width (cm) 11.5 cm 04/18/22 1226  Wound Depth (cm) 1 cm 04/18/22 1226  Wound Surface Area (cm^2) 115 cm^2 04/18/22 1226  Wound Volume (cm^3) 115 cm^3 04/18/22 1226  Tunneling (cm) 0 04/18/22 1226  Undermining (cm) 0 04/18/22 1226  Margins Unattached edges (unapproximated) 04/25/22 1158  Drainage Amount Moderate 04/25/22 1158  Drainage Description Serosanguineous 04/25/22 1158  Treatment Debridement (Selective);Irrigation;Packing (Dry gauze);Other (Comment) 04/25/22 1158    Hydrotherapy Pulsed lavage therapy - wound location: sacral Pulsed Lavage with Suction (psi): 12 psi Pulsed Lavage with Suction - Normal Saline Used: 1000 mL Pulsed Lavage Tip: Tip with splash shield Selective Debridement Selective Debridement - Location: sacral Selective Debridement - Tools Used: Forceps, Scalpel, Scissors Selective Debridement - Tissue Removed: Black and yellow necrotic tissue    Wound Assessment and Plan  Wound Therapy - Assess/Plan/Recommendations Wound Therapy - Clinical Statement: RN provided fentanyk to pt at start of hydrotherapy per recommendation to RN by MD, appearing to have improved her pain tolerance today as she reported it was not too bad today. Pt's wound continues to make gradual progress with the removal of necrotic tissue as there was a very small amount of black necrotic tissue remaining and some loose yellow slough in the deeper aspect of the wound. Pt would continue to benefit from hydrotherapy to further remove necrotic tissue and improve wound healing. Wound Therapy - Functional Problem List: functional debility after prolonged time in bed. Factors Delaying/Impairing Wound Healing: Immobility, Multiple medical problems, Vascular compromise, Diabetes Mellitus Hydrotherapy Plan: Dressing change, Debridement, Pulsatile lavage with suction, Patient/family education Wound Therapy - Frequency: 3X / week (M, W, F) Wound Therapy - Current Recommendations: PT Wound Therapy - Follow Up Recommendations: dressing changes by RN, dressing changes by family/patient  Wound Therapy Goals- Improve the function of patient's integumentary system by progressing the wound(s) through the phases of wound healing (inflammation - proliferation - remodeling) by: Wound Therapy Goals - Improve the function of patient's integumentary system by progressing the wound(s) through the phases of wound healing by: Decrease Necrotic Tissue to: 10 percent Decrease Necrotic Tissue -  Progress: Progressing toward goal Increase Granulation Tissue to: 90% Increase Granulation Tissue - Progress: Progressing toward goal Improve Drainage Characteristics: Min,  Serous Improve Drainage Characteristics - Progress: Progressing toward goal Goals/treatment plan/discharge plan were made with and agreed upon by patient/family: Yes Time For Goal Achievement: 7 days Wound Therapy - Potential for Goals: Good  Goals will be updated until maximal potential achieved or discharge criteria met.  Discharge criteria: when goals achieved, discharge from hospital, MD decision/surgical intervention, no progress towards goals, refusal/missing three consecutive treatments without notification or medical reason.  GP     Charges PT Wound Care Charges $Wound Debridement up to 20 cm: < or equal to 20 cm $ Wound Debridement each add'l 20 sqcm: 1 $PT Hydrotherapy Dressing: 1 dressing $PT Hydrotherapy Visit: 1 Visit  Moishe Spice, PT, DPT Acute Rehabilitation Services  Office: [phone]       Orvan Falconer 04/25/2022, 12:08 PM

## 2022-04-25 NOTE — Procedures (Signed)
Cortrak  Person Inserting Tube:  Alroy Dust, Devine Dant L, RD Tube Type:  Cortrak - 43 inches Tube Size:  10 Tube Location:  Right nare Secured by: Bridle Technique Used to Measure Tube Placement:  Marking at nare/corner of mouth Cortrak Secured At:  65 cm   Cortrak Tube Team Note:  Consult received a page to [employer] a Cortrak feeding tube. RN and RD unable to unclog tube. Cortrak team replaced tube.   X-ray is required, abdominal x-ray has been ordered by the Cortrak team. Please confirm tube placement before using the Cortrak tube.   If the tube becomes dislodged please keep the tube and contact the Cortrak team at [email or web address] ([email or web address]) for replacement.  If after hours and replacement cannot be delayed, place a NG tube and confirm placement with an abdominal x-ray.    Hermina Barters RD, LDN Clinical Dietitian See Shea Evans for contact information.

## 2022-04-25 NOTE — Progress Notes (Addendum)
Progress Note    04/25/2022 7:38 AM 65 Days Post-Op  Subjective:  no complaints; alert  Afebrile HR 100's-110's  425'Z-563'O systolic 75% RA  Vitals:   04/24/22 2309 04/25/22 0302  BP: (!) 141/82 135/84  Pulse: 99 (!) 101  Resp:    Temp: 98.4 F (36.9 C) 98.5 F (36.9 C)  SpO2:  95%    Physical Exam: Cardiac:  tachy Lungs:  non labored on RA Incisions:  midline incision is clean. Bandage in place distally Extremities:  bilateral feet warm and well perfused.  Abdomen:  soft  CBC    Component Value Date/Time   WBC 11.9 (H) 04/25/2022 0545   RBC 3.30 (L) 04/25/2022 0545   HGB 9.3 (L) 04/25/2022 0545   HGB 14.5 03/22/2021 0916   HCT 30.3 (L) 04/25/2022 0545   HCT 43.9 03/22/2021 0916   PLT 322 04/25/2022 0545   PLT 261 03/22/2021 0916   MCV 91.8 04/25/2022 0545   MCV 90 03/22/2021 0916   MCH 28.2 04/25/2022 0545   MCHC 30.7 04/25/2022 0545   RDW 16.2 (H) 04/25/2022 0545   RDW 12.5 03/22/2021 0916   LYMPHSABS 3.9 04/20/2022 0600   MONOABS 0.7 04/20/2022 0600   EOSABS 0.1 04/20/2022 0600   BASOSABS 0.0 04/20/2022 0600    BMET    Component Value Date/Time   NA 137 04/23/2022 0500   NA 137 03/22/2021 0916   K 3.9 04/23/2022 0500   CL 103 04/23/2022 0500   CO2 25 04/23/2022 0500   GLUCOSE 191 (H) 04/23/2022 0500   BUN 34 (H) 04/23/2022 0500   BUN 11 03/22/2021 0916   CREATININE 0.56 04/23/2022 0500   CREATININE 0.74 11/11/2015 1921   CALCIUM 8.6 (L) 04/23/2022 0500   GFRNONAA >60 04/23/2022 0500   GFRNONAA >89 11/11/2015 1921   GFRAA 78 10/23/2020 1701   GFRAA >89 11/11/2015 1921    INR    Component Value Date/Time   INR 1.1 03/27/2022 0510     Intake/Output Summary (Last 24 hours) at 04/25/2022 0738 Last data filed at 04/25/2022 6433 Gross per 24 hour  Intake 1861.03 ml  Output 2450 ml  Net -588.97 ml     Assessment/Plan:  66 y.o. female is s/p:  Aortobifemoral bypass graft  65 Days Post-Op   -midline incision healing-will come  back and change wet to dry dressing -bilateral feet are warm and well perfused without ischemic changes -hydrotherapy for sacral decubitus -continue TF for nutrition -DVT prophylaxis:  sq heparin   Leontine Locket, PA-C [facility] [phone] 04/25/2022 7:38 AM  VASCULAR STAFF ADDENDUM: I have independently interviewed and examined the patient. I agree with the above.  Pt confided in me she wants to go home.  I spent a considerable amount of time discussing her current progress with her husband.  I think she is progressing appropriately. I spoke to wound care ostomy today, and have talked to general surgery about her sacral decubitus ulcer.  There is a possibility for debridement later this week.  I will discuss this further with them tomorrow. Patient will also need a PEG.  The midline wound appears improved from last week.  No concerns for intra-abdominal process at this time, will discuss the role of PEG in the coming days as well. Patient will require intensive rehab, and is aware she will have a long road ahead.  I do think she will will be able to make a full recovery should she continue to progress without issue.  Cassandria Santee, MD [facility] Phone Number: [phone] 04/25/2022 5:07 PM

## 2022-04-26 LAB — GLUCOSE, CAPILLARY
Glucose-Capillary: 156 mg/dL — ABNORMAL HIGH (ref 70–99)
Glucose-Capillary: 150 mg/dL — ABNORMAL HIGH (ref 70–99)
Glucose-Capillary: 171 mg/dL — ABNORMAL HIGH (ref 70–99)
Glucose-Capillary: 98 mg/dL (ref 70–99)
Glucose-Capillary: 143 mg/dL — ABNORMAL HIGH (ref 70–99)

## 2022-04-26 LAB — APTT
aPTT: >200 seconds (ref 24–36)
aPTT: 81 seconds — ABNORMAL HIGH (ref 24–36)

## 2022-04-26 MED ORDER — DAKINS (1/4 STRENGTH) 0.125 % EX SOLN
Freq: Every day | CUTANEOUS | Status: DC
Start: 2022-04-26 — End: 2022-05-01
  Filled 2022-04-26: qty 473

## 2022-04-26 NOTE — Progress Notes (Addendum)
PROGRESS NOTE    Gabrielle [name]  WFU:[id number] DOB: [birth year] DOA: 03/21/2022 PCP: Dorna Mai, MD   Brief Narrative: Gabrielle [name] is a 66 y.o. female with PMH significant for DM2, HTN, HLD, PAD rectal cancer. 5/8, patient was admitted under vascular surgery on for critical limb ischemia and underwent emergency aortobifemoral bypass. Postoperatively she was monitored in ICU. 5/10 - 5/20, intubated and mechanically ventilated. Course was complicated by shock, stress-induced cardiomyopathy, persistent leukocytosis, paralytic ileus requiring TPN. Ileus subsequently resolved and started on oral diet. 6/2, transferred out off ICU. [facility] service consulted for continued medical management. Patient found to have a large retroperitoneal hematoma. UTI on urine culture. Zosyn IV for treatment of UTI and suppressive therapy of hematoma per vascular surgery recommendations. Severe malnutrition s/p Cortrak with possible need for PEG tube placement.   Assessment and Plan:  Postoperative cardiogenic shock Acute systolic heart failure Patient required ICU admission and initiation of vasopressin and norepinephrine for management of shock. Cardiology consulted. Serial Transthoracic Echocardiogram significant for EF of 55-60% > 20-25% > 55-60%. Patient diuresed with improvement of clinical status. Shock resolved and vasopressors weaned off. Resolved.  Retroperitoneal hematoma Possible contributed to above problem. Large hematoma measuring 11  x 11.2 x 15.4 cm noted on CT abdomen/pelvis on 6/6. Discussion with vascular surgery with recommendations for antibiotic coverage -Zosyn IV with plan for long term suppressive therapy until hematoma resolves  Abdominal incision wound Evaluated and managed by vascular surgery.  Acute respiratory failure with hypoxia Patient required ICU admission and intubation for respiratory distress. Patient successfully extubated. Weaned to room air. Resolved.  Critical  limb ischemia PAD Hyperlipidemia Patient initially admitted for aortobifemoral bypass by vascular surgery. Currently stable. -Continue aspirin 81 mg daily and Crestor 40 mg daily  Elevated aPTT Unsure of etiology. Patient is on heparin 5000 units subcutaneously TID. aPTT has been trending normally. -Recheck aPTT this morning  Complicated UTI, resolved Associated fevers. Urine culture (6/3) significant for E. Coli bacteruria. Patient empirically treated with Zosyn which has transitioned to Keflex. Blood cultures (6/2) with no growth. Completed antibiotic course for treatment.  Urinary incontinence Urine is soaking sacral wound dressing. Purewick is not effective. -Foley placement (6/11 >>  Acute metabolic encephalopathy In setting of ICU admission/shock/UTI. Patient mental status appears to be improved.  Diabetes mellitus, type 2 Uncontrolled with hyperglycemia. Patient is on Januvia, metformin and Wilder Glade as an outpatient. Januvia and [place] held on admission. Started on insulin this admission. Issues with control secondary to tube feeds. Tradjenta started while inpatient. Blood sugar uncontrolled. Discontinued metformin and Tradjenta while inpatient. Better control of blood sugar achieved with current regimen at this time. -Continue Levemir 25 units and SSI q4 hours -Continue Novolog 4 units TID with meals during the day -Continue Novolog 4 units q4 hours overnight while on tube feeds  Acute diarrhea In setting of tube feedings. Started on imodium. Feces appears to be less liquid.  Severe malnutrition Dysphagia Dietitian consulted. Patient with a Cortrak NG tube on tube feeds. Received TPN while in the ICU, which is now discontinued. Now with plans for PEG tube placement which have been deferred until after the weekend per vascular surgery. Malnutrition complicated by dysphagia; speech therapy is on board. Patient continues to have poor oral intake with poor motivation for improving  intake. Have petitioned family to be more proactive in patient's nutrition intake. -Dietitian recommendations (6/4): Change to nocturnal tube feeding via Cortrak:  Vital 1.5 at 70 ml/hr x 14 hours (980 ml total)  Pro-Source TF 90 mL BID Provides 1630 kcals (85% of estimated needs), 110 g of protein (100% of minimum estimated needs), 749 mL of free water. Free water of 100 mL q 6 hours with TF provides 1148 mL of free water. Continue Juven BID per tube, each packet provides 80 calories, 8 grams of carbohydrate, 2.5  grams of protein (collagen), 7 grams of L-arginine and 7 grams of L-glutamine; supplement contains CaHMB, Vitamins C, E, B12 and Zinc to promote wound healing. Continue Magic cup TID with meals, each supplement provides 290 kcal and 9 grams of protein. Continue Ensure Plus High Protein po TID, each supplement provides 350 kcal and 20 grams of protein. Feeding assistance at meal times and with supplements. -PEG tube per general surgery -SLP recommendations (6/9): Diet recommendations: Dysphagia 1 (puree);Thin liquid Liquids provided via: Straw Medication Administration: Whole meds with puree Supervision: Staff to assist with self feeding;Full supervision/cueing for compensatory strategies Compensations: Minimize environmental distractions;Slow rate;Small sips/bites Postural Changes and/or Swallow Maneuvers: Seated upright 90 degrees  Sacral ulcer Unstageable. Not present on admission. Wound care consulted. Receiving hydrotherapy.    DVT prophylaxis: Heparin subq Code Status:   Code Status: Full Code Family Communication: None at bedside Disposition Plan: Discharge to SNF pending PEG tube placement, specialist recommendations and bed availability   Consultants:  Cardiology PCCM Vascular surgery  Procedures:  Transthoracic Echocardiogram (5/9) Intubation (5/10 >> 5/20) Limited Transthoracic Echocardiogram (5/10) Cortrak placement (5/17) Limited Transthoracic  Echocardiogram (5/21)  Antimicrobials: Zosyn Cephalexin   Subjective: Patient is asking what has to happen for her to be able to leave the hospital. She seems to be motivated to leave.  Objective: BP (!) 112/95 (BP Location: Left Arm)   Pulse (!) 105   Temp 98.1 F (36.7 C) (Oral)   Resp 14   Ht '4\' 9"'$  (1.448 m)   Wt 72.5 kg   SpO2 100%   BMI 34.59 kg/m   Examination:  General exam: Appears calm and comfortable Respiratory system: Clear to auscultation. Respiratory effort normal. Cardiovascular system: S1 & S2 heard, RRR. No murmurs, rubs. Gastrointestinal system: Abdomen is nondistended, soft and nontender. Normal bowel sounds heard. Central nervous system: Alert and oriented. Musculoskeletal: No edema. No calf tenderness Psychiatry: Judgement and insight appear normal. Mood & affect appropriate.     Data Reviewed: I have personally reviewed following labs and imaging studies  CBC Lab Results  Component Value Date   WBC 11.9 (H) 04/25/2022   RBC 3.30 (L) 04/25/2022   HGB 9.3 (L) 04/25/2022   HCT 30.3 (L) 04/25/2022   MCV 91.8 04/25/2022   MCH 28.2 04/25/2022   PLT 322 04/25/2022   MCHC 30.7 04/25/2022   RDW 16.2 (H) 04/25/2022   LYMPHSABS 3.9 04/20/2022   MONOABS 0.7 04/20/2022   EOSABS 0.1 04/20/2022   BASOSABS 0.0 21/30/8657     Last metabolic panel Lab Results  Component Value Date   NA 137 04/23/2022   K 3.9 04/23/2022   CL 103 04/23/2022   CO2 25 04/23/2022   BUN 34 (H) 04/23/2022   CREATININE 0.56 04/23/2022   GLUCOSE 191 (H) 04/23/2022   GFRNONAA >60 04/23/2022   GFRAA 78 10/23/2020   CALCIUM 8.6 (L) 04/23/2022   PHOS 3.0 04/08/2022   PROT 6.6 04/21/2022   ALBUMIN 1.9 (L) 04/21/2022   LABGLOB 3.7 03/22/2021   AGRATIO 1.2 03/22/2021   BILITOT 0.3 04/21/2022   ALKPHOS 73 04/21/2022   AST 58 (H) 04/21/2022   ALT 98 (H) 04/21/2022  ANIONGAP 9 04/23/2022    GFR: Estimated Creatinine Clearance: 57.8 mL/min (by C-G formula based on SCr  of 0.56 mg/dL).  No results found for this or any previous visit (from the past 240 hour(s)).     Radiology Studies: DG Abd Portable 1V  Result Date: 04/25/2022 CLINICAL DATA:  Feeding tube placement. EXAM: PORTABLE ABDOMEN - 1 VIEW COMPARISON:  04/07/2022. FINDINGS: Feeding tube is at the pylorus or within the duodenal bulb. IMPRESSION: The tube is at the pylorus or duodenal bulb. Electronically Signed   By: Lorin Picket M.D.   On: 04/25/2022 15:45      LOS: 36 days    Cordelia Poche, MD [facility] 04/26/2022, 9:42 AM   If 7PM-7AM, please contact night-coverage [email or web address]

## 2022-04-26 NOTE — Progress Notes (Signed)
Speech Language Pathology Treatment: Dysphagia;Cognitive-Linquistic  Patient Details Name: Gabrielle [name] MRN: [id number] DOB: December 18, 1955 Today's Date: 04/26/2022 Time: 1030-1053 SLP Time Calculation (min) (ACUTE ONLY): 23 min  Assessment / Plan / Recommendation Clinical Impression  Pt has made progress since this therapist last worked with her. She has been upgraded to thin liquid and tolerated well today. No concern noted for delayed swallow or aspiration with straw presentation thin water. Did not attempt to donn dentures this session. Husband stated last time they had a hard time getting pt to follow directions to get them out when she was more confused. Past session SLP attempted to donn and she was gagging. Pt stated she ate with dentures prior to admission although it depended on what she was eating as to whether dentures were placed. Discussed with pt and spouse that goal is to donn dentures to advance her diet from puree.   Cognitively she has improved. She is less distracted by her environment and internal situation. Responses mildly delayed versus significantly in previous session. She was independently oriented to year, place, and month. Cues needed to use calendar for day of week and date- husband retrieved her glasses and she donned making sure they were on face properly and able to read calendar. Basic verbal problem solving for one response did not need cueing. Pt looking away from therapist and SLP looked at her with pt responding "I'm not ignoring you" and was able to recall question to accurately respond and another instance she did need repetition. SLP continue working with pt.    HPI HPI: Patient is a 66 y/o female who presents on 5/8 for open aortobifemoral bypass and Bilateral femoral endarterectomy complicated by MI, heart failure and shock, intubated 5/10-5/20. PMH includes rectal cancer, DM2, HTN.      SLP Plan  Continue with current plan of care      Recommendations  for follow up therapy are one component of a multi-disciplinary discharge planning process, led by the attending physician.  Recommendations may be updated based on patient status, additional functional criteria and insurance authorization.    Recommendations  Diet recommendations: Dysphagia 1 (puree);Thin liquid Liquids provided via: Straw;Cup Medication Administration: Whole meds with puree Supervision: Staff to assist with self feeding;Full supervision/cueing for compensatory strategies Compensations: Minimize environmental distractions;Slow rate;Small sips/bites Postural Changes and/or Swallow Maneuvers: Seated upright 90 degrees                Oral Care Recommendations: Oral care BID Follow Up Recommendations: Skilled nursing-short term rehab (<3 hours/day) Assistance recommended at discharge: Frequent or constant Supervision/Assistance SLP Visit Diagnosis: Dysphagia, oropharyngeal phase (R13.12);Cognitive communication deficit (R41.841) Plan: Continue with current plan of care           Gabrielle [name]  04/26/2022, 11:00 AM

## 2022-04-26 NOTE — Plan of Care (Signed)
  Problem: Education: Goal: Knowledge of General Education information will improve Description: Including pain rating scale, medication(s)/side effects and non-pharmacologic comfort measures Outcome: Progressing   Problem: Health Behavior/Discharge Planning: Goal: Ability to manage health-related needs will improve Outcome: Progressing   Problem: Clinical Measurements: Goal: Ability to maintain clinical measurements within normal limits will improve Outcome: Progressing Goal: Will remain free from infection Outcome: Progressing Goal: Diagnostic test results will improve Outcome: Progressing Goal: Respiratory complications will improve Outcome: Progressing Goal: Cardiovascular complication will be avoided Outcome: Progressing   Problem: Activity: Goal: Risk for activity intolerance will decrease Outcome: Progressing   Problem: Nutrition: Goal: Adequate nutrition will be maintained Outcome: Progressing   Problem: Coping: Goal: Level of anxiety will decrease Outcome: Progressing   Problem: Elimination: Goal: Will not experience complications related to bowel motility Outcome: Progressing   Problem: Pain Managment: Goal: General experience of comfort will improve Outcome: Progressing   Problem: Safety: Goal: Ability to remain free from injury will improve Outcome: Progressing   Problem: Skin Integrity: Goal: Risk for impaired skin integrity will decrease Outcome: Progressing   Problem: Education: Goal: Ability to describe self-care measures that may prevent or decrease complications (Diabetes Survival Skills Education) will improve Outcome: Progressing Goal: Individualized Educational Video(s) Outcome: Progressing   Problem: Coping: Goal: Ability to adjust to condition or change in health will improve Outcome: Progressing   Problem: Fluid Volume: Goal: Ability to maintain a balanced intake and output will improve Outcome: Progressing   Problem: Health  Behavior/Discharge Planning: Goal: Ability to identify and utilize available resources and services will improve Outcome: Progressing Goal: Ability to manage health-related needs will improve Outcome: Progressing   Problem: Metabolic: Goal: Ability to maintain appropriate glucose levels will improve Outcome: Progressing   Problem: Nutritional: Goal: Maintenance of adequate nutrition will improve Outcome: Progressing Goal: Progress toward achieving an optimal weight will improve Outcome: Progressing   Problem: Skin Integrity: Goal: Risk for impaired skin integrity will decrease Outcome: Progressing   Problem: Tissue Perfusion: Goal: Adequacy of tissue perfusion will improve Outcome: Progressing

## 2022-04-26 NOTE — Progress Notes (Signed)
Date and time results received: 04/26/22 8:21 AM   Test: PTT Critical Value: >200sec  Name of Provider Notified: Cordelia Poche MD Face to Face  Orders Received? Or Actions Taken?: No new orders at this time

## 2022-04-26 NOTE — Progress Notes (Addendum)
[facility] [place]  Subjective  - More verbally interactive this am   Objective (!) 112/95 (!) 105 98.1 F (36.7 C) (Oral) 14 100%  Intake/Output Summary (Last 24 hours) at 04/26/2022 0801 Last data filed at 04/26/2022 0728 Gross per 24 hour  Intake 480 ml  Output 2200 ml  Net -1720 ml    Abdomin soft, distal incision beefy red open wound base.  Wet to dry dressing replaced.  Old packing with greenish discoloration. Feet warm well perfused, minimal motor with mild edema Speech clear Lungs non labored breathing  Assessment/Planning: 66 y.o. female is s/p:  Aortobifemoral bypass graft  35 Days Post-Op  Distal abdominal incision continue wet to dry dressings daily with change saline to Dakins solution wet to dry. Encourage mobility of LE while in the bed Pending sacral care with possible debridement and PEG tube placement -continue TF for nutrition -DVT prophylaxis:  sq heparin   Roxy Horseman 04/26/2022 8:01 AM --  VASCULAR STAFF ADDENDUM: I have independently interviewed and examined the patient. I agree with the above.  Pt appeared back to normal from a personality standpoint which was encouraging.  Midline abd wound clean Spoke to general surgery about sacral wound - they will see today. Will call and talk to SLP and PT regarding timelines  Will continue to hold on PEG as Oral intake improving Pt with severe deconditioning but overall improving slowly.  Appreciate support from multidisciplinary team.    Cassandria Santee, MD [facility] Phone Number: [phone] 04/26/2022 1:20 PM    Laboratory Lab Results: Recent Labs    04/25/22 0545  WBC 11.9*  HGB 9.3*  HCT 30.3*  PLT 322   BMET No results for input(s): "NA", "K", "CL", "CO2", "GLUCOSE", "BUN", "CREATININE", "CALCIUM" in the last 72 hours.  COAG Lab Results  Component Value Date   INR 1.1 03/27/2022   INR 1.2  03/26/2022   INR 1.1 03/25/2022   No results found for: "PTT"

## 2022-04-26 NOTE — Progress Notes (Signed)
Physical Therapy Treatment Patient Details Name: Gabrielle [name] MRN: [id number] DOB: [birth year] Today's Date: 04/26/2022   History of Present Illness Pt is a 66 y.o. female admitted 03/21/22 for open aortobifemoral bypass and bilateral femoral endarterectomy, complicated by MI, HF, shock. ETT 5/10-5/20. Pt with waxing/waning mentation; head CT 5/26 negative for acute abnormality. Pt also with sacral wound and hydrotherapy following since 04/04/22. PMH includes rectal CA, DM2, HTN.    PT Comments    Pt was much more awake and verbal this morning prior to receiving pain meds. Unfortunately, pt was having too much pain in her sacrum to stay in the chair at the end of her PT session and was thus returned to bed for her safety and per her request. Pt was able to express her desires and pain, but with increased time to process and respond. She remains disoriented to date, location, and situation. Pt requiring maxAx2 to come to stand in the stedy and continues to display lower extremity muscular weakness and endurance deficits that impact her ability to come to stand and remain standing for longer periods of time. Performed a few lower extremity exercises and stretches to address this. Will continue to follow acutely. Current recommendations remain appropriate.     Recommendations for follow up therapy are one component of a multi-disciplinary discharge planning process, led by the attending physician.  Recommendations may be updated based on patient status, additional functional criteria and insurance authorization.  Follow Up Recommendations  Skilled nursing-short term rehab (<3 hours/day)     Assistance Recommended at Discharge Frequent or constant Supervision/Assistance  Patient can return home with the following Two people to help with walking and/or transfers;Two people to help with bathing/dressing/bathroom;Assistance with cooking/housework;Assist for transportation;Direct supervision/assist for  financial management;Help with stairs or ramp for entrance;Direct supervision/assist for medications management   Equipment Recommendations  Wheelchair (measurements PT);Wheelchair cushion (measurements PT);Hospital bed;Other (comment) (hoyer lift; petite size for WC as pt is 17f 9")    Recommendations for Other Services       Precautions / Restrictions Precautions Precautions: Fall;Other (comment) Precaution Comments: cortrak (PM feeds only), flexiseal (falls out easily), sacral wound; RUE restricted/no BP;  L brachial BP painful try wrist Restrictions Weight Bearing Restrictions: No     Mobility  Bed Mobility Overal bed mobility: Needs Assistance Bed Mobility: Rolling Rolling: Max assist, Mod assist         General bed mobility comments: Pt needing increased time and cues with hand-over-hand guidance of UEs to bed rails to pull to roll. Pt needing assistance at legs to roll. Up to mod-maxA to roll either direction 2x    Transfers Overall transfer level: Needs assistance Equipment used: Ambulation equipment used Transfers: Sit to/from Stand, Bed to chair/wheelchair/BSC Sit to Stand: Max assist, +2 physical assistance, +2 safety/equipment           General transfer comment: Maxisky used to dependently transfer pt bed > recliner. Then performed x2 sit to stand from recliner > stedy with maxAx2 and use of bed pads as sling for assistance at hips to reduce pressure/pain at sacral wound and reduce risk of pulling flexiseal tube. Improved ability to extend elbows to push up off stedy rail today, but pt now pushing self posteriorly. Pt unable to get comfortable in chair despite repositioning multiple times and pt was restless, thus for safety and per pt request pt was dependently lifted via maxisky back to bed. Transfer via Lift Equipment: MCamelia Eng Ambulation/Gait  General Gait Details: Social worker Rankin (Stroke Patients Only)       Balance Overall balance assessment: Needs assistance Sitting-balance support: Bilateral upper extremity supported Sitting balance-Leahy Scale: Poor Sitting balance - Comments: Leaning posteriorly and laterally either direction, apparently to offload sacral wound Postural control: Left lateral lean, Posterior lean, Right lateral lean Standing balance support: Bilateral upper extremity supported Standing balance-Leahy Scale: Zero Standing balance comment: MaxAx2 and bil UE support to stand briefly 2x in stedy                            Cognition Arousal/Alertness: Awake/alert Behavior During Therapy: Flat affect Overall Cognitive Status: Impaired/Different from baseline Area of Impairment: Attention, Following commands, Safety/judgement, Problem solving, Awareness, Orientation                 Orientation Level: Disoriented to, Time, Situation, Place Current Attention Level: Sustained   Following Commands: Follows one step commands inconsistently, Follows one step commands with increased time Safety/Judgement: Decreased awareness of safety, Decreased awareness of deficits Awareness: Intellectual Problem Solving: Slow processing, Decreased initiation, Difficulty sequencing, Requires verbal cues, Requires tactile cues General Comments: Pt awake and following directions, but inconsistently at times, needing increased time for processing. Likely inconsistent at times due to wanting to avoid some movements due to sacral pain. Pt much more verbal today expressing her discomfort and her desires. Pt reported she thought the month was "April". Needed increased time and cues to problem-solve which hospital she was in.        Exercises General Exercises - Lower Extremity Long Arc Quad: AROM, Strengthening, Both, Other reps (comment), Seated (x3) Other Exercises Other Exercises: gentle hamstring stretch in bed bil 1-2x     General Comments General comments (skin integrity, edema, etc.): requested RN to order donut seat cushion for pt      Pertinent Vitals/Pain Pain Assessment Pain Assessment: Faces Faces Pain Scale: Hurts even more Pain Location: sacrum Pain Descriptors / Indicators: Restless, Grimacing, Guarding, Moaning Pain Intervention(s): Limited activity within patient's tolerance, Monitored during session, Repositioned    Home Living                          Prior Function            PT Goals (current goals can now be found in the care plan section) Acute Rehab PT Goals Patient Stated Goal: to get OOB PT Goal Formulation: With patient/family Time For Goal Achievement: 05/02/22 Potential to Achieve Goals: Fair Progress towards PT goals: Progressing toward goals    Frequency    Min 2X/week      PT Plan Current plan remains appropriate    Co-evaluation              AM-PAC PT "6 Clicks" Mobility   Outcome Measure  Help needed turning from your back to your side while in a flat bed without using bedrails?: A Lot Help needed moving from lying on your back to sitting on the side of a flat bed without using bedrails?: Total Help needed moving to and from a bed to a chair (including a wheelchair)?: Total Help needed standing up from a chair using your arms (e.g., wheelchair or bedside chair)?: Total Help needed to walk in hospital room?: Total Help needed climbing 3-5 steps with  a railing? : Total 6 Click Score: 7    End of Session Equipment Utilized During Treatment: Gait belt Activity Tolerance: Patient limited by pain Patient left: with call bell/phone within reach;in bed;with family/visitor present (offloading sacrum rolled pt to L end of session) Nurse Communication: Mobility status;Need for lift equipment;Other (comment) (request for donut cushion) PT Visit Diagnosis: Muscle weakness (generalized) (M62.81);Other abnormalities of gait and mobility  (R26.89);Pain;Unsteadiness on feet (R26.81);Difficulty in walking, not elsewhere classified (R26.2) Pain - part of body:  (sacrum)     Time: 1157-2620 PT Time Calculation (min) (ACUTE ONLY): 57 min  Charges:  $Therapeutic Exercise: 8-22 mins $Therapeutic Activity: 38-52 mins                     Moishe Spice, PT, DPT Acute Rehabilitation Services  Office: [place] 04/26/2022, 11:01 AM

## 2022-04-27 LAB — GLUCOSE, CAPILLARY
Glucose-Capillary: 103 mg/dL — ABNORMAL HIGH (ref 70–99)
Glucose-Capillary: 138 mg/dL — ABNORMAL HIGH (ref 70–99)
Glucose-Capillary: 142 mg/dL — ABNORMAL HIGH (ref 70–99)
Glucose-Capillary: 149 mg/dL — ABNORMAL HIGH (ref 70–99)
Glucose-Capillary: 182 mg/dL — ABNORMAL HIGH (ref 70–99)
Glucose-Capillary: 88 mg/dL (ref 70–99)
Glucose-Capillary: 93 mg/dL (ref 70–99)

## 2022-04-27 LAB — APTT: aPTT: 30 seconds (ref 24–36)

## 2022-04-27 MED ORDER — MEDIHONEY WOUND/BURN DRESSING EX PSTE
1.0000 "application " | PASTE | Freq: Every day | CUTANEOUS | Status: DC
  Administered 2022-04-27 – 2022-05-01 (×5): 1 via TOPICAL
  Filled 2022-04-27: qty 44

## 2022-04-27 MED ORDER — PROSOURCE TF PO LIQD
45.0000 mL | Freq: Three times a day (TID) | ORAL | Status: DC
Start: 2022-04-27 — End: 2022-05-01
  Administered 2022-04-27 – 2022-05-01 (×11): 45 mL
  Filled 2022-04-27 (×12): qty 45

## 2022-04-27 MED ORDER — GLUCERNA 1.5 CAL PO LIQD
996.0000 mL | ORAL | Status: DC
  Administered 2022-04-27 – 2022-04-30 (×4): 996 mL
  Filled 2022-04-27 (×5): qty 1000

## 2022-04-27 NOTE — Progress Notes (Signed)
Patient ID: SHARADA ALBORNOZ, female   DOB: [birth year], 66 y.o.   MRN: [id number] [facility] Surgery Progress Note  37 Days Post-Op  Subjective: CC-  No complaints.  Objective: Vital signs in last 24 hours: Temp:  [97.9 F (36.6 C)-98.6 F (37 C)] 97.9 F (36.6 C) (06/14 1138) Pulse Rate:  [93-106] 95 (06/14 1138) Resp:  [14-20] 20 (06/14 1138) BP: (114-138)/(60-96) 114/70 (06/14 1138) SpO2:  [95 %-100 %] 95 % (06/14 1138) Last BM Date : 04/26/22  Intake/Output from previous day: 06/13 0701 - 06/14 0700 In: 3663.8 [P.O.:699; I.V.:60.6; NG/GT:2460.3; IV Piggyback:323.9] Out: 1725 [Urine:725; Stool:1000] Intake/Output this shift: Total I/O In: -  Out: 400 [Urine:400]  PE: Gen:  Alert, NAD, pleasant GU: large sacral wound with mostly healthy granulation tissue, <10% fibrinous tissue at base, small amount of undermining at 11 o'clock and 1-2 o'clock. No tunneling, no purulent drainage    Lab Results:  Recent Labs    04/25/22 0545  WBC 11.9*  HGB 9.3*  HCT 30.3*  PLT 322   BMET No results for input(s): "NA", "K", "CL", "CO2", "GLUCOSE", "BUN", "CREATININE", "CALCIUM" in the last 72 hours. PT/INR No results for input(s): "LABPROT", "INR" in the last 72 hours. CMP     Component Value Date/Time   NA 137 04/23/2022 0500   NA 137 03/22/2021 0916   K 3.9 04/23/2022 0500   CL 103 04/23/2022 0500   CO2 25 04/23/2022 0500   GLUCOSE 191 (H) 04/23/2022 0500   BUN 34 (H) 04/23/2022 0500   BUN 11 03/22/2021 0916   CREATININE 0.56 04/23/2022 0500   CREATININE 0.74 11/11/2015 1921   CALCIUM 8.6 (L) 04/23/2022 0500   PROT 6.6 04/21/2022 0506   PROT 8.1 03/22/2021 0916   ALBUMIN 1.9 (L) 04/21/2022 0506   ALBUMIN 4.4 03/22/2021 0916   AST 58 (H) 04/21/2022 0506   ALT 98 (H) 04/21/2022 0506   ALKPHOS 73 04/21/2022 0506   BILITOT 0.3 04/21/2022 0506   BILITOT 0.6 03/22/2021 0916   GFRNONAA >60 04/23/2022 0500   GFRNONAA >89 11/11/2015 1921   GFRAA 78 10/23/2020  1701   GFRAA >89 11/11/2015 1921   Lipase     Component Value Date/Time   LIPASE 26 02/02/2015 1201       Studies/Results: DG Abd Portable 1V  Result Date: 04/25/2022 CLINICAL DATA:  Feeding tube placement. EXAM: PORTABLE ABDOMEN - 1 VIEW COMPARISON:  04/07/2022. FINDINGS: Feeding tube is at the pylorus or within the duodenal bulb. IMPRESSION: The tube is at the pylorus or duodenal bulb. Electronically Signed   By: Lorin Picket M.D.   On: 04/25/2022 15:45    Anti-infectives: Anti-infectives (From admission, onward)    Start     Dose/Rate Route Frequency Ordered Stop   04/22/22 0600  ceFAZolin (ANCEF) IVPB 2g/100 mL premix        2 g 200 mL/hr over 30 Minutes Intravenous To [facility] 04/21/22 1210 04/23/22 0600   04/21/22 1630  piperacillin-tazobactam (ZOSYN) IVPB 3.375 g        3.375 g 12.5 mL/hr over 240 Minutes Intravenous Every 8 hours 04/21/22 1551     04/21/22 1615  cefTRIAXone (ROCEPHIN) 2 g in sodium chloride 0.9 % 100 mL IVPB  Status:  Discontinued        2 g 200 mL/hr over 30 Minutes Intravenous Every 24 hours 04/21/22 1523 04/21/22 1551   04/21/22 1615  metroNIDAZOLE (FLAGYL) tablet 500 mg  Status:  Discontinued  500 mg Per Tube 2 times daily 04/21/22 1523 04/21/22 1551   04/18/22 1115  cephALEXin (KEFLEX) capsule 500 mg  Status:  Discontinued        500 mg Per Tube Every 12 hours 04/18/22 1023 04/21/22 1523   04/16/22 2000  piperacillin-tazobactam (ZOSYN) IVPB 3.375 g  Status:  Discontinued        3.375 g 12.5 mL/hr over 240 Minutes Intravenous Every 8 hours 04/16/22 1901 04/18/22 1023   03/23/22 0830  piperacillin-tazobactam (ZOSYN) IVPB 3.375 g        3.375 g 12.5 mL/hr over 240 Minutes Intravenous Every 8 hours 03/23/22 0736 03/28/22 0121   03/21/22 1945  ceFAZolin (ANCEF) IVPB 2g/100 mL premix        2 g 200 mL/hr over 30 Minutes Intravenous Every 8 hours 03/21/22 1853 03/22/22 1144   03/21/22 0539  ceFAZolin (ANCEF) IVPB 2g/100 mL premix         2 g 200 mL/hr over 30 Minutes Intravenous 30 min pre-op 03/21/22 0539 03/21/22 0803        Assessment/Plan Sacral decubitus - Wound pictured above with mostly healthy granulation tissue. No significant tunneling or purulent drainage. Will add medi-honey to help debride small area of fibrinous exudate with dressing changes. Responding well to hydrotherapy. No indication for acute surgical intervention.  ID - currently zosyn FEN - regular diet, TF VTE - sq heparin, ASA '81mg'$  Foley - in place   I reviewed vascular surgery notes, last 24 h vitals and pain scores, last 48 h intake and output, last 24 h labs and trends, and last 24 h imaging results.    LOS: 37 days    Wellington Hampshire, [facility] 04/27/2022, 1:30 PM Please see Amion for pager number during day hours 7:00am-4:30pm

## 2022-04-27 NOTE — Progress Notes (Addendum)
Progress Note   Patient: Gabrielle [name] SKA:[id number] DOB: October 27, 1956 DOA: 03/21/2022     37 DOS: the patient was seen and examined on 04/27/2022   Brief hospital course: Reason for consultation medical management of leukocytosis, metabolic encephalopathy, hypernatremia and T2DM.   66 yo female with the past medical history of peripheral vascular disease, hypertension, dyslipidemia, T2DM, and rectal cancer who presented for aortobifemoral bypass due to critical limb ischemia on 03/21/22. Post operative course has been complicated by shock, in the setting of stress induced cardiomyopathy. Patient required invasive mechanical ventilation 03/23/22 and successfully liberated from mechanical ventilation on 04/02/22.  She developed ileus, requiring TPN.  Currently ileus has resolved and speech therapy has recommended dysphagia 1 diet.   Positive change in mentation, work up with head CT was negative for ischemic changes.   Patient with persistent leukocytosis since 05/09, more elevation for the last 3 days, up to 20,8.     Assessment and Plan:    Postoperative cardiogenic shock Acute systolic heart failure Patient required ICU admission and initiation of vasopressin and norepinephrine for management of shock. Cardiology consulted. Serial Transthoracic Echocardiogram significant for EF of 55-60% > 20-25% > 55-60%. Patient diuresed with improvement of clinical status. Shock resolved and vasopressors weaned off. Resolved.   Retroperitoneal hematoma Possible contributed to above problem. Large hematoma measuring 11  x 11.2 x 15.4 cm noted on CT abdomen/pelvis on 6/6. Discussion with vascular surgery with recommendations for antibiotic coverage -Zosyn IV with plan for long term suppressive therapy until hematoma resolves -Likely can obtain follow-up CT on 6/20 to see if hematoma is decreasing in size.  Also will need to follow serial hemoglobin to make sure not decreasing which could be symptom of  worsening or increasing size of hematoma   Abdominal incision wound Evaluated and managed by vascular surgery.   Acute respiratory failure with hypoxia Patient required ICU admission and intubation for respiratory distress. Patient successfully extubated. Weaned to room air. Resolved.   Critical limb ischemia PAD Hyperlipidemia Patient initially admitted for aortobifemoral bypass by vascular surgery. Currently stable. -Continue aspirin 81 mg daily and Crestor 40 mg daily   Elevated aPTT Unsure of etiology. Patient is on heparin 5000 units subcutaneously TID. aPTT has been trending normally. -Recheck aPTT periodically   Complicated UTI, resolved Associated fevers. Urine culture (6/3) significant for E. Coli bacteruria. Patient empirically treated with Zosyn which has transitioned to Keflex. Blood cultures (6/2) with no growth. Completed antibiotic course for treatment.   Urinary incontinence Urine is soaking sacral wound dressing. -Foley placement 2/03   Acute metabolic encephalopathy In setting of ICU admission/shock/UTI. Patient mental status appears to be improved.   Diabetes mellitus, type 2 Uncontrolled with hyperglycemia. Patient is on Januvia, metformin and Wilder Glade as an outpatient. Januvia and [place] held on admission. Started on insulin this admission. Issues with control secondary to tube feeds. Tradjenta started while inpatient. Blood sugar uncontrolled. Discontinued metformin and Tradjenta while inpatient. Better control of blood sugar achieved with current regimen at this time. -Continue Levemir 25 units and SSI q4 hours -Continue Novolog 4 units TID with meals during the day -Continue Novolog 4 units q4 hours overnight while on tube feeds   Acute diarrhea In setting of tube feedings. Started on imodium. Feces appears to be less liquid. Diet has been changed to regular with thin liquids during the day but will continue on tube feedings until appetite and intake  increase   Severe malnutrition Dysphagia Dietitian consulted. Patient with a Cortrak NG  tube on tube feeds. Received TPN while in the ICU, which is now discontinued. Now with plans for PEG tube placement which have been deferred until after the weekend per vascular surgery. Malnutrition complicated by dysphagia; speech therapy is on board. Patient continues to have poor oral intake with poor motivation for improving intake. Have petitioned family to be more proactive in patient's nutrition intake. -Dietitian recommendations (6/4): Change to nocturnal tube feeding via Cortrak:  Vital 1.5 at 70 ml/hr x 14 hours (980 ml total) Pro-Source TF 90 mL BID Provides 1630 kcals (85% of estimated needs), 110 g of protein (100% of minimum estimated needs), 749 mL of free water. Free water of 100 mL q 6 hours with TF provides 1148 mL of free water. Continue Juven BID per tube, each packet provides 80 calories, 8 grams of carbohydrate, 2.5  grams of protein (collagen), 7 grams of L-arginine and 7 grams of L-glutamine; supplement contains CaHMB, Vitamins C, E, B12 and Zinc to promote wound healing. Continue Magic cup TID with meals, each supplement provides 290 kcal and 9 grams of protein. Continue Ensure Plus High Protein po TID, each supplement provides 350 kcal and 20 grams of protein. Feeding assistance at meal times and with supplements. 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  Continue hydrotherapy I  was able to observe this wound since PT was at the bedside performing wound care hydrotherapy.  There is minimal fibrin tissue in the decubitus.  There is a large deep center in the decubitus that we will need to have significant secondary intention and granular feeling which may be difficult given patient's degree of disc mobility and requirement for bedrest.  Wound overall looks great  Severe deconditioning Once patient is medically stable she definitely require aggressive therapy before discharging back to the home environment Currently is not able to participate with aggressive therapy so not a candidate for CIR Continue to monitor in the event she improves with her ability to participate and therefore would be a candidate for CIR; of note continues to have some confusion likely as a sequelae of her severe illness and as this improves hopefully her ability to participate with therapy will improve as well              Subjective: Patient minimally conversant.  She is awake and alert although appears to have some difficulty processing when I speak to her.  She did occasionally smile.  Able to easily follow commands.  Physical Exam: Vitals:   04/27/22 0002 04/27/22 0319 04/27/22 0700 04/27/22 1138  BP: (!) 134/96 138/77 129/69 114/70  Pulse: 99 (!) 103 93 95  Resp: '19 20 20 20  '$ Temp: 98.3 F (36.8 C) 98.5 F (36.9 C) 97.9 F (36.6 C) 97.9 F (36.6 C)  TempSrc: Oral Oral Oral Oral  SpO2: 99% 100% 100% 95%  Weight:      Height:       General exam: Appears calm and comfortable Respiratory system: Clear to auscultation. Respiratory effort normal. Cardiovascular system: S1 & S2 heard, RRR. No murmurs, rubs. Gastrointestinal system: Abdomen is nondistended, soft and nontender. Normal bowel sounds heard. Central nervous  system: Alert and oriented. Musculoskeletal: No edema. No calf tenderness Psychiatry: Judgement and insight appear normal. Mood & affect appropriate.    Data  Reviewed:  There are no new results to review at this time.  Family Communication: Only  Disposition: Status is: Inpatient Remains inpatient appropriate because: Level of care remains high.  Patient continues to remain medically unstable in regards to ability to return to the home environment and is requiring aggressive wound care that includes hydrotherapy.  She currently requires a core track feeding tube for nocturnal tube feedings but has been started on oral diet and it is hopeful that as appetite and intake improves the core track tube can be removed.  If patient can improve with her ability to participate with physical therapy we are hopeful that she will qualify for CIR.  Planned Discharge Destination: Rehab at SNF versus CIR    Time spent: Greater than 30 minutes  Author: Erin Hearing, NP 04/27/2022 3:56 PM  The patient was seen and examined independently, labs records were reviewed independently.  Agree with above assessment and plan.   SIGNED: Deatra James, MD, FHM. [facility],  Pager (please use [email or web address] to page/text)  Please use Epic Secure Chat for non-urgent communication (7AM-7PM) If 7PM-7AM, please contact night-coverage [email or web address],  04/27/2022, 4:25 PM     For on call review [email or web address].

## 2022-04-27 NOTE — Progress Notes (Signed)
Occupational Therapy Treatment Patient Details Name: Gabrielle [name] MRN: [id number] DOB: 1956/09/05 Today's Date: 04/27/2022   History of present illness Pt is a 66 y.o. female admitted 03/21/22 for open aortobifemoral bypass and bilateral femoral endarterectomy, complicated by MI, HF, shock. ETT 5/10-5/20. Pt with waxing/waning mentation; head CT 5/26 negative for acute abnormality. Pt also with sacral wound and hydrotherapy following since 04/04/22. PMH includes rectal CA, DM2, HTN.   OT comments  Patient received in bed and apprehensive about getting out of bed with agreed with encouragement. Patient rolled in bed with mod/max assist to position lift pad and was transferred with maxisky. Patient stood in stedy x3 with mod assist of 2. Cushion applied to chair to allow for better pressure relieve with patient stating minimum change in comfort. Patient to continue to be followed by Acute OT.    Recommendations for follow up therapy are one component of a multi-disciplinary discharge planning process, led by the attending physician.  Recommendations may be updated based on patient status, additional functional criteria and insurance authorization.    Follow Up Recommendations  Skilled nursing-short term rehab (<3 hours/day)    Assistance Recommended at Discharge Frequent or constant Supervision/Assistance  Patient can return home with the following  Two people to help with walking and/or transfers;Two people to help with bathing/dressing/bathroom;Direct supervision/assist for medications management;Direct supervision/assist for financial management;Assist for transportation;Help with stairs or ramp for entrance   Equipment Recommendations  Other (comment) (TBD)    Recommendations for Other Services      Precautions / Restrictions Precautions Precautions: Fall;Other (comment) Precaution Comments: cortrak (PM feeds only), flexiseal (falls out easily), sacral wound; RUE restricted/no BP;  L  brachial BP painful try wrist Restrictions Weight Bearing Restrictions: No       Mobility Bed Mobility Overal bed mobility: Needs Assistance Bed Mobility: Rolling Rolling: Max assist, Mod assist         General bed mobility comments: rolling in bed to place lift pad    Transfers Overall transfer level: Needs assistance Equipment used: Ambulation equipment used Transfers: Sit to/from Stand Sit to Stand: Min assist, +2 physical assistance (with stedy)           General transfer comment: maxisky used to transfer from bed to recliner. Patient performed 3 stands from recliner to stedy with mod assist x2.  Patietn able to correct balance with verbal cues and mod assist Transfer via Lift Equipment: Maxisky, Stedy   Balance Overall balance assessment: Needs assistance Sitting-balance support: Bilateral upper extremity supported Sitting balance-Leahy Scale: Poor Sitting balance - Comments: leaning in recliner to relieve pressure Postural control: Left lateral lean, Posterior lean, Right lateral lean Standing balance support: Bilateral upper extremity supported Standing balance-Leahy Scale: Poor Standing balance comment: mod assist x2 to stand and for balance in stedy                           ADL either performed or assessed with clinical judgement   ADL Overall ADL's : Needs assistance/impaired                     Lower Body Dressing: Total assistance;Bed level Lower Body Dressing Details (indicate cue type and reason): instructed patient on assisting at bed level but declined to attempt               General ADL Comments: max to total assist for self care    Extremity/Trunk Assessment  Vision       Perception     Praxis      Cognition Arousal/Alertness: Awake/alert Behavior During Therapy: Flat affect Overall Cognitive Status: Impaired/Different from baseline Area of Impairment: Attention, Following commands,  Safety/judgement, Problem solving, Awareness, Orientation                 Orientation Level: Disoriented to, Time, Situation, Place Current Attention Level: Sustained   Following Commands: Follows one step commands inconsistently, Follows one step commands with increased time Safety/Judgement: Decreased awareness of safety, Decreased awareness of deficits Awareness: Intellectual Problem Solving: Slow processing, Decreased initiation, Difficulty sequencing, Requires verbal cues, Requires tactile cues General Comments: alert and apprehensive about getting out of bed due to pain at wound site        Exercises      Shoulder Instructions       General Comments      Pertinent Vitals/ Pain       Pain Assessment Pain Assessment: Faces Faces Pain Scale: Hurts even more Pain Location: sacral wound Pain Descriptors / Indicators: Restless, Grimacing, Guarding, Moaning Pain Intervention(s): Monitored during session, Repositioned, Limited activity within patient's tolerance  Home Living                                          Prior Functioning/Environment              Frequency           Progress Toward Goals  OT Goals(current goals can now be found in the care plan section)  Progress towards OT goals: Progressing toward goals  Acute Rehab OT Goals OT Goal Formulation: Patient unable to participate in goal setting Time For Goal Achievement: 05/02/22 Potential to Achieve Goals: Fair ADL Goals Pt Will Perform Grooming: with mod assist;sitting Pt Will Perform Upper Body Dressing: with max assist;sitting Pt Will Perform Lower Body Dressing: with max assist;sitting/lateral leans Pt Will Transfer to Toilet: with mod assist;with +2 assist;bedside commode Pt Will Perform Toileting - Clothing Manipulation and hygiene: with mod assist;with 2+ total assist;sit to/from stand Pt/caregiver will Perform Home Exercise Program: Both right and left upper  extremity;With minimal assist;With written HEP provided Additional ADL Goal #1: Pt will perform bed mobility at max A level to come EOB prior to engaging in ADL  Plan Discharge plan remains appropriate    Co-evaluation    PT/OT/SLP Co-Evaluation/Treatment: Yes Reason for Co-Treatment: Complexity of the patient's impairments (multi-system involvement);For patient/therapist safety;To address functional/ADL transfers   OT goals addressed during session: Strengthening/ROM      AM-PAC OT "6 Clicks" Daily Activity     Outcome Measure   Help from another person eating meals?: A Lot Help from another person taking care of personal grooming?: Total Help from another person toileting, which includes using toliet, bedpan, or urinal?: Total Help from another person bathing (including washing, rinsing, drying)?: Total Help from another person to put on and taking off regular upper body clothing?: Total Help from another person to put on and taking off regular lower body clothing?: Total 6 Click Score: 7    End of Session Equipment Utilized During Treatment: Gait belt;Other (comment) (maxisky and stedy)  OT Visit Diagnosis: Unsteadiness on feet (R26.81);Other abnormalities of gait and mobility (R26.89);Muscle weakness (generalized) (M62.81);Other symptoms and signs involving cognitive function   Activity Tolerance Patient limited by pain   Patient Left in chair;with call  bell/phone within reach;with chair alarm set;with family/visitor present   Nurse Communication Mobility status;Need for lift equipment        Time: 7127-8718 OT Time Calculation (min): 31 min  Charges: OT General Charges $OT Visit: 1 Visit OT Treatments $Therapeutic Activity: 8-22 mins  Lodema Hong, OTA Acute Rehabilitation Services  Pager [phone] Office [place] 04/27/2022, 2:55 PM

## 2022-04-27 NOTE — Progress Notes (Signed)
Physical Therapy Wound Treatment Patient Details  Name: Gabrielle [name] MRN: [id number] Date of Birth: [birth year]  Today's Date: 04/27/2022 Time: 4098-1191 Time Calculation (min): 52 min  Subjective  Subjective Assessment Subjective: Pt not verbal this session, just shaking head yes/no on occasion, very lethargic. Patient and Family Stated Goals: for wound to heal Date of Onset:  (unknown) Prior Treatments: dressing changes  Pain Score:  Pre-medicated, good pain tolerance today  Wound Assessment  Pressure Injury 03/30/22 Sacrum Posterior;Right;Left Unstageable - Full thickness tissue loss in which the base of the injury is covered by slough (yellow, tan, gray, green or brown) and/or eschar (tan, brown or black) in the wound bed. (Active)  Wound Image   04/27/22 1353  Dressing Type Foam - Lift dressing to assess site every shift;Barrier Film (skin prep);Gauze (Comment);Other (Comment);Alginate 04/27/22 1353  Dressing Clean, Dry, Intact 04/27/22 1353  Dressing Change Frequency Daily 04/27/22 1353  State of Healing Eschar 04/27/22 1353  Site / Wound Assessment Yellow;Pink;Pale 04/27/22 1353  % Wound base Red or Granulating 90% 04/27/22 1353  % Wound base Yellow/Fibrinous Exudate 10% 04/27/22 1353  % Wound base Black/Eschar 0% 04/27/22 1353  % Wound base Other/Granulation Tissue (Comment) 0% 04/27/22 1353  Peri-wound Assessment Erythema (non-blanchable);Pink;Purple 04/27/22 1353  Wound Length (cm) 9.2 cm 04/27/22 1353  Wound Width (cm) 12 cm 04/27/22 1353  Wound Depth (cm) 3.3 cm 04/27/22 1353  Wound Surface Area (cm^2) 110.4 cm^2 04/27/22 1353  Wound Volume (cm^3) 364.32 cm^3 04/27/22 1353  Tunneling (cm) 0 04/27/22 1353  Undermining (cm) 2.3 cm from 11:00-12:00, 2.6 cm from 1:00-3:00 04/27/22 1353  Margins Unattached edges (unapproximated) 04/27/22 1353  Drainage Amount Moderate 04/27/22 1353  Drainage Description Serosanguineous 04/27/22 1353  Treatment Debridement  (Selective);Irrigation;Packing (Dry gauze);Other (Comment) 04/27/22 1353   Hydrotherapy Pulsed lavage therapy - wound location: sacral Pulsed Lavage with Suction (psi): 12 psi Pulsed Lavage with Suction - Normal Saline Used: 1000 mL Pulsed Lavage Tip: Tip with splash shield Selective Debridement Selective Debridement - Location: sacral Selective Debridement - Tools Used: Forceps, Scalpel, Scissors Selective Debridement - Tissue Removed: Yellow necrotic tissue    Wound Assessment and Plan  Wound Therapy - Assess/Plan/Recommendations Wound Therapy - Clinical Statement: Pt's wound is continuing to make good progress with the reduction of necrotic tissue. Pt met her goal today to reduce necrotic tissue to 10%, thus updated goal to 5%. As there was limited amount of necrotic tissue present, minimal amounts were debrided today. Coordinated with Surgery PA Meuth in regards to new order to return back to using medihoney for the wound, which hydrotherapy will begin next session. Pt would continue to benefit from hydrotherapy to further remove necrotic tissue and improve wound healing, and if the wound continues to make good progress by next session may reduce frequency or sign off. Wound Therapy - Functional Problem List: functional debility after prolonged time in bed. Factors Delaying/Impairing Wound Healing: Immobility, Multiple medical problems, Vascular compromise, Diabetes Mellitus Hydrotherapy Plan: Dressing change, Debridement, Pulsatile lavage with suction, Patient/family education Wound Therapy - Frequency: 3X / week (M, W, F) Wound Therapy - Current Recommendations: PT Wound Therapy - Follow Up Recommendations: dressing changes by RN, dressing changes by family/patient  Wound Therapy Goals- Improve the function of patient's integumentary system by progressing the wound(s) through the phases of wound healing (inflammation - proliferation - remodeling) by: Wound Therapy Goals - Improve the  function of patient's integumentary system by progressing the wound(s) through the phases of wound healing by: Decrease Necrotic  Tissue to: 5% Decrease Necrotic Tissue - Progress: Updated due to goal met Increase Granulation Tissue to: 95% Increase Granulation Tissue - Progress: Updated due to goal met Improve Drainage Characteristics: Min, Serous Improve Drainage Characteristics - Progress: Progressing toward goal Goals/treatment plan/discharge plan were made with and agreed upon by patient/family: Yes Time For Goal Achievement: 7 days Wound Therapy - Potential for Goals: Good  Goals will be updated until maximal potential achieved or discharge criteria met.  Discharge criteria: when goals achieved, discharge from hospital, MD decision/surgical intervention, no progress towards goals, refusal/missing three consecutive treatments without notification or medical reason.  GP     Charges PT Wound Care Charges $Wound Debridement up to 20 cm: < or equal to 20 cm $PT Hydrotherapy Dressing: 2 dressings $PT Hydrotherapy Visit: 1 Visit   Moishe Spice, PT, DPT Acute Rehabilitation Services  Office: [phone]       Orvan Falconer 04/27/2022, 2:01 PM

## 2022-04-27 NOTE — Progress Notes (Signed)
Physical Therapy Treatment Patient Details Name: Gabrielle [name] MRN: [id number] DOB: Mar 31, 1956 Today's Date: 04/27/2022   History of Present Illness Pt is a 66 y.o. female admitted 03/21/22 for open aortobifemoral bypass and bilateral femoral endarterectomy, complicated by MI, HF, shock. ETT 5/10-5/20. Pt with waxing/waning mentation; head CT 5/26 negative for acute abnormality. Pt also with sacral wound and hydrotherapy following since 04/04/22. PMH includes rectal CA, DM2, HTN.    PT Comments    Pt treated in conjunction with OT to maximize pt safety and quality of session. Pt is making slow, gradual progress as she was able to stand x3 reps at a modAx2 level for increased periods of time (~20-30 sec) in the stedy today. However, she remains limited by her buttocks pain from her sacral wound and flexiseal. Attempted to re-adjusted x2 geo-matts at a diagonal to each other to allow space for her sacrum to float and then when that did not help reduce the pain we tried adding the posey mittens under her bil ischiums to further offload the sacrum, but still reporting no improvement. Messaged [facility] to see if therapy could borrow a seat cushion to reduce sacral pressure/pain. Provided HEP handout and therabands for pt to continue to make progress in strength for functional mobility. Will need to review handout with husband so he can assist her with the exercises daily, but he was not present at end of session. As pt is now making steady progress and has had a long, complicated hospital stay with a drastic change in functional status from PLOF, updated d/c recs to [facility]. Will continue to follow acutely.    *Of note, pt stayed in the recliner x2 hours after therapy session       Recommendations for follow up therapy are one component of a multi-disciplinary discharge planning process, led by the attending physician.  Recommendations may be updated based on patient status, additional functional criteria and  insurance authorization.  Follow Up Recommendations  Acute inpatient rehab (3hours/day)     Assistance Recommended at Discharge Frequent or constant Supervision/Assistance  Patient can return home with the following Two people to help with walking and/or transfers;Two people to help with bathing/dressing/bathroom;Assistance with cooking/housework;Assist for transportation;Direct supervision/assist for financial management;Help with stairs or ramp for entrance;Direct supervision/assist for medications management   Equipment Recommendations  Wheelchair (measurements PT);Wheelchair cushion (measurements PT);Hospital bed;Other (comment) (hoyer lift; petite size for WC as pt is 31f 9")    Recommendations for Other Services Rehab consult     Precautions / Restrictions Precautions Precautions: Fall;Other (comment) Precaution Comments: cortrak (PM feeds only), flexiseal (falls out easily), sacral wound; RUE restricted/no BP;  L brachial BP painful try wrist Restrictions Weight Bearing Restrictions: No     Mobility  Bed Mobility Overal bed mobility: Needs Assistance Bed Mobility: Rolling Rolling: Mod assist         General bed mobility comments: Cues to reach for either bed rail and to flex knees, modA to roll each direction to place lift pad    Transfers Overall transfer level: Needs assistance Equipment used: Ambulation equipment used Transfers: Sit to/from Stand, Bed to chair/wheelchair/BSC Sit to Stand: +2 physical assistance, Mod assist, +2 safety/equipment (with stedy)           General transfer comment: maxisky used to transfer from bed to recliner. Patient performed 3 stands from recliner to stedy with mod assist x2.  Patient able to correct balance with verbal cues and mod assist, using bed pad to extend hips and  lift pt as needed Transfer via Lift Equipment: Camelia Eng  Ambulation/Gait               General Gait Details: Economist Rankin (Stroke Patients Only)       Balance Overall balance assessment: Needs assistance Sitting-balance support: Bilateral upper extremity supported Sitting balance-Leahy Scale: Poor Sitting balance - Comments: leaning in recliner to relieve pressure Postural control: Left lateral lean, Posterior lean, Right lateral lean Standing balance support: Bilateral upper extremity supported Standing balance-Leahy Scale: Poor Standing balance comment: mod assist x2 to stand and for balance in stedy                            Cognition Arousal/Alertness: Awake/alert Behavior During Therapy: Flat affect Overall Cognitive Status: Impaired/Different from baseline Area of Impairment: Attention, Following commands, Safety/judgement, Problem solving, Awareness, Orientation                 Orientation Level: Disoriented to, Time, Situation, Place Current Attention Level: Sustained   Following Commands: Follows one step commands inconsistently, Follows one step commands with increased time Safety/Judgement: Decreased awareness of safety, Decreased awareness of deficits Awareness: Intellectual Problem Solving: Slow processing, Decreased initiation, Difficulty sequencing, Requires verbal cues, Requires tactile cues General Comments: alert and apprehensive about getting out of bed due to pain at wound site; needs reminders to not pull on various objects or lean very hard laterally to maintain safety        Exercises      General Comments General comments (skin integrity, edema, etc.): MedBridge HEP Access Code: [email or web address], provided pt with red and yellow therabands; communicated with [facility] to see if could borrow a seat cushion to reduce sacral pressure/pain; coordinated with [facility] to see if pt would qualify for AIR now      Pertinent Vitals/Pain Pain Assessment Pain Assessment: Faces Faces Pain Scale: Hurts even more Pain Location: sacral  wound Pain Descriptors / Indicators: Restless, Grimacing, Guarding, Moaning Pain Intervention(s): Limited activity within patient's tolerance, Monitored during session, Repositioned    Home Living                          Prior Function            PT Goals (current goals can now be found in the care plan section) Acute Rehab PT Goals Patient Stated Goal: to reduce the pain PT Goal Formulation: With patient/family Time For Goal Achievement: 05/02/22 Potential to Achieve Goals: Fair Progress towards PT goals: Progressing toward goals    Frequency    Min 3X/week      PT Plan Frequency needs to be updated;Discharge plan needs to be updated    Co-evaluation PT/OT/SLP Co-Evaluation/Treatment: Yes Reason for Co-Treatment: Complexity of the patient's impairments (multi-system involvement);For patient/therapist safety;To address functional/ADL transfers PT goals addressed during session: Mobility/safety with mobility;Balance;Strengthening/ROM OT goals addressed during session: Strengthening/ROM      AM-PAC PT "6 Clicks" Mobility   Outcome Measure  Help needed turning from your back to your side while in a flat bed without using bedrails?: A Lot Help needed moving from lying on your back to sitting on the side of a flat bed without using bedrails?: Total Help needed moving to and from a bed to a chair (including a wheelchair)?: Total Help needed standing  up from a chair using your arms (e.g., wheelchair or bedside chair)?: Total Help needed to walk in hospital room?: Total Help needed climbing 3-5 steps with a railing? : Total 6 Click Score: 7    End of Session         PT Visit Diagnosis: Muscle weakness (generalized) (M62.81);Other abnormalities of gait and mobility (R26.89);Pain;Unsteadiness on feet (R26.81);Difficulty in walking, not elsewhere classified (R26.2) Pain - part of body:  (sacrum)     Time: 7282-0601 PT Time Calculation (min) (ACUTE ONLY): 38  min  Charges:  $Therapeutic Activity: 23-37 mins                     Moishe Spice, PT, DPT Acute Rehabilitation Services  Office: [place] 04/27/2022, 5:57 PM

## 2022-04-27 NOTE — Progress Notes (Signed)
Mobility Specialist: Progress Note   04/27/22 1600  Mobility  Activity Transferred from chair to bed  Level of Assistance +2 (takes two people)  Civil engineer, contracting / Overhead Lift  Activity Response Tolerated fair  $Mobility charge 1 Mobility   Pt assisted back to bed per request. C/o discomfort from flexiseal during transfer, otherwise no c/o. Pt back in bed with call bell at her side and NT present in the room.   [facility] Pilger Mobility Specialist Mobility Specialist [address]: [phone]

## 2022-04-27 NOTE — Progress Notes (Signed)
Nutrition Brief Note  Nocturnal TF timing adjusted  Nocturnal tube feeding via cortrak tube: Run from 1800-0600  Glucerna 1.5 at 83 ml/h (996 ml per day) Prosource TF 45 ml TID  Provides 1614 kcal, 115 gm protein, 757 ml free water daily  Tamryn Popko P., RD, LDN, CNSC See AMiON for contact information

## 2022-04-27 NOTE — Plan of Care (Signed)
  Problem: Education: Goal: Knowledge of General Education information will improve Description: Including pain rating scale, medication(s)/side effects and non-pharmacologic comfort measures Outcome: Progressing   Problem: Health Behavior/Discharge Planning: Goal: Ability to manage health-related needs will improve Outcome: Progressing   Problem: Clinical Measurements: Goal: Ability to maintain clinical measurements within normal limits will improve Outcome: Progressing Goal: Will remain free from infection Outcome: Progressing Goal: Diagnostic test results will improve Outcome: Progressing Goal: Respiratory complications will improve Outcome: Progressing Goal: Cardiovascular complication will be avoided Outcome: Progressing   Problem: Activity: Goal: Risk for activity intolerance will decrease Outcome: Progressing   Problem: Nutrition: Goal: Adequate nutrition will be maintained Outcome: Progressing   Problem: Coping: Goal: Level of anxiety will decrease Outcome: Progressing   Problem: Elimination: Goal: Will not experience complications related to bowel motility Outcome: Progressing   Problem: Pain Managment: Goal: General experience of comfort will improve Outcome: Progressing   Problem: Safety: Goal: Ability to remain free from injury will improve Outcome: Progressing   Problem: Skin Integrity: Goal: Risk for impaired skin integrity will decrease Outcome: Progressing   Problem: Education: Goal: Ability to describe self-care measures that may prevent or decrease complications (Diabetes Survival Skills Education) will improve Outcome: Progressing Goal: Individualized Educational Video(s) Outcome: Progressing   Problem: Coping: Goal: Ability to adjust to condition or change in health will improve Outcome: Progressing   Problem: Fluid Volume: Goal: Ability to maintain a balanced intake and output will improve Outcome: Progressing   Problem: Health  Behavior/Discharge Planning: Goal: Ability to identify and utilize available resources and services will improve Outcome: Progressing Goal: Ability to manage health-related needs will improve Outcome: Progressing   Problem: Metabolic: Goal: Ability to maintain appropriate glucose levels will improve Outcome: Progressing   Problem: Nutritional: Goal: Maintenance of adequate nutrition will improve Outcome: Progressing Goal: Progress toward achieving an optimal weight will improve Outcome: Progressing   Problem: Skin Integrity: Goal: Risk for impaired skin integrity will decrease Outcome: Progressing   Problem: Tissue Perfusion: Goal: Adequacy of tissue perfusion will improve Outcome: Progressing

## 2022-04-27 NOTE — Progress Notes (Signed)
[facility]  Subjective  -mental status appears back to baseline.  Asking appropriate questions   Objective 129/69 93 97.9 F (36.6 C) (Oral) 20 100%  Intake/Output Summary (Last 24 hours) at 04/27/2022 0835 Last data filed at 04/27/2022 0526 Gross per 24 hour  Intake 3603.75 ml  Output 1600 ml  Net 2003.75 ml     Abdomin soft, distal incision beefy red open wound base.   Feet warm well perfused, minimal motor with mild edema Speech clear Lungs non labored breathing  Assessment/Planning: 66 y.o. female is s/p:  Aortobifemoral bypass graft  35 Days Post-Op  - Distal abdominal incision -dry dressing twice daily - Encourage mobility of LE while in the bed - I spoke to physical therapy yesterday, and had a long discussion with Stanton Kidney and this morning regarding her ambulatory status.  She is aware she needs aggressive physical therapy in an effort to recover as she has been nonambulatory now for roughly 1 month.  Physical therapy will also offer exercises she can do while in bed. - Regarding feeding, I spoke to speech-language pathology, I would like to stop her overnight tube feeds earlier in the morning and effort to promote p.o. breakfast, no dysmotility issues preventing p.o. intake. --Again I discussed this with Ria and want to promote p.o. to prevent PEG - We will reach out to general surgery today for evaluation of sacral wound. - Pt with severe deconditioning but overall improving slowly.  - Appreciate support from multidisciplinary team.   - Continue TF for nutrition - DVT prophylaxis:  sq heparin   Broadus John 04/27/2022 8:35 AM --     Laboratory Lab Results: Recent Labs    04/25/22 0545  WBC 11.9*  HGB 9.3*  HCT 30.3*  PLT 322    BMET No results for input(s): "NA", "K", "CL", "CO2", "GLUCOSE", "BUN", "CREATININE", "CALCIUM" in the last 72 hours.  COAG Lab Results  Component Value Date   INR 1.1 03/27/2022    INR 1.2 03/26/2022   INR 1.1 03/25/2022   No results found for: "PTT"

## 2022-04-27 NOTE — Progress Notes (Addendum)
Speech Language Pathology Treatment: Dysphagia;Cognitive-Linquistic  Patient Details Name: Gabrielle [name] MRN: [id number] DOB: October 10, 1956 Today's Date: 04/27/2022 Time: 0919-1000 SLP Time Calculation (min) (ACUTE ONLY): 41 min  Assessment / Plan / Recommendation Clinical Impression  Pt progressed well in therapy today. With much encouragement she allowed upper denture plate to be donned (using powder Fixodent) and tolerated well. Agreeable to trial of upgraded texture of regular- took her time masticating but adequate timing, mandibular movement and oral clearance or graham cracker. Thin liquids via straw without signs aspiration throughout session. She was in pain today complaining of leg pain and stomach fullness/pain with supervision with husband at bedside who was helpful in encouraging her as well as sister-in-law who works in hospital. Therapist upgraded texture to regular, continue thin and encourage po's. Discussed with surgeon Virl Cagey) possibility of starting nocturnal feeds earlier and stopping earlier so she is hungrier for food during the day if allowed per RD.  Cognitively she was responsive to questions, needing repetition at times but mainly mostly appearing behavioral. Attention has greatly improved and she was able to attend with min occasional cues due to internal distraction of pain. Her responses were appropriate and she was able to make choices from menu with husband offering various foods. Needed cues to find the appropriate device to call RN (picked up the bed remote control.)   HPI HPI: Patient is a 66 y/o female who presents on 5/8 for open aortobifemoral bypass and Bilateral femoral endarterectomy complicated by MI, heart failure and shock, intubated 5/10-5/20. PMH includes rectal cancer, DM2, HTN.      SLP Plan  Continue with current plan of care      Recommendations for follow up therapy are one component of a multi-disciplinary discharge planning process, led by the  attending physician.  Recommendations may be updated based on patient status, additional functional criteria and insurance authorization.    Recommendations  Diet recommendations: Regular;Thin liquid Liquids provided via: Straw;Cup Medication Administration: Whole meds with puree Supervision: Staff to assist with self feeding;Full supervision/cueing for compensatory strategies Compensations: Slow rate;Small sips/bites;Minimize environmental distractions Postural Changes and/or Swallow Maneuvers: Seated upright 90 degrees;Upright 30-60 min after meal                Oral Care Recommendations: Oral care BID Follow Up Recommendations: Skilled nursing-short term rehab (<3 hours/day) Assistance recommended at discharge: Frequent or constant Supervision/Assistance SLP Visit Diagnosis: Dysphagia, unspecified (R13.10);Cognitive communication deficit (R41.841) Plan: Continue with current plan of care           Houston Siren  04/27/2022, 10:14 AM

## 2022-04-28 LAB — GLUCOSE, CAPILLARY
Glucose-Capillary: 198 mg/dL — ABNORMAL HIGH (ref 70–99)
Glucose-Capillary: 197 mg/dL — ABNORMAL HIGH (ref 70–99)
Glucose-Capillary: 103 mg/dL — ABNORMAL HIGH (ref 70–99)
Glucose-Capillary: 108 mg/dL — ABNORMAL HIGH (ref 70–99)
Glucose-Capillary: 109 mg/dL — ABNORMAL HIGH (ref 70–99)

## 2022-04-28 LAB — COMPREHENSIVE METABOLIC PANEL
ALT: 40 U/L (ref 0–44)
AST: 26 U/L (ref 15–41)
Albumin: 2.2 g/dL — ABNORMAL LOW (ref 3.5–5.0)
Alkaline Phosphatase: 62 U/L (ref 38–126)
Anion gap: 8 (ref 5–15)
BUN: 21 mg/dL (ref 8–23)
CO2: 23 mmol/L (ref 22–32)
Calcium: 8.3 mg/dL — ABNORMAL LOW (ref 8.9–10.3)
Chloride: 106 mmol/L (ref 98–111)
Creatinine, Ser: 0.61 mg/dL (ref 0.44–1.00)
GFR, Estimated: >60 mL/min (ref >60)
Glucose, Bld: 249 mg/dL — ABNORMAL HIGH (ref 70–99)
Potassium: 3.4 mmol/L — ABNORMAL LOW (ref 3.5–5.1)
Sodium: 137 mmol/L (ref 135–145)
Total Bilirubin: 0.5 mg/dL (ref 0.3–1.2)
Total Protein: 6.9 g/dL (ref 6.5–8.1)

## 2022-04-28 LAB — CBC
HCT: 29.6 % — ABNORMAL LOW (ref 36.0–46.0)
Hemoglobin: 9 g/dL — ABNORMAL LOW (ref 12.0–15.0)
MCH: 28 pg (ref 26.0–34.0)
MCHC: 30.4 g/dL (ref 30.0–36.0)
MCV: 92.2 fL (ref 80.0–100.0)
Platelets: 328 10*3/uL (ref 150–400)
RBC: 3.21 MIL/uL — ABNORMAL LOW (ref 3.87–5.11)
RDW: 16.9 % — ABNORMAL HIGH (ref 11.5–15.5)
WBC: 11.8 10*3/uL — ABNORMAL HIGH (ref 4.0–10.5)
nRBC: 0 % (ref 0.0–0.2)

## 2022-04-28 LAB — APTT: aPTT: 32 seconds (ref 24–36)

## 2022-04-28 NOTE — Progress Notes (Signed)
Nutrition Follow-up  DOCUMENTATION CODES:   Severe malnutrition in context of acute illness/injury  INTERVENTION:  Continue nocturnal tube feeding via cortrak tube: Run from 1800-0600   Glucerna 1.5 at 83 ml/h (996 ml per day) Prosource TF 45 ml TID   Provides 1614 kcal, 115 gm protein, 757 ml free water daily  Continue Juven BID per tube, each packet provides 80 calories, 8 grams of carbohydrate, 2.5  grams of protein (collagen), 7 grams of L-arginine and 7 grams of L-glutamine; supplement contains CaHMB, Vitamins C, E, B12 and Zinc to promote wound healing.   Continue Magic cup TID with meals, each supplement provides 290 kcal and 9 grams of protein.   Continue Ensure Plus High Protein po TID, each supplement provides 350 kcal and 20 grams of protein.   Feeding assistance at meal times and with supplements.  Recommend long-term feeding tube access for nutrition given ongoing inadequate PO intake  NUTRITION DIAGNOSIS:   Severe Malnutrition related to acute illness as evidenced by mild fat depletion, moderate muscle depletion, energy intake < or equal to 50% for > or equal to 5 days.  Ongoing  GOAL:   Patient will meet greater than or equal to 90% of their needs  Addressing needs via meals and enteral nutrition  MONITOR:   Vent status, Weight trends, Labs, TF tolerance  REASON FOR ASSESSMENT:   Consult, Ventilator Enteral/tube feeding initiation and management  ASSESSMENT:   66 yo female admitted for aortobifemoral bypass due to right-sided Rutherford 4 critical limb ischemia, left-sided Rutherford 3 critical limb ischemia.  Pt developed postop shock and respiratory failure requiring intubation.  PMH includes DM, HTN, HLD, severe PAD  Pt in chair during visit. Observed breakfast tray on side table. Pt had not eaten anything from plate and was not up for eating at time of visit. She states that she only had apple juice this morning. Encouraged pt to continue to try to  eat some bites of her meals and drink Ensure.   Meal completions: 06/14: 0%-breakfast, 10%-lunch, 5%-dinner 06/15: 0%-breakfast  SLP advanced pt diet to regular with thin liquids yesterday and her tube feeding was adjusted from 14 hours to 12 hours, stopping at 0600 to encourage daytime hunger and hopeful improvement in oral intake. Spoke with RN who states that tube feedings were stopped at 0400 this morning as it was thought she was to be NPO today. She reports pt did not eat breakfast this morning and had only had a few sips of Ensure. Continues to encourage oral intake.   Given pt continues with poor oral intake despite adjustment in tube feeding regimen, would recommend long-term nutrition access to meet her nutrition needs. If pt receives a PEG, could always consider transitioning to bolus feeding regimen to allow pt to eat and then provide supplemental nutrition to meet ongoing increased nutritional needs. Reached out to MD and Vascular Surgery with concerns and  recommendation.   Edema: non-pitting BUE  Medications: SSI 0-15 units Q4H, SSI 4 units TID, levemir 25 units BID, medihoney, protonix, zosyn  Labs: potassium 3.4, CBG's 103-198 x24 hours  UOP: 1100 ml x24 hours  I/O's: +7366m since admission   Diet Order:   Diet Order             Diet NPO time specified  Diet effective midnight           Diet regular Room service appropriate? No; Fluid consistency: Thin  Diet effective now  EDUCATION NEEDS:   Not appropriate for education at this time  Skin:  Skin Assessment: Skin Integrity Issues: Skin Integrity Issues:: Unstageable DTI: perineum Unstageable: sacrococcygeal: seen by WOC  Last BM:  6/14 (type 7 via FMS- 640m)  Height:   Ht Readings from Last 1 Encounters:  03/24/22 '4\' 9"'$  (1.448 m)    Weight:   Wt Readings from Last 1 Encounters:  04/26/22 72.5 kg    BMI:  Body mass index is 34.59 kg/m.  Estimated Nutritional Needs:    Kcal:  1900-2100 kcals  Protein:  110-130 g  Fluid:  >/= 1.9 L  AClayborne Dana RDN, LDN Clinical Nutrition

## 2022-04-28 NOTE — Progress Notes (Signed)
Progress Note   Patient: Gabrielle [name] MOQ:[id number] DOB: 03-15-1956 DOA: 03/21/2022     38 DOS: the patient was seen and examined on 04/28/2022   Brief hospital course: Reason for consultation medical management of leukocytosis, metabolic encephalopathy, hypernatremia and T2DM.   66 yo female with the past medical history of peripheral vascular disease, hypertension, dyslipidemia, T2DM, and rectal cancer who presented for aortobifemoral bypass due to critical limb ischemia on 03/21/22. Post operative course has been complicated by shock, in the setting of stress induced cardiomyopathy. Patient required invasive mechanical ventilation 03/23/22 and successfully liberated from mechanical ventilation on 04/02/22.  She developed ileus, requiring TPN.  Currently ileus has resolved and speech therapy has recommended dysphagia 1 diet.   Positive change in mentation, work up with head CT was negative for ischemic changes.   Patient with persistent leukocytosis since 05/09, more elevation for the last 3 days, up to 20,8.     Assessment and Plan:  Postoperative cardiogenic shock Acute systolic heart failure Patient required ICU admission and initiation of vasopressin and norepinephrine for management of shock. Cardiology consulted. Serial Transthoracic Echocardiogram significant for EF of 55-60% > 20-25% > 55-60%. Patient diuresed with improvement of clinical status. Shock resolved and vasopressors weaned off. Resolved.   Retroperitoneal hematoma Possible contributed to above problem. Large hematoma measuring 11  x 11.2 x 15.4 cm noted on CT abdomen/pelvis on 6/6. Discussion with vascular surgery with recommendations for antibiotic coverage -Zosyn IV with plan for long term suppressive therapy until hematoma resolves -Likely can obtain follow-up CT on 6/20 to see if hematoma is decreasing in size.  Also will need to follow serial hemoglobin to make sure not decreasing which could be symptom of  worsening or increasing size of hematoma   Abdominal incision wound Evaluated and managed by vascular surgery.   Acute respiratory failure with hypoxia Patient required ICU admission and intubation for respiratory distress. Patient successfully extubated. Weaned to room air. Resolved.   Critical limb ischemia PAD Hyperlipidemia Patient initially admitted for aortobifemoral bypass by vascular surgery. Currently stable. -Continue aspirin 81 mg daily and Crestor 40 mg daily   Elevated aPTT Unsure of etiology. Patient is on heparin 5000 units subcutaneously TID. aPTT has been trending normally. -Recheck aPTT periodically   Complicated UTI, resolved Associated fevers. Urine culture (6/3) significant for E. Coli bacteruria. Patient empirically treated with Zosyn which has transitioned to Keflex. Blood cultures (6/2) with no growth. Completed antibiotic course for treatment.   Urinary incontinence Urine is soaking sacral wound dressing. -Foley placement 3/54   Acute metabolic encephalopathy In setting of ICU admission/shock/UTI. Patient mental status appears to be improved.   Diabetes mellitus, type 2 Uncontrolled with hyperglycemia. Patient is on Januvia, metformin and Wilder Glade as an outpatient. Januvia and [place] held on admission. Started on insulin this admission. Issues with control secondary to tube feeds. Tradjenta started while inpatient. Blood sugar uncontrolled. Discontinued metformin and Tradjenta while inpatient. Better control of blood sugar achieved with current regimen at this time. -Continue Levemir 25 units and SSI q4 hours -Continue Novolog 4 units TID with meals during the day -Continue Novolog 4 units q4 hours overnight while on tube feeds   Acute diarrhea In setting of tube feedings. Started on imodium. Feces appears to be less liquid. Diet has been changed to regular with thin liquids during the day but will continue on tube feedings until appetite and intake  increase   Severe malnutrition Dysphagia Dietitian consulted. Patient with a Cortrak NG tube on  tube feeds. Received TPN while in the ICU, which is now discontinued. Now with plans for PEG tube placement which have been deferred until after the weekend per vascular surgery. Malnutrition complicated by dysphagia; speech therapy is on board. Patient continues to have poor oral intake with poor motivation for improving intake. Have petitioned family to be more proactive in patient's nutrition intake. -Dietitian recommendations (6/4): Change to nocturnal tube feeding via Cortrak:  Vital 1.5 at 70 ml/hr x 14 hours (980 ml total) Pro-Source TF 90 mL BID Provides 1630 kcals (85% of estimated needs), 110 g of protein (100% of minimum estimated needs), 749 mL of free water. Free water of 100 mL q 6 hours with TF provides 1148 mL of free water. Continue Juven BID per tube, each packet provides 80 calories, 8 grams of carbohydrate, 2.5  grams of protein (collagen), 7 grams of L-arginine and 7 grams of L-glutamine; supplement contains CaHMB, Vitamins C, E, B12 and Zinc to promote wound healing. Continue Magic cup TID with meals, each supplement provides 290 kcal and 9 grams of protein. Continue Ensure Plus High Protein po TID, each supplement provides 350 kcal and 20 grams of protein. Feeding assistance at meal times and with supplements. 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  Continue hydrotherapy I  was able to observe this wound since PT was at the bedside performing wound care hydrotherapy.  There is minimal fibrin tissue in the decubitus.  There is a large deep center in the decubitus that we will need to have significant secondary intention and granular feeling which may be difficult given patient's degree of disc mobility and requirement for bedrest.  Wound overall looks great  Severe deconditioning Once patient is medically stable she definitely require aggressive therapy before discharging back to the home environment Currently is not able to participate with aggressive therapy so not a candidate for CIR. Continue to monitor in the event she improves with her ability to participate and therefore would be a candidate for CIR; of note continues to have some confusion likely as a sequelae of her severe illness and as this improves hopefully her ability to participate with therapy will improve as well    Subjective:  Alert and oriented x3; afebrile, no chest pain, no nausea, no vomiting.  Decreased appetite, chronically ill, weak and deconditioned.   Physical Exam: Vitals:   04/27/22 2339 04/28/22 0414 04/28/22 0813 04/28/22 1234  BP: 140/71 (!) 131/59 136/83 121/68  Pulse: (!) 104 (!) 109 (!) 104 93  Resp: '20 18 20 15  '$ Temp: 97.7 F (36.5 C) 97.9 F (36.6 C) 98.8 F (37.1 C) 98.5 F (36.9 C)  TempSrc: Oral Oral Oral Oral  SpO2: 100% 100% 100% 100%  Weight:      Height:       General exam: Alert, awake, oriented x 3; in no acute distress; good oxygen saturation on room air.  Denies chest pain, nausea or vomiting.  Patient is chronically ill, frail and generally weak.  Respiratory system: Normal respiratory effort; no using accessory muscles. Cardiovascular system:sinus tachycardia, no rubs, no gallops. Gastrointestinal system: Abdomen is nondistended, soft and nontender.  No organomegaly or masses felt. Normal bowel sounds heard. Central nervous system: Alert and oriented.  Generally weak, following commands appropriately. Extremities: No Cyanosis, no clubbing. Skin: No petechiae; unstageable sacral ulcer, actively receiving hydrotherapy.  No purulent drainage. Psychiatry: Judgement and insight appear normal. Mood & affect appropriate.   Data Reviewed: Comprehensive metabolic panel demonstrating sodium 137, potassium 3.4, chloride 106, blood sugar in the 1 90-2 50; BUN 21 and creatinine 0.61.  Normal LFTs.  Albumin 2.2 CBC: WBCs 11.8, hemoglobin 9.0, platelet count 328 K.  Family Communication: Husband at bedside.  Disposition: Status is: Inpatient  Remains inpatient appropriate because: Level of care remains high.  Patient continues to remain medically unstable in regards to ability to return to the home environment and is requiring aggressive wound care that includes hydrotherapy.  She currently requires a core track feeding tube for nocturnal tube feedings but has been started on oral diet and it is hopeful that as appetite and intake improves the core track tube can be removed.  If patient can improve with her ability to participate with physical therapy we are hopeful that she will qualify for CIR; otherwise patient will need skilled nursing facility for rehabilitation and care.  Nutritional service recommending PEG tube placement.   Planned Discharge Destination: Rehab at SNF versus CIR  Time spent: Greater than 30 minutes  Author: Barton Dubois, MD  04/28/2022 4:19 PM    For on call review [email or web address].

## 2022-04-28 NOTE — Progress Notes (Signed)
Physical Therapy Treatment Patient Details Name: Gabrielle [name] MRN: [id number] DOB: 23-Nov-1955 Today's Date: 04/28/2022   History of Present Illness Pt is a 66 y.o. female admitted 03/21/22 for open aortobifemoral bypass and bilateral femoral endarterectomy, complicated by MI, HF, shock. ETT 5/10-5/20. Pt with waxing/waning mentation; head CT 5/26 negative for acute abnormality. Pt also with sacral wound and hydrotherapy following since 04/04/22. PMH includes rectal CA, DM2, HTN.    PT Comments    Pt received sidelying on left and agreeable to session with focus on continued OOB mobility. Pt transferred via maximove to recliner with mod a to roll R/L for pad placement. Once seated in recliner pt more alert and able to make some conversation; pt able to come to standing with stedy for 3 trials and able to maintain standing ~30 seconds each trial with mod assist +2 to elevate hips and bring hips forward. Pt with c/o leg weakness during standing and sitting abruptly x1 instance. Pt with fair tolerance for LE therex for increased ROM and strength. Pt positioned to comfort in chair offloading R buttocks with pillow and leaning toward left with chair against bed for safety and support. Current plan remains appropriate to address deficits and maximize functional independence and decrease caregiver burden. Pt continues to benefit from skilled PT services to progress toward functional mobility goals.     Recommendations for follow up therapy are one component of a multi-disciplinary discharge planning process, led by the attending physician.  Recommendations may be updated based on patient status, additional functional criteria and insurance authorization.  Follow Up Recommendations  Acute inpatient rehab (3hours/day)     Assistance Recommended at Discharge Frequent or constant Supervision/Assistance  Patient can return home with the following Two people to help with walking and/or transfers;Two people to  help with bathing/dressing/bathroom;Assistance with cooking/housework;Assist for transportation;Direct supervision/assist for financial management;Help with stairs or ramp for entrance;Direct supervision/assist for medications management   Equipment Recommendations  Wheelchair (measurements PT);Wheelchair cushion (measurements PT);Hospital bed;Other (comment) (hoyer lift; petite size for WC as pt is 30f 9")    Recommendations for Other Services Rehab consult     Precautions / Restrictions Precautions Precautions: Fall;Other (comment) Precaution Comments: cortrak (PM feeds only), flexiseal (falls out easily), sacral wound; RUE restricted/no BP;  L brachial BP painful try wrist Restrictions Weight Bearing Restrictions: No     Mobility  Bed Mobility Overal bed mobility: Needs Assistance Bed Mobility: Rolling Rolling: Mod assist         General bed mobility comments: Cues to reach for either bed rail and to flex knees, modA to roll each direction to place lift pad    Transfers Overall transfer level: Needs assistance Equipment used: Ambulation equipment used Transfers: Sit to/from Stand, Bed to chair/wheelchair/BSC Sit to Stand: +2 physical assistance, Mod assist, +2 safety/equipment (with stedy)           General transfer comment: maximove used to transfer from bed to recliner. Patient performed 3 stands from recliner to stedy with mod assist x2. Transfer via Lift Equipment: Maximove  Ambulation/Gait               General Gait Details: UScientific laboratory technicianRankin (Stroke Patients Only)       Balance Overall balance assessment: Needs assistance Sitting-balance support: Bilateral upper extremity supported Sitting balance-Leahy Scale: Poor Sitting balance - Comments: leaning in recliner to relieve pressure Postural  control: Left lateral lean, Posterior lean, Right lateral lean Standing balance support: Bilateral  upper extremity supported Standing balance-Leahy Scale: Poor Standing balance comment: mod assist x2 to stand and for balance in stedy                            Cognition Arousal/Alertness: Awake/alert Behavior During Therapy: Flat affect Overall Cognitive Status: Impaired/Different from baseline Area of Impairment: Attention, Following commands, Safety/judgement, Problem solving, Awareness, Orientation                 Orientation Level: Disoriented to, Time, Situation, Place Current Attention Level: Sustained   Following Commands: Follows one step commands inconsistently, Follows one step commands with increased time Safety/Judgement: Decreased awareness of safety, Decreased awareness of deficits Awareness: Intellectual Problem Solving: Slow processing, Decreased initiation, Difficulty sequencing, Requires verbal cues, Requires tactile cues General Comments: alert and apprehensive about getting out of bed due to pain at wound site; needs reminders to not pull on various objects or lean very hard laterally to maintain safety, pt needing repeated reminders foley and flexiseal in place as pt repeatedly asking to be taken to [facility]        Exercises General Exercises - Lower Extremity Long Arc Quad: AROM, Right, Left, 5 reps, Seated Other Exercises Other Exercises: SUE row with yellow therband x5 on L    General Comments        Pertinent Vitals/Pain Pain Assessment Pain Assessment: Faces Faces Pain Scale: Hurts even more Pain Location: sacral wound Pain Descriptors / Indicators: Restless, Grimacing, Guarding, Moaning Pain Intervention(s): Monitored during session, Repositioned, Limited activity within patient's tolerance    Home Living                          Prior Function            PT Goals (current goals can now be found in the care plan section) Acute Rehab PT Goals Patient Stated Goal: to reduce the pain PT Goal Formulation: With  patient/family Time For Goal Achievement: 05/02/22    Frequency    Min 3X/week      PT Plan      Co-evaluation              AM-PAC PT "6 Clicks" Mobility   Outcome Measure  Help needed turning from your back to your side while in a flat bed without using bedrails?: A Lot Help needed moving from lying on your back to sitting on the side of a flat bed without using bedrails?: Total Help needed moving to and from a bed to a chair (including a wheelchair)?: Total Help needed standing up from a chair using your arms (e.g., wheelchair or bedside chair)?: Total Help needed to walk in hospital room?: Total Help needed climbing 3-5 steps with a railing? : Total 6 Click Score: 7    End of Session   Activity Tolerance: Patient limited by pain Patient left: in chair;with call bell/phone within reach;with chair alarm set Nurse Communication: Mobility status;Need for lift equipment PT Visit Diagnosis: Muscle weakness (generalized) (M62.81);Other abnormalities of gait and mobility (R26.89);Pain;Unsteadiness on feet (R26.81);Difficulty in walking, not elsewhere classified (R26.2) Pain - part of body:  (sacrum)     Time: 8341-9622 PT Time Calculation (min) (ACUTE ONLY): 37 min  Charges:  $Therapeutic Exercise: 8-22 mins $Therapeutic Activity: 8-22 mins  Audry Riles. PTA Acute Rehabilitation Services Office: [place] 04/28/2022, 12:20 PM

## 2022-04-28 NOTE — Progress Notes (Signed)
Mobility Specialist Progress Note:   04/28/22 1300  Mobility  Activity Transferred from chair to bed  Level of Assistance +2 (takes two people)  Assistive Device MaxiMove  Activity Response Tolerated fair  $Mobility charge 1 Mobility   Pt received in chair needing to get back to bed. No complaints of pain. Left in bed with call bell in reach and all needs met.   [facility] Rheba Diamond Mobility Specialist

## 2022-04-28 NOTE — Progress Notes (Addendum)
[facility] [place]  Subjective  - no change   Objective (!) 131/59 (!) 109 97.9 F (36.6 C) (Oral) 18 100%  Intake/Output Summary (Last 24 hours) at 04/28/2022 0738 Last data filed at 04/28/2022 0413 Gross per 24 hour  Intake 954.67 ml  Output 1700 ml  Net -745.33 ml     Abdomin incision healing with beefy red base dry 4 x 4  Feet warm  Speech clear Ambulating with assistance  Assessment/Planning: 66 y.o. female is s/p:  Aortobifemoral bypass graft  36 Days Post-Op  Feet are warm, well perfused with minimal active motion Abdomin healing Dressing changes BID dry  Pending further discussion regarding PO intake Pending possible debridement of sacral wound   Roxy Horseman 04/28/2022 7:38 AM --  VASCULAR STAFF ADDENDUM: I have independently interviewed and examined the patient. I agree with the above.  A long discussion with Itzy and her husband regarding her current overall clinical status. She continues to improve from a postoperative standpoint, but is weak and needs significant rehab. She has been transition to a normal diet.  We will continue tube feeds for the time being but appreciate turning them off earlier than usual in an effort for her to have breakfast. I was honest with Gaelyn that it would take significant rehab to improve her nonambulatory status.  I told her I am worried that should she not participate with physical therapy on a daily basis, she could end up needing a wheelchair. I appreciate general surgery seeing her sacral wound, we will continue to monitor this Midline incision appears healthy Major barriers: Sacral wound, p.o. intake, ambulatory status.  Hold on PEG at this time  Cassandria Santee, MD [facility] Phone Number: [phone] 04/28/2022 4:28 PM     Laboratory Lab Results: Recent Labs    04/28/22 0500  WBC 11.8*  HGB 9.0*  HCT 29.6*  PLT 328    BMET Recent Labs    04/28/22 0500  NA 137  K 3.4*  CL 106  CO2 23  GLUCOSE 249*  BUN 21  CREATININE 0.61  CALCIUM 8.3*    COAG Lab Results  Component Value Date   INR 1.1 03/27/2022   INR 1.2 03/26/2022   INR 1.1 03/25/2022   No results found for: "PTT"

## 2022-04-29 ENCOUNTER — Encounter (HOSPITAL_COMMUNITY)
Admission: RE | Disposition: A | Discharge status: Other Acute Inpatient Hospital | Payer: Self-pay | Source: Home / Self Care | Attending: Vascular Surgery

## 2022-04-29 LAB — GLUCOSE, CAPILLARY
Glucose-Capillary: 113 mg/dL — ABNORMAL HIGH (ref 70–99)
Glucose-Capillary: 126 mg/dL — ABNORMAL HIGH (ref 70–99)
Glucose-Capillary: 139 mg/dL — ABNORMAL HIGH (ref 70–99)
Glucose-Capillary: 162 mg/dL — ABNORMAL HIGH (ref 70–99)
Glucose-Capillary: 221 mg/dL — ABNORMAL HIGH (ref 70–99)
Glucose-Capillary: 66 mg/dL — ABNORMAL LOW (ref 70–99)
Glucose-Capillary: 93 mg/dL (ref 70–99)

## 2022-04-29 LAB — APTT: aPTT: 42 seconds — ABNORMAL HIGH (ref 24–36)

## 2022-04-29 SURGERY — INSERTION, PEG TUBE
Anesthesia: General

## 2022-04-29 MED ORDER — INSULIN DETEMIR 100 UNIT/ML ~~LOC~~ SOLN
19.0000 [IU] | Freq: Two times a day (BID) | SUBCUTANEOUS | Status: DC
  Administered 2022-04-29 – 2022-05-01 (×4): 19 [IU] via SUBCUTANEOUS
  Filled 2022-04-29 (×6): qty 0.19

## 2022-04-29 MED ORDER — DEXTROSE 50 % IV SOLN
INTRAVENOUS | Status: AC
  Administered 2022-04-29: 12.5 g via INTRAVENOUS
  Filled 2022-04-29: qty 50

## 2022-04-29 MED ORDER — DEXTROSE 50 % IV SOLN: 12.5000 g | INTRAVENOUS | Status: AC

## 2022-04-29 MED ORDER — DRONABINOL 2.5 MG PO CAPS
2.5000 mg | ORAL_CAPSULE | Freq: Two times a day (BID) | ORAL | Status: DC
  Administered 2022-04-29 – 2022-05-01 (×5): 2.5 mg via ORAL
  Filled 2022-04-29 (×5): qty 1

## 2022-04-29 NOTE — Progress Notes (Signed)
Physical Therapy Wound Treatment Patient Details  Name: Gabrielle [name] MRN: [id number] Date of Birth: 1956/01/15  Today's Date: 04/29/2022 Time: 5284-1324 Time Calculation (min): 41 min  Subjective  Subjective Assessment Subjective: Pt reporting pain at times. Patient and Family Stated Goals: for wound to heal Date of Onset:  (unknown) Prior Treatments: dressing changes  Pain Score:  Pre-medicated, fair pain tolerance  Wound Assessment  Pressure Injury 03/30/22 Sacrum Posterior;Right;Left Unstageable - Full thickness tissue loss in which the base of the injury is covered by slough (yellow, tan, gray, green or brown) and/or eschar (tan, brown or black) in the wound bed. (Active)  Wound Image  04/27/22 1353  Dressing Type Foam - Lift dressing to assess site every shift;Barrier Film (skin prep);Gauze (Comment);Other (Comment) 04/29/22 1322  Dressing Clean, Dry, Intact 04/29/22 1322  Dressing Change Frequency Daily 04/29/22 1322  State of Healing Eschar 04/29/22 1322  Site / Wound Assessment Yellow;Pink;Pale 04/29/22 1322  % Wound base Red or Granulating 90% 04/29/22 1322  % Wound base Yellow/Fibrinous Exudate 10% 04/29/22 1322  % Wound base Black/Eschar 0% 04/29/22 1322  % Wound base Other/Granulation Tissue (Comment) 0% 04/29/22 1322  Peri-wound Assessment Erythema (non-blanchable);Pink;Purple 04/29/22 1322  Wound Length (cm) 9.2 cm 04/27/22 1353  Wound Width (cm) 12 cm 04/27/22 1353  Wound Depth (cm) 3.3 cm 04/27/22 1353  Wound Surface Area (cm^2) 110.4 cm^2 04/27/22 1353  Wound Volume (cm^3) 364.32 cm^3 04/27/22 1353  Tunneling (cm) 0 04/27/22 1353  Undermining (cm) 2.3 cm from 11:00-12:00, 2.6 cm from 1:00-3:00 04/27/22 1353  Margins Unattached edges (unapproximated) 04/29/22 1322  Drainage Amount Moderate 04/29/22 1322  Drainage Description Serosanguineous 04/29/22 1322  Treatment Debridement (Selective);Irrigation;Packing (Dry gauze);Other (Comment) 04/29/22 1322    Hydrotherapy Pulsed lavage therapy - wound location: sacral Pulsed Lavage with Suction (psi): 12 psi Pulsed Lavage with Suction - Normal Saline Used: 1000 mL Pulsed Lavage Tip: Tip with splash shield Selective Debridement Selective Debridement - Location: sacral Selective Debridement - Tools Used: Forceps, Scalpel Selective Debridement - Tissue Removed: Yellow necrotic tissue    Wound Assessment and Plan  Wound Therapy - Assess/Plan/Recommendations Wound Therapy - Clinical Statement: Pt's wound is continuing to make gradual progress. Only a small amount of necrotic tissue was able to be selectively debrided as there was only 10% yellow nonviable tissue. Pt may be approaching a plateau with hydrotherapy. Coordinated with PA Brooke in regards to reducing hydrotherapy frequency and if the wound continues to improve/plateau with hydrotherapy then hydrotherapy may be discharged. Pt would continue to benefit from hydrotherapy to further remove necrotic tissue and improve wound healing. Wound Therapy - Functional Problem List: functional debility after prolonged time in bed. Factors Delaying/Impairing Wound Healing: Immobility, Multiple medical problems, Vascular compromise, Diabetes Mellitus Hydrotherapy Plan: Dressing change, Debridement, Pulsatile lavage with suction, Patient/family education Wound Therapy - Frequency: Other (comment) (2x/week; T&F) Wound Therapy - Current Recommendations: PT Wound Therapy - Follow Up Recommendations: dressing changes by RN, dressing changes by family/patient  Wound Therapy Goals- Improve the function of patient's integumentary system by progressing the wound(s) through the phases of wound healing (inflammation - proliferation - remodeling) by: Wound Therapy Goals - Improve the function of patient's integumentary system by progressing the wound(s) through the phases of wound healing by: Decrease Necrotic Tissue to: 5% Decrease Necrotic Tissue - Progress:  Progressing toward goal Increase Granulation Tissue to: 95% Increase Granulation Tissue - Progress: Progressing toward goal Improve Drainage Characteristics: Min, Serous Improve Drainage Characteristics - Progress: Progressing toward goal Goals/treatment plan/discharge  plan were made with and agreed upon by patient/family: Yes Time For Goal Achievement: 7 days Wound Therapy - Potential for Goals: Good  Goals will be updated until maximal potential achieved or discharge criteria met.  Discharge criteria: when goals achieved, discharge from hospital, MD decision/surgical intervention, no progress towards goals, refusal/missing three consecutive treatments without notification or medical reason.  GP     Charges PT Wound Care Charges $Wound Debridement up to 20 cm: < or equal to 20 cm $PT Hydrotherapy Dressing: 2 dressings $PT Hydrotherapy Visit: 1 Visit   Moishe Spice, PT, DPT Acute Rehabilitation Services  Office: [phone]       Orvan Falconer 04/29/2022, 1:28 PM

## 2022-04-29 NOTE — Progress Notes (Addendum)
  Progress Note    04/29/2022 7:52 AM 39 Days Post-Op  Subjective:  no complaints  Tm 99.2 HR 100's 782'N-562'Z systolic 30% RA  Vitals:   04/29/22 0419 04/29/22 0455  BP: 140/73 137/75  Pulse: (!) 106 (!) 102  Resp: (!) 25 18  Temp: 99.2 F (37.3 C) 99.2 F (37.3 C)  SpO2: 98% 97%    Physical Exam: General:  no distress Lungs:  non labored Incisions:  midline incision with good granulation tissue Abdomen:  soft, NT  CBC    Component Value Date/Time   WBC 11.8 (H) 04/28/2022 0500   RBC 3.21 (L) 04/28/2022 0500   HGB 9.0 (L) 04/28/2022 0500   HGB 14.5 03/22/2021 0916   HCT 29.6 (L) 04/28/2022 0500   HCT 43.9 03/22/2021 0916   PLT 328 04/28/2022 0500   PLT 261 03/22/2021 0916   MCV 92.2 04/28/2022 0500   MCV 90 03/22/2021 0916   MCH 28.0 04/28/2022 0500   MCHC 30.4 04/28/2022 0500   RDW 16.9 (H) 04/28/2022 0500   RDW 12.5 03/22/2021 0916   LYMPHSABS 3.9 04/20/2022 0600   MONOABS 0.7 04/20/2022 0600   EOSABS 0.1 04/20/2022 0600   BASOSABS 0.0 04/20/2022 0600    BMET    Component Value Date/Time   NA 137 04/28/2022 0500   NA 137 03/22/2021 0916   K 3.4 (L) 04/28/2022 0500   CL 106 04/28/2022 0500   CO2 23 04/28/2022 0500   GLUCOSE 249 (H) 04/28/2022 0500   BUN 21 04/28/2022 0500   BUN 11 03/22/2021 0916   CREATININE 0.61 04/28/2022 0500   CREATININE 0.74 11/11/2015 1921   CALCIUM 8.3 (L) 04/28/2022 0500   GFRNONAA >60 04/28/2022 0500   GFRNONAA >89 11/11/2015 1921   GFRAA 78 10/23/2020 1701   GFRAA >89 11/11/2015 1921    INR    Component Value Date/Time   INR 1.1 03/27/2022 0510     Intake/Output Summary (Last 24 hours) at 04/29/2022 0752 Last data filed at 04/29/2022 0004 Gross per 24 hour  Intake 165 ml  Output 1175 ml  Net -1010 ml     Assessment/Plan:  66 y.o. female is s/p:  Aortobifemoral bypass graft   39 Days Post-Op   -pt awake this am-no complaints -abdominal dressing changed-continues to look good.   -continue to  encourage po intake.   -continue care of sacral wound -continue oob and mobilizing to build strength -DVT prophylaxis:  sq heparin   Leontine Locket, PA-C [facility] [phone] 04/29/2022 7:52 AM  VASCULAR STAFF ADDENDUM: I have independently interviewed and examined the patient. I agree with the above.  Would appreciate trial of Marinol for appetite stimulation Dry dressing changes to abd twice daily.  Aggressive PT. OT. SLP Sacral wound healing slowly    Cassandria Santee, MD [facility] Phone Number: [phone] 04/29/2022 8:11 AM

## 2022-04-29 NOTE — Progress Notes (Signed)
Progress Note   Patient: Gabrielle [name] HQI:[id number] DOB: 1956-05-02 DOA: 03/21/2022     39 DOS: the patient was seen and examined on 04/29/2022   Brief hospital course: Reason for consultation medical management of leukocytosis, metabolic encephalopathy, hypernatremia and T2DM.   66 yo female with the past medical history of peripheral vascular disease, hypertension, dyslipidemia, T2DM, and rectal cancer who presented for aortobifemoral bypass due to critical limb ischemia on 03/21/22. Post operative course has been complicated by shock, in the setting of stress induced cardiomyopathy. Patient required invasive mechanical ventilation 03/23/22 and successfully liberated from mechanical ventilation on 04/02/22.  She developed ileus, requiring TPN.  Currently ileus has resolved and speech therapy has recommended dysphagia 1 diet.   Positive change in mentation, work up with head CT was negative for ischemic changes.   Patient with persistent leukocytosis since 05/09, more elevation for the last 3 days, up to 20,8.     Assessment and Plan:  Postoperative cardiogenic shock Acute systolic heart failure Patient required ICU admission and initiation of vasopressin and norepinephrine for management of shock. Cardiology consulted. Serial Transthoracic Echocardiogram significant for EF of 55-60% > 20-25% > 55-60%. Patient diuresed with improvement of clinical status. Shock resolved and vasopressors weaned off. Resolved.   Retroperitoneal hematoma Possible contributed to above problem. Large hematoma measuring 11  x 11.2 x 15.4 cm noted on CT abdomen/pelvis on 6/6. Discussion with vascular surgery with recommendations for antibiotic coverage -Zosyn IV with plan for long term suppressive therapy until hematoma resolves -Likely can obtain follow-up CT on 6/20 to see if hematoma is decreasing in size.  Also will need to follow serial hemoglobin to make sure not decreasing which could be symptom of  worsening or increasing size of hematoma   Abdominal incision wound Evaluated and managed by vascular surgery. -No signs of superimposed infection.   Acute respiratory failure with hypoxia Patient required ICU admission and intubation for respiratory distress. Patient successfully extubated. Weaned to room air. Resolved.   Critical limb ischemia PAD Hyperlipidemia Patient initially admitted for aortobifemoral bypass by vascular surgery. Currently stable. -Continue aspirin 81 mg daily and Crestor 40 mg daily   Elevated aPTT Unsure of etiology. Patient is on heparin 5000 units subcutaneously TID. aPTT has been trending normally. -Recheck aPTT periodically   Complicated UTI, resolved Associated fevers. Urine culture (6/3) significant for E. Coli bacteruria. Patient empirically treated with Zosyn which has transitioned to Keflex. Blood cultures (6/2) with no growth. Completed antibiotic course for treatment.   Urinary incontinence Urine is soaking sacral wound dressing. -Foley placement 1/32   Acute metabolic encephalopathy In setting of ICU admission/shock/UTI. Patient mental status appears to be improved.   Diabetes mellitus, type 2 Uncontrolled with hyperglycemia. Patient is on Januvia, metformin and Wilder Glade as an outpatient. Januvia and [place] held on admission. Started on insulin this admission. Issues with control secondary to tube feeds. Tradjenta started while inpatient. Blood sugar uncontrolled. Discontinued metformin and Tradjenta while inpatient. Better control of blood sugar achieved with current regimen at this time. -Continue Levemir 18 units BID and SSI -Continue to follow CBGs and adjust hypoglycemic regimen as needed.   -Tube feedings as expected elevated blood sugar; but due to lack of oral intake once tube feedings are held patient has experienced hypoglycemic events.   Acute diarrhea In setting of tube feedings. Started on imodium. Feces appears to be less  liquid. Diet has been changed to regular with thin liquids during the day but will continue on tube feedings  until appetite and intake increase   Severe malnutrition Dysphagia Dietitian consulted. Patient with a Cortrak NG tube on tube feeds. Received TPN while in the ICU, which is now discontinued.  Patient's PEG tube placement have been deferred until after the weekend per vascular surgery. Malnutrition complicated by dysphagia; speech therapy is on board. Patient continues to have poor oral intake with poor motivation for improving intake. Have petitioned family to be more proactive in patient's nutrition intake.  Most recent evaluation recommending regular diet consistency with thin liquids. -Dietitian recommendations (6/4): Change to nocturnal tube feeding via Cortrak:  Vital 1.5 at 70 ml/hr x 14 hours (980 ml total) Pro-Source TF 90 mL BID Provides 1630 kcals (85% of estimated needs), 110 g of protein (100% of minimum estimated needs), 749 mL of free water. Free water of 100 mL q 6 hours with TF provides 1148 mL of free water. Continue Juven BID per tube, each packet provides 80 calories, 8 grams of carbohydrate, 2.5  grams of protein (collagen), 7 grams of L-arginine and 7 grams of L-glutamine; supplement contains CaHMB, Vitamins C, E, B12 and Zinc to promote wound healing. Continue Magic cup TID with meals, each supplement provides 290 kcal and 9 grams of protein. Continue Ensure Plus High Protein po TID, each supplement provides 350 kcal and 20 grams of protein. Feeding assistance at meal times and with supplements. -PEG tube per general surgery; if unable to tolerate adequate oral intake. 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  -Continue hydrotherapy -Continue as needed analgesics. -There is a large deep center in the decubitus that we will need to have significant secondary intention and granular feeling which may be difficult given patient's degree of disc mobility and requirement for bedrest.   -Wound overall looks great  Severe deconditioning Once patient is medically stable she definitely require aggressive therapy before discharging back to the home environment Currently is not able to participate with aggressive therapy so not a candidate for CIR. Patient with possible admission to [facility] for further care and rehabilitation.    Subjective:  Mild hypoglycemic event after holding tube feedings at midnight; no chest pain, no nausea, no vomiting.  Patient is afebrile.  Good saturation on room air.  Chronically ill, deconditioned and is still experiencing difficulty eating and maintaining adequate oral nutrition.  Physical Exam: Vitals:   04/29/22 0500 04/29/22 0820 04/29/22 1206 04/29/22 1534  BP:  136/73 124/77 121/71  Pulse:  (!) 101 95 99  Resp:  20 (!) 22 16  Temp:  98.4 F (36.9 C) 98.1 F (36.7 C) 98.4 F (36.9 C)  TempSrc:  Oral Oral Oral  SpO2:  95% 100% 92%  Weight: 75.5 kg     Height:       General exam: Alert, awake, oriented x 3; in no acute distress.  Continue to be generally weak and deconditioned; appetite is poor.  Hypoglycemic event while helping tube feedings after midnight. Respiratory system: Clear to auscultation. Respiratory effort normal.  No using accessory muscles.  Good saturation on room air. Cardiovascular system:RRR. No murmurs, rubs, gallops.  No edema. Gastrointestinal system: Abdomen is nondistended, soft and nontender.  Abdominal  wound appears  to be stable without signs of superimposed infection.  Refer to epic media for illustration.  Positive bowel sounds Central nervous system: No focal neurological deficits. Extremities: No cyanosis or clubbing. Skin: No petechiae.;  Unstageable sacral ulcer, which appears to be healing.  Receiving hydrotherapy.  No purulent drainage appreciated. Psychiatry: Judgement and insight appear normal.  Flat affect.  Data Reviewed: CBG ranging from 66-130 (especially when tube feedings are held).  Comprehensive metabolic panel demonstrating sodium 137, potassium 3.4, chloride 106, blood sugar in the 1 90-2 50; BUN 21 and creatinine 0.61.  Normal LFTs.  Albumin 2.2 CBC: WBCs 11.8, hemoglobin 9.0, platelet count 328 K.   Family Communication: Husband at bedside.  Disposition: Status is: Inpatient  Remains inpatient appropriate because: Level of care remains high.  Patient continues to remain medically unstable in regards to ability to return to the home environment and is requiring aggressive wound care that includes hydrotherapy.  She currently requires a core track feeding tube for nocturnal tube feedings but has been started on oral diet and it is hopeful that as appetite and intake improves the core track tube can be removed.  Possible LTACH admission for further care and rehabilitation; physical therapy has continue recommending possible CIR if patient continues to improve.  Nutritional service recommending PEG tube placement.   Planned Discharge Destination: Rehab/further care at [facility] versus SNF  Time spent: Greater than 30 minutes  Author: Barton Dubois, MD  04/29/2022 5:25 PM    For on call review [email or web address].

## 2022-04-29 NOTE — Progress Notes (Addendum)
Inpatient Rehab Admissions Coordinator Note:   Per updated therapy recommendations patient was screened for CIR candidacy by Michel Santee, PT. At this time, pt appears to be a potential candidate for CIR. I will place an order for rehab consult for full assessment, per our protocol.  Please contact me any with questions.  1615: LTACH possible per discussion with RN CM.  We will follow up next week if she does not transition to [facility].    Shann Medal, PT, DPT [phone] 04/29/22 1:54 PM

## 2022-04-29 NOTE — Progress Notes (Signed)
Hypoglycemic Event  CBG: 66  Treatment: D50 25 mL (12.5 gm)  Symptoms: None  Follow-up CBG: [id number] CBG Result:113  Possible Reasons for Event: Inadequate meal intake, NPO for possible PEG placement  Comments/MD notified:Burnis Medin

## 2022-04-29 NOTE — Progress Notes (Addendum)
Speech Language Pathology Treatment: Dysphagia  Patient Details Name: Gabrielle [name] MRN: [id number] DOB: August 03, 1956 Today's Date: 04/29/2022 Time: 4970-2637 SLP Time Calculation (min) (ACUTE ONLY): 15 min  Assessment / Plan / Recommendation Clinical Impression  Pt eating lunch with husband's assist when SLP arrived. Increased head of bed as she was reclined to suboptimal position for po's and reiterated upright position. She was consuming tuna fish sandwich on soft white bread without upper denture place. Able to masticate with mildly prolonged transit and husband states she was able to chew softer items without dentures. She was also internally distracted by pain with rectal tube and moaning, grimacing. Helped husband reposition. No signs of aspiration with regular texture or straw sips thin liquid. Husband states she has been eating more over past few days but reports frustration with kitchen not sending food at promised times. He also stated she was supposed to be NPO for PEG placement that he didn't know anything about. Unknown how much nutrition/calories she is consuming. Should calorie count be started? Encouraged pt to eat as much as she can to hopefully avoid a PEG. Spoke with dietitian, Nira Conn, several day ago about starting tube feeds earlier in the day and stopping earlier in the morning to promote hunger or possibly reducing her volumes if appropriate.    HPI HPI: Patient is a 66 y/o female who presents on 5/8 for open aortobifemoral bypass and Bilateral femoral endarterectomy complicated by MI, heart failure and shock, intubated 5/10-5/20. PMH includes rectal cancer, DM2, HTN.      SLP Plan  Continue with current plan of care      Recommendations for follow up therapy are one component of a multi-disciplinary discharge planning process, led by the attending physician.  Recommendations may be updated based on patient status, additional functional criteria and insurance  authorization.    Recommendations  Diet recommendations: Regular;Thin liquid Liquids provided via: Straw;Cup Medication Administration: Whole meds with puree Supervision: Staff to assist with self feeding;Full supervision/cueing for compensatory strategies Compensations: Slow rate;Small sips/bites;Minimize environmental distractions Postural Changes and/or Swallow Maneuvers: Seated upright 90 degrees;Upright 30-60 min after meal                Oral Care Recommendations: Oral care BID Follow Up Recommendations: Skilled nursing-short term rehab (<3 hours/day) Assistance recommended at discharge: Frequent or constant Supervision/Assistance SLP Visit Diagnosis: Dysphagia, unspecified (R13.10);Cognitive communication deficit (C58.850) Plan: Continue with current plan of care           Houston Siren  04/29/2022, 1:17 PM

## 2022-04-29 NOTE — TOC Progression Note (Signed)
Transition of Care (TOC) - Progression Note  Marvetta Gibbons RN, BSN Transitions of Care Unit 4E- RN Case Manager See Treatment Team for direct phone #    Patient Details  Name: Gabrielle [name] MRN: [id number] Date of Birth: [birth year]  Transition of Care [facility]) CM/SW Contact  Gabrielle Client Romeo Rabon, RN Phone Number: 04/29/2022, 4:44 PM  Clinical Narrative:    CSW was notified by [facility] liaison Gabrielle P. That they have received insurance auth for [facility] admission.  CSW- Gabrielle [name]. And this Probation officer spoke with pt and husband at the bedside to discuss transition options SNF vs LTACH, and what services could be offered at each. Bedside RN also present during conversation.  Husband asking about SNF on [employer]- and explained that pt could not continue with current treatment at skilled facility and would need to be at a lower lever of care before transition to a SNF. Husband voiced he has questions about plan of care regarding feeing tube and feels like providers are not speaking to each other regarding plan of care.  Explained LTACH-[facility] and that services there could be provided at a higher level of care- and continue at the current level that pt is at. Explained [facility] as bed availability and ready to offer a bed with insurance approval. Husband voiced interest- however he wants to discuss with providers here.  Information about [facility] to be provided to husband along with liaison's contact.   1600- spoke with Gabrielle [name] at [facility] again- they still have bed availability for admission over the weekend should pt/husband agree to bed offer and transition to [facility]. MD aware. Gabrielle [name] to reach out to husband to see if he has any questions and will f/u with [facility] team in am for decisions about LTACH transition.  CM also spoke with Gabrielle [name] at [facility] regarding consult- she will follow up on Monday should family decide not to transition to [facility].     Expected Discharge Plan:  (TBD) Barriers to  Discharge: Continued Medical Work up  Expected Discharge Plan and Services Expected Discharge Plan:  (TBD) In-house Referral: Clinical Social Work Discharge Planning Services: CM Consult Post Acute Care Choice: Long Term Acute Care (LTAC) Living arrangements for the past 2 months: Single Family Home                   DME Agency: NA       HH Arranged: NA           Social Determinants of Health (SDOH) Interventions    Readmission Risk Interventions     No data to display

## 2022-04-30 DIAGNOSIS — L8990 Pressure ulcer of unspecified site, unspecified stage: Secondary | ICD-10-CM | POA: Diagnosis not present

## 2022-04-30 DIAGNOSIS — N390 Urinary tract infection, site not specified: Secondary | ICD-10-CM | POA: Diagnosis present

## 2022-04-30 DIAGNOSIS — K661 Hemoperitoneum: Secondary | ICD-10-CM

## 2022-04-30 LAB — GLUCOSE, CAPILLARY
Glucose-Capillary: 112 mg/dL — ABNORMAL HIGH (ref 70–99)
Glucose-Capillary: 127 mg/dL — ABNORMAL HIGH (ref 70–99)
Glucose-Capillary: 140 mg/dL — ABNORMAL HIGH (ref 70–99)
Glucose-Capillary: 145 mg/dL — ABNORMAL HIGH (ref 70–99)
Glucose-Capillary: 166 mg/dL — ABNORMAL HIGH (ref 70–99)
Glucose-Capillary: 170 mg/dL — ABNORMAL HIGH (ref 70–99)
Glucose-Capillary: 182 mg/dL — ABNORMAL HIGH (ref 70–99)

## 2022-04-30 LAB — APTT: aPTT: 29 seconds (ref 24–36)

## 2022-04-30 MED ORDER — CYCLOBENZAPRINE HCL 10 MG PO TABS
5.0000 mg | ORAL_TABLET | Freq: Every evening | ORAL | Status: DC | PRN
Start: 2022-04-30 — End: 2022-05-01

## 2022-04-30 NOTE — Progress Notes (Addendum)
  Progress Note    04/30/2022 7:34 AM 65 Days Post-Op  Subjective:  wants the head of her bed down  Afebrile HR 100's 696'E-952'W systolic 41% RA  Vitals:   04/30/22 0400 04/30/22 0731  BP:  (!) 142/91  Pulse:  (!) 102  Resp:  19  Temp: 98.9 F (37.2 C) 98.4 F (36.9 C)  SpO2:  93%    Physical Exam: General:  wants the head of her bed down and upset about this Lungs:  non labored Abdomen:  soft, NT  CBC    Component Value Date/Time   WBC 11.8 (H) 04/28/2022 0500   RBC 3.21 (L) 04/28/2022 0500   HGB 9.0 (L) 04/28/2022 0500   HGB 14.5 03/22/2021 0916   HCT 29.6 (L) 04/28/2022 0500   HCT 43.9 03/22/2021 0916   PLT 328 04/28/2022 0500   PLT 261 03/22/2021 0916   MCV 92.2 04/28/2022 0500   MCV 90 03/22/2021 0916   MCH 28.0 04/28/2022 0500   MCHC 30.4 04/28/2022 0500   RDW 16.9 (H) 04/28/2022 0500   RDW 12.5 03/22/2021 0916   LYMPHSABS 3.9 04/20/2022 0600   MONOABS 0.7 04/20/2022 0600   EOSABS 0.1 04/20/2022 0600   BASOSABS 0.0 04/20/2022 0600    BMET    Component Value Date/Time   NA 137 04/28/2022 0500   NA 137 03/22/2021 0916   K 3.4 (L) 04/28/2022 0500   CL 106 04/28/2022 0500   CO2 23 04/28/2022 0500   GLUCOSE 249 (H) 04/28/2022 0500   BUN 21 04/28/2022 0500   BUN 11 03/22/2021 0916   CREATININE 0.61 04/28/2022 0500   CREATININE 0.74 11/11/2015 1921   CALCIUM 8.3 (L) 04/28/2022 0500   GFRNONAA >60 04/28/2022 0500   GFRNONAA >89 11/11/2015 1921   GFRAA 78 10/23/2020 1701   GFRAA >89 11/11/2015 1921    INR    Component Value Date/Time   INR 1.1 03/27/2022 0510     Intake/Output Summary (Last 24 hours) at 04/30/2022 0734 Last data filed at 04/29/2022 1500 Gross per 24 hour  Intake --  Output 800 ml  Net -800 ml     Assessment/Plan:  66 y.o. female is s/p:  Aortobifemoral bypass grafting  40 Days Post-Op   -pt awake and alert this am.   -abdomen is soft -continue to encourage po intake. TF off in am -continue care of sacral  wound -continue aggressive PT/OT -DVT prophylaxis:  sq heparin -pt wants the head of her bed down.  RN reports that she lowered it this morning as flat as it could go.    Leontine Locket, PA-C [facility] [phone] 04/30/2022 7:34 AM  VASCULAR STAFF ADDENDUM: I have independently interviewed and examined the patient. I agree with the above.  Looks good overall. Discussed with Dr. Virl Cagey. OK for her to transition to [facility] for subacute recovery. Continue local care to midline and sacrum. Continue to optimize nutrition. Continue to mobilize as able.  Dr. Virl Cagey will see her again in 1 month.   [name]. Stanford Breed, MD [facility] Phone Number: [phone] 04/30/2022 11:46 AM

## 2022-04-30 NOTE — Assessment & Plan Note (Signed)
Stage IV (not present on admission).  Continue with hydrotherapy.  Continue to optimize nutrition and mobility.

## 2022-04-30 NOTE — Progress Notes (Signed)
Mobility Specialist: Progress Note   04/30/22 1200  Mobility  Activity Turned to right side;Turned to left side;Turned to back - supine  Level of Assistance +2 (takes two people)  Assistive Device None  Activity Response Tolerated fair  $Mobility charge 1 Mobility   Pt found at end of bed by RN and MS. Assisted with pericare with help from RN and NT. Pt requiring +2 assist to be picked up and repositioned in the bed. Assisted with rolling in the bed once positioned. RN and NT present in the room.   [facility] Vendela Troung Mobility Specialist Mobility Specialist 4 [place]: [phone]

## 2022-04-30 NOTE — Assessment & Plan Note (Signed)
Not present on admission. Complicated with urinary incontinence.  Urine culture positive for E Coli. Tolerated well antibiotic therapy.

## 2022-04-30 NOTE — Progress Notes (Addendum)
Pt's husband accepted the bed offer at [facility]. He wants to be here when she is D/C and he is requesting to D/C pt tomorrow. Contacted Emily at [phone] and left a VM regarding D/C pt tomorrow.   14:00 - Received call from [place]. She reports that pt can be D/C tomorrow. She will f/u tomorrow with room # and the physician's name that is going to f/u. Dr. Cathlean Sauer and RN notified.

## 2022-04-30 NOTE — Progress Notes (Signed)
Progress Note   Patient: Gabrielle [name] EOF:[id number] DOB: [birth year] DOA: 03/21/2022     40 DOS: the patient was seen and examined on 04/30/2022   Brief hospital course: Reason for consultation medical management of leukocytosis, metabolic encephalopathy, hypernatremia and T2DM.   66 yo female with the past medical history of peripheral vascular disease, hypertension, dyslipidemia, T2DM, and rectal cancer who presented for aortobifemoral bypass due to critical limb ischemia on 03/21/22. Post operative course has been complicated by shock, in the setting of stress induced cardiomyopathy. Patient required invasive mechanical ventilation 03/23/22 and successfully liberated from mechanical ventilation on 04/02/22.  She developed ileus, requiring TPN.  Currently ileus has resolved and speech therapy has recommended dysphagia 1 diet.   Positive change in mentation, work up with head CT was negative for ischemic changes.   Patient with persistent leukocytosis since 05/09.   06/03 bacteriuria, (E. Coli) treated with antibiotic therapy.   06/06 CT abdomen and pelvis showed large retroperitoneal hematoma. Patient was placed on antibiotic therapy considering infected hematoma.   06/11 urinary incontinence affecting sacral wound, foley cathter was placed.   Patient with very poor oral intake. Positive dysphagia. Patient has been on tube feedings.  May need PEG tube if not adequate po intake.   Positive sacral pressure ulcer stage IV.   Plan to transfer to long term acute care.     Assessment and Plan: * PAD (peripheral artery disease) ([place]) Aortoiliac occlusive disease.   05/08 sp aortobifemoral bypass, bilateral femoral endarterectomy.  Left sided profunda endarterectomy with bovine pericardial patch.   Continue with aspirin and blood pressure control.  Pain control with tramadol.   Dyslipidemia, continue with statin therapy.     Retroperitoneal hematoma Patient had persistent  leukocytosis.  Further work up with Ct abdomen and pelvis noted large retroperitoneal hematoma. 11, 11.2, 15,4 cm  Placed on IV Zosyn for antibiotic therapy.  Blood cultures have been no growth.  Wbc has been trending down, today is 11,8.  Plan to repeat CT abdomen and pelvis CT next week to evaluate improvement in size.       Acute systolic heart failure (HCC) Initial echocardiogram from 05.10 with reduced LV systolic function to 20 to 25%, follow up on 05/21 with improved LV systolic function to 55 to 60% with preserved RV systolic function and no significant valvular disease.   Plan to continue blood pressure monitoring.  Continue with entresto and carvedilol.   Patient has postoperative cardiogenic shock that has resolved. She was treated with vasopressin and norepinephrine.  Acute hypoxemic respiratory failure related to shock, now resolved.   Delirium Acute metabolic encephalopathy.  Patient is awake and alert, responds to simple questions. Not very communicative today.   Plan to continue with quetiapine.  Neuro checks per unit protocol.   Protein-calorie malnutrition, severe Continue with nutritional supplements.   Type 2 diabetes mellitus ([place]) Patient was placed on insulin therapy with sliding scale for glucose cover and monitoring.  Basal insulin and pre-meal dosing.   Pressure ulcer, sacrum Deep tissue pressure ulcer not able to stage.  Not present on admission.,    UTI (urinary tract infection) Not present on admission. Complicated with urinary incontinence.  Urine culture positive for E Coli. Tolerated well antibiotic therapy.   Decubitus skin ulcer Stage IV (not present on admission).  Continue with hydrotherapy.  Continue to optimize nutrition and mobility.          Subjective: Patient is feeling better, she is tolerating well tube feedings  and some po intake.   Physical Exam: Vitals:   04/30/22 0007 04/30/22 0400 04/30/22 0731 04/30/22  1107  BP: (!) 106/58  (!) 142/91 (!) 145/89  Pulse: (!) 104  (!) 102 (!) 101  Resp: '20  19 18  '$ Temp: 98.6 F (37 C) 98.9 F (37.2 C) 98.4 F (36.9 C) 98.4 F (36.9 C)  TempSrc: Oral Oral Oral Oral  SpO2: 94%  93% 100%  Weight:      Height:       Neurology awake and alert ENT with mild pallor, NG tube in place Cardiovascular with S1 and S2 present and regular Respiratory with no wheezing or rales Abdomen soft and non tender No lower extremity edema  Data Reviewed:    Family Communication: I spoke over the phone with the patient's husband about patient's  condition, plan of care, prognosis and all questions were addressed.   Disposition: Status is: Inpatient Remains inpatient appropriate because: pending transfer to [facility]  Planned Discharge Destination: LTAC      Author: Tawni Millers, MD 04/30/2022 1:14 PM  For on call review [email or web address].

## 2022-05-01 LAB — GLUCOSE, CAPILLARY
Glucose-Capillary: 187 mg/dL — ABNORMAL HIGH (ref 70–99)
Glucose-Capillary: 143 mg/dL — ABNORMAL HIGH (ref 70–99)
Glucose-Capillary: 143 mg/dL — ABNORMAL HIGH (ref 70–99)

## 2022-05-01 LAB — BASIC METABOLIC PANEL
Anion gap: 7 (ref 5–15)
BUN: 20 mg/dL (ref 8–23)
CO2: 26 mmol/L (ref 22–32)
Calcium: 8.5 mg/dL — ABNORMAL LOW (ref 8.9–10.3)
Chloride: 104 mmol/L (ref 98–111)
Creatinine, Ser: 0.64 mg/dL (ref 0.44–1.00)
GFR, Estimated: >60 mL/min (ref >60)
Glucose, Bld: 173 mg/dL — ABNORMAL HIGH (ref 70–99)
Potassium: 3.5 mmol/L (ref 3.5–5.1)
Sodium: 137 mmol/L (ref 135–145)

## 2022-05-01 LAB — CBC
HCT: 30.7 % — ABNORMAL LOW (ref 36.0–46.0)
Hemoglobin: 9.3 g/dL — ABNORMAL LOW (ref 12.0–15.0)
MCH: 28.2 pg (ref 26.0–34.0)
MCHC: 30.3 g/dL (ref 30.0–36.0)
MCV: 93 fL (ref 80.0–100.0)
Platelets: 367 10*3/uL (ref 150–400)
RBC: 3.3 MIL/uL — ABNORMAL LOW (ref 3.87–5.11)
RDW: 17.2 % — ABNORMAL HIGH (ref 11.5–15.5)
WBC: 9.4 10*3/uL (ref 4.0–10.5)
nRBC: 0 % (ref 0.0–0.2)

## 2022-05-01 LAB — APTT: aPTT: 29 seconds (ref 24–36)

## 2022-05-01 MED ORDER — QUETIAPINE FUMARATE 25 MG PO TABS
25.0000 mg | ORAL_TABLET | Freq: Every day | ORAL | 0 refills | Status: AC
  Filled 2022-05-01: qty 30, 30d supply, fill #0

## 2022-05-01 MED ORDER — PANTOPRAZOLE SODIUM 40 MG PO PACK
40.0000 mg | PACK | Freq: Every day | ORAL | 0 refills | Status: AC
  Filled 2022-05-01: qty 30, 30d supply, fill #0

## 2022-05-01 MED ORDER — OXYCODONE HCL 5 MG PO TABS: 5.0000 mg | ORAL_TABLET | Freq: Four times a day (QID) | ORAL | 0 refills | Status: AC | PRN

## 2022-05-01 MED ORDER — FREE WATER
100.0000 mL | Freq: Four times a day (QID) | 0 refills | Status: AC
  Filled 2022-05-01: qty 12000, 30d supply, fill #0

## 2022-05-01 MED ORDER — INSULIN ASPART 100 UNIT/ML IJ SOLN
0.0000 [IU] | INTRAMUSCULAR | 11 refills | Status: AC
  Filled 2022-05-01: qty 10, 11d supply, fill #0

## 2022-05-01 MED ORDER — PROSOURCE TF PO LIQD
45.0000 mL | Freq: Three times a day (TID) | ORAL | 0 refills | Status: AC
  Filled 2022-05-01: qty 4050, 30d supply, fill #0

## 2022-05-01 MED ORDER — INSULIN DETEMIR 100 UNIT/ML ~~LOC~~ SOLN
19.0000 [IU] | Freq: Two times a day (BID) | SUBCUTANEOUS | 11 refills | Status: AC
  Filled 2022-05-01: qty 10, 26d supply, fill #0

## 2022-05-01 MED ORDER — ENSURE ENLIVE PO LIQD
237.0000 mL | Freq: Three times a day (TID) | ORAL | 0 refills | Status: AC
  Filled 2022-05-01: qty 21330, 30d supply, fill #0

## 2022-05-01 MED ORDER — PIPERACILLIN-TAZOBACTAM 3.375 G IVPB: 3.3750 g | Freq: Three times a day (TID) | INTRAVENOUS | 0 refills | Status: AC

## 2022-05-01 MED ORDER — DEXTROSE 50 % IV SOLN
INTRAVENOUS | Status: AC
  Filled 2022-05-01: qty 50

## 2022-05-01 MED ORDER — CARVEDILOL 25 MG PO TABS
25.0000 mg | ORAL_TABLET | Freq: Two times a day (BID) | ORAL | 0 refills | Status: AC
  Filled 2022-05-01: qty 60, 30d supply, fill #0

## 2022-05-01 MED ORDER — ROSUVASTATIN CALCIUM 40 MG PO TABS
40.0000 mg | ORAL_TABLET | Freq: Every day | ORAL | 0 refills | Status: AC
Start: 2022-05-01 — End: 2022-05-31
  Filled 2022-05-01: qty 30, 30d supply, fill #0

## 2022-05-01 MED ORDER — JUVEN PO PACK
1.0000 | PACK | Freq: Two times a day (BID) | ORAL | 0 refills | Status: AC
  Filled 2022-05-01: qty 60, 30d supply, fill #0

## 2022-05-01 MED ORDER — GLUCERNA 1.5 CAL PO LIQD
996.0000 mL | ORAL | 0 refills | Status: AC
  Filled 2022-05-01: qty 29880, 30d supply, fill #0

## 2022-05-01 NOTE — Progress Notes (Addendum)
  Progress Note    05/01/2022 6:55 AM 41 Days Post-Op  Subjective:  pt awake and alert this morning.  No complaints.  Says she doesn't want to leave.   Afebrile VSS  Vitals:   04/30/22 2326 05/01/22 0420  BP: 126/66 (!) 142/72  Pulse:  (!) 103  Resp: (!) 23 (!) 22  Temp: 98.6 F (37 C) 98.8 F (37.1 C)  SpO2:  94%    Physical Exam: General:  no distress Lungs:  non labored Incisions:  laparotomy wound decreasing in size and is clean and dry with good granulation tissue    CBC    Component Value Date/Time   WBC 9.4 05/01/2022 0439   RBC 3.30 (L) 05/01/2022 0439   HGB 9.3 (L) 05/01/2022 0439   HGB 14.5 03/22/2021 0916   HCT 30.7 (L) 05/01/2022 0439   HCT 43.9 03/22/2021 0916   PLT 367 05/01/2022 0439   PLT 261 03/22/2021 0916   MCV 93.0 05/01/2022 0439   MCV 90 03/22/2021 0916   MCH 28.2 05/01/2022 0439   MCHC 30.3 05/01/2022 0439   RDW 17.2 (H) 05/01/2022 0439   RDW 12.5 03/22/2021 0916   LYMPHSABS 3.9 04/20/2022 0600   MONOABS 0.7 04/20/2022 0600   EOSABS 0.1 04/20/2022 0600   BASOSABS 0.0 04/20/2022 0600    BMET    Component Value Date/Time   NA 137 05/01/2022 0439   NA 137 03/22/2021 0916   K 3.5 05/01/2022 0439   CL 104 05/01/2022 0439   CO2 26 05/01/2022 0439   GLUCOSE 173 (H) 05/01/2022 0439   BUN 20 05/01/2022 0439   BUN 11 03/22/2021 0916   CREATININE 0.64 05/01/2022 0439   CREATININE 0.74 11/11/2015 1921   CALCIUM 8.5 (L) 05/01/2022 0439   GFRNONAA >60 05/01/2022 0439   GFRNONAA >89 11/11/2015 1921   GFRAA 78 10/23/2020 1701   GFRAA >89 11/11/2015 1921    INR    Component Value Date/Time   INR 1.1 03/27/2022 0510     Intake/Output Summary (Last 24 hours) at 05/01/2022 0655 Last data filed at 04/30/2022 2010 Gross per 24 hour  Intake 6355.72 ml  Output 950 ml  Net 5405.72 ml     Assessment/Plan:  66 y.o. female is s/p:  Aortobifemoral bypass grafting   41 Days Post-Op   -wound on abdomen decreasing in size and is clean  without evidence of infection.  Continue wet to dry dressings bid.  -leukocytosis improved -hgb improved to 9.3 from 9.0 on 6/15 -DVT prophylaxis:  sq heparin -should be discharging to [facility] today -she will f/u in our office in 3-4 weeks. Our office will arrange appt.    Leontine Locket, PA-C [facility] [phone] 05/01/2022 6:55 AM  VASCULAR STAFF ADDENDUM: I have independently interviewed and examined the patient. I agree with the above.   [name]. Stanford Breed, MD [facility] Phone Number: [phone] 05/01/2022 10:37 AM

## 2022-05-01 NOTE — Discharge Summary (Addendum)
Physician Discharge Summary   Patient: Gabrielle [name] MRN: [id number] DOB: [birth year]  Admit date:     03/21/2022  Discharge date: 05/01/22  Discharge Physician: Tawni Millers   PCP: Dorna Mai, MD   Recommendations at discharge:    Discontinue antibiotic therapy Zosyn on 05/02/22, please remove PICC line after completing antibiotic therapy.  Continue local wound care Follow up CT abdomen and pelvis next week, to assess retroperitoneal hematoma.  Patient has a Foley catheter due to urinary incontinence and risk of wound contamination. Patient has a rectal tube due to diarrhea and risk of wound contamination.   Discharge Diagnoses: Principal Problem:   PAD (peripheral artery disease) ([place]) Active Problems:   Retroperitoneal hematoma   Acute systolic heart failure (HCC)   Delirium   Protein-calorie malnutrition, severe   Type 2 diabetes mellitus (HCC)   Pressure ulcer, sacrum   UTI (urinary tract infection)   Decubitus skin ulcer  Resolved Problems:   * No resolved hospital problems. [facility] Course: Reason for consultation medical management of leukocytosis, metabolic encephalopathy, hypernatremia and T2DM.   66 yo female with the past medical history of peripheral vascular disease, hypertension, dyslipidemia, T2DM, and rectal cancer who presented for aortobifemoral bypass due to critical limb ischemia on 03/21/22. Post operative course has been complicated by shock, in the setting of stress induced cardiomyopathy. Patient required invasive mechanical ventilation 03/23/22 and successfully liberated from mechanical ventilation on 04/02/22.  She developed ileus, requiring TPN.  Currently ileus has resolved and speech therapy has recommended dysphagia 1 diet.   Positive change in mentation, work up with head CT was negative for ischemic changes.   Patient with persistent leukocytosis since 05/09.   06/03 bacteriuria, (E. Coli) treated with antibiotic therapy.    06/06 CT abdomen and pelvis showed large retroperitoneal hematoma. Patient was placed on antibiotic therapy considering infected hematoma.   06/11 urinary incontinence affecting sacral wound, foley cathter was placed.   Patient with very poor oral intake. Positive dysphagia. Patient has been on tube feedings.  May need PEG tube if not adequate po intake.   Positive sacral pressure ulcer stage IV.   Plan to transfer to long term acute care.     Assessment and Plan: * PAD (peripheral artery disease) ([place]) Aortoiliac occlusive disease.   05/08 sp aortobifemoral bypass, bilateral femoral endarterectomy.  Left sided profunda endarterectomy with bovine pericardial patch.   Continue with aspirin and blood pressure control.  Pain control with tramadol.   Dyslipidemia, continue with statin therapy.     Retroperitoneal hematoma Patient had persistent leukocytosis.  Further work up with Ct abdomen and pelvis noted large retroperitoneal hematoma. 11, 11.2, 15,4 cm  Placed on IV Zosyn for antibiotic therapy.  Blood cultures have been no growth.  Wbc has been trending down, today is 9.4  Patient will complete antibiotic therapy on 05/02/22.  Plan to repeat CT abdomen and pelvis CT next week to evaluate improvement in size.       Acute systolic heart failure (HCC) Initial echocardiogram from 05.10 with reduced LV systolic function to 20 to 25%, follow up on 05/21 with improved LV systolic function to 55 to 60% with preserved RV systolic function and no significant valvular disease.   Plan to continue blood pressure monitoring.  Continue with entresto and carvedilol.   Patient has postoperative cardiogenic shock that has resolved. She was treated with vasopressin and norepinephrine.  Acute hypoxemic respiratory failure related to shock, now resolved.   Delirium Acute  metabolic encephalopathy resolved.   Patient is awake and alert, responds to simple questions. Not very  communicative today.   Plan to continue with quetiapine.  Neuro checks per unit protocol.   Protein-calorie malnutrition, severe Continue with nutritional supplements.   Type 2 diabetes mellitus ([place]) Patient was placed on insulin therapy with sliding scale for glucose cover and monitoring.  Basal insulin and pre-meal dosing.   Pressure ulcer, sacrum Deep tissue pressure ulcer not able to stage.  Not present on admission.  Abdominal wound has been healing with no signs of local infection.    UTI (urinary tract infection) Not present on admission. Complicated with urinary incontinence.  Urine culture positive for E Coli. Tolerated well antibiotic therapy.   Decubitus skin ulcer Stage IV (not present on admission).  Continue with hydrotherapy.  Continue to optimize nutrition and mobility.           Consultants: cardiology, vascular and general surgery Procedures performed: as above   Disposition:  LATAC Diet recommendation:  Dysphagia 1 diet.  DISCHARGE MEDICATION:   Follow-up Information     Broadus John, MD Follow up in 4 week(s).   Specialty: Vascular Surgery Why: Office will call you to arrange your appt (sent) Contact information: [place] [id number] [phone]                Discharge Exam: Filed Weights   04/25/22 0451 04/26/22 0457 04/29/22 0500  Weight: 60.5 kg 72.5 kg 75.5 kg   BP (!) 142/72 (BP Location: Left Arm)   Pulse (!) 103   Temp 98.8 F (37.1 C) (Oral)   Resp (!) 22   Ht '4\' 9"'$  (1.448 m)   Wt 75.5 kg   SpO2 94%   BMI 36.02 kg/m   Patient is feeling well, no chest pain or dyspnea. Tolerating dysphagia diet and tube feedings.  Neurology awake and alert ENT with no pallor, NG tube in place Cardiovascular with S1 and S2 present and rhythmic Respiratory with no rales or wheezing Abdomen soft and not distended No lower extremity edema    Condition at discharge: stable  The results of significant  diagnostics from this hospitalization (including imaging, microbiology, ancillary and laboratory) are listed below for reference.   Imaging Studies: DG Abd Portable 1V  Result Date: 04/25/2022 CLINICAL DATA:  Feeding tube placement. EXAM: PORTABLE ABDOMEN - 1 VIEW COMPARISON:  04/07/2022. FINDINGS: Feeding tube is at the pylorus or within the duodenal bulb. IMPRESSION: The tube is at the pylorus or duodenal bulb. Electronically Signed   By: Lorin Picket M.D.   On: 04/25/2022 15:45   CT ABDOMEN PELVIS WO CONTRAST  Result Date: 04/19/2022 CLINICAL DATA:  anatomy evaluation for g-tube placement in IR EXAM: CT ABDOMEN AND PELVIS WITHOUT CONTRAST TECHNIQUE: Multidetector CT imaging of the abdomen and pelvis was performed following the standard protocol without IV contrast. RADIATION DOSE REDUCTION: This exam was performed according to the departmental dose-optimization program which includes automated exposure control, adjustment of the mA and/or kV according to patient size and/or use of iterative reconstruction technique. COMPARISON:  January 13, 2022 FINDINGS: Lower chest: There is bibasilar atelectasis seen. Mild cardiomegaly. Hepatobiliary: No focal liver abnormality is seen. No gallstones, gallbladder wall thickening, or biliary dilatation. Pancreas: Unremarkable. No pancreatic ductal dilatation or surrounding inflammatory changes. Spleen: Normal in size without focal abnormality. Adrenals/Urinary Tract: Bilateral adrenals and left kidney have a normal appearance. There is pelvicaliectasis seen in the right kidney. Stomach/Bowel: Stomach is within normal limits.  Appendix is not delineated. No evidence of bowel wall thickening, distention, or inflammatory changes. There is an NG tube in the body of the stomach. The transverse colon is located inferior to the stomach and the stomach may be amenable for G-tube insertion. Vascular/Lymphatic: There has been interval aortobifemoral bypass graft placement. There  is large fluid, most likely hematoma seen at the right retroperitoneal region extending from the right mid pelvis up to the inferior aspect of the liver measuring approximately 11 x 11.2 x 15.4 cm likely arising from the leak at the distal vascular anastomotic site. Reproductive: Status post hysterectomy. Stable appearance of the adnexal tissue. Other: Right retroperitoneal hematoma. There is large decubitus ulcer seen at and inferior to the coccyx. Musculoskeletal: No acute or significant osseous findings. IMPRESSION: 1. There is an NG tube with its distal end in the body of the stomach. The transverse colon is located inferior to the stomach. The stomach may be amenable for a G-tube insertion in the IR. 2. There has been interval aortobifemoral bypass graft placement. There is a large hematoma, appears subacute at the right retroperitoneal region likely arising from the distal anastomotic site measuring approximately 11 x 11.2 x 15.4 cm. 3. Large decubitus ulcer at and inferior to the tip of the coccyx. The decubitus ulcer is new since the previous study. These results will be called to the ordering clinician or representative by the Radiologist Assistant, and communication documented in the PACS or [employer]. Electronically Signed   By: Frazier Richards M.D.   On: 04/19/2022 16:33   DG CHEST PORT 1 VIEW  Result Date: 04/16/2022 CLINICAL DATA:  Systemic inflammatory response syndrome. EXAM: PORTABLE CHEST 1 VIEW COMPARISON:  Radiograph 03/25/2022 FINDINGS: Interval extubation and removal of the bilateral internal jugular central lines. Removal of prior enteric tube, there is a new weighted enteric tube with tip below the diaphragm not included in the field of view. Right upper extremity PICC tip in the SVC. Patient is rotated. There is volume loss in the right hemithorax with patchy opacity at the right lung base. Mild left infrahilar atelectasis. The heart is normal in size. No pulmonary edema, pleural  effusion, or pneumothorax. IMPRESSION: Volume loss in the right hemithorax with patchy opacity at the right lung base, favoring atelectasis. Mild left infrahilar atelectasis. Electronically Signed   By: Keith Rake M.D.   On: 04/16/2022 01:54   CT HEAD WO CONTRAST (5MM)  Result Date: 04/08/2022 CLINICAL DATA:  Mental status change with unknown cause EXAM: CT HEAD WITHOUT CONTRAST TECHNIQUE: Contiguous axial images were obtained from the base of the skull through the vertex without intravenous contrast. RADIATION DOSE REDUCTION: This exam was performed according to the departmental dose-optimization program which includes automated exposure control, adjustment of the mA and/or kV according to patient size and/or use of iterative reconstruction technique. COMPARISON:  Head CT 03/25/2022 FINDINGS: Brain: No evidence of acute infarction, hemorrhage, hydrocephalus, extra-axial collection or mass lesion/mass effect. Generalized atrophy. Chronic periventricular chronic small vessel ischemia. Vascular: No hyperdense vessel or unexpected calcification. Skull: Normal. Negative for fracture or focal lesion. Sinuses/Orbits: No acute finding. Other: Subcutaneous low-density in the right cheek, usually dermal inclusion cyst. IMPRESSION: 1. No acute finding. 2. Generalized atrophy with chronic microvascular ischemia. Electronically Signed   By: Jorje Guild M.D.   On: 04/08/2022 10:55   DG Abd Portable 1V  Result Date: 04/07/2022 CLINICAL DATA:  Abdominal pain EXAM: PORTABLE ABDOMEN - 1 VIEW COMPARISON:  04/06/2022 FINDINGS: Tip of enteric tube is  seen in the stomach. Bowel gas pattern is nonspecific. There is interval decrease in amount of small bowel gas. IMPRESSION: Tip of feeding tube is seen in the stomach. Nonspecific bowel gas pattern. Electronically Signed   By: Elmer Picker M.D.   On: 04/07/2022 08:21   DG Abd Portable 1V  Result Date: 04/06/2022 CLINICAL DATA:  NG tube placement EXAM: PORTABLE  ABDOMEN - 1 VIEW COMPARISON:  None Available. FINDINGS: Feeding tube with tip in the gastric body. Tip directed towards the antral region. Stylet removed. IMPRESSION: Feeding tube within the gastric body Electronically Signed   By: Suzy Bouchard M.D.   On: 04/06/2022 16:27   DG Abd 1 View  Result Date: 04/06/2022 CLINICAL DATA:  Ileus EXAM: ABDOMEN - 1 VIEW COMPARISON:  04/05/2022 FINDINGS: Soft feeding tube remains in place within the 3rd to 4th portion of the duodenum. There is less visible intestinal gas. No worsening or new finding. IMPRESSION: Less intestinal gas than seen yesterday. No dilated small bowel loops. Soft feeding tube tip remains in the junction of the third and fourth portion of the duodenum. Electronically Signed   By: Nelson Chimes M.D.   On: 04/06/2022 08:57   DG Abd Portable 1V  Result Date: 04/05/2022 CLINICAL DATA:  Abdominal distension EXAM: PORTABLE ABDOMEN - 1 VIEW COMPARISON:  Radiograph 03/30/2022 FINDINGS: Feeding tube tip overlies the descending duodenum. Removal of nasogastric tube. There are few prominent loops of small bowel in the left hemiabdomen, overall decreased in distension in comparison prior. There is also decreased distention of the colon. IMPRESSION: Decreased distention of small bowel and colon, with some persistent mildly distended small bowel loops in the left hemiabdomen. Feeding tube tip overlies the descending duodenum. Electronically Signed   By: Maurine Simmering M.D.   On: 04/05/2022 08:51   ECHOCARDIOGRAM LIMITED  Result Date: 04/03/2022    ECHOCARDIOGRAM LIMITED REPORT   Patient Name:   CASARA PERRIER Date of Exam: 04/03/2022 Medical Rec #:  [id number]        Height:       57.0 in Accession #:    [id number]       Weight:       127.4 lb Date of Birth:  01-24-1956        BSA:          1.485 m Patient Age:    52 years         BP:           135/72 mmHg Patient Gender: F                HR:           119 bpm. Exam Location:  Inpatient Procedure: 2D Echo,  Limited Echo, Cardiac Doppler, Color Doppler and            Intracardiac Opacification Agent Indications:    CHF  History:        Patient has prior history of Echocardiogram examinations. Risk                 Factors:Hypertension and Diabetes.  Sonographer:    Jyl Heinz Referring Phys: [phone] MIHAI CROITORU  Sonographer Comments: Done in supine. IMPRESSIONS  1. Left ventricular ejection fraction, by estimation, is 55 to 60%. The left ventricle has normal function. The left ventricle has no regional wall motion abnormalities. Left ventricular diastolic parameters are consistent with Grade I diastolic dysfunction (impaired relaxation).  2. Right ventricular systolic function is normal. The right  ventricular size is normal.  3. The mitral valve is normal in structure. No evidence of mitral valve regurgitation. No evidence of mitral stenosis.  4. The aortic valve is normal in structure. Aortic valve regurgitation is not visualized. Aortic valve sclerosis is present, with no evidence of aortic valve stenosis.  5. The inferior vena cava is normal in size with greater than 50% respiratory variability, suggesting right atrial pressure of 3 mmHg. Comparison(s): Prior images reviewed side by side. The left ventricular function has improved. The left ventricular diastolic function has improved. FINDINGS  Left Ventricle: Left ventricular ejection fraction, by estimation, is 55 to 60%. The left ventricle has normal function. The left ventricle has no regional wall motion abnormalities. Definity contrast agent was given IV to delineate the left ventricular  endocardial borders. The left ventricular internal cavity size was normal in size. There is no left ventricular hypertrophy. Left ventricular diastolic parameters are consistent with Grade I diastolic dysfunction (impaired relaxation). Indeterminate filling pressures. Right Ventricle: The right ventricular size is normal. No increase in right ventricular wall thickness. Right  ventricular systolic function is normal. Left Atrium: Left atrial size was normal in size. Right Atrium: Right atrial size was normal in size. Pericardium: There is no evidence of pericardial effusion. Mitral Valve: The mitral valve is normal in structure. No evidence of mitral valve stenosis. Tricuspid Valve: The tricuspid valve is normal in structure. Tricuspid valve regurgitation is not demonstrated. No evidence of tricuspid stenosis. Aortic Valve: The aortic valve is normal in structure. Aortic valve regurgitation is not visualized. Aortic valve sclerosis is present, with no evidence of aortic valve stenosis. Aortic valve peak gradient measures 11.7 mmHg. Pulmonic Valve: The pulmonic valve was normal in structure. Pulmonic valve regurgitation is not visualized. No evidence of pulmonic stenosis. Aorta: The aortic root is normal in size and structure. Venous: The inferior vena cava is normal in size with greater than 50% respiratory variability, suggesting right atrial pressure of 3 mmHg. IAS/Shunts: No atrial level shunt detected by color flow Doppler. LEFT VENTRICLE PLAX 2D LVIDd:         3.00 cm     Diastology LVIDs:         2.30 cm     LV e' medial:    5.66 cm/s LV PW:         0.90 cm     LV E/e' medial:  10.8 LV IVS:        1.10 cm     LV e' lateral:   6.31 cm/s LVOT diam:     1.80 cm     LV E/e' lateral: 9.7 LV SV:         35 LV SV Index:   24 LVOT Area:     2.54 cm  LV Volumes (MOD) LV vol d, MOD A2C: 65.9 ml LV vol d, MOD A4C: 59.2 ml LV vol s, MOD A2C: 32.3 ml LV vol s, MOD A4C: 30.3 ml LV SV MOD A2C:     33.6 ml LV SV MOD A4C:     59.2 ml LV SV MOD BP:      31.0 ml RIGHT VENTRICLE            IVC RV S prime:     9.64 cm/s  IVC diam: 1.10 cm TAPSE (M-mode): 1.8 cm LEFT ATRIUM         Index LA diam:    2.10 cm 1.41 cm/m  AORTIC VALVE AV Area (Vmax): 1.48 cm AV Vmax:  171.00 cm/s AV Peak Grad:   11.7 mmHg LVOT Vmax:      99.70 cm/s LVOT Vmean:     63.900 cm/s LVOT VTI:       0.139 m  AORTA Ao Root  diam: 2.70 cm MITRAL VALVE MV Area (PHT): 10.25 cm    SHUNTS MV Decel Time: 74 msec      Systemic VTI:  0.14 m MV E velocity: 60.90 cm/s   Systemic Diam: 1.80 cm MV A velocity: 102.00 cm/s MV E/A ratio:  0.60 Mihai Croitoru MD Electronically signed by Sanda Klein MD Signature Date/Time: 04/03/2022/1:08:47 PM    Final     Microbiology: Results for orders placed or performed during the hospital encounter of 03/21/22  Culture, Respiratory w Gram Stain     Status: None   Collection Time: 03/23/22  7:15 AM   Specimen: Tracheal Aspirate; Respiratory  Result Value Ref Range Status   Specimen Description TRACHEAL ASPIRATE  Final   Special Requests NONE  Final   Gram Stain   Final    ABUNDANT WBC PRESENT, PREDOMINANTLY MONONUCLEAR RARE YEAST RARE GRAM POSITIVE COCCI    Culture   Final    RARE Normal respiratory flora-no Staph aureus or Pseudomonas seen Performed at [facility], [address], [place], [place] [address]    Report Status 03/25/2022 FINAL  Final  MRSA Next Gen by PCR, Nasal     Status: None   Collection Time: 04/08/22 10:40 AM   Specimen: Nasal Mucosa; Nasal Swab  Result Value Ref Range Status   MRSA by PCR Next Gen NOT DETECTED NOT DETECTED Final    Comment: (NOTE) The GeneXpert MRSA Assay (FDA approved for NASAL specimens only), is one component of a comprehensive MRSA colonization surveillance program. It is not intended to diagnose MRSA infection nor to guide or monitor treatment for MRSA infections. Test performance is not FDA approved in patients less than 26 years old. Performed at [facility], [place] [address], [place], [place] [address]   Culture, blood (Routine X 2) w Reflex to ID Panel     Status: None   Collection Time: 04/15/22  7:33 PM   Specimen: BLOOD  Result Value Ref Range Status   Specimen Description BLOOD BLOOD LEFT ARM  Final   Special Requests   Final    BOTTLES DRAWN AEROBIC AND ANAEROBIC Blood Culture adequate volume   Culture    Final    NO GROWTH 5 DAYS Performed at [facility], [place] [address], [place], [place] [address]    Report Status 04/20/2022 FINAL  Final  Culture, blood (Routine X 2) w Reflex to ID Panel     Status: None   Collection Time: 04/15/22  7:36 PM   Specimen: BLOOD  Result Value Ref Range Status   Specimen Description BLOOD BLOOD LEFT FOREARM  Final   Special Requests   Final    BOTTLES DRAWN AEROBIC ONLY Blood Culture adequate volume   Culture   Final    NO GROWTH 5 DAYS Performed at [facility], [place] [address] [place], [place] [address]    Report Status 04/20/2022 FINAL  Final  Urine Culture     Status: Abnormal   Collection Time: 04/16/22  7:54 AM   Specimen: Urine, Catheterized  Result Value Ref Range Status   Specimen Description URINE, CATHETERIZED  Final   Special Requests   Final    NONE Performed at [facility], [address] [place], [place] [address]    Culture >=100,000 COLONIES/mL ESCHERICHIA COLI (A)  Final   Report Status 04/18/2022 FINAL  Final   Organism ID, Bacteria ESCHERICHIA COLI (A)  Final      Susceptibility   Escherichia coli - MIC*    AMPICILLIN >=32 RESISTANT Resistant     CEFAZOLIN 8 SENSITIVE Sensitive     CEFEPIME <=0.12 SENSITIVE Sensitive     CEFTRIAXONE <=0.25 SENSITIVE Sensitive     CIPROFLOXACIN >=4 RESISTANT Resistant     GENTAMICIN <=1 SENSITIVE Sensitive     IMIPENEM <=0.25 SENSITIVE Sensitive     NITROFURANTOIN <=16 SENSITIVE Sensitive     TRIMETH/SULFA <=20 SENSITIVE Sensitive     AMPICILLIN/SULBACTAM >=32 RESISTANT Resistant     PIP/TAZO <=4 SENSITIVE Sensitive     * >=100,000 COLONIES/mL ESCHERICHIA COLI    Labs: CBC: Recent Labs  Lab 04/25/22 0545 04/28/22 0500 05/01/22 0439  WBC 11.9* 11.8* 9.4  HGB 9.3* 9.0* 9.3*  HCT 30.3* 29.6* 30.7*  MCV 91.8 92.2 93.0  PLT 322 328 681   Basic Metabolic Panel: Recent Labs  Lab 04/28/22 0500 05/01/22 0439  NA 137 137  K 3.4* 3.5  CL 106 104  CO2 23 26   GLUCOSE 249* 173*  BUN 21 20  CREATININE 0.61 0.64  CALCIUM 8.3* 8.5*   Liver Function Tests: Recent Labs  Lab 04/28/22 0500  AST 26  ALT 40  ALKPHOS 62  BILITOT 0.5  PROT 6.9  ALBUMIN 2.2*   CBG: Recent Labs  Lab 04/30/22 1645 04/30/22 2006 04/30/22 2315 05/01/22 0423 05/01/22 0428  GLUCAP 182* 166* 112* 22* 187*    Discharge time spent: greater than 30 minutes.  Signed: Tawni Millers, MD [facility] 05/01/2022

## 2022-05-01 NOTE — Progress Notes (Addendum)
Pt is ready to be D/C today to [place] with [facility] provided pt's room number 323. Nurse needs to call report at [phone] or [phone]. Dr. Katherine Roan is admitting the pt. Will arrange transportation once pt receives her antibiotic and RN notifies CM. RN made aware of above.  13:50 - Received message from A. Dixon, RN, reports that pt is ready to be D/C and to call for the ambulance. Pt D/C with a PICC line, foley catheter, and a Flexiseal tube. Pt is on RA. Contacted [facility] and asked if they are able to transport pt to [facility] or [facility]. The dispatcher asked her supervisor and she reports that they are not able to transport due to the PICC line.   Contacted [facility] and arranged transportation. RN notified.   EMTALA form needs to be completed. RN and Dr. Cathlean Sauer notified of EMTALA form.

## 2022-05-01 NOTE — Progress Notes (Signed)
Pt d/c from 4E to [facility] via [place]. Report called to nurse for room 323 where pt will be received.  Raelyn Number, RN

## 2022-05-01 NOTE — Plan of Care (Signed)
Problem: Education: Goal: Knowledge of General Education information will improve Description: Including pain rating scale, medication(s)/side effects and non-pharmacologic comfort measures 05/01/2022 1306 by Raelyn Number, RN Outcome: Adequate for Discharge 05/01/2022 1306 by Raelyn Number, RN Outcome: Progressing   Problem: Health Behavior/Discharge Planning: Goal: Ability to manage health-related needs will improve 05/01/2022 1306 by Raelyn Number, RN Outcome: Adequate for Discharge 05/01/2022 1306 by Raelyn Number, RN Outcome: Progressing   Problem: Clinical Measurements: Goal: Ability to maintain clinical measurements within normal limits will improve 05/01/2022 1306 by Raelyn Number, RN Outcome: Adequate for Discharge 05/01/2022 1306 by Raelyn Number, RN Outcome: Progressing Goal: Will remain free from infection 05/01/2022 1306 by Raelyn Number, RN Outcome: Adequate for Discharge 05/01/2022 1306 by Raelyn Number, RN Outcome: Progressing Goal: Diagnostic test results will improve 05/01/2022 1306 by Raelyn Number, RN Outcome: Adequate for Discharge 05/01/2022 1306 by Raelyn Number, RN Outcome: Progressing Goal: Respiratory complications will improve 05/01/2022 1306 by Raelyn Number, RN Outcome: Adequate for Discharge 05/01/2022 1306 by Raelyn Number, RN Outcome: Progressing Goal: Cardiovascular complication will be avoided 05/01/2022 1306 by Raelyn Number, RN Outcome: Adequate for Discharge 05/01/2022 1306 by Raelyn Number, RN Outcome: Progressing   Problem: Activity: Goal: Risk for activity intolerance will decrease 05/01/2022 1306 by Raelyn Number, RN Outcome: Adequate for Discharge 05/01/2022 1306 by Raelyn Number, RN Outcome: Progressing   Problem: Nutrition: Goal: Adequate nutrition will be maintained 05/01/2022 1306 by Raelyn Number, RN Outcome: Adequate for Discharge 05/01/2022 1306 by Raelyn Number, RN Outcome:  Progressing   Problem: Coping: Goal: Level of anxiety will decrease 05/01/2022 1306 by Raelyn Number, RN Outcome: Adequate for Discharge 05/01/2022 1306 by Raelyn Number, RN Outcome: Progressing   Problem: Elimination: Goal: Will not experience complications related to bowel motility 05/01/2022 1306 by Raelyn Number, RN Outcome: Adequate for Discharge 05/01/2022 1306 by Raelyn Number, RN Outcome: Progressing   Problem: Pain Managment: Goal: General experience of comfort will improve 05/01/2022 1306 by Raelyn Number, RN Outcome: Adequate for Discharge 05/01/2022 1306 by Raelyn Number, RN Outcome: Progressing   Problem: Safety: Goal: Ability to remain free from injury will improve 05/01/2022 1306 by Raelyn Number, RN Outcome: Adequate for Discharge 05/01/2022 1306 by Raelyn Number, RN Outcome: Progressing   Problem: Skin Integrity: Goal: Risk for impaired skin integrity will decrease 05/01/2022 1306 by Raelyn Number, RN Outcome: Adequate for Discharge 05/01/2022 1306 by Raelyn Number, RN Outcome: Progressing   Problem: Education: Goal: Ability to describe self-care measures that may prevent or decrease complications (Diabetes Survival Skills Education) will improve 05/01/2022 1306 by Raelyn Number, RN Outcome: Adequate for Discharge 05/01/2022 1306 by Raelyn Number, RN Outcome: Progressing Goal: Individualized Educational Video(s) 05/01/2022 1306 by Raelyn Number, RN Outcome: Adequate for Discharge 05/01/2022 1306 by Raelyn Number, RN Outcome: Progressing   Problem: Coping: Goal: Ability to adjust to condition or change in health will improve 05/01/2022 1306 by Raelyn Number, RN Outcome: Adequate for Discharge 05/01/2022 1306 by Raelyn Number, RN Outcome: Progressing   Problem: Fluid Volume: Goal: Ability to maintain a balanced intake and output will improve 05/01/2022 1306 by Raelyn Number, RN Outcome: Adequate for  Discharge 05/01/2022 1306 by Raelyn Number, RN Outcome: Progressing   Problem: Health Behavior/Discharge Planning: Goal: Ability to identify and utilize available resources and services will improve 05/01/2022 1306 by Raelyn Number,  RN Outcome: Adequate for Discharge 05/01/2022 1306 by Raelyn Number, RN Outcome: Progressing Goal: Ability to manage health-related needs will improve 05/01/2022 1306 by Raelyn Number, RN Outcome: Adequate for Discharge 05/01/2022 1306 by Raelyn Number, RN Outcome: Progressing   Problem: Metabolic: Goal: Ability to maintain appropriate glucose levels will improve 05/01/2022 1306 by Raelyn Number, RN Outcome: Adequate for Discharge 05/01/2022 1306 by Raelyn Number, RN Outcome: Progressing   Problem: Nutritional: Goal: Maintenance of adequate nutrition will improve 05/01/2022 1306 by Raelyn Number, RN Outcome: Adequate for Discharge 05/01/2022 1306 by Raelyn Number, RN Outcome: Progressing Goal: Progress toward achieving an optimal weight will improve 05/01/2022 1306 by Raelyn Number, RN Outcome: Adequate for Discharge 05/01/2022 1306 by Raelyn Number, RN Outcome: Progressing   Problem: Skin Integrity: Goal: Risk for impaired skin integrity will decrease 05/01/2022 1306 by Raelyn Number, RN Outcome: Adequate for Discharge 05/01/2022 1306 by Raelyn Number, RN Outcome: Progressing   Problem: Tissue Perfusion: Goal: Adequacy of tissue perfusion will improve 05/01/2022 1306 by Raelyn Number, RN Outcome: Adequate for Discharge 05/01/2022 1306 by Raelyn Number, RN Outcome: Progressing

## 2022-05-02 ENCOUNTER — Other Ambulatory Visit (HOSPITAL_COMMUNITY): Payer: Self-pay

## 2022-05-04 ENCOUNTER — Telehealth: Payer: Self-pay

## 2022-05-05 ENCOUNTER — Ambulatory Visit: Payer: BC Managed Care – PPO | Admitting: Internal Medicine

## 2022-05-05 NOTE — Progress Notes (Deleted)
Cardiology Office Note:    Date:  05/05/2022   ID:  Gabrielle [name], DOB 09-29-1956, MRN [id number]  PCP:  Dorna Mai, MD   [facility] Cardiologist:  Janina Mayo, MD     Referring MD: Dorna Mai, MD   No chief complaint on file. Pre-operative clearance  History of Present Illness:    Gabrielle [name] is a 66 y.o. female with a hx of DM2, HTN, PAD, former smoker, rectal CA, who was referred for pre-operative clearance for aortobifemoral bypass   She was seen by vascular surgery. She saw Dr. Virl Cagey and she noted: "Over the last several months she is appreciated significant claudication symptoms in bilateral lower extremities that is affected her time at work.  She has difficulty ambulating from her desk to the restroom.  At her home, she limits activity to forego pain in bilateral calves.     Since her initial presentation, her bilateral lower extremity pain has continued to worsen.  She now has rest pain in the right leg, keeping her up at night.  Basic over-the-counter medications are not working to alleviate the pain she is unable to sleep, and it is affecting her work.  At work, she has severe bilateral lower extremity claudication, which is hampering her productivity.  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  With symptoms at rest this is concerning for chronic critical limb ischemia. She was referred to cardiology for a pre-operative evaluation  She has no heart disease hx. She has not seen a cardiologist. She had a stress test in the past, it was presumably normal.   She can walk up one flight of stairs without SOB or chest pain. No recent hospitalizations. No  palpitations. No syncope. No orthopnea or PND. No LE edema.  She smoked for approximately 10 years and quit in 2008. 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 Family hx of diabetes  Interim Hx 05/05/2022: She underwent aortobifemoral bypass grafting with a hemashield gold graft as well as embolectomy BL femoral and L angioplasty   Past Medical History:  Diagnosis Date   Cancer ([place]) 2010   rectal CA   Cataract    Diabetes mellitus without complication ([place])    Phreesia 11/15/2020   Eczema    Hyperlipidemia    Hypertension    Intrinsic (urethral) sphincter deficiency (ISD)    RBBB    SUI (stress urinary incontinence, female)    hx of   Type 2 diabetes mellitus ([place])     Past Surgical History:  Procedure Laterality Date   ABDOMINAL HYSTERECTOMY  1998   fibroids, partial   ABDOMINOPLASTY  1999   ANGIOPLASTY Left 03/21/2022   Procedure: ANGIOPLASTY USING XENOSURE BIOLOGLIC PATCH ([id number]);  Surgeon: Broadus John, MD;  Location: [facility];  Service: Vascular;  Laterality: Left;   AORTA - BILATERAL FEMORAL ARTERY BYPASS GRAFT N/A 03/21/2022   Procedure: AORTOBIFEMORAL BYPASS GRAFT USING HEMASHIELD GOLD GRAFT (14x52m);  Surgeon: RBroadus John MD;  [place] [facility]  Service: Vascular;  Laterality: N/A;   BLADDER SUSPENSION N/A 03/06/2014   [facility] ;  Surgeon: JIrine Seal MD;  [facility]  Service: Urology;  Laterality:  N/A;   CARDIOVASCULAR STRESS TEST  03-27-2008   NORMAL PERFUSION STUDY/  EF 54%   COLON SURGERY  2010   for rectal ca   COLONOSCOPY     ENDARTERECTOMY FEMORAL Bilateral 03/21/2022   Procedure: ENDARTERECTOMY FEMORAL BILATERAL;  Surgeon: Broadus John, MD;  Location: [facility];  Service: Vascular;  Laterality: Bilateral;   EUS N/A 03/02/2017   Procedure: LOWER ENDOSCOPIC ULTRASOUND (EUS);  Surgeon: Milus Banister, MD;  Location[facility];  Service: Endoscopy;  Laterality: N/A;   EXCISION LOWER ABDOMINAL  SCAR POST ABDOMINOPLASTY  01-05-2000   ORIF RIGHT ANKLE FX  12-24-2008   RIGHT ANKLE ARTHROSCOPY W/ DEBRIDEMENT AND OPEN REMOVAL HARDWARE  03-12-2010   TRANSTHORACIC ECHOCARDIOGRAM  08-05-2009   MILD LVH/  EF 60-65%   TUBAL LIGATION      Current Medications: No outpatient medications have been marked as taking for the 05/05/22 encounter (Appointment) with Janina Mayo, MD.     Allergies:   Patient has no known allergies.   Social History   Socioeconomic History   Marital status: Married    Spouse name: Not on file   Number of children: 1   Years of education: Not on file   Highest education level: Not on file  Occupational History   Not on file  Tobacco Use   Smoking status: Former    Years: 20.00    Types: Cigarettes    Quit date: 11/14/2006    Years since quitting: 15.4   Smokeless tobacco: Never  Vaping Use   Vaping Use: Never used  Substance and Sexual Activity   Alcohol use: No   Drug use: No   Sexual activity: Not on file  Other Topics Concern   Not on file  Social History Narrative   Not on file   Social Determinants of Health   Financial Resource Strain: Not on file  Food Insecurity: Not on file  Transportation Needs: Not on file  Physical Activity: Not on file  Stress: Not on file  Social Connections: Not on file     Family History: The patient's family history includes Diabetes in her brother, mother, and sister; Hypertension in her brother, mother, and sister; Lupus in her daughter. There is no history of Colon cancer, Esophageal cancer, Rectal cancer, Stomach cancer, or Colon polyps.  ROS:   Please see the history of present illness.     All other systems reviewed and are negative.  EKGs/Labs/Other Studies Reviewed:    The following studies were reviewed today:   EKG:  EKG is  ordered today.  The ekg ordered today demonstrates   NSR, RBBB ( benign)  Recent Labs: 03/30/2022: B Natriuretic Peptide 559.7 04/16/2022: Magnesium 2.0 04/28/2022:  ALT 40 05/01/2022: BUN 20; Creatinine, Ser 0.64; Hemoglobin 9.3; Platelets 367; Potassium 3.5; Sodium 137  Recent Lipid Panel    Component Value Date/Time   CHOL 84 03/24/2022 0843   CHOL 170 03/22/2021 0916   TRIG 157 (H) 04/07/2022 0400   HDL 13 (L) 03/24/2022 0843   HDL 29 (L) 03/22/2021 0916   CHOLHDL 6.5 03/24/2022 0843   VLDL UNABLE TO CALCULATE IF TRIGLYCERIDE OVER 400 mg/dL 03/24/2022 0843   LDLCALC UNABLE TO CALCULATE IF TRIGLYCERIDE OVER 400 mg/dL 03/24/2022 0843   LDLCALC 117 (H) 03/22/2021 0916   LDLDIRECT 20.4 03/24/2022 0843     Risk Assessment/Calculations:           Physical Exam:    VS:   There were no vitals filed  for this visit.    Wt Readings from Last 3 Encounters:  04/29/22 166 lb 7.2 oz (75.5 kg)  03/14/22 118 lb (53.5 kg)  02/23/22 120 lb 9.6 oz (54.7 kg)     GEN:  Well nourished, well developed in no acute distress HEENT: Normal NECK: No JVD; No carotid bruits LYMPHATICS: No lymphadenopathy CARDIAC: RRR, no murmurs, rubs, gallops RESPIRATORY:  Clear to auscultation without rales, wheezing or rhonchi  ABDOMEN: Soft, non-tender, non-distended MUSCULOSKELETAL:  No edema; No deformity  SKIN: Warm and dry NEUROLOGIC:  Alert and oriented x 3 PSYCHIATRIC:  Normal affect   ASSESSMENT:    Pre-Op: Mrs. Ramaswamy presents for pre-op. She has CAD equivalent disease.  Her EKG is benign. She can walk > 4 METS without shortness of breath or chest pressure.  She is low to intermediate risk for intermediate risk surgery. She is acceptable cardiac risk for intermediate risk procedure, aortobifemoral bypass. No plans for further ischemic evaluation. Continue statin, aspirin. Blood pressure is in good control. Continue lisinopril and amlodipine. Continue farxiga.   #PAD: continue asa 81 mg daily, crestor 40 mg daily. LDL goal < 70 mg/dL Triglycerides were too high on her last lipid panel. Will start fenofibrate and recheck in 6 weeks  #HTN: continue  carvediolol 25 mg BID  T2DM: she was on farxiga   PLAN:    In order of problems listed above:  Fasting lipids         Medication Adjustments/Labs and Tests Ordered: Current medicines are reviewed at length with the patient today.  Concerns regarding medicines are outlined above.  No orders of the defined types were placed in this encounter.  No orders of the defined types were placed in this encounter.   There are no Patient Instructions on file for this visit.   Signed, Janina Mayo, MD  05/05/2022 8:14 AM    [place]

## 2022-05-11 ENCOUNTER — Encounter: Payer: Self-pay | Admitting: Internal Medicine

## 2022-05-11 LAB — GLUCOSE, CAPILLARY: Glucose-Capillary: 22 mg/dL — CL (ref 70–99)

## 2022-05-20 NOTE — Telephone Encounter (Signed)
No return call to schedule overdue post-op.

## 2022-06-15 ENCOUNTER — Encounter: Payer: Self-pay | Admitting: Vascular Surgery

## 2022-06-15 NOTE — Telephone Encounter (Signed)
3rd time contacting patient will be sending letter.

## 2022-06-21 ENCOUNTER — Telehealth: Payer: Self-pay

## 2022-06-21 NOTE — Telephone Encounter (Addendum)
Pt's husband, [name], called stating that the pt was having "a lot of trouble".  Reviewed pt's chart, returned call, no answer, lf vm.  Pt's husband returned call. He specifically requested that Dr Virl Cagey call him personally. He would not give any additional information when asked. He stated that Dr Virl Cagey knew the situation and would call him. Informed him that he was not back in the office until Friday, but a staff msg would be sent. Confirmed understanding.

## 2022-06-22 ENCOUNTER — Telehealth: Payer: Self-pay | Admitting: Family Medicine

## 2022-06-22 NOTE — Telephone Encounter (Signed)
[facility] called on behalf of Hospice.   PEC stated that Hospice needed to know will Dr. Redmond Pulling sign as attending provider for pt above. [facility] also inquired for Hospice will Dr. Redmond Pulling approve for Hospice to do comfort care for pt.   Spoke to Dr. Redmond Pulling and her nurse, Dr. Redmond Pulling did agree to sign off as attending provider and approve for Hospice to provide comfort care.

## 2022-06-28 ENCOUNTER — Telehealth: Payer: Self-pay | Admitting: Family Medicine

## 2022-06-28 NOTE — Telephone Encounter (Signed)
Please advise 

## 2022-06-28 NOTE — Telephone Encounter (Signed)
[facility] called to report that patient is not sleeping at night, she needs something that can be crushed or is liquid because the patient is on tubes.  Best contact: [phone]

## 2022-06-29 NOTE — Telephone Encounter (Signed)
The msg below was given to Sam at [facility]

## 2022-08-12 ENCOUNTER — Telehealth: Payer: Self-pay | Admitting: Family Medicine

## 2022-08-12 NOTE — Telephone Encounter (Signed)
Place in provider folder

## 2022-08-12 NOTE — Telephone Encounter (Signed)
Late pt's spouse (Number confirmed) dropped off paperwork that he needs Dr. Redmond Pulling to complete regarding Corra's passing 08/01/2022.  Placed in provider's bin.   Please advise and thank you

## 2022-08-14 DEATH — deceased

## 2023-08-07 IMAGING — DX DG ABD PORTABLE 1V
1 series · 1 of 1 positions shown · non-contrast
Comparison: 04/07/2022.

CLINICAL DATA: Feeding tube placement.

EXAM:
PORTABLE ABDOMEN - 1 VIEW

[abdomen]
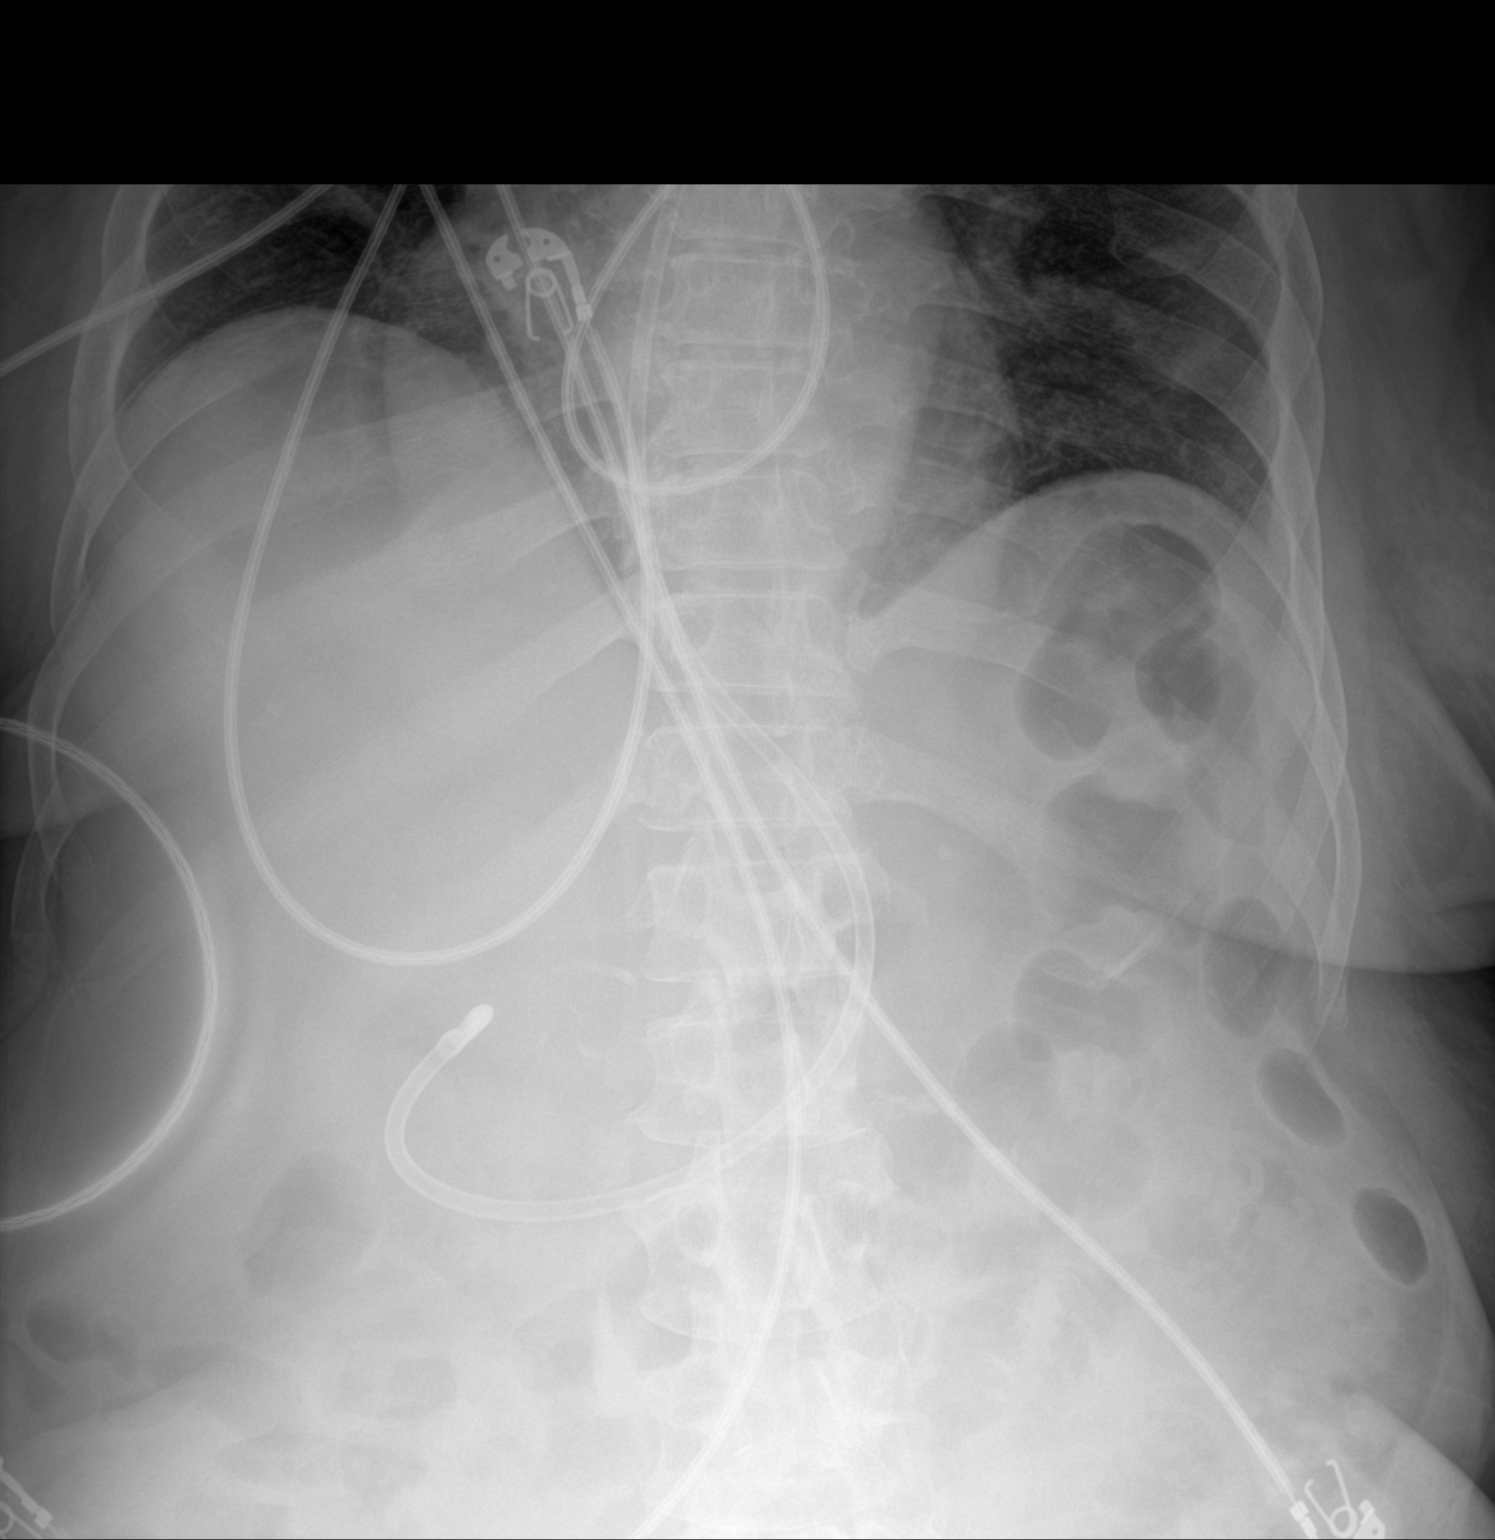

[1 of 1 positions shown; findings below may reference images not displayed]

FINDINGS: Feeding tube is at the pylorus or within the duodenal bulb.
IMPRESSION: The tube is at the pylorus or duodenal bulb.
